# Patient Record
Sex: Female | Born: 1940
Health system: Southern US, Community
[De-identification: ages and names within clinical notes are randomized; demographics above are authoritative.]

## PROBLEM LIST (undated history)

## (undated) DIAGNOSIS — E785 Hyperlipidemia, unspecified: Secondary | ICD-10-CM

## (undated) DIAGNOSIS — N3289 Other specified disorders of bladder: Secondary | ICD-10-CM

## (undated) DIAGNOSIS — I10 Essential (primary) hypertension: Secondary | ICD-10-CM

## (undated) DIAGNOSIS — T8859XA Other complications of anesthesia, initial encounter: Secondary | ICD-10-CM

## (undated) DIAGNOSIS — C801 Malignant (primary) neoplasm, unspecified: Secondary | ICD-10-CM

## (undated) DIAGNOSIS — T4145XA Adverse effect of unspecified anesthetic, initial encounter: Secondary | ICD-10-CM

## (undated) DIAGNOSIS — M6530 Trigger finger, unspecified finger: Secondary | ICD-10-CM

## (undated) DIAGNOSIS — E039 Hypothyroidism, unspecified: Secondary | ICD-10-CM

## (undated) DIAGNOSIS — K219 Gastro-esophageal reflux disease without esophagitis: Secondary | ICD-10-CM

## (undated) DIAGNOSIS — Z973 Presence of spectacles and contact lenses: Secondary | ICD-10-CM

## (undated) DIAGNOSIS — H3530 Unspecified macular degeneration: Secondary | ICD-10-CM

## (undated) DIAGNOSIS — M1990 Unspecified osteoarthritis, unspecified site: Secondary | ICD-10-CM

## (undated) HISTORY — PX: TONSILLECTOMY: SUR1361

## (undated) HISTORY — PX: APPENDECTOMY: SHX54

## (undated) HISTORY — PX: CARPAL TUNNEL RELEASE: SHX101

## (undated) HISTORY — PX: OTHER SURGICAL HISTORY: SHX169

## (undated) HISTORY — PX: JOINT REPLACEMENT: SHX530

---

## 1987-09-07 HISTORY — PX: ABDOMINAL HYSTERECTOMY: SHX81

## 1992-09-06 HISTORY — PX: THYROIDECTOMY: SHX17

## 1995-09-07 HISTORY — PX: CHOLECYSTECTOMY: SHX55

## 2004-02-17 ENCOUNTER — Ambulatory Visit (HOSPITAL_COMMUNITY): Admission: RE | Admit: 2004-02-17 | Discharge: 2004-02-17 | Payer: Self-pay | Admitting: Family Medicine

## 2004-09-06 HISTORY — PX: TOTAL HIP ARTHROPLASTY: SHX124

## 2004-09-06 HISTORY — PX: CARDIAC CATHETERIZATION: SHX172

## 2005-02-11 ENCOUNTER — Ambulatory Visit: Admission: RE | Admit: 2005-02-11 | Discharge: 2005-02-11 | Payer: Self-pay | Admitting: Orthopedic Surgery

## 2005-02-16 ENCOUNTER — Ambulatory Visit (HOSPITAL_COMMUNITY): Admission: RE | Admit: 2005-02-16 | Discharge: 2005-02-16 | Payer: Self-pay | Admitting: Cardiology

## 2005-03-03 ENCOUNTER — Inpatient Hospital Stay (HOSPITAL_COMMUNITY): Admission: RE | Admit: 2005-03-03 | Discharge: 2005-03-06 | Payer: Self-pay | Admitting: Orthopedic Surgery

## 2005-03-04 ENCOUNTER — Ambulatory Visit: Payer: Self-pay | Admitting: Physical Medicine & Rehabilitation

## 2005-03-06 ENCOUNTER — Inpatient Hospital Stay
Admission: RE | Admit: 2005-03-06 | Discharge: 2005-03-17 | Payer: Self-pay | Admitting: Physical Medicine & Rehabilitation

## 2005-03-07 ENCOUNTER — Encounter (HOSPITAL_COMMUNITY)
Admission: RE | Admit: 2005-03-07 | Discharge: 2005-03-17 | Payer: Self-pay | Admitting: Physical Medicine & Rehabilitation

## 2005-06-07 ENCOUNTER — Ambulatory Visit: Payer: Self-pay | Admitting: Physical Medicine & Rehabilitation

## 2005-06-07 ENCOUNTER — Inpatient Hospital Stay (HOSPITAL_COMMUNITY): Admission: RE | Admit: 2005-06-07 | Discharge: 2005-06-10 | Payer: Self-pay | Admitting: Orthopedic Surgery

## 2005-06-10 ENCOUNTER — Inpatient Hospital Stay
Admission: RE | Admit: 2005-06-10 | Discharge: 2005-06-17 | Payer: Self-pay | Admitting: Physical Medicine & Rehabilitation

## 2005-06-11 ENCOUNTER — Encounter (HOSPITAL_COMMUNITY)
Admission: RE | Admit: 2005-06-11 | Discharge: 2005-06-17 | Payer: Self-pay | Admitting: Physical Medicine & Rehabilitation

## 2005-07-20 ENCOUNTER — Encounter (HOSPITAL_COMMUNITY): Admission: RE | Admit: 2005-07-20 | Discharge: 2005-08-19 | Payer: Self-pay | Admitting: Orthopedic Surgery

## 2005-08-24 ENCOUNTER — Encounter (HOSPITAL_COMMUNITY): Admission: RE | Admit: 2005-08-24 | Discharge: 2005-09-01 | Payer: Self-pay | Admitting: Orthopedic Surgery

## 2005-09-08 ENCOUNTER — Encounter (HOSPITAL_COMMUNITY): Admission: RE | Admit: 2005-09-08 | Discharge: 2005-09-08 | Payer: Self-pay | Admitting: Orthopedic Surgery

## 2005-11-08 ENCOUNTER — Ambulatory Visit (HOSPITAL_COMMUNITY): Admission: RE | Admit: 2005-11-08 | Discharge: 2005-11-08 | Payer: Self-pay | Admitting: Internal Medicine

## 2006-08-24 ENCOUNTER — Encounter (HOSPITAL_COMMUNITY): Admission: RE | Admit: 2006-08-24 | Discharge: 2006-09-05 | Payer: Self-pay | Admitting: Orthopedic Surgery

## 2006-09-07 ENCOUNTER — Encounter (HOSPITAL_COMMUNITY): Admission: RE | Admit: 2006-09-07 | Discharge: 2006-10-07 | Payer: Self-pay | Admitting: Orthopedic Surgery

## 2008-05-10 ENCOUNTER — Ambulatory Visit (HOSPITAL_COMMUNITY): Admission: RE | Admit: 2008-05-10 | Discharge: 2008-05-10 | Payer: Self-pay | Admitting: Family Medicine

## 2010-06-23 ENCOUNTER — Ambulatory Visit (HOSPITAL_COMMUNITY): Admission: RE | Admit: 2010-06-23 | Discharge: 2010-06-23 | Payer: Self-pay | Admitting: Family Medicine

## 2011-01-22 NOTE — Discharge Summary (Signed)
[name], [name]                ACCOUNT NO.:  [id number]   MEDICAL RECORD NO.:  [id number]          PATIENT TYPE:  INP   LOCATION:  1517                         FACILITY:  [facility]   PHYSICIAN:  Ollen Gross, M.D.    DATE OF BIRTH:  1941/02/25   DATE OF ADMISSION:  03/03/2005  DATE OF DISCHARGE:  03/06/2005                                 DISCHARGE SUMMARY   ADMISSION DIAGNOSIS:  1.  Osteoarthritis of the right hip.  2.  Hypertension.  3.  Hypothyroidism secondary to thyroidectomy.  4.  Hyperlipidemia.  5.  Basal cell skin carcinoma.  6.  Varicose veins.  7.  Hemorrhoids.  8.  Urinary incontinence.  9.  Osteoarthritis.  10. Post menopausal.  11. Uterine fibroids.  12. Benign cystic breast disease.   DISCHARGE DIAGNOSIS:  1.  Osteoarthritis of the right hip status post right total hip      arthroplasty.  2.  Mild postop blood loss anemia.  3.  Hypertension.  4.  Hypothyroidism secondary to thyroidectomy.  5.  Hyperlipidemia.  6.  Basal cell skin carcinoma.  7.  Varicose veins.  8.  Hemorrhoids.  9.  Urinary incontinence.  10. Osteoarthritis.  11. Post menopausal.  12. Uterine fibroids.  13. Benign cystic breast disease.   PROCEDURE:  Right total hip arthroplasty, surgeon Dr. Ollen Gross,  assistant Avel Peace, P.A.-C., anesthesia general, estimated blood loss  600 mL, Hemovac drain x 1.   BRIEF HISTORY:  [name] is a 70 year old female with severe end stage  arthritis of the right hip with intractable pain who now presents for total  hip.   HOSPITAL COURSE:  The patient was admitted to the hospital and underwent the  procedure, tolerated it well.  She was started on PC and p.o. analgesics for  pain control following surgery.  On day one, she did have some discomfort,  had already been sitting on the side of the bed.  Hemovac drain placed  during the surgery was pulled.  Rehab consult.  The patient was seen and  felt to be a perfect candidate for inpatient  rehab.  She was going to be  transferred at which time the bed was available.  She was starting to get up  with physical therapy.  By day two, she was already up to a chair and only  given short ambulation.  She ambulated just essentially bed to chair with  moderate assist.  The incision looked good, dressing was changed.  It was  decided she was a good candidate and she would be transferred when a bed  became available.  She continued to receive therapy and by day three, March 06, 2005, she was tolerating well, pain was under better control, she had  been weaned over to p.o. medications.  It was noted the bed became available  later that day and she was transferred over at that time.   DISCHARGE PLAN:  The patient was transferred to [facility].   DISCHARGE MEDICATIONS:  Continue current medications as per the [facility].  Continue previous diet.  Activities:  Partial weight-bearing right lower  extremity, continue gait training, ambulation, and ADLs as per PT and OT  while on rehab services.  May start showering four days after surgery.  Follow up two weeks from surgery, call the office for an appointment at [phone]  [phone], or after discharge from the [facility] unit.   DISPOSITION:  [place] [facility].   CONDITION ON DISCHARGE:  Improving.       ALP/MEDQ  D:  03/17/2005  T:  03/17/2005  Job:  [id number]   cc:   Ollen Gross, M.D.  [facility]  [address]  [address]  [place]  [place] [address]  Fax: [phone]   Ellwood Dense, M.D.  [address] [place]  [place] [address]  Fax: [phone]   Patrica Duel, M.D.  [address]  [place]  [place] [address]  Fax: [phone]   Darlin Priestly, MD  [phone] [address], [address]  [place]  [place] [address]  Fax: [phone]

## 2011-01-22 NOTE — H&P (Signed)
[name], [name]                ACCOUNT NO.:  [id number]   MEDICAL RECORD NO.:  [id number]          PATIENT TYPE:  INP   LOCATION:  NA                           FACILITY:  [facility]   PHYSICIAN:  Ollen Gross, M.D.    DATE OF BIRTH:  10/17/1940   DATE OF ADMISSION:  06/07/2005  DATE OF DISCHARGE:                                HISTORY & PHYSICAL   DATE OF OFFICE VISIT HISTORY AND PHYSICAL:  May 25, 2005   CHIEF COMPLAINT:  Left hip pain.   HISTORY OF PRESENT ILLNESS:  The patient is a 70 year old female well-known  to Dr. Ollen Gross, having previously undergone a right total hip  arthroplasty and had done extremely well, had known end-stage arthritis in  both hips and now presents for the opposite hip on the left side to be  completed.   ALLERGIES:  IV DYE, FELDENE, NSAIDS and VICODIN.   CURRENT MEDICATIONS:  Synthroid, atenolol, also vitamins including vitamin  C, vitamin E, beta carotene, calcium and glucosamine.   PAST MEDICAL HISTORY:  1.  Hypertension.  2.  Hypothyroidism secondary to thyroidectomy.  3.  Hyperlipidemia.  4.  Basal skin carcinoma.  5.  Varicose veins.  6.  Hemorrhoids.  7.  Urinary incontinence.  8.  Postmenopausal.  9.  Uterine fibroid.  10. Benign cystic breast disease.   PAST SURGICAL HISTORY:  1.  Tonsillectomy.  2.  Carpal tunnel surgery.  3.  Thyroidectomy.  4.  Gallbladder surgery.  5.  Right hip replacement in June of '06.  6.  Hysterectomy.   SOCIAL HISTORY:  She is single.  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   FAMILY HISTORY:  Father deceased at age 24 with a history of congestive  heart failure, heart and hypertension.  Mother with a history of  hypertension.  Father with a history of diverticulosis and arthritis with  both parents.   REVIEW OF SYSTEMS:  GENERAL:  No fevers, chills or night sweats.  NEUROLOGIC:  No seizures, syncope or paralysis.  RESPIRATORY:  No  shortness  of breath, productive cough or hemoptysis.  CARDIOVASCULAR:  No chest pain,  angina or orthopnea.  GI:  No nausea, vomiting, diarrhea or constipation.  GU:  A little bit of nocturia and frequency.  No dysuria, hematuria or  discharge.  MUSCULOSKELETAL:  Left hip as found in history of present  illness.   PHYSICAL EXAMINATION:  VITAL SIGNS:  Pulse 64, respirations 12, blood  pressure 120/60.  GENERAL:  A 70 year old white female, well-nourished, well-developed, in no  acute distress, overweight, alert, oriented and cooperative, very pleasant  at time of exam, and appears to be an excellent historian.  HEENT:  Normocephalic, atraumatic.  Pupils are round and reactive to light.  Oropharynx clear.  EOMs intact.  NECK:  Supple.  CHEST:  Clear, anterior and posterior chest wall, no rhonchi, rales or  wheezing.  HEART:  Regular rhythm. S1 and S2 noted.  ABDOMEN:  Soft and round abdomen.  Bowel sounds present.  Nontender.  RECTAL:  Not done, not pertinent to present illness.  BREASTS:  Not done, not pertinent to present illness.  GENITALIA:  Not done, not pertinent to present illness.  EXTREMITIES:  Left hip:  The left hip shows flexion to about 90 degrees.  There is no internal rotation and external rotation, only about 20 degrees  of abduction, slight analgesic gait.   IMPRESSION:  1.  Osteoarthritis of left hip.  2.  Hypertension.  3.  Hypothyroidism secondary to thyroidectomy.  4.  Hyperlipidemia.  5.  History of basal cell skin carcinoma.  6.  Varicose veins.  7.  Hemorrhoids.  8.  Urinary incontinence.  9.  Osteoarthritis.  10. Postmenopausal.  11. Uterine fibroid.  12. Benign cystic breast disease.   PLAN:  The patient will be admitted to [facility] to undergo a  left total hip replacement arthroplasty.  The patient has been seen  preoperatively by Dr. Patrica Duel and felt to be stable for up and coming  surgery.  The patient is subsequently admitted  to the hospital for  procedure.      [name], P.A.      Ollen Gross, M.D.  Electronically Signed    ALP/MEDQ  D:  06/06/2005  T:  06/07/2005  Job:  [id number]   cc:   Patrica Duel, M.D.  Fax: [phone]

## 2011-01-22 NOTE — H&P (Signed)
[name], [name]                ACCOUNT NO.:  [id number]   MEDICAL RECORD NO.:  [id number]          PATIENT TYPE:  INP   LOCATION:                               FACILITY:  [facility]   PHYSICIAN:  Ranelle Oyster, M.D.DATE OF BIRTH:  Feb 26, 1941   DATE OF ADMISSION:  03/06/2005  DATE OF DISCHARGE:                                HISTORY & PHYSICAL   CHIEF COMPLAINT:  Right hip pain.   HISTORY OF PRESENT ILLNESS:  This is a 70 year old white female admitted, on  March 03, 2005, with end stage arthritis in the right hip.  The patient had  elected to undergo a right total hip replacement which was performed on the  same day by Dr. Ollen Gross.  The patient was placed on Coumadin for DVT  prophylaxis and was weightbearing as tolerated after surgery.  The patient  was slow to progress forearm a functional standpoint and as of March 05, 2005, was mod assist with transfers and ambulating a few feet with a  standard walker using verbal cues.  It was felt that she could benefit from  an admission to the subacute rehab unit to achieve modified independent to  supervision goals.  No significant postoperative complications are noted in  the chart.   REVIEW OF SYSTEMS:  The patient denies any systemic complaints.  She has had  no problems with ears, nose, and throat, eyes, breathing, cardiovascular,  GU, skin, cognitive, or psychological issues.  She does complain of some  constipation.   PAST MEDICAL HISTORY:  1.  Hypertension.  2.  Hypothyroidism.  3.  Left carpal tunnel syndrome with release.  4.  Hysterectomy.  5.  Thyroidectomy.  6.  Cholecystectomy.  7.  History of coronary artery disease with PTCA.   The patient denies alcohol or tobacco use.   FAMILY HISTORY:  Noncontributory.   SOCIAL HISTORY:  The patient lives in a one-level home with a ramp.  She  lives with her mother in [place].  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  Mother and friends will be able to provide  some assistance at home,  after discharge there.  The patient was clearly independent with a cane  prior to arrival.   MEDICATIONS PRIOR TO ARRIVAL:  Aspirin, atenolol, and Synthroid.   ALLERGIES:  1.  VICODIN.  2.  SULFA.  3.  FELDENE/NSAID'S.  4.  NAPROXEN.  5.  CONTRAST MEDIA.   PHYSICAL EXAMINATION:  VITAL SIGNS:  Blood pressure is 94/50, pulse is 98,  respiratory rate is 20, temperature is 98.4.  GENERAL:  The patient is alert and oriented x 3.  EAR/NOSE/THROAT:  Unremarkable.  Oral mucosa is pink and moist.  NECK:  Supple without JVD or lymphadenopathy.  CHEST:  Clear to auscultation bilaterally.  HEART:  Regular rate and rhythm without murmurs, rubs, or gallops.  ABDOMEN:  Soft, nontender.  The patient is slightly obese.  SKIN:  Clean and intact.  Right hip wound has moderate serosanguineous  discharge but well approximated.  NEUROLOGIC:  Cranial nerves II-XII are grossly intact.  Reflexes  are 2+.  Sensation slightly decreased over the dorsum of the foot and the lateral  shin.  Judgment is appropriate.  The patient had good orientation and  memory.  EXTREMITIES:  The patient had trace to 1+ edema of the right lower extremity  today.  Strength was 5/5 both upper extremities.  Right lower extremity is  1+ to 2 out of 5 proximally, 3+ to 4 out of 5 distally.  Left lower  extremity  is 4 to 4+ out of 5 throughout.   ASSESSMENT/PLAN:  1.  Functional deficit secondary to osteoarthritis of the right hip, status      post right total hip replacement postoperative day number three.  Begin      subcu level rehabilitation to reach modified independent to supervision      goals.      1.  Estimated length of stay is 7-10 days.      2.  Prognosis is good.  2.  Deep vein thrombosis prophylaxis with Lovenox and Coumadin.  3.  Pain management with as needed Robaxin and Percocet.  4.  Hypothyroidism.  Continue Levothyroxine.  5.  Constipation.  Increase the patient's Senokot S.  Add  stool softeners      and laxatives as needed.  6.  Coronary history.  Continue atenolol 50 mg b.i.d.       ZTS/MEDQ  D:  03/05/2005  T:  03/05/2005  Job:  [id number]

## 2011-01-22 NOTE — H&P (Signed)
[name], [name]                ACCOUNT NO.:  [id number]   MEDICAL RECORD NO.:  [id number]          PATIENT TYPE:  INP   LOCATION:  NA                           FACILITY:  [facility]   PHYSICIAN:  Ollen Gross, M.D.    DATE OF BIRTH:  [birth year]   DATE OF ADMISSION:  02/15/2005  DATE OF DISCHARGE:                                HISTORY & PHYSICAL   CHIEF COMPLAINT:  Right hip pain.   HISTORY OF PRESENT ILLNESS:  A 70 year old female with known history of  bilateral knee and bilateral hip pain.  She has a longstanding history and  has quite dysfunction and gait abnormality for several years now.  She was  seen by a physician at [facility] and given some physical therapy for  some knee problems.  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  She spends a lot of time on mission trips in [place], where she  lives most of the time.  She has been told she has noticeable limp and does  quite a lot of walking.  She is seen in the office.  Her x-rays show that  she has moderate arthritic changes on the right and left still with some  preserved joint space and marginal osteophyte formation on the left.  Lateral views do not show any significant patella femoral involvement.  Both  hips, however, show severe end stage arthritis with deformity of the femoral  head and bone-on-bone on both sides with a large marginal osteophytes with  some collapse of the right femoral head.  It appears that her main problem  is mostly her hips and the right is more symptomatic than the left.  It is  felt due to her findings and symptoms that she would benefit from undergoing  hip replacement.  The risks and benefits discussed.  The patient is  subsequently admitted to the hospital.   ALLERGIES:  1.  FELDENE.  2.  VICODIN.  3.  NAPROXEN.  4.  SULFA DRUGS.   CURRENT MEDICATIONS:  1.  Atenolol 50 mg b.i.d.  2.  Synthroid 163 mcg daily.  3.  Vitamin C 1,000 mg daily.  4.  Vitamin E 1,000  international units daily.  5.  Betacarotene 25,000 international units.  6.  Calcium with vitamin D 500 mg twice a day.  7.  Glucosamine/chondroitin triple strength twice a day.  8.  Aspirin 81 mg daily.   PAST MEDICAL HISTORY:  1.  Hypertension.  2.  Hypothyroidism secondary to thyroidectomy.  3.  Hyperlipidemia.  4.  Basal cell skin carcinoma.  5.  Varicose veins.  6.  Hemorrhoids.  7.  Urinary incontinence.  8.  Osteoarthritis.  9.  Post menopausal.  10. Uterine fibroids.  11. Benign cystic breast disease.   PAST SURGICAL HISTORY:  1.  Tonsillectomy and adenoidectomy.  2.  Hysterectomy.  3.  Appendectomy.  4.  Carpal tunnel.  5.  Gallbladder.  6.  Thyroidectomy.  7.  Removal of basal cell carcinoma.   SOCIAL HISTORY:  Single.  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  Nonsmoker.  No alcohol.  No children.   FAMILY HISTORY:  Father deceased with a history of heart attack, age 60,  also with a history of hypertension and throat cancer.  Mother deceased  racing heart and also a history of hypertension.   REVIEW OF SYSTEMS:  GENERAL:  No fever, chills, night sweats.  NEURO:  No  seizures, syncope, paralysis.  RESPIRATORY:  A little bit of shortness of  breath on exertion.  No shortness of breath at rest, productive cough, or  hemoptysis.  CARDIOVASCULAR:  No chest pain, angina, orthopnea.  GI:  No  nausea, vomiting.  No blood or mucus in the stool.  GU:  She does have a  little bit of nocturia and frequency with some urgency, slight urinary  incontinence.  No dysuria, hematuria.  MUSCULOSKELETAL:  Right hip found in  the history of present illness.   PHYSICAL EXAMINATION:  VITAL SIGNS:  Pulse 72, respirations 12, blood  pressure 124/78.  GENERAL:  A 70 year old white female, well nourished, well developed, short  in stature, overweight, obese, no acute distress.  She is alert, oriented,  cooperative, very pleasant.  HEENT:  Normocephalic, atraumatic.  Pupils  round and reactive.  Oropharynx  is clear.  EOMs are intact.  NECK:  Supple.  No carotid bruits are appreciated.  CHEST:  Clear anterior posterior chest walls.  No rhonchi, rales, or  wheezing.  HEART:  Regular rhythm.  No murmur, S1 S2 noted.  ABDOMEN:  Soft, round, protuberant abdomen.  Bowel sounds present.  RECTAL/BREASTS/GENITALIA:  Not done.  Not pertinent to present illness.  EXTREMITIES:  Right hip:  Right hip flexion only shows 80 degrees.  There is  no internal rotation.  No external rotation.  No abduction.  Very limited  motion and function with right hip.   IMPRESSION:  Osteoarthritis, right hip.   PLAN:  The patient will be admitted to [facility] to undergo a  right total hip arthroplasty.  The patient has been seen preoperatively by  Dr. Jenne Campus, over at [facility], and felt to be stable  to undergo surgery.       ALP/MEDQ  D:  02/13/2005  T:  02/13/2005  Job:  [id number]   cc:   Patrica Duel, M.D.  [address]  [place]  [place] [address]  Fax: [phone]   Darlin Priestly, MD  [phone] [address], [address]  [place]  [place] [address]  Fax: [phone]   Ollen Gross, M.D.  [facility]  [address]  [address]  [place]  [place] [address]  Fax: [phone]

## 2011-01-22 NOTE — Cardiovascular Report (Signed)
[name], [name]                ACCOUNT NO.:  [id number]   MEDICAL RECORD NO.:  [id number]          PATIENT TYPE:  OIB   LOCATION:  2852                         FACILITY:  [facility]   PHYSICIAN:  Thereasa Solo. Little, M.D. DATE OF BIRTH:  1940-11-09   DATE OF PROCEDURE:  02/16/2005  DATE OF DISCHARGE:                              CARDIAC CATHETERIZATION   This 70 year old female underwent preop clearance for hip surgery. She had a  nuclear study performed that showed anterior apical ischemia. Because of  this, she is brought in for an outpatient cardiac catheterization.   After obtaining informed consent, the patient was prepped and draped in the  usual sterile fashion exposing the right groin. Following local anesthetic  of 1% Xylocaine, the Seldinger technique was employed, and  a 5-French  introducer sheath was placed in the right femoral artery. Left and right  coronary arteriography and ventriculography in the RAO projection was  performed.   COMPLICATIONS:  None.   TOTAL CONTRAST USED:  90 cc.   EQUIPMENT:  5-French Judkins _______________ catheters.   It took three attempts to enter the femoral artery. There was no hematoma  when the patient left the cath lab.   RESULTS:  1.  Hemodynamic monitoring:  Central aortic pressure was 186/92. Left      ventricular pressure was 190/20. At time of pullback, there was no      significant aortic valve gradient.  2.  Ventriculography. Ventriculography in the RAO projection revealed normal      LV systolic function. Ejection fraction was greater than 60%. Mitral      valve prolapse without mitral regurgitation was seen. Some ventricular      ectopy was seen during the ventriculogram. The left ventricular end-      diastolic pressure was 30.   CORONARY ARTERIOGRAPHY:  On fluoroscopy, no calcification was appreciated.   1.  Left main trifurcated and was free of disease.  2.  Circumflex. This gave rise to an OM vessel, and this system  was free of      disease.  3.  Optional diagonal. It bifurcated and was free of disease.  4.  LAD. The LAD extended down to the apex of the heart. There was a small      first diagonal branch and the system was free of disease.  5.  Right coronary artery. The right coronary gave rise to the PDA. The      system was free of disease.   CONCLUSION:  1.  Normal left ventricular systolic function  2.  Mitral valve prolapse.  3.  Systemic hypertension.  4.  No occlusive coronary disease.   DISCUSSION:  It appears that the stress test was false positive. This lady  is low risk for hip replacement. She is cleared for surgery, and we have  notified [facility] of the same.       ABL/MEDQ  D:  02/16/2005  T:  02/16/2005  Job:  [id number]   cc:   Jenne Campus, M.D.   Patrica Duel, M.D.  [address]  [place]  [place] [address]  Fax: [phone]   Ollen Gross, M.D.  [facility]  [address]  [address]  [place]  [place] [address]  Fax: [phone]

## 2011-01-22 NOTE — Op Note (Signed)
NAMETEMIMA, KUTSCH                ACCOUNT NO.:  [id number]   MEDICAL RECORD NO.:  [id number]          PATIENT TYPE:  INP   LOCATION:  0004                         FACILITY:  [facility]   PHYSICIAN:  Ollen Gross, M.D.    DATE OF BIRTH:  09-08-1940   DATE OF PROCEDURE:  03/03/2005  DATE OF DISCHARGE:                                 OPERATIVE REPORT   PREOPERATIVE DIAGNOSES:  Osteoarthritis right hip.   POSTOPERATIVE DIAGNOSES:  Osteoarthritis right hip.   PROCEDURE:  Right total hip arthroplasty.   SURGEON:  Ollen Gross, M.D.   ASSISTANT:  Avel Peace, P.A.-C.   ANESTHESIA:  General.   ESTIMATED BLOOD LOSS:  600.   DRAINS:  Hemovac x1.   COMPLICATIONS:  None.   CONDITION:  Stable to recovery.   BRIEF CLINICAL NOTE:  Ms. Suddeth is a 70 year old female with severe end-stage  osteoarthritis of the right hip with intractable pain. She presents now for  right total hip arthroplasty.   PROCEDURE IN DETAIL:  After successful administration of general anesthetic,  the patient is placed in the left lateral decubitus position with the right  side up and held with a hip positioner. The right lower extremity was  isolated from her perineum with plastic drapes and prepped and draped in the  usual sterile fashion. A standard posterolateral incision was made a 10  blade through the subcutaneous tissue to the level of fascia lata which was  incised in line with the skin incision. The sciatic nerve was palpated and  protected and the short external rotators isolated off the femur.  Capsulectomy was performed and the hip is dislocated. The center of the  femoral head is marked and a trial prosthesis placed such that the center of  the trial head corresponds to the center of her native femoral head.  Osteotomy line is marked on the femoral neck and osteotomy made with an  oscillating saw. Femoral head is removed and the femur retracted anteriorly  to gain acetabular exposure.   Acetabular  labrum and osteophytes are removed. Reaming starts at 45 mm  coursing in increments of 2 to 49 mm and then a 58 mm pinnacle acetabular  shell was placed in anatomic position and transfixed with two dome screws. A  trial 28 mm neutral liner was placed.   The femur was repaired first with the canal finder and then irrigation.  Axial reaming is performed to 3.5 mm, proximal reaming to an 18D and the  sleeve machined to a large. The 18D large trial sleeve is placed with an 18  x 13 stem and a 36 plus 8 neck matching her native anteversion. A 28 plus 0  head was placed. The hip is reduced with outstanding stability, full  extension, full external rotation, 70 degrees flexion, 40 degrees adduction,  90 degrees internal rotation and 90 degrees flexion, 70 degrees internal  rotation. When placing the right leg on top of the left, the lengths are  essentially equal. The hip was then dislocated and all trials were removed.  Permanent apex hole eliminator was placed into the  acetabular shell and then  the permanent 28 mm neutral Ultramet metal liner was placed. This is a metal-  on-metal hip replacement. Permanent 18D large sleeve is placed with the 18 x  13 stem and a 36 plus 8 neck. This matched her native anteversion. The  permanent 28 plus 0 head is placed and the hip is reduced with the same  stability parameters. The wound was copiously irrigated with saline solution  and the short rotators reattached to the femur through drill holes. The  fascia lata was closed over a Hemovac drain with interrupted #1 Vicryl,  subcu closed with #1 and then 2-0 Vicryl and subcuticular with running 4-0  Monocryl. Incisions cleaned and dried and Steri-Strips and a bulky sterile  dressing applied. Drains hooked to suction. She is placed into a knee  immobilizer, awakened and transported to recovery in stable condition.       FA/MEDQ  D:  03/03/2005  T:  03/03/2005  Job:  [id number]

## 2011-01-22 NOTE — Discharge Summary (Signed)
NAMESUHAILAH, Mccormick                ACCOUNT NO.:  [id number]   MEDICAL RECORD NO.:  [id number]          PATIENT TYPE:  INP   LOCATION:  1518                         FACILITY[facility]  [facility]   PHYSICIAN:  Ollen Gross, M.D.    DATE OF BIRTH:  [birth year]   DATE OF ADMISSION:  06/07/2005  DATE OF DISCHARGE:  06/10/2005                                 DISCHARGE SUMMARY   ADMITTING DIAGNOSES:  1.  Osteoarthritis, left hip.  2.  Hypertension.  3.  Hypothyroidism secondary to thyroidectomy.  4.  Hyperlipidemia.  5.  History of basal cell skin carcinoma.  6.  Varicose veins.  7.  Hemorrhoids.  8.  Urinary incontinence.  9.  Osteoarthritis.  10. Post menopausal.  11. Uterine fibroids.  12. Benign cyst breast disease.   DISCHARGE DIAGNOSES:  1.  Osteoarthritis - left hip, status post left total hip arthroplasty.  2.  Mild postoperative hyponatremia.  3.  Hypertension.  4.  Hypothyroidism secondary to thyroidectomy.  5.  Hyperlipidemia.  6.  History of basal cell skin carcinoma.  7.  Varicose veins.  8.  Hemorrhoids.  9.  Urinary incontinence.  10. Osteoarthritis.  11. Post menopausal.  12. Uterine fibroids.  13. Benign cyst breast disease.   DATE OF SURGERY:  June 07, 2005.   PROCEDURE:  Left total hip.   SURGEON:  Ollen Gross, M.D.   ASSISTANT:  Alexzandrew L. Perkins, P.A.C.   ANESTHESIA:  General.   Hemovac drain x1.   BRIEF HISTORY:  [name] is a 70 year old female with end stage arthritis of  the left hip with intractable pain.  Successful right total hip, now  presents for left total hip.   CONSULTATIONS:  Rehab services.   LABORATORY DATA:  Pre-op CBC, hemoglobin 13.4, hematocrit of 39.7, white  cell count normal, differential normal with the exception of elevated monos  of 13.  Post-op hemoglobin 11.3, last known H&H 10.9 and 31.7.  PT PTT 13.8  and 30 respectively.  Serial pro times followed, last known PT INR 17.7 and  1.4.  Chem panel on admission all  within normal limits.  Sodium dropped from  138 to 134.  Pre-op UA trace leukocyte esterase, 0-2 white cells, otherwise  negative.  Blood group type O negative.  Left hip films, pre-op, June 02, 2005, marked degenerative arthritic changes left hip.   HOSPITAL COURSE:  Admitted to [facility], tolerated procedure  well, later transferred to the recovery room orthopedic floor, seen by rehab  services postoperative, felt that she would be a candidate for SACU versus  rehab stay.  Day one, she was doing fairly well, wanted to go to rehab.  Hemovac drain was pulled.  Hemoglobin looked good.  Started getting up with  physical therapy.  By day two she was not feeling well, had some nausea that  past evening, given antiemetics with some benefit, have a very small bowel  movement.  Hemoglobin was 10.5.  Slowly progressed on physical therapy.  Ambulated approximately four feet and progressing slowly.  By day three, she  was doing a little  bit better.  The nausea had resolved.  She was tolerating  her meds.  It was noted a bed was available on the subacute unit.  She was  slowly progressing and felt to be an excellent candidate and felt due to  this reason she was transferred over at that time.   DISCHARGE PLAN:  The patient was discharged over to [facility] on  June 10, 2005.   DISCHARGE DIAGNOSES:  Please see above.   DISCHARGE MEDICATIONS:  Please continue current medications as per the [facility]  which will be sent over with the patient.   DIET:  Low sodium, low cholesterol diet.   ACTIVITY:  Partial weightbearing, 25-50% left lower extremity, gait  training, ambulation, ADLs, total hip precautions, total hip protocol.   FOLLOWUP:  Two weeks from surgery or following discharge from the subacute  unit.   DISPOSITION:  [facility] SACU.   CONDITION ON DISCHARGE:  Improving.      Alexzandrew L. Julien Girt, P.A.      Ollen Gross, M.D.  Electronically  Signed    ALP/MEDQ  D:  07/31/2005  T:  07/31/2005  Job:  [id number]   cc:   Ollen Gross, M.D.  Fax: [phone]   Patrica Duel, M.D.  Fax: [phone]

## 2011-01-22 NOTE — H&P (Signed)
Mccormick, Abigail                ACCOUNT NO.:  [id number]   MEDICAL RECORD NO.:  [id number]          PATIENT TYPE:  INP   LOCATION:  1517                         FACILITY:  [facility]   PHYSICIAN:  Ollen Gross, M.D.    DATE OF BIRTH:  Oct 28, 1940   DATE OF ADMISSION:  03/03/2005  DATE OF DISCHARGE:  03/06/2005                                HISTORY & PHYSICAL   ADDENDUM  [name] history is well documented in the previous dictated History and  Physical. She was originally going to be admitted on February 15, 2005 but had  to have some further workup which came about from her preoperative  evaluation. She has since undergone her workup and has been cleared from a  medical and cardiac standpoint. Since clearance has been obtained, she is  rescheduled and admitted for a right total hip on March 03, 2005. Please see  the previously-dictated History and Physical for medications, medical and  surgical history.   PHYSICAL EXAMINATION:  Unchanged.   IMPRESSION:  1.  Osteoarthritis right hip.  2.  Hypertension.  3.  Hypothyroidism secondary to thyroidectomy.  4.  Hyperlipidemia.  5.  Basal cell skin carcinoma.  6.  Varicose veins.  7.  Hemorrhoids.  8.  Urinary incontinence.  9.  Osteoarthritis.  10. Postmenopausal.  11. Uterine fibroids.  12. Benign cystic breast disease.   PLAN:  The patient is subsequently admitted to [facility] to undergo right  total hip arthroplasty. The patient is subsequently admitted for surgery by  Dr. Lequita Halt.       ALP/MEDQ  D:  03/17/2005  T:  03/17/2005  Job:  [id number]

## 2011-01-22 NOTE — Discharge Summary (Signed)
[name], [name]                ACCOUNT NO.:  [id number]   MEDICAL RECORD NO.:  [id number]          PATIENT TYPE:  ORB   LOCATION:  4529                         FACILITY:  [facility]   PHYSICIAN:  Ranelle Oyster, M.D.DATE OF BIRTH:  1940/12/05   DATE OF ADMISSION:  03/06/2005  DATE OF DISCHARGE:  03/17/2005                                 DISCHARGE SUMMARY   DISCHARGE DIAGNOSES:  1.  Right total hip arthroplasty secondary to osteoarthritis on June 28.  2.  Pain management.  3.  Coumadin for deep venous thrombosis prophylaxis.  4.  Postoperative anemia.  5.  Hypertension.  6.  Hypothyroidism.   HISTORY OF PRESENT ILLNESS:  This is a 70 year old female admitted to [facility] on June 28, with end-stage changes of the right hip and no  relief with conservative care.  She underwent a right total hip arthroplasty  on June 28, per Dr. Lequita Halt.  She was placed on Coumadin for deep venous  thrombosis prophylaxis and partial weightbearing.  Postoperative pain  control.  Hospital course uneventful.   PAST MEDICAL HISTORY:  See discharge diagnoses.   ALLERGIES:  FELDENE, VICODIN, NONSTEROIDAL ANTI-INFLAMMATORY DRUGS,  INTRAVENOUS CONTRAST, SULFA.   SOCIAL HISTORY:  Lives with elderly mother in [place].  She is a  IT sales professional traveler.  One-level home with a ramp.  Plan is to go home with  her mother.   MEDICATIONS:  Aspirin, Atenolol and Synthroid prior to admission.   HOSPITAL COURSE:  The patient did well while on rehabilitation services with  therapies initiated daily.  The following issues were followed during  patient's rehabilitation course:  Pertaining to Ms. Pettinger's total hip  arthroplasty, the surgical site is healing nicely with no signs of  infection.  She was ambulating with supervision level.  She remained on  Coumadin therapy for deep venous thrombosis prophylaxis.  She would complete  Coumadin protocol followed by [place].  Postoperative anemia remained  stable  with no bleeding episodes.  Her blood pressure is controlled with diastolic  pressures of 60-72.  She would follow up with her primary doctor, Dr.  Nobie Putnam.  She had no bowel or bladder disturbances.  She remained on her  Synthroid for hypothyroidism.  Overall, her strength and endurance greatly  improved.  She was encouraged with her overall progress and discharged to  home.   DISCHARGE MEDICATIONS:  1.  Coumadin with dose to be established at time of discharge to be      completed on April 02, 2005.  2.  Tenormin 50 mg twice daily.  3.  Synthroid 88 mcg plus 75 mcg daily.  4.  Oxycodone as needed for pain.  5.  Trinsicon capsule twice daily.   ACTIVITY:  As tolerated.   DIET:  Regular.   SPECIAL INSTRUCTIONS:  Continue therapies as per rehabilitation services.  Home health with Genevieve Norlander to complete Coumadin protocol.   FOLLOW UP:  Follow up with Dr. Homero Fellers Aluisio.       DA/MEDQ  D:  03/12/2005  T:  03/12/2005  Job:  [id number]   cc:  Patrica Duel, M.D.  [address], [address]  [place]  [place] [address]  Fax: [phone]

## 2011-01-22 NOTE — Op Note (Signed)
NAMESHYNIA, DALEO                ACCOUNT NO.:  [id number]   MEDICAL RECORD NO.:  [id number]          PATIENT TYPE:  INP   LOCATION:  NA                           FACILITY:  [facility]   PHYSICIAN:  Ollen Gross, M.D.    DATE OF BIRTH:  01/16/1941   DATE OF PROCEDURE:  06/07/2005  DATE OF DISCHARGE:                                 OPERATIVE REPORT   PREOPERATIVE DIAGNOSES:  Osteoarthritis, left hip.   POSTOPERATIVE DIAGNOSES:  Osteoarthritis, left hip.   PROCEDURE:  Left total hip arthroplasty.   SURGEON:  Ollen Gross, M.D.   ASSISTANT:  Avel Peace, P.A.-C.   ANESTHESIA:  General.   ESTIMATED BLOOD LOSS:  300.   DRAINS:  Hemovac x1.   COMPLICATIONS:  None.   CONDITION:  Stable to recovery.   BRIEF CLINICAL NOTE:  Ms. Barbaro is a 70 year old female with end-stage  arthritis of the left hip with intractable pain. She recently had a  successful right total hip arthroplasty and presents now for left total hip  arthroplasty.   DESCRIPTION OF PROCEDURE:  After successful administration of general  anesthetic, the patient was placed in the right lateral decubitus position  with the left side up and held with a hip positioner. The left lower  extremity was isolated from her perineum with plastic drapes and prepped and  draped in the usual sterile fashion. A standard posterolateral incision was  made with a 10 blade through the subcutaneous tissue to the level of the  fascia lata which was incised in line with the skin incision. The sciatic  nerve was palpated and protected and short external rotators isolated off  the femur. Capsulectomy  was performed and the hip is dislocated. A trial  prosthesis is placed such that the center of the trial head corresponds to  the center of her native femoral head. The osteotomy line is marked on the  femoral neck and osteotomy made with an oscillating saw. The femoral head is  removed and then the femur retracted anteriorly to gain  acetabular exposure.   Acetabular labrum and mass of osteophytes are removed. We began reaming at  45 coursing in increments of 2 up to 49 mm and then a 50 mm pinnacle  acetabular shell is placed in anatomic position and transfixed with two dome  screws each of which had excellent purchase. A trial 28 mm neutral liner was  placed.   The femur is prepared first with a canal finder and then irrigation. Axial  reaming is performed up to 13.5 mm, proximal reaming to an 18D and the  sleeve machined to a large. An 18D large trial sleeve is placed and an 18 x  13 stem and a 36 plus 8 neck. A trial 28 plus 0 head is placed. Her neck was  about 10 degrees retroverted in relationship to her native version. Her  native version was about 35 degrees. The hip is reduced with excellent  stability. She had full extension and full external rotation, 70 degrees  flexion, 40 degrees adduction, 90 degrees internal rotation, 90 degrees  flexion and  70 degrees of internal rotation. By placing the left leg on top  of the right, she was still a few millimeters short so we went a 28 plus 3  head which corrected that and still had great stability. All the trials were  then removed and the permanent apex hole eliminator was placed into the  acetabular shell. The permanent 28 mm neutral Ultamet metal liner was  placed. This is a metal on metal hip replacement. The 18D large  sleeve was  placed on the proximal femur with the 18 x 13 stem and a 36 plus 8 neck.  Once again about 10 degrees less version than her native version. The 28  plus 3 head was placed and the hip was reduced with the same stability  parameters. The wound was copiously irrigated with saline solution and short  rotators reattached to the femur through drill holes. The fascia lata was  closed over a hemovac drain with interrupted #1 Vicryl, subcu closed with #1  and 2-0 Vicryl, and subcuticular with running 4-0 Monocryl. The incision was  cleaned  and dried and Steri-Strips and a bulky sterile dressing applied. The  drain is hooked to suction and she is placed into a knee immobilizer,  awakened and transported to recovery in stable condition.      Ollen Gross, M.D.  Electronically Signed     FA/MEDQ  D:  06/07/2005  T:  06/07/2005  Job:  [id number]

## 2011-01-22 NOTE — Discharge Summary (Signed)
[name], [name]                ACCOUNT NO.:  [id number]   MEDICAL RECORD NO.:  [id number]          PATIENT TYPE:  ORB   LOCATION:  4528                         FACILITY:  [facility]   PHYSICIAN:  [name], M.D.   DATE OF BIRTH:  1940/12/16   DATE OF ADMISSION:  06/10/2005  DATE OF DISCHARGE:  06/17/2005                                 DISCHARGE SUMMARY   DISCHARGE DIAGNOSES:  1.  Left total hip arthroplasty secondary to osteoarthritis June 07, 2005.  2.  Pain management.  3.  Coumadin for deep venous thrombosis prophylaxis.  4.  Postoperative anemia.  5.  Hypertension.  6.  Hypothyroidism.  7.  History of a right total hip replacement in July of 2006.   HISTORY OF PRESENT ILLNESS:  A 70 year old female.  History of a right total  hip replacement in July of 2006.  Her subacute care rehabilitation services  July 1 to March 17, 2005.  Patient now admitted [facility] June 07, 2005 with end-stage changes of the left hip and no relief with  conservative care.  Underwent a left total hip arthroplasty October 2 per  Dr. Lequita Mccormick.  Placed on Coumadin for deep venous thrombosis prophylaxis 50%  partial weightbearing.  PCA morphine discontinued June 09, 2005.  She was  admitted to subacute care services.   PAST MEDICAL HISTORY:  See discharge diagnoses.   ALLERGIES:  NONSTEROIDAL ANTI-INFLAMMATORIES, SULFA, VICODIN, and CONTRAST  MEDIA.   SOCIAL HISTORY:  Lives with mother in [place].  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  Mother uses a cane and limited assistance.   MEDICATIONS PRIOR TO ADMISSION:  1.  Atenolol 50 mg twice daily.  2.  Synthroid.   HOSPITAL COURSE:  Patient with progressive gains while on rehabilitation  service with therapies initiated daily.  The following issues were followed  during patient's rehabilitation course.  Pertaining to [name] left  total hip arthroplasty secondary to osteoarthritis, surgical site healing  nicely.   Steri-Strips in place.  No signs of infection.  Partial  weightbearing with hip precautions.  She was presently minimal assist for  occupational therapy, needing minimal assist for lower body dressing,  supervision to minimal guard for ambulation.  Home health therapies had been  arranged.  She remained on oxycodone, Robaxin for pain management with good  results.  Coumadin for deep venous thrombosis prophylaxis with latest INR of  2.6.  She would complete Coumadin protocol followed by [facility]  Agency.  Postoperative anemia with hemoglobin 11.7.  She remained on her  hormone supplement for hypothyroidism.  No blood pressure issues during her  stay and she remained on her Tenormin.  No bowel or bladder disturbances.  She had a history of a right total hip replacement in July of 2006 of which  she received inpatient rehabilitation subacute services for this.  Again,  this was without issue during her rehabilitation stay.  She was discharged  in stable condition.   DISCHARGE MEDICATIONS:  1.  Coumadin, latest dose of 4 mg completed July 08, 2005.  2.  Synthroid 88 mcg plus 75 mcg daily.  3.  Tenormin 50 mg twice daily.  4.  Oxycodone as needed pain.   She is to follow up with Dr. Lequita Mccormick, orthopedic services, call for  appointment.  Dr. Patrica Mccormick, medical management.  A home health nurse  had been arranged to check INR on Monday, October 16 per [facility]  Agency.      [name], P.A.    ______________________________  [name], M.D.    DA/MEDQ  D:  06/16/2005  T:  06/16/2005  Job:  [id number]   cc:   [name], M.D.  Fax: [phone]   [name], M.D.  Fax: [phone]

## 2011-02-24 ENCOUNTER — Ambulatory Visit (INDEPENDENT_AMBULATORY_CARE_PROVIDER_SITE_OTHER): Payer: Medicare Other | Admitting: Internal Medicine

## 2011-02-24 DIAGNOSIS — R066 Hiccough: Secondary | ICD-10-CM

## 2011-09-29 DIAGNOSIS — N318 Other neuromuscular dysfunction of bladder: Secondary | ICD-10-CM | POA: Diagnosis not present

## 2011-10-11 DIAGNOSIS — C4441 Basal cell carcinoma of skin of scalp and neck: Secondary | ICD-10-CM | POA: Diagnosis not present

## 2011-10-11 DIAGNOSIS — D485 Neoplasm of uncertain behavior of skin: Secondary | ICD-10-CM | POA: Diagnosis not present

## 2011-10-11 DIAGNOSIS — C44319 Basal cell carcinoma of skin of other parts of face: Secondary | ICD-10-CM | POA: Diagnosis not present

## 2011-10-11 DIAGNOSIS — C4491 Basal cell carcinoma of skin, unspecified: Secondary | ICD-10-CM | POA: Diagnosis not present

## 2011-10-18 DIAGNOSIS — Z4802 Encounter for removal of sutures: Secondary | ICD-10-CM | POA: Diagnosis not present

## 2011-11-09 DIAGNOSIS — E039 Hypothyroidism, unspecified: Secondary | ICD-10-CM | POA: Diagnosis not present

## 2011-11-09 DIAGNOSIS — M25579 Pain in unspecified ankle and joints of unspecified foot: Secondary | ICD-10-CM | POA: Diagnosis not present

## 2011-11-09 DIAGNOSIS — Z6832 Body mass index (BMI) 32.0-32.9, adult: Secondary | ICD-10-CM | POA: Diagnosis not present

## 2011-12-01 DIAGNOSIS — Z9889 Other specified postprocedural states: Secondary | ICD-10-CM | POA: Diagnosis not present

## 2011-12-01 DIAGNOSIS — C44319 Basal cell carcinoma of skin of other parts of face: Secondary | ICD-10-CM | POA: Diagnosis not present

## 2011-12-07 DIAGNOSIS — L84 Corns and callosities: Secondary | ICD-10-CM | POA: Diagnosis not present

## 2011-12-07 DIAGNOSIS — IMO0002 Reserved for concepts with insufficient information to code with codable children: Secondary | ICD-10-CM | POA: Diagnosis not present

## 2011-12-07 DIAGNOSIS — B351 Tinea unguium: Secondary | ICD-10-CM | POA: Diagnosis not present

## 2012-01-13 DIAGNOSIS — I10 Essential (primary) hypertension: Secondary | ICD-10-CM | POA: Diagnosis not present

## 2012-01-13 DIAGNOSIS — E785 Hyperlipidemia, unspecified: Secondary | ICD-10-CM | POA: Diagnosis not present

## 2012-01-13 DIAGNOSIS — Z6832 Body mass index (BMI) 32.0-32.9, adult: Secondary | ICD-10-CM | POA: Diagnosis not present

## 2012-01-19 DIAGNOSIS — R920 Mammographic microcalcification found on diagnostic imaging of breast: Secondary | ICD-10-CM | POA: Diagnosis not present

## 2012-01-19 DIAGNOSIS — R928 Other abnormal and inconclusive findings on diagnostic imaging of breast: Secondary | ICD-10-CM | POA: Diagnosis not present

## 2012-01-19 DIAGNOSIS — N6019 Diffuse cystic mastopathy of unspecified breast: Secondary | ICD-10-CM | POA: Diagnosis not present

## 2012-02-05 DIAGNOSIS — Z6832 Body mass index (BMI) 32.0-32.9, adult: Secondary | ICD-10-CM | POA: Diagnosis not present

## 2012-02-05 DIAGNOSIS — J069 Acute upper respiratory infection, unspecified: Secondary | ICD-10-CM | POA: Diagnosis not present

## 2012-02-05 DIAGNOSIS — J029 Acute pharyngitis, unspecified: Secondary | ICD-10-CM | POA: Diagnosis not present

## 2012-03-15 DIAGNOSIS — B351 Tinea unguium: Secondary | ICD-10-CM | POA: Diagnosis not present

## 2012-03-15 DIAGNOSIS — L84 Corns and callosities: Secondary | ICD-10-CM | POA: Diagnosis not present

## 2012-07-03 DIAGNOSIS — L84 Corns and callosities: Secondary | ICD-10-CM | POA: Diagnosis not present

## 2012-07-03 DIAGNOSIS — B351 Tinea unguium: Secondary | ICD-10-CM | POA: Diagnosis not present

## 2012-07-06 DIAGNOSIS — M1710 Unilateral primary osteoarthritis, unspecified knee: Secondary | ICD-10-CM | POA: Diagnosis not present

## 2012-07-06 DIAGNOSIS — H04129 Dry eye syndrome of unspecified lacrimal gland: Secondary | ICD-10-CM | POA: Diagnosis not present

## 2012-07-06 DIAGNOSIS — M25559 Pain in unspecified hip: Secondary | ICD-10-CM | POA: Diagnosis not present

## 2012-07-14 DIAGNOSIS — Z23 Encounter for immunization: Secondary | ICD-10-CM | POA: Diagnosis not present

## 2012-07-18 DIAGNOSIS — E785 Hyperlipidemia, unspecified: Secondary | ICD-10-CM | POA: Diagnosis not present

## 2012-07-18 DIAGNOSIS — E559 Vitamin D deficiency, unspecified: Secondary | ICD-10-CM | POA: Diagnosis not present

## 2012-07-18 DIAGNOSIS — E039 Hypothyroidism, unspecified: Secondary | ICD-10-CM | POA: Diagnosis not present

## 2012-07-18 DIAGNOSIS — Z6829 Body mass index (BMI) 29.0-29.9, adult: Secondary | ICD-10-CM | POA: Diagnosis not present

## 2012-07-18 DIAGNOSIS — I10 Essential (primary) hypertension: Secondary | ICD-10-CM | POA: Diagnosis not present

## 2012-07-19 DIAGNOSIS — M19019 Primary osteoarthritis, unspecified shoulder: Secondary | ICD-10-CM | POA: Diagnosis not present

## 2012-07-26 DIAGNOSIS — N6019 Diffuse cystic mastopathy of unspecified breast: Secondary | ICD-10-CM | POA: Diagnosis not present

## 2012-07-26 DIAGNOSIS — R928 Other abnormal and inconclusive findings on diagnostic imaging of breast: Secondary | ICD-10-CM | POA: Diagnosis not present

## 2012-08-15 DIAGNOSIS — J343 Hypertrophy of nasal turbinates: Secondary | ICD-10-CM | POA: Diagnosis not present

## 2012-08-15 DIAGNOSIS — Z7182 Exercise counseling: Secondary | ICD-10-CM | POA: Diagnosis not present

## 2012-08-15 DIAGNOSIS — Z713 Dietary counseling and surveillance: Secondary | ICD-10-CM | POA: Diagnosis not present

## 2012-08-15 DIAGNOSIS — R05 Cough: Secondary | ICD-10-CM | POA: Diagnosis not present

## 2012-09-21 DIAGNOSIS — L821 Other seborrheic keratosis: Secondary | ICD-10-CM | POA: Diagnosis not present

## 2012-09-21 DIAGNOSIS — D235 Other benign neoplasm of skin of trunk: Secondary | ICD-10-CM | POA: Diagnosis not present

## 2012-09-21 DIAGNOSIS — I781 Nevus, non-neoplastic: Secondary | ICD-10-CM | POA: Diagnosis not present

## 2012-09-21 DIAGNOSIS — Z85828 Personal history of other malignant neoplasm of skin: Secondary | ICD-10-CM | POA: Diagnosis not present

## 2012-10-16 DIAGNOSIS — N318 Other neuromuscular dysfunction of bladder: Secondary | ICD-10-CM | POA: Diagnosis not present

## 2012-10-24 DIAGNOSIS — G609 Hereditary and idiopathic neuropathy, unspecified: Secondary | ICD-10-CM | POA: Diagnosis not present

## 2012-10-24 DIAGNOSIS — B351 Tinea unguium: Secondary | ICD-10-CM | POA: Diagnosis not present

## 2012-10-24 DIAGNOSIS — L84 Corns and callosities: Secondary | ICD-10-CM | POA: Diagnosis not present

## 2012-11-30 DIAGNOSIS — Z6830 Body mass index (BMI) 30.0-30.9, adult: Secondary | ICD-10-CM | POA: Diagnosis not present

## 2012-11-30 DIAGNOSIS — Z7189 Other specified counseling: Secondary | ICD-10-CM | POA: Diagnosis not present

## 2013-01-22 DIAGNOSIS — B351 Tinea unguium: Secondary | ICD-10-CM | POA: Diagnosis not present

## 2013-02-05 DIAGNOSIS — R928 Other abnormal and inconclusive findings on diagnostic imaging of breast: Secondary | ICD-10-CM | POA: Diagnosis not present

## 2013-04-23 DIAGNOSIS — IMO0002 Reserved for concepts with insufficient information to code with codable children: Secondary | ICD-10-CM | POA: Diagnosis not present

## 2013-04-23 DIAGNOSIS — G609 Hereditary and idiopathic neuropathy, unspecified: Secondary | ICD-10-CM | POA: Diagnosis not present

## 2013-04-23 DIAGNOSIS — L851 Acquired keratosis [keratoderma] palmaris et plantaris: Secondary | ICD-10-CM | POA: Diagnosis not present

## 2013-05-08 ENCOUNTER — Other Ambulatory Visit (HOSPITAL_COMMUNITY): Payer: Self-pay | Admitting: Family Medicine

## 2013-05-08 ENCOUNTER — Ambulatory Visit (HOSPITAL_COMMUNITY)
Admission: RE | Admit: 2013-05-08 | Discharge: 2013-05-08 | Disposition: A | Discharge status: Home / Self Care | Payer: Medicare Other | Source: Ambulatory Visit | Attending: Family Medicine | Admitting: Family Medicine

## 2013-05-08 DIAGNOSIS — IMO0002 Reserved for concepts with insufficient information to code with codable children: Secondary | ICD-10-CM | POA: Insufficient documentation

## 2013-05-08 DIAGNOSIS — S0990XA Unspecified injury of head, initial encounter: Secondary | ICD-10-CM

## 2013-05-08 DIAGNOSIS — R51 Headache: Secondary | ICD-10-CM

## 2013-05-08 DIAGNOSIS — R42 Dizziness and giddiness: Secondary | ICD-10-CM | POA: Diagnosis not present

## 2013-05-08 DIAGNOSIS — W1809XA Striking against other object with subsequent fall, initial encounter: Secondary | ICD-10-CM | POA: Insufficient documentation

## 2013-05-08 DIAGNOSIS — R55 Syncope and collapse: Secondary | ICD-10-CM | POA: Insufficient documentation

## 2013-05-08 DIAGNOSIS — Z6830 Body mass index (BMI) 30.0-30.9, adult: Secondary | ICD-10-CM | POA: Diagnosis not present

## 2013-05-17 DIAGNOSIS — L570 Actinic keratosis: Secondary | ICD-10-CM | POA: Diagnosis not present

## 2013-05-17 DIAGNOSIS — D1801 Hemangioma of skin and subcutaneous tissue: Secondary | ICD-10-CM | POA: Diagnosis not present

## 2013-05-17 DIAGNOSIS — L820 Inflamed seborrheic keratosis: Secondary | ICD-10-CM | POA: Diagnosis not present

## 2013-05-24 DIAGNOSIS — J330 Polyp of nasal cavity: Secondary | ICD-10-CM | POA: Diagnosis not present

## 2013-05-24 DIAGNOSIS — J342 Deviated nasal septum: Secondary | ICD-10-CM | POA: Diagnosis not present

## 2013-06-21 ENCOUNTER — Encounter (HOSPITAL_BASED_OUTPATIENT_CLINIC_OR_DEPARTMENT_OTHER): Payer: Self-pay | Admitting: *Deleted

## 2013-06-25 ENCOUNTER — Encounter (HOSPITAL_BASED_OUTPATIENT_CLINIC_OR_DEPARTMENT_OTHER): Payer: Self-pay | Admitting: *Deleted

## 2013-06-25 ENCOUNTER — Encounter (HOSPITAL_COMMUNITY)
Admission: RE | Admit: 2013-06-25 | Discharge: 2013-06-25 | Disposition: A | Discharge status: Home / Self Care | Payer: Medicare Other | Source: Ambulatory Visit | Attending: Otolaryngology | Admitting: Otolaryngology

## 2013-06-25 DIAGNOSIS — Z85828 Personal history of other malignant neoplasm of skin: Secondary | ICD-10-CM | POA: Diagnosis not present

## 2013-06-25 DIAGNOSIS — I10 Essential (primary) hypertension: Secondary | ICD-10-CM | POA: Diagnosis not present

## 2013-06-25 DIAGNOSIS — Z01810 Encounter for preprocedural cardiovascular examination: Secondary | ICD-10-CM | POA: Diagnosis not present

## 2013-06-25 DIAGNOSIS — E079 Disorder of thyroid, unspecified: Secondary | ICD-10-CM | POA: Diagnosis not present

## 2013-06-25 DIAGNOSIS — Z01812 Encounter for preprocedural laboratory examination: Secondary | ICD-10-CM | POA: Diagnosis not present

## 2013-06-25 DIAGNOSIS — H9190 Unspecified hearing loss, unspecified ear: Secondary | ICD-10-CM | POA: Diagnosis not present

## 2013-06-25 DIAGNOSIS — Z954 Presence of other heart-valve replacement: Secondary | ICD-10-CM | POA: Diagnosis not present

## 2013-06-25 DIAGNOSIS — J330 Polyp of nasal cavity: Secondary | ICD-10-CM | POA: Diagnosis not present

## 2013-06-25 DIAGNOSIS — J342 Deviated nasal septum: Secondary | ICD-10-CM | POA: Diagnosis not present

## 2013-06-25 LAB — BASIC METABOLIC PANEL
BUN: 21 mg/dL (ref 6–23)
CO2: 30 mEq/L (ref 19–32)
Chloride: 103 mEq/L (ref 96–112)
Creatinine, Ser: 0.71 mg/dL (ref 0.50–1.10)
GFR calc Af Amer: >90 mL/min (ref >90)
GFR calc non Af Amer: 85 mL/min — ABNORMAL LOW (ref >90)
Glucose, Bld: 87 mg/dL (ref 70–99)
Potassium: 5.2 mEq/L — ABNORMAL HIGH (ref 3.5–5.1)

## 2013-06-25 NOTE — Progress Notes (Signed)
To go to [facility] for [id number] 

## 2013-06-26 ENCOUNTER — Encounter (HOSPITAL_BASED_OUTPATIENT_CLINIC_OR_DEPARTMENT_OTHER): Payer: Self-pay | Admitting: *Deleted

## 2013-06-26 ENCOUNTER — Encounter (HOSPITAL_BASED_OUTPATIENT_CLINIC_OR_DEPARTMENT_OTHER): Payer: Medicare Other | Admitting: Anesthesiology

## 2013-06-26 ENCOUNTER — Ambulatory Visit (HOSPITAL_BASED_OUTPATIENT_CLINIC_OR_DEPARTMENT_OTHER)
Admission: RE | Admit: 2013-06-26 | Discharge: 2013-06-26 | Disposition: A | Discharge status: Home / Self Care | Payer: Medicare Other | Source: Ambulatory Visit | Attending: Otolaryngology | Admitting: Otolaryngology

## 2013-06-26 ENCOUNTER — Ambulatory Visit (HOSPITAL_BASED_OUTPATIENT_CLINIC_OR_DEPARTMENT_OTHER): Payer: Medicare Other | Admitting: Anesthesiology

## 2013-06-26 ENCOUNTER — Encounter (HOSPITAL_BASED_OUTPATIENT_CLINIC_OR_DEPARTMENT_OTHER)
Admission: RE | Disposition: A | Discharge status: Home / Self Care | Payer: Self-pay | Source: Ambulatory Visit | Attending: Otolaryngology

## 2013-06-26 DIAGNOSIS — I10 Essential (primary) hypertension: Secondary | ICD-10-CM | POA: Insufficient documentation

## 2013-06-26 DIAGNOSIS — Z01810 Encounter for preprocedural cardiovascular examination: Secondary | ICD-10-CM | POA: Insufficient documentation

## 2013-06-26 DIAGNOSIS — Z01812 Encounter for preprocedural laboratory examination: Secondary | ICD-10-CM | POA: Diagnosis not present

## 2013-06-26 DIAGNOSIS — J330 Polyp of nasal cavity: Secondary | ICD-10-CM | POA: Diagnosis not present

## 2013-06-26 DIAGNOSIS — H9190 Unspecified hearing loss, unspecified ear: Secondary | ICD-10-CM | POA: Insufficient documentation

## 2013-06-26 DIAGNOSIS — J329 Chronic sinusitis, unspecified: Secondary | ICD-10-CM | POA: Diagnosis not present

## 2013-06-26 DIAGNOSIS — E079 Disorder of thyroid, unspecified: Secondary | ICD-10-CM | POA: Insufficient documentation

## 2013-06-26 DIAGNOSIS — J342 Deviated nasal septum: Secondary | ICD-10-CM | POA: Diagnosis not present

## 2013-06-26 DIAGNOSIS — Z9889 Other specified postprocedural states: Secondary | ICD-10-CM

## 2013-06-26 DIAGNOSIS — Z85828 Personal history of other malignant neoplasm of skin: Secondary | ICD-10-CM | POA: Insufficient documentation

## 2013-06-26 DIAGNOSIS — J339 Nasal polyp, unspecified: Secondary | ICD-10-CM | POA: Diagnosis not present

## 2013-06-26 DIAGNOSIS — Z954 Presence of other heart-valve replacement: Secondary | ICD-10-CM | POA: Insufficient documentation

## 2013-06-26 HISTORY — DX: Hyperlipidemia, unspecified: E78.5

## 2013-06-26 HISTORY — PX: SEPTOPLASTY: SHX2393

## 2013-06-26 HISTORY — DX: Unspecified osteoarthritis, unspecified site: M19.90

## 2013-06-26 HISTORY — DX: Hypothyroidism, unspecified: E03.9

## 2013-06-26 HISTORY — DX: Adverse effect of unspecified anesthetic, initial encounter: T41.45XA

## 2013-06-26 HISTORY — DX: Essential (primary) hypertension: I10

## 2013-06-26 HISTORY — PX: POLYPECTOMY: SHX149

## 2013-06-26 HISTORY — DX: Other complications of anesthesia, initial encounter: T88.59XA

## 2013-06-26 HISTORY — DX: Presence of spectacles and contact lenses: Z97.3

## 2013-06-26 SURGERY — POLYPECTOMY, NASAL CAVITY
Anesthesia: General | Site: Nose | Wound class: Clean Contaminated

## 2013-06-26 MED ORDER — DEXAMETHASONE SODIUM PHOSPHATE 4 MG/ML IJ SOLN
INTRAMUSCULAR | Status: DC | PRN
  Administered 2013-06-26: 5 mg via INTRAVENOUS

## 2013-06-26 MED ORDER — SUCCINYLCHOLINE CHLORIDE 20 MG/ML IJ SOLN
INTRAMUSCULAR | Status: DC | PRN
  Administered 2013-06-26: 100 mg via INTRAVENOUS

## 2013-06-26 MED ORDER — EPHEDRINE SULFATE 50 MG/ML IJ SOLN
INTRAMUSCULAR | Status: DC | PRN
  Administered 2013-06-26: 10 mg via INTRAVENOUS
  Administered 2013-06-26: 5 mg via INTRAVENOUS

## 2013-06-26 MED ORDER — OXYCODONE-ACETAMINOPHEN 5-325 MG PO TABS
ORAL_TABLET | ORAL | Status: AC
  Filled 2013-06-26: qty 1

## 2013-06-26 MED ORDER — OXYCODONE-ACETAMINOPHEN 5-325 MG PO TABS
1.0000 | ORAL_TABLET | ORAL | Status: DC | PRN
  Administered 2013-06-26: 1 via ORAL

## 2013-06-26 MED ORDER — BACITRACIN ZINC 500 UNIT/GM EX OINT
TOPICAL_OINTMENT | CUTANEOUS | Status: AC
  Filled 2013-06-26: qty 28.35

## 2013-06-26 MED ORDER — LIDOCAINE HCL (CARDIAC) 20 MG/ML IV SOLN
INTRAVENOUS | Status: DC | PRN
  Administered 2013-06-26: 60 mg via INTRAVENOUS

## 2013-06-26 MED ORDER — MIDAZOLAM HCL 2 MG/2ML IJ SOLN: 1.0000 mg | INTRAMUSCULAR | Status: DC | PRN

## 2013-06-26 MED ORDER — AMOXICILLIN 875 MG PO TABS: 875.0000 mg | ORAL_TABLET | Freq: Two times a day (BID) | ORAL | Status: DC

## 2013-06-26 MED ORDER — FENTANYL CITRATE 0.05 MG/ML IJ SOLN: 50.0000 ug | INTRAMUSCULAR | Status: DC | PRN

## 2013-06-26 MED ORDER — FENTANYL CITRATE 0.05 MG/ML IJ SOLN
INTRAMUSCULAR | Status: DC | PRN
  Administered 2013-06-26 (×2): 50 ug via INTRAVENOUS

## 2013-06-26 MED ORDER — PROPOFOL 10 MG/ML IV BOLUS
INTRAVENOUS | Status: DC | PRN
  Administered 2013-06-26: 100 mg via INTRAVENOUS

## 2013-06-26 MED ORDER — LIDOCAINE-EPINEPHRINE 1 %-1:100000 IJ SOLN
INTRAMUSCULAR | Status: DC | PRN
  Administered 2013-06-26: 4.5 mL

## 2013-06-26 MED ORDER — OXYMETAZOLINE HCL 0.05 % NA SOLN
NASAL | Status: AC
  Filled 2013-06-26: qty 15

## 2013-06-26 MED ORDER — FENTANYL CITRATE 0.05 MG/ML IJ SOLN
INTRAMUSCULAR | Status: AC
  Filled 2013-06-26: qty 4

## 2013-06-26 MED ORDER — OXYMETAZOLINE HCL 0.05 % NA SOLN
NASAL | Status: DC | PRN
  Administered 2013-06-26: 1 via NASAL

## 2013-06-26 MED ORDER — CEFAZOLIN SODIUM-DEXTROSE 2-3 GM-% IV SOLR
INTRAVENOUS | Status: DC | PRN
  Administered 2013-06-26: 2 g via INTRAVENOUS

## 2013-06-26 MED ORDER — LACTATED RINGERS IV SOLN
INTRAVENOUS | Status: DC
  Administered 2013-06-26: 20 mL/h via INTRAVENOUS
  Administered 2013-06-26: 10:00:00 via INTRAVENOUS

## 2013-06-26 MED ORDER — MIDAZOLAM HCL 2 MG/2ML IJ SOLN
INTRAMUSCULAR | Status: AC
  Filled 2013-06-26: qty 2

## 2013-06-26 MED ORDER — FENTANYL CITRATE 0.05 MG/ML IJ SOLN: 25.0000 ug | INTRAMUSCULAR | Status: DC | PRN

## 2013-06-26 MED ORDER — ONDANSETRON HCL 4 MG/2ML IJ SOLN
4.0000 mg | Freq: Four times a day (QID) | INTRAMUSCULAR | Status: AC | PRN
  Administered 2013-06-26: 4 mg via INTRAVENOUS

## 2013-06-26 MED ORDER — OXYCODONE-ACETAMINOPHEN 5-325 MG PO TABS: 1.0000 | ORAL_TABLET | ORAL | Status: DC | PRN

## 2013-06-26 MED ORDER — BACITRACIN ZINC 500 UNIT/GM EX OINT
TOPICAL_OINTMENT | CUTANEOUS | Status: DC | PRN
  Administered 2013-06-26: 1 via TOPICAL

## 2013-06-26 MED ORDER — LIDOCAINE-EPINEPHRINE 1 %-1:100000 IJ SOLN
INTRAMUSCULAR | Status: AC
  Filled 2013-06-26: qty 1

## 2013-06-26 SURGICAL SUPPLY — 39 items
ATTRACTOMAT 16X20 MAGNETIC DRP (DRAPES) IMPLANT
BLADE SURG 15 STRL LF DISP TIS (BLADE) IMPLANT
BLADE SURG 15 STRL SS (BLADE)
CANISTER SUCT 1200ML W/VALVE (MISCELLANEOUS) ×3 IMPLANT
CATH SINUS GUIDE F-70 (CATHETERS) IMPLANT
COAGULATOR SUCT 8FR VV (MISCELLANEOUS) ×2 IMPLANT
COAGULATOR SUCT SWTCH 10FR 6 (ELECTROSURGICAL) IMPLANT
DECANTER SPIKE VIAL GLASS SM (MISCELLANEOUS) IMPLANT
DRSG NASOPORE 8CM (GAUZE/BANDAGES/DRESSINGS) IMPLANT
DRSG TELFA 3X8 NADH (GAUZE/BANDAGES/DRESSINGS) IMPLANT
ELECT REM PT RETURN 9FT ADLT (ELECTROSURGICAL) ×3
ELECTRODE REM PT RTRN 9FT ADLT (ELECTROSURGICAL) ×2 IMPLANT
GLOVE BIO SURGEON STRL SZ7.5 (GLOVE) ×3 IMPLANT
GLOVE SURG SS PI 7.0 STRL IVOR (GLOVE) ×1 IMPLANT
GOWN PREVENTION PLUS XLARGE (GOWN DISPOSABLE) ×6 IMPLANT
MARKER SKIN DUAL TIP RULER LAB (MISCELLANEOUS) IMPLANT
NDL HYPO 25X1 1.5 SAFETY (NEEDLE) ×2 IMPLANT
NEEDLE HYPO 25X1 1.5 SAFETY (NEEDLE) IMPLANT
NS IRRIG 1000ML POUR BTL (IV SOLUTION) ×3 IMPLANT
PACK BASIN DAY SURGERY FS (CUSTOM PROCEDURE TRAY) ×3 IMPLANT
PACK ENT DAY SURGERY (CUSTOM PROCEDURE TRAY) ×3 IMPLANT
PAD DRESSING TELFA 3X8 NADH (GAUZE/BANDAGES/DRESSINGS) IMPLANT
SCRUB TECHNI CARE SURGICAL (MISCELLANEOUS) IMPLANT
SET EXT MALE ROTATING LL 32IN (MISCELLANEOUS) IMPLANT
SET IV EXT TUBING FEMALE 31 (MISCELLANEOUS) IMPLANT
SLEEVE SCD COMPRESS KNEE MED (MISCELLANEOUS) ×1 IMPLANT
SOLUTION BUTLER CLEAR DIP (MISCELLANEOUS) ×5 IMPLANT
SPLINT NASAL DOYLE BI-VL (GAUZE/BANDAGES/DRESSINGS) ×3 IMPLANT
SPONGE GAUZE 2X2 8PLY STRL LF (GAUZE/BANDAGES/DRESSINGS) ×3 IMPLANT
SPONGE NEURO XRAY DETECT 1X3 (DISPOSABLE) ×3 IMPLANT
SUT CHROMIC 4 0 P 3 18 (SUTURE) ×3 IMPLANT
SUT PLAIN 4 0 ~~LOC~~ 1 (SUTURE) ×3 IMPLANT
SUT PROLENE 3 0 PS 2 (SUTURE) ×3 IMPLANT
SUT VIC AB 4-0 P-3 18XBRD (SUTURE) IMPLANT
SUT VIC AB 4-0 P3 18 (SUTURE)
TOWEL OR 17X24 6PK STRL BLUE (TOWEL DISPOSABLE) ×3 IMPLANT
TUBE SALEM SUMP 12R W/ARV (TUBING) IMPLANT
TUBE SALEM SUMP 16 FR W/ARV (TUBING) ×2 IMPLANT
YANKAUER SUCT BULB TIP NO VENT (SUCTIONS) ×3 IMPLANT

## 2013-06-26 NOTE — Op Note (Signed)
NAMESHILA, KRUCZEK                ACCOUNT NO.:  [id number]  MEDICAL RECORD NO.:  [id number]  LOCATION:                                 FACILITY:  PHYSICIAN:  Newman Pies, MD            DATE OF BIRTH:  1941-04-02  DATE OF PROCEDURE:  06/26/2013 DATE OF DISCHARGE:                              OPERATIVE REPORT   SURGEON:  Newman Pies, MD  PREOPERATIVE DIAGNOSES: 1. Bilateral nasal polyps. 2. Severe right nasal septal deviation. 3. Nasal obstruction.  POSTOPERATIVE DIAGNOSES: 1. Bilateral nasal polyps. 2. Severe right nasal septal deviation. 3. Nasal obstruction.  PROCEDURES PERFORMED: 1. Bilateral endoscopic nasal polypectomy. 2. Septoplasty.  ANESTHESIA:  General endotracheal tube anesthesia.  COMPLICATIONS:  None.  ESTIMATED BLOOD LOSS:  Less than 20 mL.  INDICATION FOR PROCEDURE:  The patient is a 72 year old female who recently underwent head CT scan to evaluate her head trauma.  The CT showed large right nasal polyps.  The patient also has a history of severe nasal septal deviation and chronic nasal obstruction.  She was a habitual mouth breather.  On endoscopic evaluation, the patient was noted to have large bilateral nasal polyps.  The polyps appeared to be connected via the nasopharynx.  Her nasal septum was severely deviated to the right.  Based on the above findings, the decision was made for the patient to undergo the above-stated procedures.  The risks, benefits, alternatives, and details of the procedures were discussed with the patient.  Questions were invited and answered.  Informed consent was obtained.  DESCRIPTION OF PROCEDURE:  The patient was taken to the operating room and placed supine on the operating table.  General endotracheal tube anesthesia was administered by the anesthesiologist.  Preop IV antibiotics were given.  She was prepped and draped in a standard fashion for nasal surgery.  Endoscopic evaluation of both nasal cavities revealed large  nasal polyps on the posterior aspect of the nasal cavity bilaterally.  The right nasal septum was also severely deviated to the right with a large septal spur.  Attention was first focused on the polypectomy procedure.  Using a 0-degree endoscope, the left nasal polyp was first approached.  The large segment of the nasal polyp was removed with a Blakesley forceps. The polyp appears to connect to the left nasal polyp via the nasopharynx.  The endoscope was then withdrawn and reinserted into the right nasal cavity.  The remaining large portion of the nasal polyp was then removed with the Blakesley forceps.  The entire specimen was sent to the Pathology Department for permanent histologic identification.  No other suspicious mass lesion was noted.  Attention was then focused on the septoplasty portion of the case.  A 1% lidocaine with 1:100,000 epinephrine was injected onto the nasal septum bilaterally.  Standard hemitransfixion incision was made on the left side.  The mucosal flap was elevated on the left side in a standard fashion.  A cartilaginous incision was then made 1 cm superior to the caudal margin of the septum.  The contralateral mucosal flap was also elevated.  The deviated portion of the cartilaginous and bony nasal septum were removed.  The cartilage was morselized and replaced.  The septum was quilted with 4-0 plain gut sutures.  The hemitransfixion incision was closed with interrupted chromic sutures.  Bilateral Doyle splints were then applied and secured in place with a 2-0 Prolene suture.  That concluded procedure for the patient.  The care of the patient was turned over to the anesthesiologist.  The patient was awakened from anesthesia without difficulty.  She was transferred to the recovery room in good condition.  OPERATIVE FINDINGS: 1. A large nasal polyp was noted in both nasal cavities.  The polyp     was actually connected through the nasopharynx. 2. Severe nasal  septal deviation to the right.  SPECIMEN:  Bilateral nasal polyps.  FOLLOWUP CARE:  The patient will be discharged home once she is awake and alert.  She will follow up in my office in 3 days for splint removal.  She will be placed on Percocet p.r.n. pain and amoxicillin for 5 days.     Newman Pies, MD     ST/MEDQ  D:  06/26/2013  T:  06/26/2013  Job:  [id number]  cc:   Corrie Mckusick, M.D.

## 2013-06-26 NOTE — H&P (Signed)
Cc: Nasal polyp, nasal obstruction  HPI: The patient is a 72 y/o female who presents today for evaluation of nasal polyp. The patient is seen in consultation requested by Dr. Assunta Found.  The patient recently sustained a fall and underwent a head CT.  A large right nasal polyp was noted and follow up was recommended.  According to the patient, she does have a known history of septal deviation and has noted off and on problems with nasal congestion. She admits to being a mouth breather.  The patient previously had issues with recurrent sinus infections but has not noted any recent problems.  The patient denies facial pain, pressure, or visual changes.  She has noted some recent episodes of dizziness.  The patient did have thyroidectomy and T&A in the past.    The patient's review of systems (constitutional, eyes, ENT, cardiovascular, respiratory, GI, musculoskeletal, skin, neurologic, psychiatric, endocrine, hematologic, allergic) is noted in the ROS questionnaire.  It is reviewed with the patient.    Past Medical History (Major events, hospitalizations, surgeries):  Tonsillectomy, Gallbladder removed, Hysterectomy, Bilateral hip replacement, Thyroid removed, Left carpal tunnel release.     Known allergies: Sulfa, Feldene, Quinine, HCTZ.     Ongoing medical problems: Tinnitis, Hearing loss, Hypertension, Dermatitis, Skin cancer, Thyroid disease, Cataracts,.     Family medical history: None.     Social history: The patient is single.  She denies the use of tobacco, alcohol or illegal drugs.  Exam: General: Communicates without difficulty, well nourished, no acute distress.  Head: Normocephalic, no evidence injury, no tenderness, facial buttresses intact without stepoff.  Eyes: PERRL, EOMI. No scleral icterus, conjunctivae clear.  Neuro: CN II exam reveals vision grossly intact.  No nystagmus at any point of gaze.  Ears: Auricles well formed without lesions.  Ear canals are intact without mass or  lesion.  No erythema or edema is appreciated.  The TMs are intact without fluid.  Nose: External evaluation reveals normal support and skin without lesions.  Dorsum is intact.  Anterior rhinoscopy reveals congested and edematous mucosa over anterior aspect of the inferior turbinates and nasal septum.  No purulence is noted. Middle meatus is not well visualized.  Nasal septum: Rightward deviation but intact.  Oral:  Oral cavity and oropharynx are intact, symmetric, without erythema or edema.  Mucosa is moist without lesions.  Neck: Full range of motion without pain.  There is no significant lymphadenopathy.  No masses palpable.  Thyroid bed within normal limits to palpation.  Parotid glands and submandibular glands equal bilaterally without mass.  Trachea is midline.  Neuro:  CN 2-12 grossly intact. Gait normal.  Vestibular: No nystagmus at any point of gaze.    Procedure:  Flexible Nasal Endoscopy: Risks, benefits, and alternatives of flexible endoscopy were explained to the patient.  Specific mention was made of the risk of throat numbness with difficulty swallowing, possible bleeding from the nose and mouth, and pain from the procedure.  The patient gave oral consent to proceed.  The nasal cavities were decongested and anesthetised with a combination of oxymetazoline and 4% lidocaine solution.  The flexible scope was inserted into the right nasal cavity.  Endoscopy of the inferior and middle meatus was performed.  The edematous mucosa was as described above.  A large polyp was noted to fill the right nasal cavity. No  mass or lesion was appreciated.  Olfactory cleft was clear.  Nasopharynx was filled with the polyp.   Turbinates were hypertrophied but without mass.  The procedure was repeated on the contralateral side with similar findings. The polyp was noted to extend around into the left nasal cavity. The patient tolerated the procedure well.  Instructions were given to avoid eating or drinking for 2  hours.  Assessment: 1.  Large right nasal polyp with extension into the nasopharynx and left nasal cavity.  No purulent drainage or other suspicious mass or lesion is noted on today's nasal endoscopy. 2.  Significant right septal deviation.  Plan: 1.  The physical exam, nasal endoscopy, and CT findings are discussed with the patient at length.  2.  The patient will benefit from undergoing surgical excision of the nasal polyp along with septoplasty.  The risks, benefits, and details of the treatment modalities are discussed. 3.  The patient would like to proceed with the procedure.  This will be scheduled in accordance with the patient's schedule. 4.  Flonase 2 sprays each nostril daily.

## 2013-06-26 NOTE — Brief Op Note (Signed)
06/26/2013  9:38 AM  PATIENT:  Maren Beach  72 y.o. female  PRE-OPERATIVE DIAGNOSIS:  BILATERAL NASAL POLYPS, DEVIATED SEPTUM, CHRONIC NASAL OBSTRUCTION  POST-OPERATIVE DIAGNOSIS:  BILATERAL NASAL POLYPS, DEVIATED SEPTUM, CHRONIC NASAL OBSTRUCTION   PROCEDURE:  Procedure(s): 1) BILATERAL ENDOSCOPIC EXCISION OF NASAL POLYPS  (Bilateral) 2) SEPTOPLASTY  SURGEON:  Surgeon(s) and Role:    * Sui W Karson Chicas, MD - Primary  PHYSICIAN ASSISTANT:   ASSISTANTS: none   ANESTHESIA:   general  EBL:  Total I/O In: 200 [I.V.:200] Out: -   BLOOD ADMINISTERED:none  DRAINS: none   LOCAL MEDICATIONS USED:  LIDOCAINE   SPECIMEN:  Source of Specimen:  Bilateral nasal polyps  DISPOSITION OF SPECIMEN:  PATHOLOGY  COUNTS:  YES  TOURNIQUET:  * No tourniquets in log *  DICTATION: .Other Dictation: Dictation Number [id number]  PLAN OF CARE: Discharge to home after PACU  PATIENT DISPOSITION:  PACU - hemodynamically stable.   Delay start of Pharmacological VTE agent (>24hrs) due to surgical blood loss or risk of bleeding: not applicable

## 2013-06-26 NOTE — Anesthesia Postprocedure Evaluation (Signed)
  Anesthesia Post-op Note  Patient: [name]  Procedure(s) Performed: Procedure(s): BILATERAL EXCISION OF NASAL POLYPS  (Bilateral) AND SEPTOPLASTY (N/A)  Patient Location: PACU  Anesthesia Type:General  Level of Consciousness: awake  Airway and Oxygen Therapy: Patient Spontanous Breathing  Post-op Pain: mild  Post-op Assessment: Post-op Vital signs reviewed  Post-op Vital Signs: Reviewed  Complications: No apparent anesthesia complications

## 2013-06-26 NOTE — Transfer of Care (Signed)
Immediate Anesthesia Transfer of Care Note  Patient: [name]  Procedure(s) Performed: Procedure(s): BILATERAL EXCISION OF NASAL POLYPS  (Bilateral) AND SEPTOPLASTY (N/A)  Patient Location: PACU  Anesthesia Type:General  Level of Consciousness: sedated  Airway & Oxygen Therapy: Patient Spontanous Breathing and Patient connected to face mask oxygen  Post-op Assessment: Report given to PACU RN and Post -op Vital signs reviewed and stable  Post vital signs: Reviewed and stable  Complications: No apparent anesthesia complications

## 2013-06-26 NOTE — Anesthesia Preprocedure Evaluation (Signed)
Anesthesia Evaluation  Patient identified by MRN, date of birth, ID band Patient awake    Reviewed: Allergy & Precautions, H&P , NPO status , Patient's Chart, lab work & pertinent test results  Airway Mallampati: II  Neck ROM: full    Dental   Pulmonary          Cardiovascular hypertension,     Neuro/Psych    GI/Hepatic   Endo/Other  Hypothyroidism obese  Renal/GU      Musculoskeletal  (+) Arthritis -,   Abdominal   Peds  Hematology   Anesthesia Other Findings   Reproductive/Obstetrics                           Anesthesia Physical Anesthesia Plan  ASA: II  Anesthesia Plan: General   Post-op Pain Management:    Induction: Intravenous  Airway Management Planned: Oral ETT  Additional Equipment:   Intra-op Plan:   Post-operative Plan: Extubation in OR  Informed Consent: I have reviewed the patients History and Physical, chart, labs and discussed the procedure including the risks, benefits and alternatives for the proposed anesthesia with the patient or authorized representative who has indicated his/her understanding and acceptance.     Plan Discussed with: CRNA, Anesthesiologist and Surgeon  Anesthesia Plan Comments:         Anesthesia Quick Evaluation

## 2013-06-28 ENCOUNTER — Encounter (HOSPITAL_BASED_OUTPATIENT_CLINIC_OR_DEPARTMENT_OTHER): Payer: Self-pay | Admitting: Otolaryngology

## 2013-07-10 DIAGNOSIS — H2510 Age-related nuclear cataract, unspecified eye: Secondary | ICD-10-CM | POA: Diagnosis not present

## 2013-07-10 DIAGNOSIS — H353190 Nonexudative age-related macular degeneration, unspecified eye, stage unspecified: Secondary | ICD-10-CM | POA: Diagnosis not present

## 2013-07-12 DIAGNOSIS — J330 Polyp of nasal cavity: Secondary | ICD-10-CM | POA: Diagnosis not present

## 2013-07-20 DIAGNOSIS — Z23 Encounter for immunization: Secondary | ICD-10-CM | POA: Diagnosis not present

## 2013-07-26 DIAGNOSIS — B351 Tinea unguium: Secondary | ICD-10-CM | POA: Diagnosis not present

## 2013-07-26 DIAGNOSIS — L851 Acquired keratosis [keratoderma] palmaris et plantaris: Secondary | ICD-10-CM | POA: Diagnosis not present

## 2013-08-27 DIAGNOSIS — N6019 Diffuse cystic mastopathy of unspecified breast: Secondary | ICD-10-CM | POA: Diagnosis not present

## 2013-08-27 DIAGNOSIS — R928 Other abnormal and inconclusive findings on diagnostic imaging of breast: Secondary | ICD-10-CM | POA: Diagnosis not present

## 2013-10-11 ENCOUNTER — Ambulatory Visit (INDEPENDENT_AMBULATORY_CARE_PROVIDER_SITE_OTHER): Payer: Medicare Other | Admitting: Otolaryngology

## 2013-10-11 DIAGNOSIS — J330 Polyp of nasal cavity: Secondary | ICD-10-CM | POA: Diagnosis not present

## 2013-10-22 DIAGNOSIS — E785 Hyperlipidemia, unspecified: Secondary | ICD-10-CM | POA: Diagnosis not present

## 2013-10-22 DIAGNOSIS — Z6831 Body mass index (BMI) 31.0-31.9, adult: Secondary | ICD-10-CM | POA: Diagnosis not present

## 2013-10-22 DIAGNOSIS — Z713 Dietary counseling and surveillance: Secondary | ICD-10-CM | POA: Diagnosis not present

## 2013-10-22 DIAGNOSIS — I10 Essential (primary) hypertension: Secondary | ICD-10-CM | POA: Diagnosis not present

## 2013-11-05 DIAGNOSIS — M24673 Ankylosis, unspecified ankle: Secondary | ICD-10-CM | POA: Diagnosis not present

## 2013-11-05 DIAGNOSIS — B351 Tinea unguium: Secondary | ICD-10-CM | POA: Diagnosis not present

## 2013-11-05 DIAGNOSIS — IMO0002 Reserved for concepts with insufficient information to code with codable children: Secondary | ICD-10-CM | POA: Diagnosis not present

## 2013-11-05 DIAGNOSIS — M24676 Ankylosis, unspecified foot: Secondary | ICD-10-CM | POA: Diagnosis not present

## 2013-11-08 ENCOUNTER — Ambulatory Visit (INDEPENDENT_AMBULATORY_CARE_PROVIDER_SITE_OTHER): Payer: Medicare Other | Admitting: Otolaryngology

## 2013-11-08 DIAGNOSIS — J330 Polyp of nasal cavity: Secondary | ICD-10-CM

## 2013-11-08 DIAGNOSIS — J310 Chronic rhinitis: Secondary | ICD-10-CM | POA: Diagnosis not present

## 2014-01-10 DIAGNOSIS — H2589 Other age-related cataract: Secondary | ICD-10-CM | POA: Diagnosis not present

## 2014-01-10 DIAGNOSIS — H5210 Myopia, unspecified eye: Secondary | ICD-10-CM | POA: Diagnosis not present

## 2014-01-10 DIAGNOSIS — H2510 Age-related nuclear cataract, unspecified eye: Secondary | ICD-10-CM | POA: Diagnosis not present

## 2014-01-10 DIAGNOSIS — H52229 Regular astigmatism, unspecified eye: Secondary | ICD-10-CM | POA: Diagnosis not present

## 2014-02-25 DIAGNOSIS — R928 Other abnormal and inconclusive findings on diagnostic imaging of breast: Secondary | ICD-10-CM | POA: Diagnosis not present

## 2014-02-25 DIAGNOSIS — R922 Inconclusive mammogram: Secondary | ICD-10-CM | POA: Diagnosis not present

## 2014-02-25 DIAGNOSIS — N6019 Diffuse cystic mastopathy of unspecified breast: Secondary | ICD-10-CM | POA: Diagnosis not present

## 2014-04-11 DIAGNOSIS — R3915 Urgency of urination: Secondary | ICD-10-CM | POA: Diagnosis not present

## 2014-05-02 DIAGNOSIS — M25579 Pain in unspecified ankle and joints of unspecified foot: Secondary | ICD-10-CM | POA: Diagnosis not present

## 2014-05-08 DIAGNOSIS — E781 Pure hyperglyceridemia: Secondary | ICD-10-CM | POA: Diagnosis not present

## 2014-05-08 DIAGNOSIS — Z6831 Body mass index (BMI) 31.0-31.9, adult: Secondary | ICD-10-CM | POA: Diagnosis not present

## 2014-05-08 DIAGNOSIS — I10 Essential (primary) hypertension: Secondary | ICD-10-CM | POA: Diagnosis not present

## 2014-05-08 DIAGNOSIS — Z0000 Encounter for general adult medical examination without abnormal findings: Secondary | ICD-10-CM | POA: Diagnosis not present

## 2014-05-08 DIAGNOSIS — E039 Hypothyroidism, unspecified: Secondary | ICD-10-CM | POA: Diagnosis not present

## 2014-05-16 ENCOUNTER — Ambulatory Visit (INDEPENDENT_AMBULATORY_CARE_PROVIDER_SITE_OTHER): Payer: Medicare Other | Admitting: Otolaryngology

## 2014-05-16 DIAGNOSIS — J330 Polyp of nasal cavity: Secondary | ICD-10-CM

## 2014-05-16 DIAGNOSIS — J310 Chronic rhinitis: Secondary | ICD-10-CM

## 2014-06-17 DIAGNOSIS — E6609 Other obesity due to excess calories: Secondary | ICD-10-CM | POA: Diagnosis not present

## 2014-06-17 DIAGNOSIS — M19011 Primary osteoarthritis, right shoulder: Secondary | ICD-10-CM | POA: Diagnosis not present

## 2014-06-17 DIAGNOSIS — Z6832 Body mass index (BMI) 32.0-32.9, adult: Secondary | ICD-10-CM | POA: Diagnosis not present

## 2014-06-26 DIAGNOSIS — I872 Venous insufficiency (chronic) (peripheral): Secondary | ICD-10-CM | POA: Diagnosis not present

## 2014-06-26 DIAGNOSIS — L821 Other seborrheic keratosis: Secondary | ICD-10-CM | POA: Diagnosis not present

## 2014-06-26 DIAGNOSIS — I781 Nevus, non-neoplastic: Secondary | ICD-10-CM | POA: Diagnosis not present

## 2014-06-26 DIAGNOSIS — L259 Unspecified contact dermatitis, unspecified cause: Secondary | ICD-10-CM | POA: Diagnosis not present

## 2014-07-09 DIAGNOSIS — H25813 Combined forms of age-related cataract, bilateral: Secondary | ICD-10-CM | POA: Diagnosis not present

## 2014-07-09 DIAGNOSIS — H04123 Dry eye syndrome of bilateral lacrimal glands: Secondary | ICD-10-CM | POA: Diagnosis not present

## 2014-07-09 DIAGNOSIS — H43813 Vitreous degeneration, bilateral: Secondary | ICD-10-CM | POA: Diagnosis not present

## 2014-07-18 DIAGNOSIS — Z96642 Presence of left artificial hip joint: Secondary | ICD-10-CM | POA: Diagnosis not present

## 2014-07-18 DIAGNOSIS — M1711 Unilateral primary osteoarthritis, right knee: Secondary | ICD-10-CM | POA: Diagnosis not present

## 2014-07-18 DIAGNOSIS — M1712 Unilateral primary osteoarthritis, left knee: Secondary | ICD-10-CM | POA: Diagnosis not present

## 2014-07-18 DIAGNOSIS — Z96641 Presence of right artificial hip joint: Secondary | ICD-10-CM | POA: Diagnosis not present

## 2014-07-19 DIAGNOSIS — Z23 Encounter for immunization: Secondary | ICD-10-CM | POA: Diagnosis not present

## 2014-08-09 DIAGNOSIS — E6609 Other obesity due to excess calories: Secondary | ICD-10-CM | POA: Diagnosis not present

## 2014-08-09 DIAGNOSIS — E039 Hypothyroidism, unspecified: Secondary | ICD-10-CM | POA: Diagnosis not present

## 2014-08-09 DIAGNOSIS — Z6833 Body mass index (BMI) 33.0-33.9, adult: Secondary | ICD-10-CM | POA: Diagnosis not present

## 2014-08-09 DIAGNOSIS — N3281 Overactive bladder: Secondary | ICD-10-CM | POA: Diagnosis not present

## 2014-08-22 DIAGNOSIS — B351 Tinea unguium: Secondary | ICD-10-CM | POA: Diagnosis not present

## 2014-09-09 DIAGNOSIS — I10 Essential (primary) hypertension: Secondary | ICD-10-CM | POA: Diagnosis not present

## 2014-09-09 DIAGNOSIS — R928 Other abnormal and inconclusive findings on diagnostic imaging of breast: Secondary | ICD-10-CM | POA: Diagnosis not present

## 2014-09-09 DIAGNOSIS — M25561 Pain in right knee: Secondary | ICD-10-CM | POA: Diagnosis not present

## 2014-09-09 DIAGNOSIS — N6012 Diffuse cystic mastopathy of left breast: Secondary | ICD-10-CM | POA: Diagnosis not present

## 2014-09-09 DIAGNOSIS — R350 Frequency of micturition: Secondary | ICD-10-CM | POA: Diagnosis not present

## 2014-09-09 DIAGNOSIS — Z01810 Encounter for preprocedural cardiovascular examination: Secondary | ICD-10-CM | POA: Diagnosis not present

## 2014-09-09 DIAGNOSIS — Z6832 Body mass index (BMI) 32.0-32.9, adult: Secondary | ICD-10-CM | POA: Diagnosis not present

## 2014-09-17 DIAGNOSIS — Z681 Body mass index (BMI) 19 or less, adult: Secondary | ICD-10-CM | POA: Diagnosis not present

## 2014-09-17 DIAGNOSIS — J069 Acute upper respiratory infection, unspecified: Secondary | ICD-10-CM | POA: Diagnosis not present

## 2014-10-25 DIAGNOSIS — R351 Nocturia: Secondary | ICD-10-CM | POA: Diagnosis not present

## 2014-10-25 DIAGNOSIS — R350 Frequency of micturition: Secondary | ICD-10-CM | POA: Diagnosis not present

## 2014-10-25 DIAGNOSIS — N3946 Mixed incontinence: Secondary | ICD-10-CM | POA: Diagnosis not present

## 2014-11-07 DIAGNOSIS — R351 Nocturia: Secondary | ICD-10-CM | POA: Diagnosis not present

## 2014-11-07 DIAGNOSIS — R350 Frequency of micturition: Secondary | ICD-10-CM | POA: Diagnosis not present

## 2014-11-07 DIAGNOSIS — N3946 Mixed incontinence: Secondary | ICD-10-CM | POA: Diagnosis not present

## 2014-11-15 ENCOUNTER — Other Ambulatory Visit (HOSPITAL_COMMUNITY): Payer: Self-pay | Admitting: Family Medicine

## 2014-11-15 DIAGNOSIS — M8580 Other specified disorders of bone density and structure, unspecified site: Secondary | ICD-10-CM | POA: Diagnosis not present

## 2014-11-15 DIAGNOSIS — E039 Hypothyroidism, unspecified: Secondary | ICD-10-CM | POA: Diagnosis not present

## 2014-11-15 DIAGNOSIS — Z6833 Body mass index (BMI) 33.0-33.9, adult: Secondary | ICD-10-CM | POA: Diagnosis not present

## 2014-11-15 DIAGNOSIS — E6609 Other obesity due to excess calories: Secondary | ICD-10-CM | POA: Diagnosis not present

## 2014-11-18 DIAGNOSIS — M1711 Unilateral primary osteoarthritis, right knee: Secondary | ICD-10-CM | POA: Diagnosis not present

## 2014-11-18 DIAGNOSIS — N3946 Mixed incontinence: Secondary | ICD-10-CM | POA: Diagnosis not present

## 2014-11-18 DIAGNOSIS — R278 Other lack of coordination: Secondary | ICD-10-CM | POA: Diagnosis not present

## 2014-11-18 DIAGNOSIS — M6281 Muscle weakness (generalized): Secondary | ICD-10-CM | POA: Diagnosis not present

## 2014-11-18 DIAGNOSIS — M62838 Other muscle spasm: Secondary | ICD-10-CM | POA: Diagnosis not present

## 2014-11-19 ENCOUNTER — Ambulatory Visit (HOSPITAL_COMMUNITY)
Admission: RE | Admit: 2014-11-19 | Discharge: 2014-11-19 | Disposition: A | Discharge status: Home / Self Care | Payer: Medicare Other | Source: Ambulatory Visit | Attending: Family Medicine | Admitting: Family Medicine

## 2014-11-19 DIAGNOSIS — R29890 Loss of height: Secondary | ICD-10-CM | POA: Insufficient documentation

## 2014-11-19 DIAGNOSIS — Z780 Asymptomatic menopausal state: Secondary | ICD-10-CM | POA: Diagnosis not present

## 2014-11-19 DIAGNOSIS — M8588 Other specified disorders of bone density and structure, other site: Secondary | ICD-10-CM | POA: Diagnosis not present

## 2014-11-19 DIAGNOSIS — M8580 Other specified disorders of bone density and structure, unspecified site: Secondary | ICD-10-CM | POA: Insufficient documentation

## 2014-11-21 ENCOUNTER — Ambulatory Visit: Payer: Self-pay | Admitting: Orthopedic Surgery

## 2014-11-21 NOTE — Progress Notes (Signed)
Preoperative surgical orders have been place into the Epic hospital system for [name] on 11/21/2014, 2:19 PM  by Mickel Crow for surgery on 12-13-2014.  Preop Total Knee orders including Experal, IV Tylenol, and IV Decadron as long as there are no contraindications to the above medications. Arlee Muslim, PA-C

## 2014-11-28 DIAGNOSIS — M6281 Muscle weakness (generalized): Secondary | ICD-10-CM | POA: Diagnosis not present

## 2014-11-28 DIAGNOSIS — M62838 Other muscle spasm: Secondary | ICD-10-CM | POA: Diagnosis not present

## 2014-11-28 DIAGNOSIS — N3946 Mixed incontinence: Secondary | ICD-10-CM | POA: Diagnosis not present

## 2014-11-28 DIAGNOSIS — R278 Other lack of coordination: Secondary | ICD-10-CM | POA: Diagnosis not present

## 2014-12-06 ENCOUNTER — Encounter (HOSPITAL_COMMUNITY)
Admission: RE | Admit: 2014-12-06 | Discharge: 2014-12-06 | Disposition: A | Discharge status: Home / Self Care | Payer: Medicare Other | Source: Ambulatory Visit | Attending: Orthopedic Surgery | Admitting: Orthopedic Surgery

## 2014-12-06 ENCOUNTER — Encounter (HOSPITAL_COMMUNITY): Payer: Self-pay

## 2014-12-06 DIAGNOSIS — Z01812 Encounter for preprocedural laboratory examination: Secondary | ICD-10-CM | POA: Insufficient documentation

## 2014-12-06 DIAGNOSIS — M62838 Other muscle spasm: Secondary | ICD-10-CM | POA: Diagnosis not present

## 2014-12-06 DIAGNOSIS — M6281 Muscle weakness (generalized): Secondary | ICD-10-CM | POA: Diagnosis not present

## 2014-12-06 DIAGNOSIS — R278 Other lack of coordination: Secondary | ICD-10-CM | POA: Diagnosis not present

## 2014-12-06 DIAGNOSIS — N3946 Mixed incontinence: Secondary | ICD-10-CM | POA: Diagnosis not present

## 2014-12-06 HISTORY — DX: Malignant (primary) neoplasm, unspecified: C80.1

## 2014-12-06 HISTORY — DX: Other specified disorders of bladder: N32.89

## 2014-12-06 HISTORY — DX: Trigger finger, unspecified finger: M65.30

## 2014-12-06 HISTORY — DX: Gastro-esophageal reflux disease without esophagitis: K21.9

## 2014-12-06 LAB — COMPREHENSIVE METABOLIC PANEL
ALK PHOS: 81 U/L (ref 39–117)
ALT: 20 U/L (ref 0–35)
AST: 21 U/L (ref 0–37)
Albumin: 3.9 g/dL (ref 3.5–5.2)
Anion gap: 8 (ref 5–15)
BUN: 22 mg/dL (ref 6–23)
CALCIUM: 9.7 mg/dL (ref 8.4–10.5)
CO2: 27 mmol/L (ref 19–32)
Chloride: 106 mmol/L (ref 96–112)
Creatinine, Ser: 0.64 mg/dL (ref 0.50–1.10)
GFR calc Af Amer: >90 mL/min (ref >90)
GFR, EST NON AFRICAN AMERICAN: 87 mL/min — AB (ref >90)
Glucose, Bld: 77 mg/dL (ref 70–99)
Potassium: 4.8 mmol/L (ref 3.5–5.1)
SODIUM: 141 mmol/L (ref 135–145)
Total Bilirubin: 0.6 mg/dL (ref 0.3–1.2)
Total Protein: 6.7 g/dL (ref 6.0–8.3)

## 2014-12-06 LAB — PROTIME-INR
INR: 1.03 (ref 0.00–1.49)
Prothrombin Time: 13.6 seconds (ref 11.6–15.2)

## 2014-12-06 LAB — CBC
HCT: 40.9 % (ref 36.0–46.0)
Hemoglobin: 13.3 g/dL (ref 12.0–15.0)
MCH: 29.4 pg (ref 26.0–34.0)
MCHC: 32.5 g/dL (ref 30.0–36.0)
MCV: 90.3 fL (ref 78.0–100.0)
PLATELETS: 192 10*3/uL (ref 150–400)
RBC: 4.53 MIL/uL (ref 3.87–5.11)
RDW: 13.4 % (ref 11.5–15.5)
WBC: 5.1 10*3/uL (ref 4.0–10.5)

## 2014-12-06 LAB — URINALYSIS, ROUTINE W REFLEX MICROSCOPIC
Bilirubin Urine: NEGATIVE
Glucose, UA: NEGATIVE mg/dL
HGB URINE DIPSTICK: NEGATIVE
Ketones, ur: NEGATIVE mg/dL
Leukocytes, UA: NEGATIVE
Nitrite: NEGATIVE
PH: 7 (ref 5.0–8.0)
Protein, ur: NEGATIVE mg/dL
SPECIFIC GRAVITY, URINE: 1.019 (ref 1.005–1.030)
UROBILINOGEN UA: 0.2 mg/dL (ref 0.0–1.0)

## 2014-12-06 LAB — SURGICAL PCR SCREEN
MRSA, PCR: NEGATIVE
Staphylococcus aureus: POSITIVE — AB

## 2014-12-06 LAB — ABO/RH: ABO/RH(D): O NEG

## 2014-12-06 LAB — APTT: aPTT: 32 seconds (ref 24–37)

## 2014-12-06 NOTE — Patient Instructions (Addendum)
20 [name]  12/06/2014   Your procedure is scheduled on:   12-13-2014 Friday  Enter through [facility]  Entrance and follow signs to [facility]. Arrive at      [place]  AM.  Call this number if you have problems the morning of surgery: [phone]  Or Presurgical Testing [phone].   For Living Will and/or Health Care Power Attorney Forms: please provide copy for your medical record,may bring AM of surgery(Forms should be already notarized -we do not provide this service).(12-06-14 No information preferred today).     Do not eat food/ or drink: After Midnight.      Take these medicines the morning of surgery with A SIP OF WATER: Levothyroxine. Metoprolol.    Do not wear jewelry, make-up or nail polish.  Do not wear deodorant, lotions, powders, or perfumes.   Do not shave legs and under arms- 48 hours(2 days) prior to first CHG shower.(Shaving face and neck okay.)  Do not bring valuables to the hospital.(Hospital is not responsible for lost valuables).  Contacts, dentures or removable bridgework, body piercing, hair pins may not be worn into surgery.  Leave suitcase in the car. After surgery it may be brought to your room.  For patients admitted to the hospital, checkout time is 11:00 AM the day of discharge.(Restricted visitors-Any Persons displaying flu-like symptoms or illness).    Patients discharged the day of surgery will not be allowed to drive home. Must have responsible person with you x 24 hours once discharged.  Name and phone number of your driver: will arrange     Please read over the following fact sheets that you were given:  CHG(Chlorhexidine Gluconate 4% Surgical Soap) use, MRSA Information, Blood Transfusion fact sheet, Incentive Spirometry Instruction.  Remember : Type/Screen "Blue armbands" - may not be removed once applied(would result in being retested AM of surgery, if removed).         [facility] - Preparing for Surgery Before surgery, you can  play an important role.  Because skin is not sterile, your skin needs to be as free of germs as possible.  You can reduce the number of germs on your skin by washing with CHG (chlorahexidine gluconate) soap before surgery.  CHG is an antiseptic cleaner which kills germs and bonds with the skin to continue killing germs even after washing. Please DO NOT use if you have an allergy to CHG or antibacterial soaps.  If your skin becomes reddened/irritated stop using the CHG and inform your nurse when you arrive at Short Stay. Do not shave (including legs and underarms) for at least 48 hours prior to the first CHG shower.  You may shave your face/neck. Please follow these instructions carefully:  1.  Shower with CHG Soap the night before surgery and the  morning of Surgery.  2.  If you choose to wash your hair, wash your hair first as usual with your  normal  shampoo.  3.  After you shampoo, rinse your hair and body thoroughly to remove the  shampoo.                           4.  Use CHG as you would any other liquid soap.  You can apply chg directly  to the skin and wash                       Gently with a  scrungie or clean washcloth.  5.  Apply the CHG Soap to your body ONLY FROM THE NECK DOWN.   Do not use on face/ open                           Wound or open sores. Avoid contact with eyes, ears mouth and genitals (private parts).                       Wash face,  Genitals (private parts) with your normal soap.             6.  Wash thoroughly, paying special attention to the area where your surgery  will be performed.  7.  Thoroughly rinse your body with warm water from the neck down.  8.  DO NOT shower/wash with your normal soap after using and rinsing off  the CHG Soap.                9.  Pat yourself dry with a clean towel.            10.  Wear clean pajamas.            11.  Place clean sheets on your bed the night of your first shower and do not  sleep with pets. Day of Surgery : Do not apply any  lotions/deodorants the morning of surgery.  Please wear clean clothes to the hospital/surgery center.  FAILURE TO FOLLOW THESE INSTRUCTIONS MAY RESULT IN THE CANCELLATION OF YOUR SURGERY PATIENT SIGNATURE_________________________________  NURSE SIGNATURE__________________________________  ________________________________________________________________________   [name]  An incentive spirometer is a tool that can help keep your lungs clear and active. This tool measures how well you are filling your lungs with each breath. Taking long deep breaths may help reverse or decrease the chance of developing breathing (pulmonary) problems (especially infection) following:  A long period of time when you are unable to move or be active. BEFORE THE PROCEDURE   If the spirometer includes an indicator to show your best effort, your nurse or respiratory therapist will set it to a desired goal.  If possible, sit up straight or lean slightly forward. Try not to slouch.  Hold the incentive spirometer in an upright position. INSTRUCTIONS FOR USE   Sit on the edge of your bed if possible, or sit up as far as you can in bed or on a chair.  Hold the incentive spirometer in an upright position.  Breathe out normally.  Place the mouthpiece in your mouth and seal your lips tightly around it.  Breathe in slowly and as deeply as possible, raising the piston or the ball toward the top of the column.  Hold your breath for 3-5 seconds or for as long as possible. Allow the piston or ball to fall to the bottom of the column.  Remove the mouthpiece from your mouth and breathe out normally.  Rest for a few seconds and repeat Steps 1 through 7 at least 10 times every 1-2 hours when you are awake. Take your time and take a few normal breaths between deep breaths.  The spirometer may include an indicator to show your best effort. Use the indicator as a goal to work toward during each  repetition.  After each set of 10 deep breaths, practice coughing to be sure your lungs are clear. If you have an incision (the cut made at the time of surgery), support your  incision when coughing by placing a pillow or rolled up towels firmly against it. Once you are able to get out of bed, walk around indoors and cough well. You may stop using the incentive spirometer when instructed by your caregiver.  RISKS AND COMPLICATIONS  Take your time so you do not get dizzy or light-headed.  If you are in pain, you may need to take or ask for pain medication before doing incentive spirometry. It is harder to take a deep breath if you are having pain. AFTER USE  Rest and breathe slowly and easily.  It can be helpful to keep track of a log of your progress. Your caregiver can provide you with a simple table to help with this. If you are using the spirometer at home, follow these instructions: [place] IF:   You are having difficultly using the spirometer.  You have trouble using the spirometer as often as instructed.  Your pain medication is not giving enough relief while using the spirometer.  You develop fever of 100.5 F (38.1 C) or higher. SEEK IMMEDIATE MEDICAL CARE IF:   You cough up bloody sputum that had not been present before.  You develop fever of 102 F (38.9 C) or greater.  You develop worsening pain at or near the incision site. MAKE SURE YOU:   Understand these instructions.  Will watch your condition.  Will get help right away if you are not doing well or get worse. Document Released: 01/03/2007 Document Revised: 11/15/2011 Document Reviewed: 03/06/2007 Exit[employer] Patient Information 2014 [employer], [place].   ________________________________________________________________________  WHAT IS A BLOOD TRANSFUSION? Blood Transfusion Information  A transfusion is the replacement of blood or some of its parts. Blood is made up of multiple cells which provide  different functions.  Red blood cells carry oxygen and are used for blood loss replacement.  White blood cells fight against infection.  Platelets control bleeding.  Plasma helps clot blood.  Other blood products are available for specialized needs, such as hemophilia or other clotting disorders. BEFORE THE TRANSFUSION  Who gives blood for transfusions?   Healthy volunteers who are fully evaluated to make sure their blood is safe. This is blood bank blood. Transfusion therapy is the safest it has ever been in the practice of medicine. Before blood is taken from a donor, a complete history is taken to make sure that person has no history of diseases nor engages in risky social behavior (examples are intravenous drug use or sexual activity with multiple partners). The donor's travel history is screened to minimize risk of transmitting infections, such as malaria. The donated blood is tested for signs of infectious diseases, such as HIV and hepatitis. The blood is then tested to be sure it is compatible with you in order to minimize the chance of a transfusion reaction. If you or a relative donates blood, this is often done in anticipation of surgery and is not appropriate for emergency situations. It takes many days to process the donated blood. RISKS AND COMPLICATIONS Although transfusion therapy is very safe and saves many lives, the main dangers of transfusion include:   Getting an infectious disease.  Developing a transfusion reaction. This is an allergic reaction to something in the blood you were given. Every precaution is taken to prevent this. The decision to have a blood transfusion has been considered carefully by your caregiver before blood is given. Blood is not given unless the benefits outweigh the risks. AFTER THE TRANSFUSION  Right after  receiving a blood transfusion, you will usually feel much better and more energetic. This is especially true if your red blood cells have  gotten low (anemic). The transfusion raises the level of the red blood cells which carry oxygen, and this usually causes an energy increase.  The nurse administering the transfusion will monitor you carefully for complications. HOME CARE INSTRUCTIONS  No special instructions are needed after a transfusion. You may find your energy is better. Speak with your caregiver about any limitations on activity for underlying diseases you may have. SEEK MEDICAL CARE IF:   Your condition is not improving after your transfusion.  You develop redness or irritation at the intravenous (IV) site. SEEK IMMEDIATE MEDICAL CARE IF:  Any of the following symptoms occur over the next 12 hours:  Shaking chills.  You have a temperature by mouth above 102 F (38.9 C), not controlled by medicine.  Chest, back, or muscle pain.  People around you feel you are not acting correctly or are confused.  Shortness of breath or difficulty breathing.  Dizziness and fainting.  You get a rash or develop hives.  You have a decrease in urine output.  Your urine turns a dark color or changes to pink, red, or brown. Any of the following symptoms occur over the next 10 days:  You have a temperature by mouth above 102 F (38.9 C), not controlled by medicine.  Shortness of breath.  Weakness after normal activity.  The white part of the eye turns yellow (jaundice).  You have a decrease in the amount of urine or are urinating less often.  Your urine turns a dark color or changes to pink, red, or brown. Document Released: 08/20/2000 Document Revised: 11/15/2011 Document Reviewed: 04/08/2008 [facility] Patient Information 2014 [place], [place].  _______________________________________________________________________

## 2014-12-06 NOTE — Pre-Procedure Instructions (Signed)
12-06-14 EKG 09-09-14 report with chart. Clearance note(Dr. Ethlyn Gallery 09-09-14 ) with chart.

## 2014-12-09 NOTE — Progress Notes (Signed)
12-09-14 0920 Pt. Notified of Positive Staph aureus PCR- to use Mupirocin Called to [place], to use as directed.Faxed not to Dr. Anne Fu office  [phone].

## 2014-12-10 MED ORDER — CEFAZOLIN SODIUM-DEXTROSE 2-3 GM-% IV SOLR: 2.0000 g | INTRAVENOUS | Status: DC

## 2014-12-10 NOTE — H&P (Signed)
TOTAL KNEE ADMISSION H&P  Patient is being admitted for right total knee arthroplasty.  Subjective:  Chief Complaint:right knee pain.  HPI: [name], 74 y.o. female, has a history of pain and functional disability in the right knee due to arthritis and has failed non-surgical conservative treatments for greater than 12 weeks to includeNSAID's and/or analgesics, corticosteriod injections and activity modification.  Onset of symptoms was gradual, starting 3 years ago with gradually worsening course since that time. The patient noted no past surgery on the right knee(s).  Patient currently rates pain in the right knee(s) at 6 out of 10 with activity. Patient has night pain, worsening of pain with activity and weight bearing, pain that interferes with activities of daily living, pain with passive range of motion, crepitus and joint swelling.  Patient has evidence of periarticular osteophytes and joint space narrowing by imaging studies.  There is no active infection.   Past Medical History  Diagnosis Date  . Hypertension   . Hypothyroidism   . Arthritis   . Hyperlipemia   . Wears glasses   . Complication of anesthesia     woke up too early from a surgery  . Bladder spasms     "spastic bladder" wears pads  . GERD (gastroesophageal reflux disease)     occ. frequent "hiccoughs" after "eating, indigestion"  . Trigger finger, left     alternate fingers affected. -Occ. shooting pain.  . Cancer     Skin cancer -scalp and eyebrow"basal cell"    Past Surgical History  Procedure Laterality Date  . Total hip arthroplasty  2006    right  . Total hip arthroplasty  2006    left  . Tonsillectomy    . Appendectomy    . Carpal tunnel release Left   . Thyroidectomy  1994  . Cardiac catheterization  2006    normal-done for work up pre hip surgeries  . Cholecystectomy  1997  . Abdominal hysterectomy  1989  . Polypectomy Bilateral 06/26/2013    Procedure: BILATERAL EXCISION OF NASAL POLYPS ;   Surgeon: Ascencion Dike, MD;  Location: [place];  Service: ENT;  Laterality: Bilateral;  . Septoplasty N/A 06/26/2013    Procedure: AND SEPTOPLASTY;  Surgeon: Ascencion Dike, MD;  Location: [place];  Service: ENT;  Laterality: N/A;  . Joint replacement      BTHA      Current outpatient prescriptions:  .  aspirin 325 MG EC tablet, Take 325 mg by mouth daily., Disp: , Rfl:  .  fesoterodine (TOVIAZ) 8 MG TB24 tablet, Take 8 mg by mouth daily. Takes at bedtime, Disp: , Rfl:  .  levothyroxine (SYNTHROID, LEVOTHROID) 125 MCG tablet, Take 125 mcg by mouth daily before breakfast., Disp: , Rfl:  .  metoprolol (LOPRESSOR) 50 MG tablet, Take 50 mg by mouth 2 (two) times daily., Disp: , Rfl:  .  Multiple Vitamins-Minerals (PRESERVISION AREDS 2 PO), Take 1 tablet by mouth 2 (two) times daily., Disp: , Rfl:  .  simvastatin (ZOCOR) 10 MG tablet, Take 10 mg by mouth at bedtime., Disp: , Rfl:     Allergies  Allergen Reactions  . Feldene [Piroxicam] Other (See Comments)    CANT REMEMBER  . Ivp Dye [Iodinated Diagnostic Agents] Other (See Comments)    CANT REMEMBER  . Naproxen Nausea And Vomiting  . Sulfa Antibiotics     CANT REMEMBER  . Vicodin [Hydrocodone-Acetaminophen] Nausea And Vomiting    History  Substance Use Topics  . Smoking status: Never Smoker   . Smokeless tobacco: No  . Alcohol Use: No      Review of Systems  Constitutional: Negative.   HENT: Negative.   Eyes: Negative.   Respiratory: Negative.   Cardiovascular: Negative.   Gastrointestinal: Positive for heartburn. Negative for nausea, vomiting, abdominal pain, diarrhea, constipation, blood in stool and melena.  Genitourinary: Positive for frequency. Negative for dysuria, urgency, hematuria and flank pain.       Positive for incontinence  Musculoskeletal: Positive for joint pain. Negative for myalgias, back pain, falls and neck pain.  Skin: Negative.   Neurological: Negative.    Endo/Heme/Allergies: Negative.   Psychiatric/Behavioral: Negative.     Objective:  Physical Exam  Constitutional: She is oriented to person, place, and time. She appears well-developed. No distress.  Obese  HENT:  Head: Normocephalic and atraumatic.  Right Ear: External ear normal.  Left Ear: External ear normal.  Nose: Nose normal.  Mouth/Throat: Oropharynx is clear and moist.  Eyes: Conjunctivae and EOM are normal.  Neck: Normal range of motion. Neck supple.  Cardiovascular: Normal rate, regular rhythm, normal heart sounds and intact distal pulses.   No murmur heard. Respiratory: Effort normal and breath sounds normal. No respiratory distress. She has no wheezes.  GI: Soft. Bowel sounds are normal. She exhibits no distension. There is no tenderness.  Musculoskeletal:       Right hip: Normal.       Left hip: Normal.       Right knee: She exhibits decreased range of motion and swelling. She exhibits no effusion and no erythema. Tenderness found. Medial joint line and lateral joint line tenderness noted.       Left knee: She exhibits decreased range of motion. She exhibits no swelling, no effusion and no erythema. Tenderness found. Medial joint line and lateral joint line tenderness noted.  Both hips flex to 120, rotate in 30, out 40 and abduct 40 without discomfort. Her right knee shows no effusion. Range is about 5 to 100 with marked crepitus on range of motion, tender medial greater than lateral with no instability. The left knee has no effusion and range 5 to 120. Marked crepitus on range of motion, tender medial greater than lateral with no instability.  Neurological: She is alert and oriented to person, place, and time. She has normal strength and normal reflexes. No sensory deficit.  Skin: No rash noted. She is not diaphoretic. No erythema.  Psychiatric: She has a normal mood and affect. Her behavior is normal.   Vitals  Weight: 188 lb Height: 63in Body Surface Area:  1.88 m Body Mass Index: 33.3 kg/m  Pulse: 72 (Regular)  BP: 122/78 (Sitting, Left Arm, Standard)   Imaging Review Plain radiographs demonstrate severe degenerative joint disease of the right knee(s). The overall alignment ismild varus. The bone quality appears to be good for age and reported activity level.  Assessment/Plan:  End stage primary osteoarthritis, right knee   The patient history, physical examination, clinical judgment of the provider and imaging studies are consistent with end stage degenerative joint disease of the right knee(s) and total knee arthroplasty is deemed medically necessary. The treatment options including medical management, injection therapy arthroscopy and arthroplasty were discussed at length. The risks and benefits of total knee arthroplasty were presented and reviewed. The risks due to aseptic loosening, infection, stiffness, patella tracking problems, thromboembolic complications and other imponderables were discussed. The patient acknowledged the explanation, agreed to proceed with  the plan and consent was signed. Patient is being admitted for inpatient treatment for surgery, pain control, PT, OT, prophylactic antibiotics, VTE prophylaxis, progressive ambulation and ADL's and discharge planning. The patient is planning to be discharged to skilled nursing facility ([facility])  TXA IV PCP: Dr. Chana Bode, PA-C

## 2014-12-11 DIAGNOSIS — B351 Tinea unguium: Secondary | ICD-10-CM | POA: Diagnosis not present

## 2014-12-13 ENCOUNTER — Inpatient Hospital Stay (HOSPITAL_COMMUNITY)
Admission: RE | Admit: 2014-12-13 | Discharge: 2014-12-16 | DRG: 470 | Disposition: A | Discharge status: Skilled Nursing Facility | Payer: Medicare Other | Source: Ambulatory Visit | Attending: Orthopedic Surgery | Admitting: Orthopedic Surgery

## 2014-12-13 ENCOUNTER — Inpatient Hospital Stay (HOSPITAL_COMMUNITY): Payer: Medicare Other | Admitting: Anesthesiology

## 2014-12-13 ENCOUNTER — Encounter (HOSPITAL_COMMUNITY): Payer: Self-pay | Admitting: *Deleted

## 2014-12-13 ENCOUNTER — Encounter (HOSPITAL_COMMUNITY)
Admission: RE | Disposition: A | Discharge status: Skilled Nursing Facility | Payer: Self-pay | Source: Ambulatory Visit | Attending: Orthopedic Surgery

## 2014-12-13 DIAGNOSIS — Z85828 Personal history of other malignant neoplasm of skin: Secondary | ICD-10-CM | POA: Diagnosis not present

## 2014-12-13 DIAGNOSIS — K219 Gastro-esophageal reflux disease without esophagitis: Secondary | ICD-10-CM | POA: Diagnosis present

## 2014-12-13 DIAGNOSIS — E785 Hyperlipidemia, unspecified: Secondary | ICD-10-CM | POA: Diagnosis present

## 2014-12-13 DIAGNOSIS — Z96651 Presence of right artificial knee joint: Secondary | ICD-10-CM | POA: Diagnosis not present

## 2014-12-13 DIAGNOSIS — M1711 Unilateral primary osteoarthritis, right knee: Secondary | ICD-10-CM | POA: Diagnosis not present

## 2014-12-13 DIAGNOSIS — K210 Gastro-esophageal reflux disease with esophagitis: Secondary | ICD-10-CM | POA: Diagnosis not present

## 2014-12-13 DIAGNOSIS — E039 Hypothyroidism, unspecified: Secondary | ICD-10-CM | POA: Diagnosis present

## 2014-12-13 DIAGNOSIS — R2681 Unsteadiness on feet: Secondary | ICD-10-CM | POA: Diagnosis not present

## 2014-12-13 DIAGNOSIS — M179 Osteoarthritis of knee, unspecified: Secondary | ICD-10-CM | POA: Diagnosis not present

## 2014-12-13 DIAGNOSIS — M25561 Pain in right knee: Secondary | ICD-10-CM | POA: Diagnosis not present

## 2014-12-13 DIAGNOSIS — M6281 Muscle weakness (generalized): Secondary | ICD-10-CM | POA: Diagnosis not present

## 2014-12-13 DIAGNOSIS — M1710 Unilateral primary osteoarthritis, unspecified knee: Secondary | ICD-10-CM | POA: Diagnosis present

## 2014-12-13 DIAGNOSIS — Z471 Aftercare following joint replacement surgery: Secondary | ICD-10-CM | POA: Diagnosis not present

## 2014-12-13 DIAGNOSIS — M1990 Unspecified osteoarthritis, unspecified site: Secondary | ICD-10-CM | POA: Diagnosis not present

## 2014-12-13 DIAGNOSIS — I10 Essential (primary) hypertension: Secondary | ICD-10-CM | POA: Diagnosis not present

## 2014-12-13 DIAGNOSIS — Z96643 Presence of artificial hip joint, bilateral: Secondary | ICD-10-CM | POA: Diagnosis present

## 2014-12-13 DIAGNOSIS — E038 Other specified hypothyroidism: Secondary | ICD-10-CM | POA: Diagnosis not present

## 2014-12-13 HISTORY — PX: TOTAL KNEE ARTHROPLASTY: SHX125

## 2014-12-13 LAB — TYPE AND SCREEN
ABO/RH(D): O NEG
Antibody Screen: NEGATIVE

## 2014-12-13 SURGERY — ARTHROPLASTY, KNEE, TOTAL
Anesthesia: Monitor Anesthesia Care | Site: Knee | Laterality: Right

## 2014-12-13 MED ORDER — SODIUM CHLORIDE 0.9 % IJ SOLN
INTRAMUSCULAR | Status: DC | PRN
  Administered 2014-12-13: 30 mL

## 2014-12-13 MED ORDER — FESOTERODINE FUMARATE ER 8 MG PO TB24
8.0000 mg | ORAL_TABLET | Freq: Every day | ORAL | Status: DC
  Administered 2014-12-13 – 2014-12-15 (×3): 8 mg via ORAL
  Filled 2014-12-13 (×5): qty 1

## 2014-12-13 MED ORDER — ONDANSETRON HCL 4 MG PO TABS: 4.0000 mg | ORAL_TABLET | Freq: Four times a day (QID) | ORAL | Status: DC | PRN

## 2014-12-13 MED ORDER — MIDAZOLAM HCL 2 MG/2ML IJ SOLN
INTRAMUSCULAR | Status: AC
  Filled 2014-12-13: qty 2

## 2014-12-13 MED ORDER — DEXAMETHASONE SODIUM PHOSPHATE 10 MG/ML IJ SOLN
INTRAMUSCULAR | Status: AC
  Filled 2014-12-13: qty 1

## 2014-12-13 MED ORDER — PHENOL 1.4 % MT LIQD: 1.0000 | OROMUCOSAL | Status: DC | PRN

## 2014-12-13 MED ORDER — BISACODYL 10 MG RE SUPP
10.0000 mg | Freq: Every day | RECTAL | Status: DC | PRN
Start: 2014-12-13 — End: 2014-12-16

## 2014-12-13 MED ORDER — BUPIVACAINE LIPOSOME 1.3 % IJ SUSP
20.0000 mL | Freq: Once | INTRAMUSCULAR | Status: DC
  Filled 2014-12-13: qty 20

## 2014-12-13 MED ORDER — TRAMADOL HCL 50 MG PO TABS
50.0000 mg | ORAL_TABLET | Freq: Four times a day (QID) | ORAL | Status: DC | PRN
  Administered 2014-12-14 – 2014-12-16 (×2): 50 mg via ORAL
  Filled 2014-12-13 (×2): qty 1

## 2014-12-13 MED ORDER — BUPIVACAINE IN DEXTROSE 0.75-8.25 % IT SOLN
INTRATHECAL | Status: DC | PRN
  Administered 2014-12-13: 15 mg via INTRATHECAL

## 2014-12-13 MED ORDER — ONDANSETRON HCL 4 MG/2ML IJ SOLN
INTRAMUSCULAR | Status: DC | PRN
  Administered 2014-12-13: 4 mg via INTRAVENOUS

## 2014-12-13 MED ORDER — ACETAMINOPHEN 325 MG PO TABS
650.0000 mg | ORAL_TABLET | Freq: Four times a day (QID) | ORAL | Status: DC | PRN
  Administered 2014-12-15 – 2014-12-16 (×3): 650 mg via ORAL
  Filled 2014-12-13 (×2): qty 2

## 2014-12-13 MED ORDER — SODIUM CHLORIDE 0.9 % IV SOLN: INTRAVENOUS | Status: DC

## 2014-12-13 MED ORDER — POLYETHYLENE GLYCOL 3350 17 G PO PACK: 17.0000 g | PACK | Freq: Every day | ORAL | Status: DC | PRN

## 2014-12-13 MED ORDER — MENTHOL 3 MG MT LOZG: 1.0000 | LOZENGE | OROMUCOSAL | Status: DC | PRN

## 2014-12-13 MED ORDER — BUPIVACAINE HCL 0.25 % IJ SOLN
INTRAMUSCULAR | Status: DC | PRN
  Administered 2014-12-13: 30 mL

## 2014-12-13 MED ORDER — ACETAMINOPHEN 10 MG/ML IV SOLN
1000.0000 mg | Freq: Once | INTRAVENOUS | Status: AC
  Administered 2014-12-13: 1000 mg via INTRAVENOUS
  Filled 2014-12-13: qty 100

## 2014-12-13 MED ORDER — PROPOFOL INFUSION 10 MG/ML OPTIME
INTRAVENOUS | Status: DC | PRN
  Administered 2014-12-13: 75 ug/kg/min via INTRAVENOUS

## 2014-12-13 MED ORDER — DEXAMETHASONE SODIUM PHOSPHATE 10 MG/ML IJ SOLN
10.0000 mg | Freq: Once | INTRAMUSCULAR | Status: AC
  Administered 2014-12-14: 10 mg via INTRAVENOUS
  Filled 2014-12-13: qty 1

## 2014-12-13 MED ORDER — OXYCODONE HCL 5 MG PO TABS
5.0000 mg | ORAL_TABLET | ORAL | Status: DC | PRN
  Administered 2014-12-13 (×2): 10 mg via ORAL
  Administered 2014-12-14 (×5): 5 mg via ORAL
  Administered 2014-12-14: 10 mg via ORAL
  Administered 2014-12-15 – 2014-12-16 (×6): 5 mg via ORAL
  Filled 2014-12-13 (×4): qty 1
  Filled 2014-12-13: qty 2
  Filled 2014-12-13 (×4): qty 1
  Filled 2014-12-13 (×2): qty 2
  Filled 2014-12-13 (×2): qty 1
  Filled 2014-12-13 (×2): qty 2
  Filled 2014-12-13: qty 1
  Filled 2014-12-13: qty 2

## 2014-12-13 MED ORDER — HYDROMORPHONE HCL 1 MG/ML IJ SOLN: 0.2500 mg | INTRAMUSCULAR | Status: DC | PRN

## 2014-12-13 MED ORDER — CEFAZOLIN SODIUM-DEXTROSE 2-3 GM-% IV SOLR
2.0000 g | Freq: Four times a day (QID) | INTRAVENOUS | Status: AC
  Administered 2014-12-13 (×2): 2 g via INTRAVENOUS
  Filled 2014-12-13 (×2): qty 50

## 2014-12-13 MED ORDER — SODIUM CHLORIDE 0.9 % IJ SOLN
INTRAMUSCULAR | Status: AC
  Filled 2014-12-13: qty 50

## 2014-12-13 MED ORDER — LACTATED RINGERS IV SOLN
INTRAVENOUS | Status: DC | PRN
  Administered 2014-12-13 (×2): via INTRAVENOUS

## 2014-12-13 MED ORDER — EPHEDRINE SULFATE 50 MG/ML IJ SOLN
INTRAMUSCULAR | Status: AC
  Filled 2014-12-13: qty 1

## 2014-12-13 MED ORDER — LACTATED RINGERS IV SOLN: INTRAVENOUS | Status: DC

## 2014-12-13 MED ORDER — BUPIVACAINE-EPINEPHRINE (PF) 0.25% -1:200000 IJ SOLN
INTRAMUSCULAR | Status: AC
  Filled 2014-12-13: qty 30

## 2014-12-13 MED ORDER — DIPHENHYDRAMINE HCL 12.5 MG/5ML PO ELIX: 12.5000 mg | ORAL_SOLUTION | ORAL | Status: DC | PRN

## 2014-12-13 MED ORDER — ONDANSETRON HCL 4 MG/2ML IJ SOLN: 4.0000 mg | Freq: Four times a day (QID) | INTRAMUSCULAR | Status: DC | PRN

## 2014-12-13 MED ORDER — METOCLOPRAMIDE HCL 10 MG PO TABS: 5.0000 mg | ORAL_TABLET | Freq: Three times a day (TID) | ORAL | Status: DC | PRN

## 2014-12-13 MED ORDER — FLEET ENEMA 7-19 GM/118ML RE ENEM: 1.0000 | ENEMA | Freq: Once | RECTAL | Status: AC | PRN

## 2014-12-13 MED ORDER — ACETAMINOPHEN 500 MG PO TABS
1000.0000 mg | ORAL_TABLET | Freq: Four times a day (QID) | ORAL | Status: AC
  Administered 2014-12-13 – 2014-12-14 (×4): 1000 mg via ORAL
  Filled 2014-12-13 (×3): qty 2

## 2014-12-13 MED ORDER — ACETAMINOPHEN 650 MG RE SUPP: 650.0000 mg | Freq: Four times a day (QID) | RECTAL | Status: DC | PRN

## 2014-12-13 MED ORDER — METHOCARBAMOL 1000 MG/10ML IJ SOLN
500.0000 mg | Freq: Four times a day (QID) | INTRAVENOUS | Status: DC | PRN
  Filled 2014-12-13: qty 5

## 2014-12-13 MED ORDER — CEFAZOLIN SODIUM-DEXTROSE 2-3 GM-% IV SOLR
2.0000 g | INTRAVENOUS | Status: AC
  Administered 2014-12-13: 2 g via INTRAVENOUS
  Filled 2014-12-13: qty 50

## 2014-12-13 MED ORDER — DEXAMETHASONE SODIUM PHOSPHATE 10 MG/ML IJ SOLN
10.0000 mg | Freq: Once | INTRAMUSCULAR | Status: AC
  Administered 2014-12-13: 10 mg via INTRAVENOUS

## 2014-12-13 MED ORDER — VANCOMYCIN HCL IN DEXTROSE 1-5 GM/200ML-% IV SOLN: 1000.0000 mg | Freq: Once | INTRAVENOUS | Status: DC

## 2014-12-13 MED ORDER — LEVOTHYROXINE SODIUM 125 MCG PO TABS
125.0000 ug | ORAL_TABLET | Freq: Every day | ORAL | Status: DC
  Administered 2014-12-14 – 2014-12-16 (×3): 125 ug via ORAL
  Filled 2014-12-13 (×5): qty 1

## 2014-12-13 MED ORDER — MIDAZOLAM HCL 5 MG/5ML IJ SOLN
INTRAMUSCULAR | Status: DC | PRN
  Administered 2014-12-13 (×2): 1 mg via INTRAVENOUS

## 2014-12-13 MED ORDER — ONDANSETRON HCL 4 MG/2ML IJ SOLN
INTRAMUSCULAR | Status: AC
  Filled 2014-12-13: qty 2

## 2014-12-13 MED ORDER — CHLORHEXIDINE GLUCONATE 4 % EX LIQD: 60.0000 mL | Freq: Once | CUTANEOUS | Status: DC

## 2014-12-13 MED ORDER — CEFAZOLIN SODIUM-DEXTROSE 2-3 GM-% IV SOLR
INTRAVENOUS | Status: AC
  Filled 2014-12-13: qty 50

## 2014-12-13 MED ORDER — FENTANYL CITRATE 0.05 MG/ML IJ SOLN
INTRAMUSCULAR | Status: DC | PRN
  Administered 2014-12-13 (×2): 50 ug via INTRAVENOUS

## 2014-12-13 MED ORDER — PROPOFOL 10 MG/ML IV BOLUS
INTRAVENOUS | Status: AC
  Filled 2014-12-13: qty 20

## 2014-12-13 MED ORDER — METHOCARBAMOL 500 MG PO TABS
500.0000 mg | ORAL_TABLET | Freq: Four times a day (QID) | ORAL | Status: DC | PRN
  Administered 2014-12-13 – 2014-12-15 (×6): 500 mg via ORAL
  Filled 2014-12-13 (×6): qty 1

## 2014-12-13 MED ORDER — METOCLOPRAMIDE HCL 5 MG/ML IJ SOLN: 5.0000 mg | Freq: Three times a day (TID) | INTRAMUSCULAR | Status: DC | PRN

## 2014-12-13 MED ORDER — METOPROLOL TARTRATE 50 MG PO TABS
50.0000 mg | ORAL_TABLET | Freq: Two times a day (BID) | ORAL | Status: DC
  Administered 2014-12-14 – 2014-12-16 (×4): 50 mg via ORAL
  Filled 2014-12-13 (×7): qty 1

## 2014-12-13 MED ORDER — DOCUSATE SODIUM 100 MG PO CAPS
100.0000 mg | ORAL_CAPSULE | Freq: Two times a day (BID) | ORAL | Status: DC
  Administered 2014-12-13 – 2014-12-16 (×6): 100 mg via ORAL

## 2014-12-13 MED ORDER — MORPHINE SULFATE 2 MG/ML IJ SOLN
1.0000 mg | INTRAMUSCULAR | Status: DC | PRN
Start: 2014-12-13 — End: 2014-12-16

## 2014-12-13 MED ORDER — RIVAROXABAN 10 MG PO TABS
10.0000 mg | ORAL_TABLET | Freq: Every day | ORAL | Status: DC
  Administered 2014-12-14 – 2014-12-16 (×3): 10 mg via ORAL
  Filled 2014-12-13 (×4): qty 1

## 2014-12-13 MED ORDER — SODIUM CHLORIDE 0.9 % IV SOLN
1000.0000 mg | INTRAVENOUS | Status: AC
  Administered 2014-12-13: 1000 mg via INTRAVENOUS
  Filled 2014-12-13: qty 10

## 2014-12-13 MED ORDER — VANCOMYCIN HCL IN DEXTROSE 1-5 GM/200ML-% IV SOLN
INTRAVENOUS | Status: AC
  Filled 2014-12-13: qty 200

## 2014-12-13 MED ORDER — BUPIVACAINE LIPOSOME 1.3 % IJ SUSP
INTRAMUSCULAR | Status: DC | PRN
  Administered 2014-12-13: 20 mL

## 2014-12-13 MED ORDER — SIMVASTATIN 10 MG PO TABS
10.0000 mg | ORAL_TABLET | Freq: Every day | ORAL | Status: DC
  Administered 2014-12-13 – 2014-12-15 (×3): 10 mg via ORAL
  Filled 2014-12-13 (×4): qty 1

## 2014-12-13 MED ORDER — DEXTROSE-NACL 5-0.9 % IV SOLN
INTRAVENOUS | Status: DC
  Administered 2014-12-13 – 2014-12-14 (×2): via INTRAVENOUS

## 2014-12-13 MED ORDER — FENTANYL CITRATE 0.05 MG/ML IJ SOLN
INTRAMUSCULAR | Status: AC
  Filled 2014-12-13: qty 2

## 2014-12-13 SURGICAL SUPPLY — 65 items
BAG DECANTER FOR FLEXI CONT (MISCELLANEOUS) ×2 IMPLANT
BAG SPEC THK2 15X12 ZIP CLS (MISCELLANEOUS) ×1
BAG ZIPLOCK 12X15 (MISCELLANEOUS) ×2 IMPLANT
BANDAGE ELASTIC 6 VELCRO ST LF (GAUZE/BANDAGES/DRESSINGS) ×2 IMPLANT
BANDAGE ESMARK 6X9 LF (GAUZE/BANDAGES/DRESSINGS) ×1 IMPLANT
BLADE SAG 18X100X1.27 (BLADE) ×2 IMPLANT
BLADE SAW SGTL 11.0X1.19X90.0M (BLADE) ×2 IMPLANT
BNDG CMPR 9X6 STRL LF SNTH (GAUZE/BANDAGES/DRESSINGS) ×1
BNDG ESMARK 6X9 LF (GAUZE/BANDAGES/DRESSINGS) ×2
BOWL SMART MIX CTS (DISPOSABLE) ×2 IMPLANT
CAP KNEE TOTAL 3 SIGMA ×1 IMPLANT
CATH FOLEY LATEX FREE 16FR (CATHETERS) ×1 IMPLANT
CEMENT HV SMART SET (Cement) ×4 IMPLANT
CUFF TOURN SGL QUICK 34 (TOURNIQUET CUFF) ×2
CUFF TRNQT CYL 34X4X40X1 (TOURNIQUET CUFF) ×1 IMPLANT
DECANTER SPIKE VIAL GLASS SM (MISCELLANEOUS) ×2 IMPLANT
DRAPE EXTREMITY T 121X128X90 (DRAPE) ×2 IMPLANT
DRAPE POUCH INSTRU U-SHP 10X18 (DRAPES) ×2 IMPLANT
DRAPE U-SHAPE 47X51 STRL (DRAPES) ×2 IMPLANT
DRSG ADAPTIC 3X8 NADH LF (GAUZE/BANDAGES/DRESSINGS) ×2 IMPLANT
DRSG PAD ABDOMINAL 8X10 ST (GAUZE/BANDAGES/DRESSINGS) ×2 IMPLANT
DURAPREP 26ML APPLICATOR (WOUND CARE) ×2 IMPLANT
ELECT REM PT RETURN 9FT ADLT (ELECTROSURGICAL) ×2
ELECTRODE REM PT RTRN 9FT ADLT (ELECTROSURGICAL) ×1 IMPLANT
EVACUATOR 1/8 PVC DRAIN (DRAIN) ×2 IMPLANT
FACESHIELD WRAPAROUND (MASK) ×10 IMPLANT
FACESHIELD WRAPAROUND OR TEAM (MASK) ×5 IMPLANT
GAUZE SPONGE 4X4 12PLY STRL (GAUZE/BANDAGES/DRESSINGS) ×2 IMPLANT
GLOVE BIO SURGEON STRL SZ7.5 (GLOVE) IMPLANT
GLOVE BIO SURGEON STRL SZ8 (GLOVE) ×1 IMPLANT
GLOVE BIOGEL PI IND STRL 6.5 (GLOVE) IMPLANT
GLOVE BIOGEL PI IND STRL 8 (GLOVE) ×1 IMPLANT
GLOVE BIOGEL PI INDICATOR 6.5 (GLOVE)
GLOVE BIOGEL PI INDICATOR 8 (GLOVE) ×2
GLOVE SURG SS PI 6.5 STRL IVOR (GLOVE) IMPLANT
GOWN STRL REUS W/TWL LRG LVL3 (GOWN DISPOSABLE) ×2 IMPLANT
GOWN STRL REUS W/TWL XL LVL3 (GOWN DISPOSABLE) IMPLANT
HANDPIECE INTERPULSE COAX TIP (DISPOSABLE) ×2
IMMOBILIZER KNEE 20 (SOFTGOODS) ×2
IMMOBILIZER KNEE 20 THIGH 36 (SOFTGOODS) ×1 IMPLANT
KIT BASIN OR (CUSTOM PROCEDURE TRAY) ×2 IMPLANT
MANIFOLD NEPTUNE II (INSTRUMENTS) ×2 IMPLANT
NDL SAFETY ECLIPSE 18X1.5 (NEEDLE) ×2 IMPLANT
NEEDLE HYPO 18GX1.5 SHARP (NEEDLE) ×4
NS IRRIG 1000ML POUR BTL (IV SOLUTION) ×2 IMPLANT
PACK TOTAL JOINT (CUSTOM PROCEDURE TRAY) ×2 IMPLANT
PADDING CAST COTTON 6X4 STRL (CAST SUPPLIES) ×4 IMPLANT
PEN SKIN MARKING BROAD (MISCELLANEOUS) ×2 IMPLANT
POSITIONER SURGICAL ARM (MISCELLANEOUS) ×2 IMPLANT
SET HNDPC FAN SPRY TIP SCT (DISPOSABLE) ×1 IMPLANT
SLEEVE SURGEON STRL (DRAPES) ×1 IMPLANT
STRIP CLOSURE SKIN 1/2X4 (GAUZE/BANDAGES/DRESSINGS) ×3 IMPLANT
SUCTION FRAZIER 12FR DISP (SUCTIONS) ×2 IMPLANT
SUT MNCRL AB 4-0 PS2 18 (SUTURE) ×2 IMPLANT
SUT VIC AB 2-0 CT1 27 (SUTURE) ×6
SUT VIC AB 2-0 CT1 TAPERPNT 27 (SUTURE) ×3 IMPLANT
SUT VLOC 180 0 24IN GS25 (SUTURE) ×2 IMPLANT
SYR 20CC LL (SYRINGE) ×2 IMPLANT
SYR 50ML LL SCALE MARK (SYRINGE) ×2 IMPLANT
TOWEL OR 17X26 10 PK STRL BLUE (TOWEL DISPOSABLE) ×2 IMPLANT
TOWEL OR NON WOVEN STRL DISP B (DISPOSABLE) IMPLANT
TRAY FOLEY CATH 14FRSI W/METER (CATHETERS) ×1 IMPLANT
WATER STERILE IRR 1500ML POUR (IV SOLUTION) ×2 IMPLANT
WRAP KNEE MAXI GEL POST OP (GAUZE/BANDAGES/DRESSINGS) ×2 IMPLANT
YANKAUER SUCT BULB TIP 10FT TU (MISCELLANEOUS) ×2 IMPLANT

## 2014-12-13 NOTE — Evaluation (Addendum)
Physical Therapy Evaluation Patient Details Name: [name] MRN: [id number] DOB: [birth year] Today's Date: 12/13/2014   History of Present Illness  R TKR - s/p Bil THR in 2006  Clinical Impression  PT s/p R TKR presents with decreased R LE strength/ROM and post op pain limiting functional mobility.  Pt will benefit from follow up rehab at SNF level to maximize IND and safety prior to return home with ltd assist.    Follow Up Recommendations SNF    Equipment Recommendations  None recommended by PT    Recommendations for Other Services OT consult     Precautions / Restrictions Precautions Precautions: Knee;Fall Required Braces or Orthoses: Knee Immobilizer - Right Knee Immobilizer - Right: Discontinue once straight leg raise with < 10 degree lag Restrictions Weight Bearing Restrictions: No Other Position/Activity Restrictions: WBAT      Mobility  Bed Mobility Overal bed mobility: Needs Assistance Bed Mobility: Supine to Sit     Supine to sit: Mod assist     General bed mobility comments: cues for sequence and use of L LE to self assist  Transfers Overall transfer level: Needs assistance Equipment used: Rolling walker (2 wheeled) Transfers: Sit to/from Stand Sit to Stand: Mod assist         General transfer comment: cues for LE management and use of UEs to self assist  Ambulation/Gait Ambulation/Gait assistance: Min assist;Mod assist Ambulation Distance (Feet): 28 Feet Assistive device: Rolling walker (2 wheeled) Gait Pattern/deviations: Step-to pattern;Decreased step length - right;Decreased step length - left;Shuffle;Trunk flexed Gait velocity: decr   General Gait Details: cues for sequence, posture and position from [employer]            Wheelchair Mobility    Modified Rankin (Stroke Patients Only)       Balance                                             Pertinent Vitals/Pain Pain Assessment: 0-10 Pain Score: 1   Pain Location: R knee Pain Descriptors / Indicators: Sore Pain Intervention(s): Limited activity within patient's tolerance;Premedicated before session;Monitored during session;Ice applied    Home Living Family/patient expects to be discharged to:: Skilled nursing facility Living Arrangements: Parent               Additional Comments: Mother is 44 yrs old    Prior Function Level of Independence: Independent               Hand Dominance        Extremity/Trunk Assessment   Upper Extremity Assessment: Overall WFL for tasks assessed           Lower Extremity Assessment: RLE deficits/detail RLE Deficits / Details: 2+/5 quads     Cervical / Trunk Assessment: Normal  Communication   Communication: No difficulties  Cognition Arousal/Alertness: Awake/alert Behavior During Therapy: WFL for tasks assessed/performed Overall Cognitive Status: Within Functional Limits for tasks assessed                      General Comments      Exercises Total Joint Exercises Ankle Circles/Pumps: AROM;15 reps;Supine;Both Straight Leg Raises: AAROM;Right;5 reps;Supine      Assessment/Plan    PT Assessment Patient needs continued PT services  PT Diagnosis Difficulty walking   PT Problem List Decreased strength;Decreased range of motion;Decreased activity tolerance;Decreased mobility;Decreased knowledge  of use of DME;Pain  PT Treatment Interventions DME instruction;Gait training;Stair training;Functional mobility training;Therapeutic activities;Therapeutic exercise;Patient/family education   PT Goals (Current goals can be found in the Care Plan section) Acute Rehab PT Goals Patient Stated Goal: Walk without my knee buckling PT Goal Formulation: With patient Time For Goal Achievement: 12/20/14 Potential to Achieve Goals: Good    Frequency 7X/week   Barriers to discharge        Co-evaluation               End of Session Equipment Utilized During  Treatment: Gait belt;Right knee immobilizer Activity Tolerance: Patient tolerated treatment well Patient left: in chair;with call bell/phone within reach Nurse Communication: Mobility status         Time: 1440-1510 PT Time Calculation (min) (ACUTE ONLY): 30 min   Charges:   PT Evaluation $Initial PT Evaluation Tier I: 1 Procedure PT Treatments $Gait Training: 8-22 mins   PT G Codes:        Ted Goodner 12-27-14, 3:23 PM

## 2014-12-13 NOTE — Interval H&P Note (Signed)
History and Physical Interval Note:  12/13/2014 7:09 AM  [name]  has presented today for surgery, with the diagnosis of OA RIGHT KNEE  The various methods of treatment have been discussed with the patient and family. After consideration of risks, benefits and other options for treatment, the patient has consented to  Procedure(s): RIGHT TOTAL KNEE ARTHROPLASTY (Right) as a surgical intervention .  The patient's history has been reviewed, patient examined, no change in status, stable for surgery.  I have reviewed the patient's chart and labs.  Questions were answered to the patient's satisfaction.     Gearlean Alf

## 2014-12-13 NOTE — Transfer of Care (Signed)
Immediate Anesthesia Transfer of Care Note  Patient: [name]  Procedure(s) Performed: Procedure(s): RIGHT TOTAL KNEE ARTHROPLASTY (Right)  Patient Location: PACU  Anesthesia Type:MAC and Spinal  Level of Consciousness: awake, alert , oriented and patient cooperative  Airway & Oxygen Therapy: Patient Spontanous Breathing and Patient connected to face mask oxygen  Post-op Assessment: Report given to RN and Post -op Vital signs reviewed and stable  Post vital signs: Reviewed and stable  Last Vitals:  Filed Vitals:   12/13/14 0600  BP: 180/70  Pulse: 72  Temp: 36.6 C  Resp: 18    Complications: No apparent anesthesia complications

## 2014-12-13 NOTE — Anesthesia Postprocedure Evaluation (Signed)
  Anesthesia Post-op Note  Patient: [name]  Procedure(s) Performed: Procedure(s): RIGHT TOTAL KNEE ARTHROPLASTY (Right)  Patient Location: PACU  Anesthesia Type: Spinal/MAC  Level of Consciousness: awake and alert   Airway and Oxygen Therapy: Patient Spontanous Breathing  Post-op Pain: none  Post-op Assessment: Post-op Vital signs reviewed, Patient's Cardiovascular Status Stable and Respiratory Function Stable. No residual motor block.  Post-op Vital Signs: Reviewed  Filed Vitals:   12/13/14 1030  BP: 124/66  Pulse: 56  Temp: 36.2 C  Resp: 11    Complications: No apparent anesthesia complications

## 2014-12-13 NOTE — Progress Notes (Signed)
Clinical Social Work Department BRIEF PSYCHOSOCIAL ASSESSMENT 12/13/2014  Patient:  [name], [name]     Account Number:  [id number]     [place] date:  12/13/2014  Clinical Social Worker:  Lacie Scotts  Date/Time:  12/13/2014 03:34 PM  Referred by:  Physician  Date Referred:  12/13/2014 Referred for  SNF Placement   Other Referral:   Interview type:  Patient Other interview type:    PSYCHOSOCIAL DATA Living Status:  PARENTS Admitted from facility:   Level of care:   Primary support name:  Lanisa Ishler Primary support relationship to patient:  PARENT Degree of support available:   Unable to assist pt . ( 74 yrs old )    CURRENT CONCERNS Current Concerns  Post-Acute Placement   Other Concerns:    SOCIAL WORK ASSESSMENT / PLAN Pt is a 74 yr old female living at home prior to hospitalization. CSW met with pt to assist with d/c planning. This is a planned admission. ST Rehab will most likely be needed following hospital d/c. Pt has made prior arrangements to have her rehab at [facility]. CSW has contacted SNF and d/c plan has been confirmed. Weekend CSW [phone] ) will assist with d/c planning if pt is ready for d/c prior to Monday.   Assessment/plan status:  Psychosocial Support/Ongoing Assessment of Needs Other assessment/ plan:   Information/referral to community resources:   Insurance coverage for SNF and ambulance transport reviewed.    PATIENT'S/FAMILY'S RESPONSE TO PLAN OF CARE: Pt's mood is bright and her pain is being controlled. " I'm looking forward to having rehab at [facility]." Pt is motivated to begin therapy.    Werner Lean LCSW [phone]

## 2014-12-13 NOTE — Anesthesia Procedure Notes (Signed)
Spinal Patient location during procedure: OR Start time: 12/13/2014 7:35 AM End time: 12/13/2014 7:40 AM Staffing Anesthesiologist: Roderic Palau Performed by: anesthesiologist  Preanesthetic Checklist Completed: patient identified, surgical consent, pre-op evaluation, timeout performed, IV checked, risks and benefits discussed and monitors and equipment checked Spinal Block Patient position: sitting Prep: Betadine Patient monitoring: cardiac monitor, continuous pulse ox and blood pressure Approach: midline Location: L3-4 Injection technique: single-shot Needle Needle type: Pencan  Needle gauge: 22 G Needle length: 9 cm Assessment Sensory level: T8 Additional Notes Functioning IV was confirmed and monitors were applied. Sterile prep and drape, including hand hygiene and sterile gloves were used. The patient was positioned and the spine was prepped. The skin was anesthetized with lidocaine.  Free flow of clear CSF was obtained prior to injecting local anesthetic into the CSF.  The spinal needle aspirated freely following injection.  The needle was carefully withdrawn.  The patient tolerated the procedure well.

## 2014-12-13 NOTE — Care Management Note (Signed)
CARE MANAGEMENT NOTE 12/13/2014  Patient:  [name], [name]   Account Number:  [id number]  Date Initiated:  12/13/2014  Documentation initiated by:  Thomson Herbers  Subjective/Objective Assessment:   right Total Knee Arthroplasty     Action/Plan:   tbd   Anticipated DC Date:  12/16/2014   Anticipated DC Plan:  [place] referral  NA      DC Planning Services  CM consult      [facility] Choice  NA   Choice offered to / List presented to:             Status of service:  In process, will continue to follow Medicare Important Message given?   (If response is "NO", the following Medicare IM given date fields will be blank) Date Medicare IM given:   Medicare IM given by:   Date Additional Medicare IM given:   Additional Medicare IM given by:    Discharge Disposition:    Per UR Regulation:  Reviewed for med. necessity/level of care/duration of stay  If discussed at [place] of Stay Meetings, dates discussed:    Comments:  December 13, 2014/Merelin Human L. Rosana Hoes, RN, BSN, CCM. Case Management [place] [phone] No discharge needs present of time of review.

## 2014-12-13 NOTE — Progress Notes (Signed)
Clinical Social Work Department CLINICAL SOCIAL WORK PLACEMENT NOTE 12/13/2014  Patient:  [name], [name]  Account Number:  [id number] [place] date:  12/13/2014  Clinical Social Worker:  Werner Lean, LCSW  Date/time:  12/13/2014 03:32 PM  Clinical Social Work is seeking post-discharge placement for this patient at the following level of care:   SKILLED NURSING   (*CSW will update this form in Epic as items are completed)     Patient/family provided with [place] Department of Clinical Social Work's list of facilities offering this level of care within the geographic area requested by the patient (or if unable, by the patient's family).  12/13/2014  Patient/family informed of their freedom to choose among providers that offer the needed level of care, that participate in Medicare, Medicaid or managed care program needed by the patient, have an available bed and are willing to accept the patient.  12/13/2014  Patient/family informed of [facility]' ownership interest in [facility], as well as of the fact that they are under no obligation to receive care at this facility.  PASARR submitted to EDS on 12/13/2014 PASARR number received on 12/13/2014  FL2 transmitted to all facilities in geographic area requested by pt/family on  12/13/2014 FL2 transmitted to all facilities within larger geographic area on   Patient informed that his/her managed care company has contracts with or will negotiate with  certain facilities, including the following:     Patient/family informed of bed offers received:  12/13/2014 Patient chooses bed at [facility] Physician recommends and patient chooses bed at    Patient to be transferred to  on   Patient to be transferred to facility by  Patient and family notified of transfer on  Name of family member notified:    The following physician request were entered in Epic:   Additional Comments:  Werner Lean LCSW [phone]

## 2014-12-13 NOTE — Op Note (Signed)
Pre-operative diagnosis- Osteoarthritis  Right knee(s)  Post-operative diagnosis- Osteoarthritis Right knee(s)  Procedure-  Right  Total Knee Arthroplasty  Surgeon- Abigail Plover. Elester Apodaca, MD  Assistant- Arlee Muslim, PA-C   Anesthesia-  Spinal  EBL-* No blood loss amount entered *   Drains Hemovac  Tourniquet time-  Total Tourniquet Time Documented: Thigh (Right) - 31 minutes Total: Thigh (Right) - 31 minutes     Complications- None  Condition-PACU - hemodynamically stable.   Brief Clinical Note  [name] is a 74 y.o. year old female with end stage OA of her right knee with progressively worsening pain and dysfunction. She has constant pain, with activity and at rest and significant functional deficits with difficulties even with ADLs. She has had extensive non-op management including analgesics, injections of cortisone and viscosupplements, and home exercise program, but remains in significant pain with significant dysfunction.Radiographs show bone on bone arthritis all 3 compartments. She presents now for right Total Knee Arthroplasty.    Procedure in detail---   The patient is brought into the operating room and positioned supine on the operating table. After successful administration of  Spinal,   a tourniquet is placed high on the  Right thigh(s) and the lower extremity is prepped and draped in the usual sterile fashion. Time out is performed by the operating team and then the  Right lower extremity is wrapped in Esmarch, knee flexed and the tourniquet inflated to 300 mmHg.       A midline incision is made with a ten blade through the subcutaneous tissue to the level of the extensor mechanism. A fresh blade is used to make a medial parapatellar arthrotomy. Soft tissue over the proximal medial tibia is subperiosteally elevated to the joint line with a knife and into the semimembranosus bursa with a Cobb elevator. Soft tissue over the proximal lateral tibia is elevated with  attention being paid to avoiding the patellar tendon on the tibial tubercle. The patella is everted, knee flexed 90 degrees and the ACL and PCL are removed. Findings are bone on bone all 3 compartments with massive global osteophytes.        The drill is used to create a starting hole in the distal femur and the canal is thoroughly irrigated with sterile saline to remove the fatty contents. The 5 degree Right  valgus alignment guide is placed into the femoral canal and the distal femoral cutting block is pinned to remove 10 mm off the distal femur. Resection is made with an oscillating saw.      The tibia is subluxed forward and the menisci are removed. The extramedullary alignment guide is placed referencing proximally at the medial aspect of the tibial tubercle and distally along the second metatarsal axis and tibial crest. The block is pinned to remove 74mm off the more deficient medial  side. Resection is made with an oscillating saw. Size 3is the most appropriate size for the tibia and the proximal tibia is prepared with the modular drill and keel punch for that size.      The femoral sizing guide is placed and size 4  is most appropriate. Rotation is marked off the epicondylar axis and confirmed by creating a rectangular flexion gap at 90 degrees. The size 4 cutting block is pinned in this rotation and the anterior, posterior and chamfer cuts are made with the oscillating saw. The intercondylar block is then placed and that cut is made.      Trial size 3 tibial component,  trial size 4 narrow posterior stabilized femur and a 12.5  mm posterior stabilized rotating platform insert trial is placed. Full extension is achieved with excellent varus/valgus and anterior/posterior balance throughout full range of motion. The patella is everted and thickness measured to be 22  mm. Free hand resection is taken to 12 mm, a 35 template is placed, lug holes are drilled, trial patella is placed, and it tracks normally.  Osteophytes are removed off the posterior femur with the trial in place. All trials are removed and the cut bone surfaces prepared with pulsatile lavage. Cement is mixed and once ready for implantation, the size 3 tibial implant, size  4 narrow posterior stabilized femoral component, and the size 35 patella are cemented in place and the patella is held with the clamp. The trial insert is placed and the knee held in full extension. The Exparel (20 ml mixed with 30 ml saline) and .25% Bupivicaine, are injected into the extensor mechanism, posterior capsule, medial and lateral gutters and subcutaneous tissues.  All extruded cement is removed and once the cement is hard the permanent 12.5 mm posterior stabilized rotating platform insert is placed into the tibial tray.      The wound is copiously irrigated with saline solution and the extensor mechanism closed over a hemovac drain with #1 V-loc suture. The tourniquet is released for a total tourniquet time of 31  minutes. Flexion against gravity is 140 degrees and the patella tracks normally. Subcutaneous tissue is closed with 2.0 vicryl and subcuticular with running 4.0 Monocryl. The incision is cleaned and dried and steri-strips and a bulky sterile dressing are applied. The limb is placed into a knee immobilizer and the patient is awakened and transported to recovery in stable condition.      Please note that a surgical assistant was a medical necessity for this procedure in order to perform it in a safe and expeditious manner. Surgical assistant was necessary to retract the ligaments and vital neurovascular structures to prevent injury to them and also necessary for proper positioning of the limb to allow for anatomic placement of the prosthesis.   Abigail Plover Rissa Turley, MD    12/13/2014, 8:39 AM

## 2014-12-13 NOTE — Anesthesia Preprocedure Evaluation (Signed)
Anesthesia Evaluation  Patient identified by MRN, date of birth, ID band Patient awake    Reviewed: Allergy & Precautions, H&P , NPO status , Patient's Chart, lab work & pertinent test results, reviewed documented beta blocker date and time   Airway Mallampati: III  TM Distance: >3 FB Neck ROM: Full    Dental no notable dental hx. (+) Teeth Intact, Dental Advisory Given   Pulmonary neg pulmonary ROS,  breath sounds clear to auscultation  Pulmonary exam normal       Cardiovascular hypertension, Pt. on medications and Pt. on home beta blockers Rhythm:Regular Rate:Normal     Neuro/Psych negative neurological ROS  negative psych ROS   GI/Hepatic Neg liver ROS, GERD-  Controlled,  Endo/Other  Hypothyroidism   Renal/GU negative Renal ROS  negative genitourinary   Musculoskeletal  (+) Arthritis -, Osteoarthritis,    Abdominal   Peds  Hematology negative hematology ROS (+)   Anesthesia Other Findings   Reproductive/Obstetrics negative OB ROS                             Anesthesia Physical Anesthesia Plan  ASA: II  Anesthesia Plan: MAC and Spinal   Post-op Pain Management:    Induction: Intravenous  Airway Management Planned: Simple Face Mask  Additional Equipment:   Intra-op Plan:   Post-operative Plan:   Informed Consent: I have reviewed the patients History and Physical, chart, labs and discussed the procedure including the risks, benefits and alternatives for the proposed anesthesia with the patient or authorized representative who has indicated his/her understanding and acceptance.   Dental advisory given  Plan Discussed with: CRNA  Anesthesia Plan Comments:         Anesthesia Quick Evaluation

## 2014-12-14 LAB — CBC
HCT: 33.4 % — ABNORMAL LOW (ref 36.0–46.0)
Hemoglobin: 11.2 g/dL — ABNORMAL LOW (ref 12.0–15.0)
MCH: 29.8 pg (ref 26.0–34.0)
MCHC: 33.5 g/dL (ref 30.0–36.0)
MCV: 88.8 fL (ref 78.0–100.0)
PLATELETS: 146 10*3/uL — AB (ref 150–400)
RBC: 3.76 MIL/uL — ABNORMAL LOW (ref 3.87–5.11)
RDW: 13.1 % (ref 11.5–15.5)
WBC: 8.8 10*3/uL (ref 4.0–10.5)

## 2014-12-14 LAB — BASIC METABOLIC PANEL
Anion gap: 7 (ref 5–15)
BUN: 13 mg/dL (ref 6–23)
CO2: 24 mmol/L (ref 19–32)
Calcium: 8.5 mg/dL (ref 8.4–10.5)
Chloride: 104 mmol/L (ref 96–112)
Creatinine, Ser: 0.59 mg/dL (ref 0.50–1.10)
GFR calc Af Amer: >90 mL/min (ref >90)
GFR, EST NON AFRICAN AMERICAN: 89 mL/min — AB (ref >90)
GLUCOSE: 117 mg/dL — AB (ref 70–99)
POTASSIUM: 3.8 mmol/L (ref 3.5–5.1)
SODIUM: 135 mmol/L (ref 135–145)

## 2014-12-14 MED ORDER — SODIUM CHLORIDE 0.9 % IV BOLUS (SEPSIS)
250.0000 mL | Freq: Once | INTRAVENOUS | Status: AC
  Administered 2014-12-14: 250 mL via INTRAVENOUS

## 2014-12-14 NOTE — Progress Notes (Signed)
   Subjective: 1 Day Post-Op Procedure(s) (LRB): RIGHT TOTAL KNEE ARTHROPLASTY (Right) Patient reports pain as mild.   Patient seen in rounds with Dr. Wynelle Link.  Doing fairly well this morning. Patient is well, but has had some minor complaints of pain in the knee, requiring pain medications We will start therapy today.  Plan is to go to [facility] after hospital stay.  Objective: Vital signs in last 24 hours: Temp:  [96.2 F (35.7 C)-98.2 F (36.8 C)] 98.2 F (36.8 C) (04/09 0619) Pulse Rate:  [56-67] 67 (04/09 0619) Resp:  [11-163] 16 (04/09 0619) BP: (99-143)/(39-77) 124/45 mmHg (04/09 0619) SpO2:  [97 %-100 %] 99 % (04/09 0619)  Intake/Output from previous day:  Intake/Output Summary (Last 24 hours) at 12/14/14 0655 Last data filed at 12/14/14 0110  Gross per 24 hour  Intake 4458.75 ml  Output   3400 ml  Net 1058.75 ml    Intake/Output this shift: Total I/O In: 778.8 [P.O.:240; I.V.:538.8] Out: 1000 [Urine:1000]  Labs:  Recent Labs  12/14/14 0435  HGB 11.2*    Recent Labs  12/14/14 0435  WBC 8.8  RBC 3.76*  HCT 33.4*  PLT 146*   No results for input(s): NA, K, CL, CO2, BUN, CREATININE, GLUCOSE, CALCIUM in the last 72 hours. No results for input(s): LABPT, INR in the last 72 hours.  EXAM General - Patient is Alert, Appropriate and Oriented Extremity - Neurovascular intact Sensation intact distally Dorsiflexion/Plantar flexion intact Dressing - dressing C/D/I Motor Function - intact, moving foot and toes well on exam.  Hemovac pulled without difficulty.  Past Medical History  Diagnosis Date  . Hypertension   . Hypothyroidism   . Arthritis   . Hyperlipemia   . Wears glasses   . Complication of anesthesia     woke up too early from a surgery  . Bladder spasms     "spastic bladder" wears pads  . GERD (gastroesophageal reflux disease)     occ. frequent "hiccoughs" after "eating, indigestion"  . Trigger finger, left     alternate fingers  affected. -Occ. shooting pain.  . Cancer     Skin cancer -scalp and eyebrow"basal cell"    Assessment/Plan: 1 Day Post-Op Procedure(s) (LRB): RIGHT TOTAL KNEE ARTHROPLASTY (Right) Principal Problem:   OA (osteoarthritis) of knee  Estimated body mass index is 34.19 kg/(m^2) as calculated from the following:   Height as of this encounter: 5\' 2"  (1.575 m).   Weight as of this encounter: 84.823 kg (187 lb). Advance diet Up with therapy Discharge to SNF - [facility] Monday  DVT Prophylaxis - Xarelto Weight-Bearing as tolerated to right leg D/C O2 and Pulse OX and try on Room Air Up with PT Encourage PO pain meds.  Arlee Muslim, PA-C Orthopaedic Surgery 12/14/2014, 6:55 AM

## 2014-12-14 NOTE — Progress Notes (Signed)
OT Cancellation Note  Patient Details Name: TERRIL AMARO MRN: [id number] DOB: November 21, 1940   Cancelled Treatment:    Reason Eval/Treat Not Completed: Other (comment) will defer OT eval to SNF  Jules Schick  [phone] 12/14/2014, 12:50 PM

## 2014-12-14 NOTE — Progress Notes (Signed)
Physical Therapy Treatment Patient Details Name: [name] MRN: [id number] DOB: 11-28-1940 Today's Date: Jan 08, 2015    History of Present Illness R TKR - s/p Bil THR in 2006    PT Comments    Pt very motivated and progressing well.  Follow Up Recommendations  SNF     Equipment Recommendations  None recommended by PT    Recommendations for Other Services OT consult     Precautions / Restrictions Precautions Precautions: Knee;Fall Required Braces or Orthoses: Knee Immobilizer - Right Knee Immobilizer - Right: Discontinue once straight leg raise with < 10 degree lag Restrictions Weight Bearing Restrictions: No Other Position/Activity Restrictions: WBAT    Mobility  Bed Mobility Overal bed mobility: Needs Assistance Bed Mobility: Sit to Supine       Sit to supine: Min assist   General bed mobility comments: cues for sequence and use of L LE to self assist  Transfers Overall transfer level: Needs assistance Equipment used: Rolling walker (2 wheeled) Transfers: Sit to/from Stand Sit to Stand: Min assist         General transfer comment: cues for LE management and use of UEs to self assist  Ambulation/Gait Ambulation/Gait assistance: Min assist Ambulation Distance (Feet): 95 Feet (and 15' to bathroom) Assistive device: Rolling walker (2 wheeled) Gait Pattern/deviations: Step-to pattern;Decreased step length - right;Decreased step length - left;Shuffle;Trunk flexed Gait velocity: decr   General Gait Details: cues for sequence, posture and position from [employer]            Wheelchair Mobility    Modified Rankin (Stroke Patients Only)       Balance                                    Cognition Arousal/Alertness: Awake/alert Behavior During Therapy: WFL for tasks assessed/performed Overall Cognitive Status: Within Functional Limits for tasks assessed                      Exercises      General Comments         Pertinent Vitals/Pain Pain Assessment: 0-10 Pain Score: 2  Pain Location: R knee Pain Descriptors / Indicators: Aching;Sore Pain Intervention(s): Limited activity within patient's tolerance;Monitored during session;Premedicated before session;Ice applied    Home Living                      Prior Function            PT Goals (current goals can now be found in the care plan section) Acute Rehab PT Goals Patient Stated Goal: Walk without my knee buckling PT Goal Formulation: With patient Time For Goal Achievement: 12/20/14 Potential to Achieve Goals: Good Progress towards PT goals: Progressing toward goals    Frequency  7X/week    PT Plan Current plan remains appropriate    Co-evaluation             End of Session Equipment Utilized During Treatment: Gait belt;Right knee immobilizer Activity Tolerance: Patient tolerated treatment well Patient left: in bed;with call bell/phone within reach     Time: 1418-1455 PT Time Calculation (min) (ACUTE ONLY): 37 min  Charges:  $Gait Training: 23-37 mins                    G Codes:      [name] 08-Jan-2015, 5:20 PM

## 2014-12-14 NOTE — Progress Notes (Signed)
Physical Therapy Treatment Patient Details Name: MIKEL PYON MRN: [id number] DOB: [birth year] Today's Date: 12/14/2014    History of Present Illness R TKR - s/p Bil THR in 2006    PT Comments    Steady progress with mobility.  Follow Up Recommendations  SNF     Equipment Recommendations  None recommended by PT    Recommendations for Other Services OT consult     Precautions / Restrictions Precautions Precautions: Knee;Fall Required Braces or Orthoses: Knee Immobilizer - Right Knee Immobilizer - Right: Discontinue once straight leg raise with < 10 degree lag Restrictions Weight Bearing Restrictions: No Other Position/Activity Restrictions: WBAT    Mobility  Bed Mobility Overal bed mobility: Needs Assistance Bed Mobility: Supine to Sit     Supine to sit: Min assist;Mod assist     General bed mobility comments: cues for sequence and use of L LE to self assist  Transfers Overall transfer level: Needs assistance Equipment used: Rolling walker (2 wheeled) Transfers: Sit to/from Stand Sit to Stand: Min assist;Mod assist         General transfer comment: cues for LE management and use of UEs to self assist  Ambulation/Gait Ambulation/Gait assistance: Min assist;Mod assist Ambulation Distance (Feet): 56 Feet Assistive device: Rolling walker (2 wheeled) Gait Pattern/deviations: Step-to pattern;Decreased step length - right;Decreased step length - left;Shuffle;Trunk flexed Gait velocity: decr   General Gait Details: cues for sequence, posture and position from [employer]            Wheelchair Mobility    Modified Rankin (Stroke Patients Only)       Balance                                    Cognition Arousal/Alertness: Awake/alert Behavior During Therapy: WFL for tasks assessed/performed Overall Cognitive Status: Within Functional Limits for tasks assessed                      Exercises Total Joint Exercises Ankle  Circles/Pumps: AROM;15 reps;Supine;Both Quad Sets: AROM;Both;15 reps;Supine Heel Slides: AAROM;15 reps;Supine;Right Straight Leg Raises: AAROM;Right;Supine;10 reps Goniometric ROM: AAROM at R knee -10 - 40    General Comments        Pertinent Vitals/Pain Pain Assessment: 0-10 Pain Score: 2  Pain Location: R knee Pain Descriptors / Indicators: Aching;Sore Pain Intervention(s): Limited activity within patient's tolerance;Monitored during session;Premedicated before session;Ice applied    Home Living                      Prior Function            PT Goals (current goals can now be found in the care plan section) Acute Rehab PT Goals Patient Stated Goal: Walk without my knee buckling PT Goal Formulation: With patient Time For Goal Achievement: 12/20/14 Potential to Achieve Goals: Good Progress towards PT goals: Progressing toward goals    Frequency  7X/week    PT Plan Current plan remains appropriate    Co-evaluation             End of Session Equipment Utilized During Treatment: Gait belt;Right knee immobilizer Activity Tolerance: Patient tolerated treatment well Patient left: in chair;with call bell/phone within reach     Time: 7628-3151 PT Time Calculation (min) (ACUTE ONLY): 38 min  Charges:  $Gait Training: 8-22 mins $Therapeutic Exercise: 23-37 mins  G Codes:      Amarion Portell 28-Dec-2014, 1:01 PM

## 2014-12-14 NOTE — Plan of Care (Signed)
Problem: Consults Goal: Diagnosis- Total Joint Replacement Outcome: Completed/Met Date Met:  12/14/14 Primary Total Knee RIGHT

## 2014-12-15 LAB — BASIC METABOLIC PANEL
ANION GAP: 5 (ref 5–15)
BUN: 14 mg/dL (ref 6–23)
CALCIUM: 8.4 mg/dL (ref 8.4–10.5)
CO2: 26 mmol/L (ref 19–32)
CREATININE: 0.53 mg/dL (ref 0.50–1.10)
Chloride: 106 mmol/L (ref 96–112)
GFR calc Af Amer: >90 mL/min (ref >90)
Glucose, Bld: 107 mg/dL — ABNORMAL HIGH (ref 70–99)
POTASSIUM: 4.1 mmol/L (ref 3.5–5.1)
Sodium: 137 mmol/L (ref 135–145)

## 2014-12-15 LAB — CBC
HEMATOCRIT: 29.9 % — AB (ref 36.0–46.0)
Hemoglobin: 10.2 g/dL — ABNORMAL LOW (ref 12.0–15.0)
MCH: 30.2 pg (ref 26.0–34.0)
MCHC: 34.1 g/dL (ref 30.0–36.0)
MCV: 88.5 fL (ref 78.0–100.0)
PLATELETS: 141 10*3/uL — AB (ref 150–400)
RBC: 3.38 MIL/uL — ABNORMAL LOW (ref 3.87–5.11)
RDW: 13.4 % (ref 11.5–15.5)
WBC: 9.3 10*3/uL (ref 4.0–10.5)

## 2014-12-15 MED ORDER — MAGNESIUM CITRATE PO SOLN: 1.0000 | Freq: Once | ORAL | Status: DC

## 2014-12-15 NOTE — Progress Notes (Signed)
   Subjective: 2 Days Post-Op Procedure(s) (LRB): RIGHT TOTAL KNEE ARTHROPLASTY (Right) Patient reports pain as mild.   Patient seen in rounds with Dr. Wynelle Link.  Able to get some rest last night. Patient is well, and has had no acute complaints or problems Plan is to go to [facility] after hospital stay.  Objective: Vital signs in last 24 hours: Temp:  [96.6 F (35.9 C)-99.9 F (37.7 C)] 99.7 F (37.6 C) (04/10 0437) Pulse Rate:  [54-67] 67 (04/10 0437) Resp:  [16-20] 19 (04/10 0437) BP: (98-152)/(47-100) 131/55 mmHg (04/10 0437) SpO2:  [99 %-100 %] 100 % (04/10 0437)  Intake/Output from previous day:  Intake/Output Summary (Last 24 hours) at 12/15/14 0713 Last data filed at 12/15/14 0600  Gross per 24 hour  Intake 1499.17 ml  Output   2425 ml  Net -925.83 ml   Labs:  Recent Labs  12/14/14 0435 12/15/14 0515  HGB 11.2* 10.2*    Recent Labs  12/14/14 0435 12/15/14 0515  WBC 8.8 9.3  RBC 3.76* 3.38*  HCT 33.4* 29.9*  PLT 146* 141*    Recent Labs  12/14/14 0435 12/15/14 0515  NA 135 137  K 3.8 4.1  CL 104 106  CO2 24 26  BUN 13 14  CREATININE 0.59 0.53  GLUCOSE 117* 107*  CALCIUM 8.5 8.4   No results for input(s): LABPT, INR in the last 72 hours.  EXAM General - Patient is Alert, Appropriate and Oriented Extremity - Neurovascular intact Sensation intact distally Dorsiflexion/Plantar flexion intact Dressing/Incision - clean, dry, no drainage, healing Motor Function - intact, moving foot and toes well on exam.   Past Medical History  Diagnosis Date  . Hypertension   . Hypothyroidism   . Arthritis   . Hyperlipemia   . Wears glasses   . Complication of anesthesia     woke up too early from a surgery  . Bladder spasms     "spastic bladder" wears pads  . GERD (gastroesophageal reflux disease)     occ. frequent "hiccoughs" after "eating, indigestion"  . Trigger finger, left     alternate fingers affected. -Occ. shooting pain.  . Cancer     Skin cancer -scalp and eyebrow"basal cell"    Assessment/Plan: 2 Days Post-Op Procedure(s) (LRB): RIGHT TOTAL KNEE ARTHROPLASTY (Right) Principal Problem:   OA (osteoarthritis) of knee  Estimated body mass index is 34.19 kg/(m^2) as calculated from the following:   Height as of this encounter: 5\' 2"  (1.575 m).   Weight as of this encounter: 84.823 kg (187 lb). Up with therapy Plan for discharge tomorrow Discharge to SNF  DVT Prophylaxis - Xarelto Weight-Bearing as tolerated to right leg Transfer to [facility] on Monday  Arlee Muslim, PA-C Orthopaedic Surgery 12/15/2014, 7:13 AM

## 2014-12-15 NOTE — Care Management Note (Signed)
CARE MANAGEMENT NOTE 12/15/2014  Patient:  [name], [name]   Account Number:  [id number]  Date Initiated:  12/13/2014  Documentation initiated by:  DAVIS,RHONDA  Subjective/Objective Assessment:   right Total Knee Arthroplasty     Action/Plan:   tbd   Anticipated DC Date:  12/16/2014   Anticipated DC Plan:  [place] referral  Clinical Social Worker      DC Planning Services  CM consult      [facility] Choice  NA   Choice offered to / List presented to:             Status of service:  Completed, signed off Medicare Important Message given?   (If response is "NO", the following Medicare IM given date fields will be blank) Date Medicare IM given:   Medicare IM given by:   Date Additional Medicare IM given:   Additional Medicare IM given by:    Discharge Disposition:  [place]  Per UR Regulation:  Reviewed for med. necessity/level of care/duration of stay  If discussed at [place] of Stay Meetings, dates discussed:    Comments:  12/15/14 1050 - CM spoke with patient at the bedside. Plans to discharge to [facility]. Venita Sheffield RN BSN CCM [phone]  December 13, 2014/Rhonda L. Rosana Hoes, RN, BSN, CCM. Case Management [place] [phone] No discharge needs present of time of review.

## 2014-12-15 NOTE — Discharge Summary (Signed)
Physician Discharge Summary   Patient ID: [name] MRN: [id number] DOB/AGE: 73/13/1942 73 y.o.  Admit date: 12/13/2014 Discharge date: 12-16-2014  Primary Diagnosis:  Osteoarthritis Right knee(s)  Admission Diagnoses:  Past Medical History  Diagnosis Date  . Hypertension   . Hypothyroidism   . Arthritis   . Hyperlipemia   . Wears glasses   . Complication of anesthesia     woke up too early from a surgery  . Bladder spasms     "spastic bladder" wears pads  . GERD (gastroesophageal reflux disease)     occ. frequent "hiccoughs" after "eating, indigestion"  . Trigger finger, left     alternate fingers affected. -Occ. shooting pain.  . Cancer     Skin cancer -scalp and eyebrow"basal cell"   Discharge Diagnoses:   Principal Problem:   OA (osteoarthritis) of knee  Estimated body mass index is 34.19 kg/(m^2) as calculated from the following:   Height as of this encounter: 5' 2" (1.575 m).   Weight as of this encounter: 84.823 kg (187 lb).  Procedure:  Procedure(s) (LRB): RIGHT TOTAL KNEE ARTHROPLASTY (Right)   Consults: None  HPI: [name] is a 74 y.o. year old female with end stage OA of her right knee with progressively worsening pain and dysfunction. She has constant pain, with activity and at rest and significant functional deficits with difficulties even with ADLs. She has had extensive non-op management including analgesics, injections of cortisone and viscosupplements, and home exercise program, but remains in significant pain with significant dysfunction.Radiographs show bone on bone arthritis all 3 compartments. She presents now for right Total Knee Arthroplasty.   Laboratory Data: Admission on 12/13/2014  Component Date Value Ref Range Status  . WBC 12/14/2014 8.8  4.0 - 10.5 K/uL Final  . RBC 12/14/2014 3.76* 3.87 - 5.11 MIL/uL Final  . Hemoglobin 12/14/2014 11.2* 12.0 - 15.0 g/dL Final  . HCT 12/14/2014 33.4* 36.0 - 46.0 % Final  . MCV 12/14/2014  88.8  78.0 - 100.0 fL Final  . MCH 12/14/2014 29.8  26.0 - 34.0 pg Final  . MCHC 12/14/2014 33.5  30.0 - 36.0 g/dL Final  . RDW 12/14/2014 13.1  11.5 - 15.5 % Final  . Platelets 12/14/2014 146* 150 - 400 K/uL Final  . Sodium 12/14/2014 135  135 - 145 mmol/L Final  . Potassium 12/14/2014 3.8  3.5 - 5.1 mmol/L Final  . Chloride 12/14/2014 104  96 - 112 mmol/L Final  . CO2 12/14/2014 24  19 - 32 mmol/L Final  . Glucose, Bld 12/14/2014 117* 70 - 99 mg/dL Final  . BUN 12/14/2014 13  6 - 23 mg/dL Final  . Creatinine, Ser 12/14/2014 0.59  0.50 - 1.10 mg/dL Final  . Calcium 12/14/2014 8.5  8.4 - 10.5 mg/dL Final  . GFR calc non Af Amer 12/14/2014 89* >90 mL/min Final  . GFR calc Af Amer 12/14/2014 >90  >90 mL/min Final   Comment: (NOTE) The eGFR has been calculated using the CKD EPI equation. This calculation has not been validated in all clinical situations. eGFR's persistently <90 mL/min signify possible Chronic Kidney Disease.   . Anion gap 12/14/2014 7  5 - 15 Final  . WBC 12/15/2014 9.3  4.0 - 10.5 K/uL Final  . RBC 12/15/2014 3.38* 3.87 - 5.11 MIL/uL Final  . Hemoglobin 12/15/2014 10.2* 12.0 - 15.0 g/dL Final  . HCT 12/15/2014 29.9* 36.0 - 46.0 % Final  . MCV 12/15/2014 88.5  78.0 - 100.0 fL  Final  . MCH 12/15/2014 30.2  26.0 - 34.0 pg Final  . MCHC 12/15/2014 34.1  30.0 - 36.0 g/dL Final  . RDW 12/15/2014 13.4  11.5 - 15.5 % Final  . Platelets 12/15/2014 141* 150 - 400 K/uL Final  . Sodium 12/15/2014 137  135 - 145 mmol/L Final  . Potassium 12/15/2014 4.1  3.5 - 5.1 mmol/L Final  . Chloride 12/15/2014 106  96 - 112 mmol/L Final  . CO2 12/15/2014 26  19 - 32 mmol/L Final  . Glucose, Bld 12/15/2014 107* 70 - 99 mg/dL Final  . BUN 12/15/2014 14  6 - 23 mg/dL Final  . Creatinine, Ser 12/15/2014 0.53  0.50 - 1.10 mg/dL Final  . Calcium 12/15/2014 8.4  8.4 - 10.5 mg/dL Final  . GFR calc non Af Amer 12/15/2014 >90  >90 mL/min Final  . GFR calc Af Amer 12/15/2014 >90  >90 mL/min Final     Comment: (NOTE) The eGFR has been calculated using the CKD EPI equation. This calculation has not been validated in all clinical situations. eGFR's persistently <90 mL/min signify possible Chronic Kidney Disease.   . Anion gap 12/15/2014 5  5 - 15 Final  . WBC 12/16/2014 7.9  4.0 - 10.5 K/uL Final  . RBC 12/16/2014 3.17* 3.87 - 5.11 MIL/uL Final  . Hemoglobin 12/16/2014 9.4* 12.0 - 15.0 g/dL Final  . HCT 12/16/2014 28.2* 36.0 - 46.0 % Final  . MCV 12/16/2014 89.0  78.0 - 100.0 fL Final  . MCH 12/16/2014 29.7  26.0 - 34.0 pg Final  . MCHC 12/16/2014 33.3  30.0 - 36.0 g/dL Final  . RDW 12/16/2014 13.2  11.5 - 15.5 % Final  . Platelets 12/16/2014 127* 150 - 400 K/uL Final  Hospital Outpatient Visit on 12/06/2014  Component Date Value Ref Range Status  . aPTT 12/06/2014 32  24 - 37 seconds Final  . WBC 12/06/2014 5.1  4.0 - 10.5 K/uL Final  . RBC 12/06/2014 4.53  3.87 - 5.11 MIL/uL Final  . Hemoglobin 12/06/2014 13.3  12.0 - 15.0 g/dL Final  . HCT 12/06/2014 40.9  36.0 - 46.0 % Final  . MCV 12/06/2014 90.3  78.0 - 100.0 fL Final  . MCH 12/06/2014 29.4  26.0 - 34.0 pg Final  . MCHC 12/06/2014 32.5  30.0 - 36.0 g/dL Final  . RDW 12/06/2014 13.4  11.5 - 15.5 % Final  . Platelets 12/06/2014 192  150 - 400 K/uL Final  . Sodium 12/06/2014 141  135 - 145 mmol/L Final  . Potassium 12/06/2014 4.8  3.5 - 5.1 mmol/L Final  . Chloride 12/06/2014 106  96 - 112 mmol/L Final  . CO2 12/06/2014 27  19 - 32 mmol/L Final  . Glucose, Bld 12/06/2014 77  70 - 99 mg/dL Final  . BUN 12/06/2014 22  6 - 23 mg/dL Final  . Creatinine, Ser 12/06/2014 0.64  0.50 - 1.10 mg/dL Final  . Calcium 12/06/2014 9.7  8.4 - 10.5 mg/dL Final  . Total Protein 12/06/2014 6.7  6.0 - 8.3 g/dL Final  . Albumin 12/06/2014 3.9  3.5 - 5.2 g/dL Final  . AST 12/06/2014 21  0 - 37 U/L Final  . ALT 12/06/2014 20  0 - 35 U/L Final  . Alkaline Phosphatase 12/06/2014 81  39 - 117 U/L Final  . Total Bilirubin 12/06/2014 0.6  0.3 -  1.2 mg/dL Final  . GFR calc non Af Amer 12/06/2014 87* >90 mL/min Final  . GFR calc Af  Amer 12/06/2014 >90  >90 mL/min Final   Comment: (NOTE) The eGFR has been calculated using the CKD EPI equation. This calculation has not been validated in all clinical situations. eGFR's persistently <90 mL/min signify possible Chronic Kidney Disease.   . Anion gap 12/06/2014 8  5 - 15 Final  . Prothrombin Time 12/06/2014 13.6  11.6 - 15.2 seconds Final  . INR 12/06/2014 1.03  0.00 - 1.49 Final  . ABO/RH(D) 12/06/2014 O NEG   Final  . Antibody Screen 12/06/2014 NEG   Final  . Sample Expiration 12/06/2014 12/16/2014   Final  . Color, Urine 12/06/2014 YELLOW  YELLOW Final  . APPearance 12/06/2014 CLEAR  CLEAR Final  . Specific Gravity, Urine 12/06/2014 1.019  1.005 - 1.030 Final  . pH 12/06/2014 7.0  5.0 - 8.0 Final  . Glucose, UA 12/06/2014 NEGATIVE  NEGATIVE mg/dL Final  . Hgb urine dipstick 12/06/2014 NEGATIVE  NEGATIVE Final  . Bilirubin Urine 12/06/2014 NEGATIVE  NEGATIVE Final  . Ketones, ur 12/06/2014 NEGATIVE  NEGATIVE mg/dL Final  . Protein, ur 12/06/2014 NEGATIVE  NEGATIVE mg/dL Final  . Urobilinogen, UA 12/06/2014 0.2  0.0 - 1.0 mg/dL Final  . Nitrite 12/06/2014 NEGATIVE  NEGATIVE Final  . Leukocytes, UA 12/06/2014 NEGATIVE  NEGATIVE Final   MICROSCOPIC NOT DONE ON URINES WITH NEGATIVE PROTEIN, BLOOD, LEUKOCYTES, NITRITE, OR GLUCOSE <1000 mg/dL.  Marland Kitchen MRSA, PCR 12/06/2014 NEGATIVE  NEGATIVE Final  . Staphylococcus aureus 12/06/2014 POSITIVE* NEGATIVE Final   Comment:        The Xpert SA Assay (FDA approved for NASAL specimens in patients over 78 years of age), is one component of a comprehensive surveillance program.  Test performance has been validated by [facility] for patients greater than or equal to 49 year old. It is not intended to diagnose infection nor to guide or monitor treatment.   . ABO/RH(D) 12/06/2014 O NEG   Final     X-Rays:Dg Bone Density  11/19/2014   EXAM:  DUAL X-RAY ABSORPTIOMETRY (DXA) FOR BONE MINERAL DENSITY  IMPRESSION: Ordering Physician:  Dr. Caren Macadam,  Your patient Dwan Bolt completed a BMD test on 11/19/2014 using the [place] (software version: 14.10) manufactured by [employer]. The following summarizes the results of our evaluation. PATIENT BIOGRAPHICAL: Name: SAYRE, MAZOR Patient ID: [id number] Birth Date: 07/31/1941 Height: 62.0 in. Gender: Female Exam Date: 11/19/2014 Weight: 188.0 lbs. Indications: Advanced Age, Bilateral Oophrectomy, Caucasian, Follow up Osteopenia, Height Loss, Post Menopausal Fractures: Finger Treatments: Calcium, Vitamin D DENSITOMETRY RESULTS: Site         Region     Measured Date Measured Age WHO Classification Young Adult T-score BMD         %Change vs. Previous Significant Change (*) AP Spine L1-L4 (L2) 11[age]19/2014 72.4 Normal -0.4 1.119 g/cm2 13.5% Yes AP Spine L1-L4 (L2) 06[age]23/2010 69.0 Osteopenia -1.5 0.986 g/cm2 0.5% - AP Spine L1-L4 (L2) 02[age]17/2004 61.7 Osteopenia -1.6 0.981 g/cm2 - -  Left Forearm Radius 33% 11/19/2014 72.4 Normal -1.0 0.641 g/cm2 -9.2% Yes Left Forearm Radius 33% 06/23/2010 69.0 Normal -0.1 0.706 g/cm2 - - ASSESSMENT: BMD as determined from Forearm Radius 33% is 0.641 g/cm2 with a T-Score of -1.0. This patient is considered normal according to [facility]) criteria. Compared with the prior study on 06/23/2010, the BMD of the lumbar spine shows a statistically significant increase. Compared with the prior study on 06/23/2010, the BMD of the left forearm shows a statistically significant decrease. (L-2  was excluded due to advanced degenerative changes.) (Patient does not meet criteria for FRAX assessment.)  World Health Organization [facility]) criteria for post-menopausal, Caucasian Women: Normal:       T-score at or above -1 SD Osteopenia:   T-score between -1 and -2.5 SD Osteoporosis: T-score at or below -2.5 SD  RECOMMENDATIONS: [place] recommends that FDA-approved medial therapies be considered in postmenopausal women and men age 68 or older with a: 1. Hip or vertberal (clinical or morphometric) fracture. 2. T-Score of < -2.5 at the spine or hip. 3. Ten-year fracture probability by FRAX of 3% or greater for hip fracture or 20% or greater for major osteoporotic fracture.  All treatment decisions require clinical judgment and consideration of indiviual patient factors, including patient preferences, co-morbidities, previous drug use, risk factors not captured in the FRAX model (e.g. falls, vitamin D deficiency, increased bone turnover, interval significant decline in bone density) and possible under-or over-estimation of fracture risk by FRAX.  All patients should ensure an adequate intake of dietary calcium (1200 mg/d) and vitamin D (800 IU daily) unless contraindicated.  FOLLOW-UP: People with diagnosed cases of osteoporosis or osteopenia should be regularly tested for bone mineral density. For patients eligible for Medicare, routine testing is allowed once every 2 years. Testing frequency can be increased for patients who have rapidly progressing disease, or for those who are receiving medical therapy to restore bone mass. I have reviewed this report, and agree with the above findings.  [facility]   Electronically Signed   By: David  Martinique   On: 11/19/2014 11:51    EKG: Orders placed or performed during the hospital encounter of 06/25/13  . EKG 12 lead  . EKG 12 lead  . EKG     Hospital Course: [name] is a 74 y.o. who was admitted to [facility]. They were brought to the operating room on 12/13/2014 and underwent Procedure(s): RIGHT TOTAL KNEE ARTHROPLASTY.  Patient tolerated the procedure well and was later transferred to the recovery room and then to the orthopaedic floor for postoperative care.  They were given PO and IV analgesics for pain control following their surgery.   They were given 24 hours of postoperative antibiotics of  Anti-infectives    Start     Dose/Rate Route Frequency Ordered Stop   12/13/14 1300  ceFAZolin (ANCEF) IVPB 2 g/50 mL premix     2 g 100 mL/hr over 30 Minutes Intravenous Every 6 hours 12/13/14 1122 12/13/14 1811   12/13/14 0730  ceFAZolin (ANCEF) IVPB 2 g/50 mL premix     2 g 100 mL/hr over 30 Minutes Intravenous 30 min pre-op 12/13/14 0722 12/13/14 0736   12/13/14 0630  vancomycin (VANCOCIN) IVPB 1000 mg/200 mL premix     1,000 mg 200 mL/hr over 60 Minutes Intravenous  Once 12/13/14 0619     12/10/14 0954  ceFAZolin (ANCEF) IVPB 2 g/50 mL premix  Status:  Discontinued     2 g 100 mL/hr over 30 Minutes Intravenous On call to O.R. 12/10/14 8546 12/10/14 0954     and started on DVT prophylaxis in the form of Xarelto.   PT and OT were ordered for total joint protocol.  Discharge planning consulted to help with postop disposition and equipment needs.  Social worker consulted to assist with placement of the patient.  She wanted to look into [facility].  Patient had a decent night on the evening of surgery and doing fairly well the  next morning.  They started to get up OOB with therapy on day one. Hemovac drain was pulled without difficulty.  Continued to work with therapy into day two.  Dressing was changed on day two and the incision was healing well.  By day three, the patient had progressed with therapy and meeting their goals.  Incision was healing well.  Patient was seen in rounds on day three and was ready to go to [facility].   Diet: Cardiac diet Activity:WBAT Follow-up:in 2 weeks Disposition - [facility] Discharged Condition: good       Discharge Instructions    Call MD / Call 911    Complete by:  As directed   If you experience chest pain or shortness of breath, CALL 911 and be transported to the hospital emergency room.  If you develope a fever above 101 F, pus (white drainage) or increased  drainage or redness at the wound, or calf pain, call your surgeon's office.     Change dressing    Complete by:  As directed   Change dressing daily with sterile 4 x 4 inch gauze dressing and apply TED hose. Do not submerge the incision under water.     Constipation Prevention    Complete by:  As directed   Drink plenty of fluids.  Prune juice may be helpful.  You may use a stool softener, such as Colace (over the counter) 100 mg twice a day.  Use MiraLax (over the counter) for constipation as needed.     Diet - low sodium heart healthy    Complete by:  As directed      Discharge instructions    Complete by:  As directed   Pick up stool softner and laxative for home use following surgery while on pain medications. Do not submerge incision under water. Please use good hand washing techniques while changing dressing each day. May shower starting three days after surgery. Please use a clean towel to pat the incision dry following showers. Continue to use ice for pain and swelling after surgery. Do not use any lotions or creams on the incision until instructed by your surgeon.  Take Xarelto for two and a half more weeks, then discontinue Xarelto. Once the patient has completed the Xarelto, they may resume the 325 mg Aspirin.  Postoperative Constipation Protocol  Constipation - defined medically as fewer than three stools per week and severe constipation as less than one stool per week.  One of the most common issues patients have following surgery is constipation.  Even if you have a regular bowel pattern at home, your normal regimen is likely to be disrupted due to multiple reasons following surgery.  Combination of anesthesia, postoperative narcotics, change in appetite and fluid intake all can affect your bowels.  In order to avoid complications following surgery, here are some recommendations in order to help you during your recovery period.  Colace (docusate) - Pick up an  over-the-counter form of Colace or another stool softener and take twice a day as long as you are requiring postoperative pain medications.  Take with a full glass of water daily.  If you experience loose stools or diarrhea, hold the colace until you stool forms back up.  If your symptoms do not get better within 1 week or if they get worse, check with your doctor.  Dulcolax (bisacodyl) - Pick up over-the-counter and take as directed by the product packaging as needed to assist with the  movement of your bowels.  Take with a full glass of water.  Use this product as needed if not relieved by Colace only.   MiraLax (polyethylene glycol) - Pick up over-the-counter to have on hand.  MiraLax is a solution that will increase the amount of water in your bowels to assist with bowel movements.  Take as directed and can mix with a glass of water, juice, soda, coffee, or tea.  Take if you go more than two days without a movement. Do not use MiraLax more than once per day. Call your doctor if you are still constipated or irregular after using this medication for 7 days in a row.  If you continue to have problems with postoperative constipation, please contact the office for further assistance and recommendations.  If you experience "the worst abdominal pain ever" or develop nausea or vomiting, please contact the office immediatly for further recommendations for treatment.  When discharged from the skilled rehab facility, please have the facility set up the patient's [place] prior to being released.  Please make sure this gets set up prior to release in order to avoid any lapse of therapy following the rehab stay.  Also provide the patient with their medications at time of release from the facility to include their pain medication, the muscle relaxants, and their blood thinner medication.  If the patient is still at the rehab facility at time of follow up appointment, please also assist the patient  in arranging follow up appointment in our office and any transportation needs. ICE to the affected knee or hip every three hours for 30 minutes at a time and then as needed for pain and swelling.     Do not put a pillow under the knee. Place it under the heel.    Complete by:  As directed      Do not sit on low chairs, stoools or toilet seats, as it may be difficult to get up from low surfaces    Complete by:  As directed      Driving restrictions    Complete by:  As directed   No driving until released by the physician.     Increase activity slowly as tolerated    Complete by:  As directed      Lifting restrictions    Complete by:  As directed   No lifting until released by the physician.     Patient may shower    Complete by:  As directed   You may shower without a dressing once there is no drainage.  Do not wash over the wound.  If drainage remains, do not shower until drainage stops.     TED hose    Complete by:  As directed   Use stockings (TED hose) for 3 weeks on both leg(s).  You may remove them at night for sleeping.     Weight bearing as tolerated    Complete by:  As directed   Laterality:  right  Extremity:  Lower            Medication List    STOP taking these medications        amoxicillin 875 MG tablet  Commonly known as:  AMOXIL     aspirin 325 MG EC tablet     calcium citrate-vitamin D 315-200 MG-UNIT per tablet  Commonly known as:  CITRACAL+D     glucosamine-chondroitin 500-400 MG tablet     oxyCODONE-acetaminophen 5-325 MG per tablet  Commonly known as:  ROXICET      TAKE these medications        acetaminophen 325 MG tablet  Commonly known as:  TYLENOL  Take 2 tablets (650 mg total) by mouth every 6 (six) hours as needed for mild pain (or Fever >/= 101).     bisacodyl 10 MG suppository  Commonly known as:  DULCOLAX  Place 1 suppository (10 mg total) rectally daily as needed for moderate constipation.     docusate sodium 100 MG capsule   Commonly known as:  COLACE  Take 1 capsule (100 mg total) by mouth 2 (two) times daily.     fesoterodine 8 MG Tb24 tablet  Commonly known as:  TOVIAZ  Take 8 mg by mouth daily. Takes at bedtime     levothyroxine 125 MCG tablet  Commonly known as:  SYNTHROID, LEVOTHROID  Take 125 mcg by mouth daily before breakfast.     methocarbamol 500 MG tablet  Commonly known as:  ROBAXIN  Take 1 tablet (500 mg total) by mouth every 6 (six) hours as needed for muscle spasms.     metoCLOPramide 5 MG tablet  Commonly known as:  REGLAN  Take 1 tablet (5 mg total) by mouth every 8 (eight) hours as needed for nausea (if ondansetron (ZOFRAN) ineffective.).     metoprolol 50 MG tablet  Commonly known as:  LOPRESSOR  Take 50 mg by mouth 2 (two) times daily.     ondansetron 4 MG tablet  Commonly known as:  ZOFRAN  Take 1 tablet (4 mg total) by mouth every 6 (six) hours as needed for nausea.     oxyCODONE 5 MG immediate release tablet  Commonly known as:  Oxy IR/ROXICODONE  Take 1-2 tablets (5-10 mg total) by mouth every 3 (three) hours as needed for moderate pain, severe pain or breakthrough pain.     polyethylene glycol packet  Commonly known as:  MIRALAX / GLYCOLAX  Take 17 g by mouth daily as needed for mild constipation.     PRESERVISION AREDS 2 PO  Take 1 tablet by mouth 2 (two) times daily.     rivaroxaban 10 MG Tabs tablet  Commonly known as:  XARELTO  - Take 1 tablet (10 mg total) by mouth daily with breakfast. Take Xarelto for two and a half more weeks, then discontinue Xarelto.  - Once the patient has completed the Xarelto, they may resume the 325 mg Aspirin.     simvastatin 10 MG tablet  Commonly known as:  ZOCOR  Take 10 mg by mouth at bedtime.     traMADol 50 MG tablet  Commonly known as:  ULTRAM  Take 1-2 tablets (50-100 mg total) by mouth every 6 (six) hours as needed (mild pain).       Follow-up Information    Follow up with Gearlean Alf, MD. Schedule an  appointment as soon as possible for a visit on 12/24/2014.   Specialty:  Orthopedic Surgery   Why:  Call office ASAP at [phone] to setup follow up appointment with Dr. Wynelle Link on 12/24/2014.   Contact information:   [address] [place] [address] [phone]       Signed: Arlee Muslim, PA-C Orthopaedic Surgery 12/16/2014, 10:02 AM

## 2014-12-15 NOTE — Discharge Instructions (Addendum)
Dr. Gaynelle Arabian Total Joint Specialist [facility] [address] [place], [place] [address] [phone]  TOTAL KNEE REPLACEMENT POSTOPERATIVE DIRECTIONS  Knee Rehabilitation, Guidelines Following Surgery  Results after knee surgery are often greatly improved when you follow the exercise, range of motion and muscle strengthening exercises prescribed by your doctor. Safety measures are also important to protect the knee from further injury. Any time any of these exercises cause you to have increased pain or swelling in your knee joint, decrease the amount until you are comfortable again and slowly increase them. If you have problems or questions, call your caregiver or physical therapist for advice.   HOME CARE INSTRUCTIONS  Remove items at home which could result in a fall. This includes throw rugs or furniture in walking pathways.   ICE to the affected knee every three hours for 30 minutes at a time and then as needed for pain and swelling.  Continue to use ice on the knee for pain and swelling from surgery. You may notice swelling that will progress down to the foot and ankle.  This is normal after surgery.  Elevate the leg when you are not up walking on it.    Continue to use the breathing machine which will help keep your temperature down.  It is common for your temperature to cycle up and down following surgery, especially at night when you are not up moving around and exerting yourself.  The breathing machine keeps your lungs expanded and your temperature down.  Do not place pillow under knee, focus on keeping the knee straight while resting  DIET You may resume your previous home diet once your are discharged from the hospital.  DRESSING / WOUND CARE / SHOWERING You may change your dressing 3-5 days after surgery.  Then change the dressing every day with sterile gauze.  Please use good hand washing techniques before changing the dressing.  Do not use any  lotions or creams on the incision until instructed by your surgeon. You may start showering once you are discharged home but do not submerge the incision under water. Just pat the incision dry and apply a dry gauze dressing on daily. Change the surgical dressing daily and reapply a dry dressing each time.  ACTIVITY Walk with your walker as instructed. Use walker as long as suggested by your caregivers. Avoid periods of inactivity such as sitting longer than an hour when not asleep. This helps prevent blood clots.  You may resume a sexual relationship in one month or when given the OK by your doctor.  You may return to work once you are cleared by your doctor.  Do not drive a car for 6 weeks or until released by you surgeon.  Do not drive while taking narcotics.  WEIGHT BEARING Weight bearing as tolerated with assist device (walker, cane, etc) as directed, use it as long as suggested by your surgeon or therapist, typically at least 4-6 weeks.  POSTOPERATIVE CONSTIPATION PROTOCOL Constipation - defined medically as fewer than three stools per week and severe constipation as less than one stool per week.  One of the most common issues patients have following surgery is constipation.  Even if you have a regular bowel pattern at home, your normal regimen is likely to be disrupted due to multiple reasons following surgery.  Combination of anesthesia, postoperative narcotics, change in appetite and fluid intake all can affect your bowels.  In order to avoid complications following surgery, here are some recommendations  in order to help you during your recovery period.  Colace (docusate) - Pick up an over-the-counter form of Colace or another stool softener and take twice a day as long as you are requiring postoperative pain medications.  Take with a full glass of water daily.  If you experience loose stools or diarrhea, hold the colace until you stool forms back up.  If your symptoms do not get better  within 1 week or if they get worse, check with your doctor.  Dulcolax (bisacodyl) - Pick up over-the-counter and take as directed by the product packaging as needed to assist with the movement of your bowels.  Take with a full glass of water.  Use this product as needed if not relieved by Colace only.   MiraLax (polyethylene glycol) - Pick up over-the-counter to have on hand.  MiraLax is a solution that will increase the amount of water in your bowels to assist with bowel movements.  Take as directed and can mix with a glass of water, juice, soda, coffee, or tea.  Take if you go more than two days without a movement. Do not use MiraLax more than once per day. Call your doctor if you are still constipated or irregular after using this medication for 7 days in a row.  If you continue to have problems with postoperative constipation, please contact the office for further assistance and recommendations.  If you experience "the worst abdominal pain ever" or develop nausea or vomiting, please contact the office immediatly for further recommendations for treatment.  ITCHING  If you experience itching with your medications, try taking only a single pain pill, or even half a pain pill at a time.  You can also use Benadryl over the counter for itching or also to help with sleep.   TED HOSE STOCKINGS Wear the elastic stockings on both legs for three weeks following surgery during the day but you may remove then at night for sleeping.  MEDICATIONS See your medication summary on the After Visit Summary that the nursing staff will review with you prior to discharge.  You may have some home medications which will be placed on hold until you complete the course of blood thinner medication.  It is important for you to complete the blood thinner medication as prescribed by your surgeon.  Continue your approved medications as instructed at time of discharge.  PRECAUTIONS If you experience chest pain or shortness  of breath - call 911 immediately for transfer to the hospital emergency department.  If you develop a fever greater that 101 F, purulent drainage from wound, increased redness or drainage from wound, foul odor from the wound/dressing, or calf pain - CONTACT YOUR SURGEON.                                                   FOLLOW-UP APPOINTMENTS Make sure you keep all of your appointments after your operation with your surgeon and caregivers. You should call the office at the above phone number and make an appointment for approximately two weeks after the date of your surgery or on the date instructed by your surgeon outlined in the "After Visit Summary".   RANGE OF MOTION AND STRENGTHENING EXERCISES  Rehabilitation of the knee is important following a knee injury or an operation. After just a few days of immobilization, the muscles  of the thigh which control the knee become weakened and shrink (atrophy). Knee exercises are designed to build up the tone and strength of the thigh muscles and to improve knee motion. Often times heat used for twenty to thirty minutes before working out will loosen up your tissues and help with improving the range of motion but do not use heat for the first two weeks following surgery. These exercises can be done on a training (exercise) mat, on the floor, on a table or on a bed. Use what ever works the best and is most comfortable for you Knee exercises include:  Leg Lifts - While your knee is still immobilized in a splint or cast, you can do straight leg raises. Lift the leg to 60 degrees, hold for 3 sec, and slowly lower the leg. Repeat 10-20 times 2-3 times daily. Perform this exercise against resistance later as your knee gets better.  Quad and Hamstring Sets - Tighten up the muscle on the front of the thigh (Quad) and hold for 5-10 sec. Repeat this 10-20 times hourly. Hamstring sets are done by pushing the foot backward against an object and holding for 5-10 sec. Repeat as  with quad sets.   Leg Slides: Lying on your back, slowly slide your foot toward your buttocks, bending your knee up off the floor (only go as far as is comfortable). Then slowly slide your foot back down until your leg is flat on the floor again.  Angel Wings: Lying on your back spread your legs to the side as far apart as you can without causing discomfort.  A rehabilitation program following serious knee injuries can speed recovery and prevent re-injury in the future due to weakened muscles. Contact your doctor or a physical therapist for more information on knee rehabilitation.   IF YOU ARE TRANSFERRED TO A SKILLED REHAB FACILITY If the patient is transferred to a skilled rehab facility following release from the hospital, a list of the current medications will be sent to the facility for the patient to continue.  When discharged from the skilled rehab facility, please have the facility set up the patient's [place] prior to being released. Also, the skilled facility will be responsible for providing the patient with their medications at time of release from the facility to include their pain medication, the muscle relaxants, and their blood thinner medication. If the patient is still at the rehab facility at time of the two week follow up appointment, the skilled rehab facility will also need to assist the patient in arranging follow up appointment in our office and any transportation needs.  MAKE SURE YOU:  Understand these instructions.  Get help right away if you are not doing well or get worse.    Pick up stool softner and laxative for home use following surgery while on pain medications. Do not submerge incision under water. Please use good hand washing techniques while changing dressing each day. May shower starting three days after surgery. Please use a clean towel to pat the incision dry following showers. Continue to use ice for pain and swelling after  surgery. Do not use any lotions or creams on the incision until instructed by your surgeon.   Take Xarelto for two and a half more weeks, then discontinue Xarelto. Once the patient has completed the Xarelto, they may resume the 325 mg Aspirin.   Information on my medicine - XARELTO (Rivaroxaban)  This medication education was reviewed with me or my healthcare  representative as part of my discharge preparation.  The pharmacist that spoke with me during my hospital stay was:  Emiliano Dyer, RPH  Why was Xarelto prescribed for you? Xarelto was prescribed for you to reduce the risk of blood clots forming after orthopedic surgery. The medical term for these abnormal blood clots is venous thromboembolism (VTE).  What do you need to know about xarelto ? Take your Xarelto ONCE DAILY at the same time every day. You may take it either with or without food.  If you have difficulty swallowing the tablet whole, you may crush it and mix in applesauce just prior to taking your dose.  Take Xarelto exactly as prescribed by your doctor and DO NOT stop taking Xarelto without talking to the doctor who prescribed the medication.  Stopping without other VTE prevention medication to take the place of Xarelto may increase your risk of developing a clot.  After discharge, you should have regular check-up appointments with your healthcare provider that is prescribing your Xarelto.    What do you do if you miss a dose? If you miss a dose, take it as soon as you remember on the same day then continue your regularly scheduled once daily regimen the next day. Do not take two doses of Xarelto on the same day.   Important Safety Information A possible side effect of Xarelto is bleeding. You should call your healthcare provider right away if you experience any of the following: ? Bleeding from an injury or your nose that does not stop. ? Unusual colored urine (red or dark brown) or unusual colored  stools (red or black). ? Unusual bruising for unknown reasons. ? A serious fall or if you hit your head (even if there is no bleeding).  Some medicines may interact with Xarelto and might increase your risk of bleeding while on Xarelto. To help avoid this, consult your healthcare provider or pharmacist prior to using any new prescription or non-prescription medications, including herbals, vitamins, non-steroidal anti-inflammatory drugs (NSAIDs) and supplements.  This website has more information on Xarelto: [email or web address]

## 2014-12-15 NOTE — Progress Notes (Signed)
Physical Therapy Treatment Patient Details Name: SHAVONTE ZHAO MRN: [id number] DOB: 1940/12/20 Today's Date: 12/15/2014    History of Present Illness R TKR - s/p Bil THR in 2006    PT Comments    Pt progressing, planning to go to rehab tomorrow  Follow Up Recommendations  SNF     Equipment Recommendations  None recommended by PT    Recommendations for Other Services       Precautions / Restrictions Precautions Precautions: Knee;Fall Required Braces or Orthoses: Knee Immobilizer - Right Knee Immobilizer - Right: Discontinue once straight leg raise with < 10 degree lag Restrictions Weight Bearing Restrictions: No Other Position/Activity Restrictions: WBAT    Mobility  Bed Mobility               General bed mobility comments:  (pt in chair)  Transfers Overall transfer level: Needs assistance Equipment used: Rolling walker (2 wheeled) Transfers: Sit to/from Stand Sit to Stand: Min assist         General transfer comment: cues for LE management and use of UEs to self assist  Ambulation/Gait Ambulation/Gait assistance: Min guard Ambulation Distance (Feet): 160 Feet Assistive device: Rolling walker (2 wheeled) Gait Pattern/deviations: Step-to pattern;Decreased step length - right;Decreased step length - left;Trunk flexed Gait velocity: decr   General Gait Details: cues for sequence, posture and position from [employer]            Wheelchair Mobility    Modified Rankin (Stroke Patients Only)       Balance                                    Cognition Arousal/Alertness: Awake/alert Behavior During Therapy: WFL for tasks assessed/performed Overall Cognitive Status: Within Functional Limits for tasks assessed                      Exercises Total Joint Exercises Ankle Circles/Pumps: AROM;15 reps;Both Quad Sets: AROM;Both;10 reps Heel Slides: AAROM;Right;10 reps Hip ABduction/ADduction: AAROM;AROM;Right;10  reps Straight Leg Raises: AAROM;Right;10 reps Goniometric ROM: -10 - 45*    General Comments        Pertinent Vitals/Pain Pain Assessment: 0-10 Pain Score: 2  Pain Location: R knee Pain Descriptors / Indicators: Sore Pain Intervention(s): Limited activity within patient's tolerance;Monitored during session;Ice applied    Home Living                      Prior Function            PT Goals (current goals can now be found in the care plan section) Acute Rehab PT Goals Patient Stated Goal: Walk without my knee buckling PT Goal Formulation: With patient Time For Goal Achievement: 12/20/14 Potential to Achieve Goals: Good Progress towards PT goals: Progressing toward goals    Frequency  7X/week    PT Plan Current plan remains appropriate    Co-evaluation             End of Session Equipment Utilized During Treatment: Gait belt;Right knee immobilizer Activity Tolerance: Patient tolerated treatment well Patient left: in chair;with call bell/phone within reach     Time: 1126-1154 PT Time Calculation (min) (ACUTE ONLY): 28 min  Charges:  $Gait Training: 8-22 mins $Therapeutic Exercise: 8-22 mins                    G Codes:  [facility] 12/15/2014, 12:49 PM

## 2014-12-15 NOTE — Plan of Care (Signed)
Problem: Phase II Progression Outcomes Goal: Discharge plan established Outcome: Completed/Met Date Met:  12/15/14 SNF for rehab

## 2014-12-16 DIAGNOSIS — R2681 Unsteadiness on feet: Secondary | ICD-10-CM | POA: Diagnosis not present

## 2014-12-16 DIAGNOSIS — K5900 Constipation, unspecified: Secondary | ICD-10-CM | POA: Diagnosis not present

## 2014-12-16 DIAGNOSIS — M6281 Muscle weakness (generalized): Secondary | ICD-10-CM | POA: Diagnosis not present

## 2014-12-16 DIAGNOSIS — K210 Gastro-esophageal reflux disease with esophagitis: Secondary | ICD-10-CM | POA: Diagnosis not present

## 2014-12-16 DIAGNOSIS — K5901 Slow transit constipation: Secondary | ICD-10-CM | POA: Diagnosis not present

## 2014-12-16 DIAGNOSIS — D62 Acute posthemorrhagic anemia: Secondary | ICD-10-CM | POA: Diagnosis not present

## 2014-12-16 DIAGNOSIS — Z96651 Presence of right artificial knee joint: Secondary | ICD-10-CM | POA: Diagnosis not present

## 2014-12-16 DIAGNOSIS — N3281 Overactive bladder: Secondary | ICD-10-CM | POA: Diagnosis not present

## 2014-12-16 DIAGNOSIS — E785 Hyperlipidemia, unspecified: Secondary | ICD-10-CM | POA: Diagnosis not present

## 2014-12-16 DIAGNOSIS — E038 Other specified hypothyroidism: Secondary | ICD-10-CM | POA: Diagnosis not present

## 2014-12-16 DIAGNOSIS — E039 Hypothyroidism, unspecified: Secondary | ICD-10-CM | POA: Diagnosis not present

## 2014-12-16 DIAGNOSIS — I10 Essential (primary) hypertension: Secondary | ICD-10-CM | POA: Diagnosis not present

## 2014-12-16 DIAGNOSIS — M1990 Unspecified osteoarthritis, unspecified site: Secondary | ICD-10-CM | POA: Diagnosis not present

## 2014-12-16 DIAGNOSIS — Z471 Aftercare following joint replacement surgery: Secondary | ICD-10-CM | POA: Diagnosis not present

## 2014-12-16 DIAGNOSIS — M1711 Unilateral primary osteoarthritis, right knee: Secondary | ICD-10-CM | POA: Diagnosis not present

## 2014-12-16 LAB — CBC
HCT: 28.2 % — ABNORMAL LOW (ref 36.0–46.0)
Hemoglobin: 9.4 g/dL — ABNORMAL LOW (ref 12.0–15.0)
MCH: 29.7 pg (ref 26.0–34.0)
MCHC: 33.3 g/dL (ref 30.0–36.0)
MCV: 89 fL (ref 78.0–100.0)
PLATELETS: 127 10*3/uL — AB (ref 150–400)
RBC: 3.17 MIL/uL — AB (ref 3.87–5.11)
RDW: 13.2 % (ref 11.5–15.5)
WBC: 7.9 10*3/uL (ref 4.0–10.5)

## 2014-12-16 MED ORDER — BISACODYL 10 MG RE SUPP: 10.0000 mg | Freq: Every day | RECTAL | Status: DC | PRN

## 2014-12-16 MED ORDER — RIVAROXABAN 10 MG PO TABS: 10.0000 mg | ORAL_TABLET | Freq: Every day | ORAL | Status: DC

## 2014-12-16 MED ORDER — ONDANSETRON HCL 4 MG PO TABS: 4.0000 mg | ORAL_TABLET | Freq: Four times a day (QID) | ORAL | Status: DC | PRN

## 2014-12-16 MED ORDER — DOCUSATE SODIUM 100 MG PO CAPS
100.0000 mg | ORAL_CAPSULE | Freq: Two times a day (BID) | ORAL | Status: DC
Start: 2014-12-16 — End: 2018-02-14

## 2014-12-16 MED ORDER — POLYETHYLENE GLYCOL 3350 17 G PO PACK: 17.0000 g | PACK | Freq: Every day | ORAL | Status: DC | PRN

## 2014-12-16 MED ORDER — METHOCARBAMOL 500 MG PO TABS: 500.0000 mg | ORAL_TABLET | Freq: Four times a day (QID) | ORAL | Status: DC | PRN

## 2014-12-16 MED ORDER — TRAMADOL HCL 50 MG PO TABS: 50.0000 mg | ORAL_TABLET | Freq: Four times a day (QID) | ORAL | Status: DC | PRN

## 2014-12-16 MED ORDER — METOCLOPRAMIDE HCL 5 MG PO TABS: 5.0000 mg | ORAL_TABLET | Freq: Three times a day (TID) | ORAL | Status: DC | PRN

## 2014-12-16 MED ORDER — OXYCODONE HCL 5 MG PO TABS: 5.0000 mg | ORAL_TABLET | ORAL | Status: DC | PRN

## 2014-12-16 MED ORDER — ACETAMINOPHEN 325 MG PO TABS: 650.0000 mg | ORAL_TABLET | Freq: Four times a day (QID) | ORAL | Status: DC | PRN

## 2014-12-16 NOTE — Progress Notes (Signed)
   Subjective: 3 Days Post-Op Procedure(s) (LRB): RIGHT TOTAL KNEE ARTHROPLASTY (Right) Patient reports pain as mild.   Patient seen in rounds for Dr. Wynelle Link.  Moving bowels and voiding well. Patient is well, and has had no acute complaints or problems Patient is ready to go to [facility] today.  Objective: Vital signs in last 24 hours: Temp:  [97.7 F (36.5 C)-99.9 F (37.7 C)] 99.5 F (37.5 C) (04/11 0428) Pulse Rate:  [71-88] 88 (04/11 0428) Resp:  [18-20] 20 (04/11 0428) BP: (110-133)/(45-50) 130/50 mmHg (04/11 0428) SpO2:  [96 %-100 %] 98 % (04/11 0428)  Intake/Output from previous day:  Intake/Output Summary (Last 24 hours) at 12/16/14 0704 Last data filed at 12/15/14 2220  Gross per 24 hour  Intake   1200 ml  Output    750 ml  Net    450 ml    Labs:  Recent Labs  12/14/14 0435 12/15/14 0515 12/16/14 0447  HGB 11.2* 10.2* 9.4*    Recent Labs  12/15/14 0515 12/16/14 0447  WBC 9.3 7.9  RBC 3.38* 3.17*  HCT 29.9* 28.2*  PLT 141* 127*    Recent Labs  12/14/14 0435 12/15/14 0515  NA 135 137  K 3.8 4.1  CL 104 106  CO2 24 26  BUN 13 14  CREATININE 0.59 0.53  GLUCOSE 117* 107*  CALCIUM 8.5 8.4   No results for input(s): LABPT, INR in the last 72 hours.  EXAM: General - Patient is Alert, Appropriate and Oriented Extremity - Neurovascular intact Sensation intact distally Dorsiflexion/Plantar flexion intact Incision - clean, dry, no drainage Motor Function - intact, moving foot and toes well on exam.   Assessment/Plan: 3 Days Post-Op Procedure(s) (LRB): RIGHT TOTAL KNEE ARTHROPLASTY (Right) Procedure(s) (LRB): RIGHT TOTAL KNEE ARTHROPLASTY (Right) Past Medical History  Diagnosis Date  . Hypertension   . Hypothyroidism   . Arthritis   . Hyperlipemia   . Wears glasses   . Complication of anesthesia     woke up too early from a surgery  . Bladder spasms     "spastic bladder" wears pads  . GERD (gastroesophageal reflux disease)    occ. frequent "hiccoughs" after "eating, indigestion"  . Trigger finger, left     alternate fingers affected. -Occ. shooting pain.  . Cancer     Skin cancer -scalp and eyebrow"basal cell"   Principal Problem:   OA (osteoarthritis) of knee  Estimated body mass index is 34.19 kg/(m^2) as calculated from the following:   Height as of this encounter: 5\' 2"  (1.575 m).   Weight as of this encounter: 84.823 kg (187 lb). Up with therapy Discharge to SNF Diet - Cardiac diet Follow up - in 2 weeks Activity - WBAT Disposition - Skilled nursing facility Condition Upon Discharge - Good D/C Meds - See DC Summary DVT Prophylaxis - Xarelto  Arlee Muslim, PA-C Orthopaedic Surgery 12/16/2014, 7:04 AM

## 2014-12-16 NOTE — Progress Notes (Signed)
Clinical Social Work Department CLINICAL SOCIAL WORK PLACEMENT NOTE 12/16/2014  Patient:  [name], [name]  Account Number:  [id number] [place] date:  12/13/2014  Clinical Social Worker:  Werner Lean, LCSW  Date/time:  12/13/2014 03:32 PM  Clinical Social Work is seeking post-discharge placement for this patient at the following level of care:   SKILLED NURSING   (*CSW will update this form in Epic as items are completed)     Patient/family provided with [place] Department of Clinical Social Work's list of facilities offering this level of care within the geographic area requested by the patient (or if unable, by the patient's family).  12/13/2014  Patient/family informed of their freedom to choose among providers that offer the needed level of care, that participate in Medicare, Medicaid or managed care program needed by the patient, have an available bed and are willing to accept the patient.  12/13/2014  Patient/family informed of [facility]' ownership interest in [facility], as well as of the fact that they are under no obligation to receive care at this facility.  PASARR submitted to EDS on 12/13/2014 PASARR number received on 12/13/2014  FL2 transmitted to all facilities in geographic area requested by pt/family on  12/13/2014 FL2 transmitted to all facilities within larger geographic area on   Patient informed that his/her managed care company has contracts with or will negotiate with  certain facilities, including the following:     Patient/family informed of bed offers received:  12/13/2014 Patient chooses bed at [place] Physician recommends and patient chooses bed at    Patient to be transferred to [place] on  12/16/2014 Patient to be transferred to facility by [place] Patient and family notified of transfer on 12/16/2014 Name of family member notified:  Pt contacted friend directly.  The following physician request were entered in  Epic:   Additional Comments: Pt is in agreement with d/c to SNF today. PT has approved transport by car. NSG has reviewed d/c summary, avs, scripts. Scripts included in d/c packet. Packet provided to pt prior to d/c.  Werner Lean LCSW [phone]

## 2014-12-16 NOTE — Progress Notes (Signed)
Physical Therapy Treatment Patient Details Name: [name] MRN: [id number] DOB: 1940/11/04 Today's Date: 2014/12/18    History of Present Illness R TKR - s/p Bil THR in 2006    PT Comments    Pt progressing, will benefit from SNF level therapies;  Follow Up Recommendations  SNF     Equipment Recommendations  None recommended by PT    Recommendations for Other Services       Precautions / Restrictions Precautions Precautions: Knee;Fall Required Braces or Orthoses: Knee Immobilizer - Right Knee Immobilizer - Right: Discontinue once straight leg raise with < 10 degree lag Restrictions Weight Bearing Restrictions: No Other Position/Activity Restrictions: WBAT    Mobility  Bed Mobility                  Transfers Overall transfer level: Needs assistance Equipment used: Rolling walker (2 wheeled) Transfers: Sit to/from Stand Sit to Stand: Min assist;Min guard         General transfer comment: cues for LE management and use of UEs to self assist  Ambulation/Gait Ambulation/Gait assistance: Min guard;Supervision Ambulation Distance (Feet): 60 Feet Assistive device: Rolling walker (2 wheeled) Gait Pattern/deviations: Step-to pattern;Antalgic;Trunk flexed Gait velocity: decr   General Gait Details: cues for sequence, posture and position from RW; pt beginning to self correct today   Stairs            Wheelchair Mobility    Modified Rankin (Stroke Patients Only)       Balance                                    Cognition Arousal/Alertness: Awake/alert Behavior During Therapy: WFL for tasks assessed/performed Overall Cognitive Status: Within Functional Limits for tasks assessed                      Exercises Total Joint Exercises Ankle Circles/Pumps: AROM;15 reps;Both Quad Sets: AROM;Both;10 reps Heel Slides: AAROM;Right;10 reps Hip ABduction/ADduction: AAROM;AROM;Right;10 reps Straight Leg Raises:  AAROM;Right;10 reps Goniometric ROM: ~-5 to 60* AAROM    General Comments        Pertinent Vitals/Pain Pain Assessment: 0-10 Pain Score: 2  Pain Location: R knee Pain Descriptors / Indicators: Aching Pain Intervention(s): Limited activity within patient's tolerance;Monitored during session;Ice applied    Home Living                      Prior Function            PT Goals (current goals can now be found in the care plan section) Acute Rehab PT Goals Patient Stated Goal: Walk without my knee buckling PT Goal Formulation: With patient Time For Goal Achievement: 12/20/14 Potential to Achieve Goals: Good Progress towards PT goals: Progressing toward goals    Frequency  7X/week    PT Plan Current plan remains appropriate    Co-evaluation             End of Session Equipment Utilized During Treatment: Gait belt;Right knee immobilizer Activity Tolerance: Patient tolerated treatment well Patient left: in chair;with call bell/phone within reach     Time: 1153-1233 PT Time Calculation (min) (ACUTE ONLY): 40 min  Charges:  $Gait Training: 8-22 mins $Therapeutic Exercise: 8-22 mins                    G Codes:      [facility] 12/18/14, 12:37  PM   

## 2014-12-17 ENCOUNTER — Non-Acute Institutional Stay (SKILLED_NURSING_FACILITY): Payer: Medicare Other | Admitting: Adult Health

## 2014-12-17 ENCOUNTER — Encounter: Payer: Self-pay | Admitting: Adult Health

## 2014-12-17 DIAGNOSIS — E785 Hyperlipidemia, unspecified: Secondary | ICD-10-CM | POA: Diagnosis not present

## 2014-12-17 DIAGNOSIS — K5901 Slow transit constipation: Secondary | ICD-10-CM | POA: Diagnosis not present

## 2014-12-17 DIAGNOSIS — I10 Essential (primary) hypertension: Secondary | ICD-10-CM | POA: Diagnosis not present

## 2014-12-17 DIAGNOSIS — M1711 Unilateral primary osteoarthritis, right knee: Secondary | ICD-10-CM

## 2014-12-17 DIAGNOSIS — E039 Hypothyroidism, unspecified: Secondary | ICD-10-CM

## 2014-12-17 DIAGNOSIS — N3281 Overactive bladder: Secondary | ICD-10-CM | POA: Diagnosis not present

## 2014-12-17 NOTE — Progress Notes (Signed)
Patient ID: [name], female   DOB: February 19, 1941, 74 y.o.   MRN: [id number]   12/17/2014  Facility:  Nursing Home Location:  [place] Room Number: 705-P LEVEL OF CARE:  SNF (31)   Chief Complaint  Patient presents with  . Hospitalization Follow-up    Osteoarthritis S/P right total knee arthroplasty, overactive bladder, hypertension, hypothyroidism, constipation and hyperlipidemia    HISTORY OF PRESENT ILLNESS:  This is a 74 year old female who has been admitted to [facility] on 12/16/14 from [facility] with osteoarthritis S/P right total knee arthroplasty. She has PMH HYPERTENSION, hypothyroidism, hyperlipidemia and GERD, skin cancer.  She has been admitted for a short-term rehabilitation.  PAST MEDICAL HISTORY:  Past Medical History  Diagnosis Date  . Hypertension   . Hypothyroidism   . Arthritis   . Hyperlipemia   . Wears glasses   . Complication of anesthesia     woke up too early from a surgery  . Bladder spasms     "spastic bladder" wears pads  . GERD (gastroesophageal reflux disease)     occ. frequent "hiccoughs" after "eating, indigestion"  . Trigger finger, left     alternate fingers affected. -Occ. shooting pain.  . Cancer     Skin cancer -scalp and eyebrow"basal cell"    CURRENT MEDICATIONS: Reviewed per MAR/see medication list  Allergies  Allergen Reactions  . Feldene [Piroxicam] Other (See Comments)    CANT REMEMBER  . Ivp Dye [Iodinated Diagnostic Agents] Other (See Comments)    CANT REMEMBER  . Naproxen Nausea And Vomiting  . Sulfa Antibiotics     CANT REMEMBER  . Vicodin [Hydrocodone-Acetaminophen] Nausea And Vomiting     REVIEW OF SYSTEMS:  GENERAL: no change in appetite, no fatigue, no weight changes, no fever, chills or weakness RESPIRATORY: no cough, SOB, DOE, wheezing, hemoptysis CARDIAC: no chest pain,  or palpitations, +edema GI: no abdominal pain, diarrhea, heart burn, nausea or vomiting,  + constipation  PHYSICAL EXAMINATION  GENERAL: no acute distress, obese SKIN:  Right knee surgical site is dry, no redness EYES: conjunctivae normal, sclerae normal, normal eye lids NECK: supple, trachea midline, no neck masses, no thyroid tenderness, no thyromegaly LYMPHATICS: no LAN in the neck, no supraclavicular LAN RESPIRATORY: breathing is even & unlabored, BS CTAB CARDIAC: RRR, no murmur,no extra heart sounds, RLE edema 2+ GI: abdomen soft, normal BS, no masses, no tenderness, no hepatomegaly, no splenomegaly EXTREMITIES:  Able to move 4 extremities PSYCHIATRIC: the patient is alert & oriented to person, affect & behavior appropriate  LABS/RADIOLOGY: Labs reviewed: Basic Metabolic Panel:  Recent Labs  12/06/14 1115 12/14/14 0435 12/15/14 0515  NA 141 135 137  K 4.8 3.8 4.1  CL 106 104 106  CO2 27 24 26   GLUCOSE 77 117* 107*  BUN 22 13 14   CREATININE 0.64 0.59 0.53  CALCIUM 9.7 8.5 8.4   Liver Function Tests:  Recent Labs  12/06/14 1115  AST 21  ALT 20  ALKPHOS 81  BILITOT 0.6  PROT 6.7  ALBUMIN 3.9   CBC:  Recent Labs  12/14/14 0435 12/15/14 0515 12/16/14 0447  WBC 8.8 9.3 7.9  HGB 11.2* 10.2* 9.4*  HCT 33.4* 29.9* 28.2*  MCV 88.8 88.5 89.0  PLT 146* 141* 127*     Dg Bone Density  11/19/2014   EXAM: DUAL X-RAY ABSORPTIOMETRY (DXA) FOR BONE MINERAL DENSITY  IMPRESSION: Ordering Physician:  Dr. Caren Macadam,  Your patient [name] completed  a BMD test on 11/19/2014 using the [place] (software version: 14.10) manufactured by [employer]. The following summarizes the results of our evaluation. PATIENT BIOGRAPHICAL: Name: [name], [name] Patient ID: [id number] Birth Date: Feb 16, 1941 Height: 62.0 in. Gender: Female Exam Date: 11/19/2014 Weight: 188.0 lbs. Indications: Advanced Age, Bilateral Oophrectomy, Caucasian, Follow up Osteopenia, Height Loss, Post Menopausal Fractures: Finger Treatments: Calcium, Vitamin D  DENSITOMETRY RESULTS: Site         Region     Measured Date Measured Age WHO Classification Young Adult T-score BMD         %Change vs. Previous Significant Change (*) AP Spine L1-L4 (L2) 11/19/2014 72.4 Normal -0.4 1.119 g/cm2 13.5% Yes AP Spine L1-L4 (L2) 06/23/2010 68.0 Osteopenia -1.5 0.986 g/cm2 0.5% - AP Spine L1-L4 (L2) 02/17/2004 61.7 Osteopenia -1.6 0.981 g/cm2 - -  Left Forearm Radius 33% 11/19/2014 72.4 Normal -1.0 0.641 g/cm2 -9.2% Yes Left Forearm Radius 33% 06/23/2010 68.0 Normal -0.1 0.706 g/cm2 - - ASSESSMENT: BMD as determined from Forearm Radius 33% is 0.641 g/cm2 with a T-Score of -1.0. This patient is considered normal according to [facility]) criteria. Compared with the prior study on 06/23/2010, the BMD of the lumbar spine shows a statistically significant increase. Compared with the prior study on 06/23/2010, the BMD of the left forearm shows a statistically significant decrease. (L-2 was excluded due to advanced degenerative changes.) (Patient does not meet criteria for FRAX assessment.)  World Health Organization [facility]) criteria for post-menopausal, Caucasian Women: Normal:       T-score at or above -1 SD Osteopenia:   T-score between -1 and -2.5 SD Osteoporosis: T-score at or below -2.5 SD  RECOMMENDATIONS: [place] recommends that FDA-approved medial therapies be considered in postmenopausal women and men age 85 or older with a: 1. Hip or vertberal (clinical or morphometric) fracture. 2. T-Score of < -2.5 at the spine or hip. 3. Ten-year fracture probability by FRAX of 3% or greater for hip fracture or 20% or greater for major osteoporotic fracture.  All treatment decisions require clinical judgment and consideration of indiviual patient factors, including patient preferences, co-morbidities, previous drug use, risk factors not captured in the FRAX model (e.g. falls, vitamin D deficiency, increased bone turnover, interval significant decline in  bone density) and possible under-or over-estimation of fracture risk by FRAX.  All patients should ensure an adequate intake of dietary calcium (1200 mg/d) and vitamin D (800 IU daily) unless contraindicated.  FOLLOW-UP: People with diagnosed cases of osteoporosis or osteopenia should be regularly tested for bone mineral density. For patients eligible for Medicare, routine testing is allowed once every 2 years. Testing frequency can be increased for patients who have rapidly progressing disease, or for those who are receiving medical therapy to restore bone mass. I have reviewed this report, and agree with the above findings.  [facility]   Electronically Signed   By: David  Martinique   On: 11/19/2014 11:51    ASSESSMENT/PLAN:  Osteoarthritis S/P right total knee arthroplasty - for rehabilitation; follow-up with Dr. Wynelle Link 14/19/16; continue Xarelto 10 mg by mouth daily 18 days then aspirin 325 mg by mouth daily; Tylenol 650 mg by mouth every 6 hours when necessary, Ultram 50 mg 1-2 tabs by mouth every 6 hours when necessary and oxycodone 5 mg 1-2 tabs by mouth every 3 hours when necessary for pain; and Robaxin 500 mg 1 tab by mouth every 6 hours when necessary for muscle spasm Overactive  bladder - continue Toviaz 8 mg by mouth daily Hypothyroidism - continue Synthroid 125 g by mouth daily Hypertension - well-controlled; continue Lopressor 50 mg by mouth twice a day Constipation - discontinue Colace, start senna S2 tabs by mouth twice a day and Dulcolax suppository 1 per rectum daily when necessary Hyperlipidemia - continue Zocor 10 mg by mouth daily at bedtime   Goals of care:  Short-term rehabilitation   Labs/test ordered:  none   Spent 50 minutes in patient care.   [facility], NP [employer] [phone]

## 2014-12-18 ENCOUNTER — Non-Acute Institutional Stay (SKILLED_NURSING_FACILITY): Payer: Medicare Other | Admitting: Internal Medicine

## 2014-12-18 DIAGNOSIS — E785 Hyperlipidemia, unspecified: Secondary | ICD-10-CM

## 2014-12-18 DIAGNOSIS — K5900 Constipation, unspecified: Secondary | ICD-10-CM | POA: Diagnosis not present

## 2014-12-18 DIAGNOSIS — N3281 Overactive bladder: Secondary | ICD-10-CM

## 2014-12-18 DIAGNOSIS — M1711 Unilateral primary osteoarthritis, right knee: Secondary | ICD-10-CM

## 2014-12-18 DIAGNOSIS — E039 Hypothyroidism, unspecified: Secondary | ICD-10-CM

## 2014-12-18 DIAGNOSIS — D62 Acute posthemorrhagic anemia: Secondary | ICD-10-CM | POA: Diagnosis not present

## 2014-12-18 DIAGNOSIS — I10 Essential (primary) hypertension: Secondary | ICD-10-CM

## 2014-12-18 NOTE — Progress Notes (Signed)
Patient ID: [name], female   DOB: [birth year], 74 y.o.   MRN: [id number]     [facility]   PCP: Purvis Kilts, MD  Code Status: full code  Allergies  Allergen Reactions  . Feldene [Piroxicam] Other (See Comments)    CANT REMEMBER  . Ivp Dye [Iodinated Diagnostic Agents] Other (See Comments)    CANT REMEMBER  . Naproxen Nausea And Vomiting  . Sulfa Antibiotics     CANT REMEMBER  . Vicodin [Hydrocodone-Acetaminophen] Nausea And Vomiting    Chief Complaint  Patient presents with  . New Admit To SNF     HPI:  74 year old patient is here for short term rehabilitation post hospital admission from 12/13/14-12/16/14 with right knee OA. She underwent right total knee arthroplasty. Her pain is currently under control. She denies any concerns this visit.  Review of Systems:  Constitutional: Negative for fever, malaise/fatigue and diaphoresis.  HENT: Negative for headache, congestion, nasal discharge Eyes: Negative for eye pain, blurred vision, discharge.  Respiratory: Negative for cough, shortness of breath and wheezing.   Cardiovascular: Negative for chest pain, palpitations. Positive for leg swelling.  Gastrointestinal: Negative for heartburn, nausea, vomiting, abdominal pain. Had a bowel movement this am Genitourinary: Negative for dysuria, flank pain.  Musculoskeletal: Negative for back pain, falls Skin: Negative for itching, rash.  Neurological: Negative for dizziness, tingling, focal weakness Psychiatric/Behavioral: Negative for depression   Past Medical History  Diagnosis Date  . Hypertension   . Hypothyroidism   . Arthritis   . Hyperlipemia   . Wears glasses   . Complication of anesthesia     woke up too early from a surgery  . Bladder spasms     "spastic bladder" wears pads  . GERD (gastroesophageal reflux disease)     occ. frequent "hiccoughs" after "eating, indigestion"  . Trigger finger, left     alternate fingers  affected. -Occ. shooting pain.  . Cancer     Skin cancer -scalp and eyebrow"basal cell"   Past Surgical History  Procedure Laterality Date  . Total hip arthroplasty  2006    right  . Total hip arthroplasty  2006    left  . Tonsillectomy    . Appendectomy    . Carpal tunnel release Left   . Thyroidectomy  1994  . Cardiac catheterization  2006    normal-done for work up pre hip surgeries  . Cholecystectomy  1997  . Abdominal hysterectomy  1989  . Polypectomy Bilateral 06/26/2013    Procedure: BILATERAL EXCISION OF NASAL POLYPS ;  Surgeon: Ascencion Dike, MD;  Location: [place];  Service: ENT;  Laterality: Bilateral;  . Septoplasty N/A 06/26/2013    Procedure: AND SEPTOPLASTY;  Surgeon: Ascencion Dike, MD;  Location: [place];  Service: ENT;  Laterality: N/A;  . Joint replacement      BTHA  . Total knee arthroplasty Right 12/13/2014    Procedure: RIGHT TOTAL KNEE ARTHROPLASTY;  Surgeon: Gaynelle Arabian, MD;  Location: [facility];  Service: Orthopedics;  Laterality: Right;   Social History:   reports that she has never smoked. She does not have any smokeless tobacco history on file. She reports that she does not drink alcohol or use illicit drugs.  No family history on file.  Medications: Patient's Medications  New Prescriptions   No medications on file  Previous Medications   ACETAMINOPHEN (TYLENOL) 325 MG TABLET    Take 2 tablets (650  mg total) by mouth every 6 (six) hours as needed for mild pain (or Fever >/= 101).   BISACODYL (DULCOLAX) 10 MG SUPPOSITORY    Place 1 suppository (10 mg total) rectally daily as needed for moderate constipation.   DOCUSATE SODIUM (COLACE) 100 MG CAPSULE    Take 1 capsule (100 mg total) by mouth 2 (two) times daily.   FESOTERODINE (TOVIAZ) 8 MG TB24 TABLET    Take 8 mg by mouth daily. Takes at bedtime   LEVOTHYROXINE (SYNTHROID, LEVOTHROID) 125 MCG TABLET    Take 125 mcg by mouth daily before breakfast.   METHOCARBAMOL  (ROBAXIN) 500 MG TABLET    Take 1 tablet (500 mg total) by mouth every 6 (six) hours as needed for muscle spasms.   METOCLOPRAMIDE (REGLAN) 5 MG TABLET    Take 1 tablet (5 mg total) by mouth every 8 (eight) hours as needed for nausea (if ondansetron (ZOFRAN) ineffective.).   METOPROLOL (LOPRESSOR) 50 MG TABLET    Take 50 mg by mouth 2 (two) times daily.   MULTIPLE VITAMINS-MINERALS (PRESERVISION AREDS 2 PO)    Take 1 tablet by mouth 2 (two) times daily.   ONDANSETRON (ZOFRAN) 4 MG TABLET    Take 1 tablet (4 mg total) by mouth every 6 (six) hours as needed for nausea.   OXYCODONE (OXY IR/ROXICODONE) 5 MG IMMEDIATE RELEASE TABLET    Take 1-2 tablets (5-10 mg total) by mouth every 3 (three) hours as needed for moderate pain, severe pain or breakthrough pain.   POLYETHYLENE GLYCOL (MIRALAX / GLYCOLAX) PACKET    Take 17 g by mouth daily as needed for mild constipation.   RIVAROXABAN (XARELTO) 10 MG TABS TABLET    Take 1 tablet (10 mg total) by mouth daily with breakfast. Take Xarelto for two and a half more weeks, then discontinue Xarelto. Once the patient has completed the Xarelto, they may resume the 325 mg Aspirin.   SIMVASTATIN (ZOCOR) 10 MG TABLET    Take 10 mg by mouth at bedtime.   TRAMADOL (ULTRAM) 50 MG TABLET    Take 1-2 tablets (50-100 mg total) by mouth every 6 (six) hours as needed (mild pain).  Modified Medications   No medications on file  Discontinued Medications   No medications on file     Physical Exam: Filed Vitals:   12/18/14 1237  BP: 132/60  Pulse: 78  Temp: 99.1 F (37.3 C)  Resp: 18  SpO2: 97%    General- elderly female, obese, in no acute distress Head- normocephalic, atraumatic Throat- moist mucus membrane Eyes- PERRLA, EOMI, no pallor, no icterus, no discharge, normal conjunctiva, normal sclera Neck- no cervical lymphadenopathy Cardiovascular- normal s1,s2, no murmurs, palpable dorsalis pedis, right leg trace edema, no left leg edema Respiratory- bilateral  clear to auscultation, no wheeze, no rhonchi, no crackles, no use of accessory muscles Abdomen- bowel sounds present, soft, non tender Musculoskeletal- able to move all 4 extremities, limited range of motion of right knee  Neurological- no focal deficit Skin- warm and dry, dry dressing on right knee with steri strips in place, right drain site having serous drainage, no signs of infection Psychiatry- alert and oriented to person, place and time, normal mood and affect    Labs reviewed: Basic Metabolic Panel:  Recent Labs  12/06/14 1115 12/14/14 0435 12/15/14 0515  NA 141 135 137  K 4.8 3.8 4.1  CL 106 104 106  CO2 27 24 26   GLUCOSE 77 117* 107*  BUN 22 13 14  CREATININE 0.64 0.59 0.53  CALCIUM 9.7 8.5 8.4   Liver Function Tests:  Recent Labs  12/06/14 1115  AST 21  ALT 20  ALKPHOS 81  BILITOT 0.6  PROT 6.7  ALBUMIN 3.9   No results for input(s): LIPASE, AMYLASE in the last 8760 hours. No results for input(s): AMMONIA in the last 8760 hours. CBC:  Recent Labs  12/14/14 0435 12/15/14 0515 12/16/14 0447  WBC 8.8 9.3 7.9  HGB 11.2* 10.2* 9.4*  HCT 33.4* 29.9* 28.2*  MCV 88.8 88.5 89.0  PLT 146* 141* 127*    Assessment/Plan  Right knee Osteoarthritis  S/P right total knee arthroplasty. Will have her work with physical therapy and occupational therapy team to help with gait training and muscle strengthening exercises.fall precautions. Skin care. Encourage to be out of bed. Has follow-up with Dr. Wynelle Link. Continue Xarelto 10 mg daily for now. Continue tylenol 650 mg q6 hours prn, Ultram 50 mg 1-2 tabs q6h prn and oxycodone 5 mg 1-2 tabs q3h prn for pain. Continue Robaxin 500 mg q6h prn muscle spasm  Constipation senna S 2 tabs bid has been helpful, continue this andprn Dulcolax suppository  Blood loss anemia likley post op, check h&h  Overactive bladder continue Toviaz 8 mg daily  Hypothyroidism continue Synthroid 125 mcg daily  Hyperlipidemia Continue  zocor 10 mg daily  Hypertension continue Lopressor 50 mg bid, monitor bp   Goals of care: short term rehabilitation   Labs/tests ordered: cbc   Family/ staff Communication: reviewed care plan with patient and nursing supervisor    Blanchie Serve, MD  [facility] [phone] (Monday-Friday 8 am - 5 pm) [phone] (afterhours)

## 2014-12-20 ENCOUNTER — Non-Acute Institutional Stay (SKILLED_NURSING_FACILITY): Payer: Medicare Other | Admitting: Adult Health

## 2014-12-20 DIAGNOSIS — M1711 Unilateral primary osteoarthritis, right knee: Secondary | ICD-10-CM | POA: Diagnosis not present

## 2014-12-20 DIAGNOSIS — I10 Essential (primary) hypertension: Secondary | ICD-10-CM

## 2014-12-20 DIAGNOSIS — E785 Hyperlipidemia, unspecified: Secondary | ICD-10-CM | POA: Diagnosis not present

## 2014-12-20 DIAGNOSIS — N3281 Overactive bladder: Secondary | ICD-10-CM

## 2014-12-20 DIAGNOSIS — K5901 Slow transit constipation: Secondary | ICD-10-CM | POA: Diagnosis not present

## 2014-12-20 DIAGNOSIS — E039 Hypothyroidism, unspecified: Secondary | ICD-10-CM

## 2014-12-20 DIAGNOSIS — D62 Acute posthemorrhagic anemia: Secondary | ICD-10-CM | POA: Diagnosis not present

## 2014-12-23 ENCOUNTER — Ambulatory Visit (HOSPITAL_COMMUNITY): Payer: Medicare Other | Attending: Orthopedic Surgery | Admitting: Physical Therapy

## 2014-12-23 ENCOUNTER — Encounter (HOSPITAL_COMMUNITY): Payer: Self-pay | Admitting: Physical Therapy

## 2014-12-23 ENCOUNTER — Encounter: Payer: Self-pay | Admitting: Adult Health

## 2014-12-23 DIAGNOSIS — Z471 Aftercare following joint replacement surgery: Secondary | ICD-10-CM | POA: Diagnosis not present

## 2014-12-23 DIAGNOSIS — M6281 Muscle weakness (generalized): Secondary | ICD-10-CM | POA: Insufficient documentation

## 2014-12-23 DIAGNOSIS — M25661 Stiffness of right knee, not elsewhere classified: Secondary | ICD-10-CM | POA: Insufficient documentation

## 2014-12-23 DIAGNOSIS — M25561 Pain in right knee: Secondary | ICD-10-CM | POA: Insufficient documentation

## 2014-12-23 DIAGNOSIS — M24661 Ankylosis, right knee: Secondary | ICD-10-CM

## 2014-12-23 DIAGNOSIS — R531 Weakness: Secondary | ICD-10-CM

## 2014-12-23 DIAGNOSIS — M25461 Effusion, right knee: Secondary | ICD-10-CM | POA: Insufficient documentation

## 2014-12-23 DIAGNOSIS — R609 Edema, unspecified: Secondary | ICD-10-CM

## 2014-12-23 DIAGNOSIS — Z96651 Presence of right artificial knee joint: Secondary | ICD-10-CM | POA: Diagnosis not present

## 2014-12-23 NOTE — Progress Notes (Signed)
Patient ID: [name], female   DOB: Jun 17, 1941, 74 y.o.   MRN: [id number]   12/20/14  Facility:  Nursing Home Location:  [place] Room Number: 705-P LEVEL OF CARE:  SNF (31)   Chief Complaint  Patient presents with  . Discharge Note    Osteoarthritis S/P right total knee arthroplasty, overactive bladder, hypertension, hypothyroidism, constipation and hyperlipidemia    HISTORY OF PRESENT ILLNESS:  This is a 74 year old female who is for discharge home and will have outpatient rehabilitation. DME:  Pediatric rolling walker and bedside commode. She has been admitted to [facility] on 12/16/14 from [facility] with osteoarthritis S/P right total knee arthroplasty. She has PMH HYPERTENSION, hypothyroidism, hyperlipidemia and GERD, skin cancer.  She has been admitted to [facility] for a short-term rehabilitation and will now have outpatient rehabilitation upon discharge.  PAST MEDICAL HISTORY:  Past Medical History  Diagnosis Date  . Hypertension   . Hypothyroidism   . Arthritis   . Hyperlipemia   . Wears glasses   . Complication of anesthesia     woke up too early from a surgery  . Bladder spasms     "spastic bladder" wears pads  . GERD (gastroesophageal reflux disease)     occ. frequent "hiccoughs" after "eating, indigestion"  . Trigger finger, left     alternate fingers affected. -Occ. shooting pain.  . Cancer     Skin cancer -scalp and eyebrow"basal cell"    CURRENT MEDICATIONS: Reviewed per MAR/see medication list  Allergies  Allergen Reactions  . Feldene [Piroxicam] Other (See Comments)    CANT REMEMBER  . Ivp Dye [Iodinated Diagnostic Agents] Other (See Comments)    CANT REMEMBER  . Naproxen Nausea And Vomiting  . Sulfa Antibiotics     CANT REMEMBER  . Vicodin [Hydrocodone-Acetaminophen] Nausea And Vomiting     REVIEW OF SYSTEMS:  GENERAL: no change in appetite, no fatigue, no weight changes, no fever, chills or  weakness RESPIRATORY: no cough, SOB, DOE, wheezing, hemoptysis CARDIAC: no chest pain,  or palpitations, +edema GI: no abdominal pain, diarrhea, heart burn, nausea or vomiting  PHYSICAL EXAMINATION  GENERAL: no acute distress, obese SKIN:  Right knee surgical site is dry, with steri-trips, erythematous NECK: supple, trachea midline, no neck masses, no thyroid tenderness, no thyromegaly LYMPHATICS: no LAN in the neck, no supraclavicular LAN RESPIRATORY: breathing is even & unlabored, BS CTAB CARDIAC: RRR, no murmur,no extra heart sounds, RLE edema 2+ GI: abdomen soft, normal BS, no masses, no tenderness, no hepatomegaly, no splenomegaly EXTREMITIES:  Able to move 4 extremities PSYCHIATRIC: the patient is alert & oriented to person, affect & behavior appropriate  LABS/RADIOLOGY: Labs reviewed: Basic Metabolic Panel:  Recent Labs  12/06/14 1115 12/14/14 0435 12/15/14 0515  NA 141 135 137  K 4.8 3.8 4.1  CL 106 104 106  CO2 27 24 26   GLUCOSE 77 117* 107*  BUN 22 13 14   CREATININE 0.64 0.59 0.53  CALCIUM 9.7 8.5 8.4   Liver Function Tests:  Recent Labs  12/06/14 1115  AST 21  ALT 20  ALKPHOS 81  BILITOT 0.6  PROT 6.7  ALBUMIN 3.9   CBC:  Recent Labs  12/14/14 0435 12/15/14 0515 12/16/14 0447  WBC 8.8 9.3 7.9  HGB 11.2* 10.2* 9.4*  HCT 33.4* 29.9* 28.2*  MCV 88.8 88.5 89.0  PLT 146* 141* 127*     ASSESSMENT/PLAN:  Osteoarthritis S/P right total knee arthroplasty - for outpatient  rehabilitation; follow-up with Dr. Wynelle Link 14/19/16; continue Xarelto 10 mg by mouth daily  12 days then aspirin 325 mg by mouth daily; Tylenol 650 mg by mouth every 6 hours when necessary, Ultram 50 mg 1-2 tabs by mouth every 6 hours when necessary and oxycodone 5 mg 1-2 tabs by mouth every 3 hours when necessary for pain; and Robaxin 500 mg 1 tab by mouth every 6 hours when necessary for muscle spasm Overactive bladder - continue Toviaz 8 mg by mouth daily Hypothyroidism -  continue Synthroid 125 g by mouth daily Hypertension - well-controlled; continue Lopressor 50 mg by mouth twice a day Constipation - continue Colace, start senna S2 tabs by mouth twice a day and Dulcolax suppository 1 per rectum daily when necessary Hyperlipidemia - continue Zocor 10 mg by mouth daily at bedtime    I have filled out patient's discharge paperwork and written prescriptions.  Patient will have outpatient rehabilitation.  DME provided:  Pediatric rolling walker and bedside commode  Total discharge time: Greater than 30 minutes  Discharge time involved coordination of the discharge process with social worker, nursing staff and therapy department.    [facility], NP [employer] [phone]

## 2014-12-23 NOTE — Therapy (Signed)
[place], [place], [address] Phone: [phone]   Fax:  [phone]  Physical Therapy Evaluation  Patient Details  Name: [name] MRN: [id number] Date of Birth: 1940/12/31 Referring Provider:  Gaynelle Arabian, MD  Encounter Date: 12/23/2014      PT End of Session - 12/23/14 1500    Visit Number 1   Number of Visits 12   Date for PT Re-Evaluation 01/20/15   Authorization Type Medicare    Authorization Time Period 12/23/14 to 02/22/15   Authorization - Visit Number 1   Authorization - Number of Visits 10   PT Start Time 1346   PT Stop Time 1430   PT Time Calculation (min) 44 min   Activity Tolerance Patient tolerated treatment well   Behavior During Therapy [facility] for tasks assessed/performed      Past Medical History  Diagnosis Date  . Hypertension   . Hypothyroidism   . Arthritis   . Hyperlipemia   . Wears glasses   . Complication of anesthesia     woke up too early from a surgery  . Bladder spasms     "spastic bladder" wears pads  . GERD (gastroesophageal reflux disease)     occ. frequent "hiccoughs" after "eating, indigestion"  . Trigger finger, left     alternate fingers affected. -Occ. shooting pain.  . Cancer     Skin cancer -scalp and eyebrow"basal cell"    Past Surgical History  Procedure Laterality Date  . Total hip arthroplasty  2006    right  . Total hip arthroplasty  2006    left  . Tonsillectomy    . Appendectomy    . Carpal tunnel release Left   . Thyroidectomy  1994  . Cardiac catheterization  2006    normal-done for work up pre hip surgeries  . Cholecystectomy  1997  . Abdominal hysterectomy  1989  . Polypectomy Bilateral 06/26/2013    Procedure: BILATERAL EXCISION OF NASAL POLYPS ;  Surgeon: Ascencion Dike, MD;  Location: [place];  Service: ENT;  Laterality: Bilateral;  . Septoplasty N/A 06/26/2013    Procedure: AND SEPTOPLASTY;  Surgeon: Ascencion Dike,  MD;  Location: [place];  Service: ENT;  Laterality: N/A;  . Joint replacement      BTHA  . Total knee arthroplasty Right 12/13/2014    Procedure: RIGHT TOTAL KNEE ARTHROPLASTY;  Surgeon: Gaynelle Arabian, MD;  Location: [facility];  Service: Orthopedics;  Laterality: Right;    There were no vitals filed for this visit.  Visit Diagnosis:  Status post total right knee replacement - Plan: PT plan of care cert/re-cert  Knee pain, right - Plan: PT plan of care cert/re-cert  Decreased range of motion of knee, right - Plan: PT plan of care cert/re-cert  Weakness generalized - Plan: PT plan of care cert/re-cert  Edema - Plan: PT plan of care cert/re-cert      Subjective Assessment - 12/23/14 1456    Subjective Usually pretty swollen, had some spasms yesterday; tries to stay ahead of pain medicine so pain doesn't get too high. Hurts to bend it.    Pertinent History Knee replacement surgery was done on 12/13/14; had seen multiple orthopedic specialists. Had both hips replaced around 2006. Has known for at least 4 years that both knees are bone on bone; finally got knee replacement scheduled.  Went to [facility] after the surgery.   How long can  you sit comfortably? 2 hours in recliner    How long can you stand comfortably? 30 minutes    How long can you walk comfortably? 15 minutes approximately with a walker    Patient Stated Goals getting back to total mobility and get rid of assistive device    Currently in Pain? Yes   Pain Score 4    Pain Location Knee   Pain Orientation Right            [facility] PT Assessment - 12/23/14 0001    Assessment   Medical Diagnosis R total knee replacement    Onset Date 12/13/14   Next MD Visit April 19th    Precautions   Precautions Other (comment)   Precaution Comments bilateral hip replacements 2006   Restrictions   Weight Bearing Restrictions Yes   RLE Weight Bearing Weight bearing as tolerated   Other Position/Activity Restrictions --    Balance Screen   Has the patient fallen in the past 6 months No   Has the patient had a decrease in activity level because of a fear of falling?  Yes   Is the patient reluctant to leave their home because of a fear of falling?  No   Prior Function   Level of Independence Independent with basic ADLs;Independent with gait;Independent with transfers   Vocation Retired   Leisure reading, computer work, sends cards, tries to go to senior exercise class    Observation/Other Assessments   Observations Incision covered with clean white bandage at this time, knee extremely edematous and warm at this time; inflammation noted around the edges of bandage. Patient also states that her temperature is usually on the low side but that it had recently been elevated as compared to her norm   Focus on Therapeutic Outcomes (FOTO)  67% limited    AROM   Right Knee Extension 15   Right Knee Flexion 55   Right Ankle Dorsiflexion 13   Left Ankle Dorsiflexion 13   Strength   Right Hip ABduction 3-/5   Left Hip ABduction 3/5   Right Knee Flexion 3+/5   Right Knee Extension 4-/5   Left Knee Flexion 4/5   Left Knee Extension 4+/5   Right Ankle Dorsiflexion 4+/5   Left Ankle Dorsiflexion 5/5   Ambulation/Gait   Gait Comments Reduced weight bearing down through R leg, reduced weight shift to R, reduced rotation spine and hips, reduced step length L, reduced ankle dorsiflexion R, reduced TKE right, weakness in proximal musculature noted                            PT Education - 12/23/14 1458    Education provided Yes   Education Details educated patient to continue with exercises given to her at [facility]; educated on signs of possible infection in knee and strongly recommended that patient discuss these concerns about knee with MD tomorrow (PT will also contact MD through [facility] regarding knee)   Person(s) Educated Patient   Methods Explanation   Comprehension Verbalized understanding           PT Short Term Goals - 12/23/14 1510    PT SHORT TERM GOAL #1   Title Patient will demonstrate no less than 7 degrees extension and at least 90 degrees flexion in pain free range    Time 2   Period Weeks   Status New   PT SHORT TERM GOAL #2   Title Patient  will demonstrate at least 4/5 strength in bilateral lower extremities and all proximal muscle groups    Time 2   Period Weeks   Status New   PT SHORT TERM GOAL #3   Title Patient will be able to ambulate at least 45 minutes over even and uneven surfaces with single point cane and R knee pain no more than 2/10   Time 2   Period Weeks   Status New   PT SHORT TERM GOAL #4   Title Patient will be able to perform functional sit to stand transfer without the use of her upper extremities and R knee pain no more than 2/10   Time 2   Period Weeks   Status New   PT SHORT TERM GOAL #5   Title Patient to demonstrate reduced levels of edema in her R knee and will be able to successfully self-perform appropriate massage technique to reduce edema and patellar mobilizations safely on an independent basis    Time 2   Period Weeks   Status New           PT Long Term Goals - 12/23/14 1514    PT LONG TERM GOAL #1   Title Patient will be independent in advanced HEP and will be able to perform her exercise routine correctly on a consistent basis    Time 4   Period Weeks   Status New   PT LONG TERM GOAL #2   Title Patient will demonstrate no more than 3 degrees R knee extension and will be able to perform R  knee flexion of at least 115 degrees    Time 4   Period Weeks   Status New   PT LONG TERM GOAL #3   Title Patient will be able to ambulate unlimited distances on level and unlevel surfaces with no assistive device, good balance, and minimal fall risk, R knee pain 0/10   Time 4   Period Weeks   Status New   PT LONG TERM GOAL #4   Title Patient will be able to ascend and descend full flight of stairs with U railing, no  assistive device, and reciprocal pattern free of compensation strategies    Time 4   Period Weeks   Status New   PT LONG TERM GOAL #5   Title Patient will be unlimited in her ability to perform functional tasks and chores around her home with R knee pain 0/10 and no assistive device, minimal rest breaks    Time 4   Period Weeks   Status New               Plan - 12/23/14 1502    Clinical Impression Statement Patient presents with reduced strength in bilateral lower extremities and proximal musculature, pain R knee, signfiicant reductions in range of motion R knee, significant edema R knee, reduced functional activity tolerance, reduced balance skills, and reduced ability to participate in functional tasks overall. At this time the patient did display very large amounts of edema in her right knee as well as iincreased temperature and inflammation around the edges of the bandage that was covering her incision. The patient demonstrates significant reductions in range of motion that is likely related to the high amounts of edema and possible inflammatory process currently present in her knee. At this time she was strongly encouraged to reveal all of these concerns regarding her knee to her MD at her visit on Tuesday, April 19th in order to assist in  accurately identiffying any possible infection that could possibly be present in her knee. Instructed to continue exercises she had already been performing, as aggressive exercise protocol may not be ideal in presence of possible infection. PT to contact MD through [facility] regarding concerns about possible infectious process in knee. Patient will benefit from skilled PT services to address these deficits as well as to assist her in reaching an optimal level of function.     Pt will benefit from skilled therapeutic intervention in order to improve on the following deficits Abnormal gait;Decreased coordination;Decreased range of motion;Difficulty  walking;Impaired flexibility;Improper body mechanics;Decreased endurance;Postural dysfunction;Decreased activity tolerance;Decreased balance;Decreased knowledge of use of DME;Decreased scar mobility;Increased muscle spasms;Pain;Decreased mobility;Decreased strength   Rehab Potential Good   PT Frequency 3x / week   PT Duration 4 weeks   PT Treatment/Interventions ADLs/Self Care Home Management;Gait training;Neuromuscular re-education;Stair training;Functional mobility training;Patient/family education;Cryotherapy;Therapeutic activities;Therapeutic exercise;Manual techniques;DME Instruction;Balance training   PT Next Visit Plan Re-assess status of knee/see what MD said; functional stretching and strengthening as appropriate.    PT Home Exercise Plan To be given next session- patient continuing her current home exercises until that time    Consulted and Agree with Plan of Care Patient          G-Codes - 06-Jan-2015 1519    Functional Assessment Tool Used FOTO 67% limited   Functional Limitation Mobility: Walking and moving around   Mobility: Walking and Moving Around Current Status [phone]) At least 60 percent but less than 80 percent impaired, limited or restricted   Mobility: Walking and Moving Around Goal Status [phone]) At least 40 percent but less than 60 percent impaired, limited or restricted       Problem List Patient Active Problem List   Diagnosis Date Noted  . OA (osteoarthritis) of knee 12/13/2014   Deniece Ree PT, DPT [place] [address] [place], [place], [address] Phone: [phone]   Fax:  [phone]

## 2014-12-24 DIAGNOSIS — Z471 Aftercare following joint replacement surgery: Secondary | ICD-10-CM | POA: Diagnosis not present

## 2014-12-24 DIAGNOSIS — Z96651 Presence of right artificial knee joint: Secondary | ICD-10-CM | POA: Diagnosis not present

## 2014-12-25 ENCOUNTER — Ambulatory Visit (HOSPITAL_COMMUNITY): Payer: Medicare Other | Admitting: Physical Therapy

## 2014-12-25 DIAGNOSIS — Z96651 Presence of right artificial knee joint: Secondary | ICD-10-CM

## 2014-12-25 DIAGNOSIS — M25661 Stiffness of right knee, not elsewhere classified: Secondary | ICD-10-CM | POA: Diagnosis not present

## 2014-12-25 DIAGNOSIS — R609 Edema, unspecified: Secondary | ICD-10-CM

## 2014-12-25 DIAGNOSIS — M6281 Muscle weakness (generalized): Secondary | ICD-10-CM | POA: Diagnosis not present

## 2014-12-25 DIAGNOSIS — Z471 Aftercare following joint replacement surgery: Secondary | ICD-10-CM | POA: Diagnosis not present

## 2014-12-25 DIAGNOSIS — M25461 Effusion, right knee: Secondary | ICD-10-CM | POA: Diagnosis not present

## 2014-12-25 DIAGNOSIS — M25561 Pain in right knee: Secondary | ICD-10-CM | POA: Diagnosis not present

## 2014-12-25 DIAGNOSIS — R531 Weakness: Secondary | ICD-10-CM

## 2014-12-25 DIAGNOSIS — M24661 Ankylosis, right knee: Secondary | ICD-10-CM

## 2014-12-25 NOTE — Patient Instructions (Signed)
KNEE: Extension, Long Arc Quads - Sitting   Raise leg until knee is straight. 20 reps per set, 3 sets per day, 7 days per week  Copyright  VHI. All rights reserved.   Quad Set   With other leg bent, foot flat, slowly tighten muscles on thigh of straight leg while counting out loud to 3. Repeat with other leg. Repeat 20 times. Do 3 sessions per day.   http://gt2.exer.us/276   Copyright  VHI. All rights reserved.   Bracing With Heel Slides (Supine)   With neutral spine, tighten pelvic floor and abdominals and hold. Alternating legs, slide heel to bottom. Repeat 20 times. Do 3 times a day.   Copyright  VHI. All rights reserved.

## 2014-12-25 NOTE — Therapy (Signed)
[place], [place], [address] Phone: [phone]   Fax:  [phone]  Physical Therapy Treatment  Patient Details  Name: [name] MRN: [id number] Date of Birth: 05/05/1941 Referring Provider:  Gaynelle Arabian, MD  Encounter Date: 12/25/2014      PT End of Session - 12/25/14 1051    Visit Number 2   Number of Visits 12   Date for PT Re-Evaluation 01/20/15   Authorization Type Medicare    Authorization - Visit Number 2   Authorization - Number of Visits 10   PT Start Time 1016   PT Stop Time 1100   PT Time Calculation (min) 44 min   Activity Tolerance Patient tolerated treatment well   Behavior During Therapy [facility] for tasks assessed/performed      Past Medical History  Diagnosis Date  . Hypertension   . Hypothyroidism   . Arthritis   . Hyperlipemia   . Wears glasses   . Complication of anesthesia     woke up too early from a surgery  . Bladder spasms     "spastic bladder" wears pads  . GERD (gastroesophageal reflux disease)     occ. frequent "hiccoughs" after "eating, indigestion"  . Trigger finger, left     alternate fingers affected. -Occ. shooting pain.  . Cancer     Skin cancer -scalp and eyebrow"basal cell"    Past Surgical History  Procedure Laterality Date  . Total hip arthroplasty  2006    right  . Total hip arthroplasty  2006    left  . Tonsillectomy    . Appendectomy    . Carpal tunnel release Left   . Thyroidectomy  1994  . Cardiac catheterization  2006    normal-done for work up pre hip surgeries  . Cholecystectomy  1997  . Abdominal hysterectomy  1989  . Polypectomy Bilateral 06/26/2013    Procedure: BILATERAL EXCISION OF NASAL POLYPS ;  Surgeon: Ascencion Dike, MD;  Location: [place];  Service: ENT;  Laterality: Bilateral;  . Septoplasty N/A 06/26/2013    Procedure: AND SEPTOPLASTY;  Surgeon: Ascencion Dike, MD;  Location: [place];  Service:  ENT;  Laterality: N/A;  . Joint replacement      BTHA  . Total knee arthroplasty Right 12/13/2014    Procedure: RIGHT TOTAL KNEE ARTHROPLASTY;  Surgeon: Gaynelle Arabian, MD;  [facility]: [facility];  Service: Orthopedics;  Laterality: Right;    There were no vitals filed for this visit.  Visit Diagnosis:  Status post total right knee replacement  Knee pain, right  Decreased range of motion of knee, right  Weakness generalized  Edema      Subjective Assessment - 12/25/14 1036    Subjective Goals reviewed with patient in agreement. Patient state MD was not happy with redness in patient's knee, thoguh he did not suspect infection and did not give atient any antibiotics.    Currently in Pain? Yes   Pain Score 7  Rt knee anterior           [facility] Adult PT Treatment/Exercise - 12/25/14 0001    Knee/Hip Exercises: Stretches   Active Hamstring Stretch 3 reps;20 seconds   Knee: Self-Stretch Limitations knee drivers  68T 10 second hold   Knee/Hip Exercises: Standing   Functional Squat Limitations Mini squats 10x   Gait Training Walking drills 61f each: heel toe walking, high knees, retro gait, butt kickers, lunge walk (limited  depth)   Knee/Hip Exercises: Seated   Long Arc Quad AAROM;20 reps   Heel Slides 20 reps;AAROM   Knee/Hip Exercises: Supine   Quad Sets Right;20 reps;AROM   Heel Slides AROM;20 reps;Right;AAROM   Heel Slides Limitations 10 with therapist assist   Manual Therapy   Massage soft tissue massage for edema/swelling                 PT Education - 12/25/14 1048    Education provided Yes   Education Details Quad set, long arc quad, heel slides for HEP   Person(s) Educated Patient   Methods Explanation;Demonstration   Comprehension Verbalized understanding;Returned demonstration          PT Short Term Goals - 12/25/14 1119    PT SHORT TERM GOAL #1   Title Patient will demonstrate no less than 7 degrees extension and at least 90 degrees flexion in pain  free range    Status Partially Met   PT SHORT TERM GOAL #2   Title Patient will demonstrate at least 4/5 strength in bilateral lower extremities and all proximal muscle groups    Status On-going   PT SHORT TERM GOAL #3   Title Patient will be able to ambulate at least 45 minutes over even and uneven surfaces with single point cane and R knee pain no more than 2/10   Status On-going   PT SHORT TERM GOAL #4   Title Patient will be able to perform functional sit to stand transfer without the use of her upper extremities and R knee pain no more than 2/10   Status On-going   PT SHORT TERM GOAL #5   Title Patient to demonstrate reduced levels of edema in her R knee and will be able to successfully self-perform appropriate massage technique to reduce edema and patellar mobilizations safely on an independent basis    Status On-going           PT Long Term Goals - 12/25/14 1119    PT LONG TERM GOAL #1   Title Patient will be independent in advanced HEP and will be able to perform her exercise routine correctly on a consistent basis    Status On-going   PT LONG TERM GOAL #2   Title Patient will demonstrate no more than 3 degrees R knee extension and will be able to perform R  knee flexion of at least 115 degrees    Status On-going   PT LONG TERM GOAL #3   Title Patient will be able to ambulate unlimited distances on level and unlevel surfaces with no assistive device, good balance, and minimal fall risk, R knee pain 0/10   Status On-going   PT LONG TERM GOAL #4   Title Patient will be able to ascend and descend full flight of stairs with U railing, no assistive device, and reciprocal pattern free of compensation strategies    Status On-going   PT LONG TERM GOAL #5   Title Patient will be unlimited in her ability to perform functional tasks and chores around her home with R knee pain 0/10 and no assistive device, minimal rest breaks    Status On-going               Plan - 12/25/14  1113    Clinical Impression Statement Patient presents with decreased redness and swelling today along with increased knee extension ROM to -2 degrees from full extesion. Exercises introduced today to increase knee flexion/extension in open and closed chain.  Gait drills performed at end of session to nrmaslize stride length. Attempted mini-squats but depth limited by Lt knee stiffness.    PT Next Visit Plan Introduce standing TKE, heel/toe raises, and continue edema massage to decrease swelling. Also introduce bridges for glute strengthening.         Problem List Patient Active Problem List   Diagnosis Date Noted  . OA (osteoarthritis) of knee 12/13/2014   Devona Konig PT DPT [place], [place], [address] Phone: [phone]   Fax:  [phone]

## 2014-12-27 ENCOUNTER — Ambulatory Visit (HOSPITAL_COMMUNITY): Payer: Medicare Other | Admitting: Physical Therapy

## 2014-12-27 DIAGNOSIS — M6281 Muscle weakness (generalized): Secondary | ICD-10-CM | POA: Diagnosis not present

## 2014-12-27 DIAGNOSIS — M25661 Stiffness of right knee, not elsewhere classified: Secondary | ICD-10-CM | POA: Diagnosis not present

## 2014-12-27 DIAGNOSIS — R609 Edema, unspecified: Secondary | ICD-10-CM

## 2014-12-27 DIAGNOSIS — R531 Weakness: Secondary | ICD-10-CM

## 2014-12-27 DIAGNOSIS — Z96651 Presence of right artificial knee joint: Secondary | ICD-10-CM

## 2014-12-27 DIAGNOSIS — M24661 Ankylosis, right knee: Secondary | ICD-10-CM

## 2014-12-27 DIAGNOSIS — M25561 Pain in right knee: Secondary | ICD-10-CM

## 2014-12-27 DIAGNOSIS — Z471 Aftercare following joint replacement surgery: Secondary | ICD-10-CM | POA: Diagnosis not present

## 2014-12-27 DIAGNOSIS — M25461 Effusion, right knee: Secondary | ICD-10-CM | POA: Diagnosis not present

## 2014-12-27 NOTE — Patient Instructions (Signed)
Stretching: Hamstring (Supine)   Supporting right thigh behind knee, slowly straighten knee until stretch is felt in back of thigh. Hold __30__ seconds. Repeat __3__ times per set. Do __1__ sets per session. Do _2___ sessions per day.  http://orth.exer.us/656   Copyright  VHI. All rights reserved.  Bridging   Slowly raise buttocks from floor, keeping stomach tight. Repeat 10____ times per set. Do ___1_ sets per session. Do _2___ sessions per day.  http://orth.exer.us/1096   Copyright  VHI. All rights reserved.  Dorsiflexion: Self-Mobilization (Sitting)   Feet flat, other foot forward, slide left foot back until gentle stretch is felt. Keep entire foot on floor. Hold 10____ seconds. Relax. Repeat _1___ times per set. Do _1___ sets per session. Do _2___ sessions per day.  http://orth.exer.us/82   Copyright  VHI. All rights reserved.

## 2014-12-27 NOTE — Therapy (Signed)
[place], [place], [address] Phone: [phone]   Fax:  [phone]  Physical Therapy Treatment  Patient Details  Name: [name] MRN: [id number] Date of Birth: 08-10-1941 Referring Provider:  Gaynelle Arabian, MD  Encounter Date: 12/27/2014      PT End of Session - 12/27/14 1606    Visit Number 3   Number of Visits 12   Date for PT Re-Evaluation 01/20/15   Authorization Type Medicare    Authorization Time Period 12/23/14 to 02/22/15   Authorization - Visit Number 3   Authorization - Number of Visits 10   PT Start Time 1520   PT Stop Time 1608   PT Time Calculation (min) 48 min   Activity Tolerance Patient tolerated treatment well      Past Medical History  Diagnosis Date  . Hypertension   . Hypothyroidism   . Arthritis   . Hyperlipemia   . Wears glasses   . Complication of anesthesia     woke up too early from a surgery  . Bladder spasms     "spastic bladder" wears pads  . GERD (gastroesophageal reflux disease)     occ. frequent "hiccoughs" after "eating, indigestion"  . Trigger finger, left     alternate fingers affected. -Occ. shooting pain.  . Cancer     Skin cancer -scalp and eyebrow"basal cell"    Past Surgical History  Procedure Laterality Date  . Total hip arthroplasty  2006    right  . Total hip arthroplasty  2006    left  . Tonsillectomy    . Appendectomy    . Carpal tunnel release Left   . Thyroidectomy  1994  . Cardiac catheterization  2006    normal-done for work up pre hip surgeries  . Cholecystectomy  1997  . Abdominal hysterectomy  1989  . Polypectomy Bilateral 06/26/2013    Procedure: BILATERAL EXCISION OF NASAL POLYPS ;  Surgeon: Ascencion Dike, MD;  Location: [facility];  Service: ENT;  Laterality: Bilateral;  . Septoplasty N/A 06/26/2013    Procedure: AND SEPTOPLASTY;  Surgeon: Ascencion Dike, MD;  Location: [facility] [facility];  Service: ENT;   Laterality: N/A;  . Joint replacement      BTHA  . Total knee arthroplasty Right 12/13/2014    Procedure: RIGHT TOTAL KNEE ARTHROPLASTY;  Surgeon: Gaynelle Arabian, MD;  Location: [facility];  Service: Orthopedics;  Laterality: Right;    There were no vitals filed for this visit.  Visit Diagnosis:  Status post total right knee replacement  Knee pain, right  Decreased range of motion of knee, right  Weakness generalized  Edema      Subjective Assessment - 12/27/14 1519    Subjective Pt states she is doing her HEP and has no questions ;disturbed by the lack of bending    Currently in Pain? Yes   Pain Score 6   with bending   Pain Location Knee   Pain Orientation Right                         [facility] Adult PT Treatment/Exercise - 12/27/14 1524    Knee/Hip Exercises: Stretches   Active Hamstring Stretch 3 reps;30 seconds   Active Hamstring Stretch Limitations supine   Knee/Hip Exercises: Standing   Heel Raises 10 reps   Knee Flexion Right;10 reps   Terminal Knee Extension 10 reps   Knee/Hip Exercises:  Seated   Other Seated Knee Exercises heel slides to increase flexion    Knee/Hip Exercises: Supine   Quad Sets 10 reps   Heel Slides Right;2 sets;10 reps   Bridges Right;10 reps   Knee/Hip Exercises: Sidelying   Other Sidelying Knee Exercises sidelying knee flexion    Manual Therapy   Manual Therapy Edema management   Edema Management decongestive techniques use to decrease edema; posterio aspct completed with pt sidelying                 PT Education - 12/27/14 1603    Education provided Yes   Education Details bridging and supine hamstring stretch for HEP   Person(s) Educated Patient   Methods Explanation;Handout   Comprehension Verbalized understanding;Returned demonstration          PT Short Term Goals - 12/25/14 1119    PT SHORT TERM GOAL #1   Title Patient will demonstrate no less than 7 degrees extension and at least 90 degrees flexion in  pain free range    Status Partially Met   PT SHORT TERM GOAL #2   Title Patient will demonstrate at least 4/5 strength in bilateral lower extremities and all proximal muscle groups    Status On-going   PT SHORT TERM GOAL #3   Title Patient will be able to ambulate at least 45 minutes over even and uneven surfaces with single point cane and R knee pain no more than 2/10   Status On-going   PT SHORT TERM GOAL #4   Title Patient will be able to perform functional sit to stand transfer without the use of her upper extremities and R knee pain no more than 2/10   Status On-going   PT SHORT TERM GOAL #5   Title Patient to demonstrate reduced levels of edema in her R knee and will be able to successfully self-perform appropriate massage technique to reduce edema and patellar mobilizations safely on an independent basis    Status On-going           PT Long Term Goals - 12/25/14 1119    PT LONG TERM GOAL #1   Title Patient will be independent in advanced HEP and will be able to perform her exercise routine correctly on a consistent basis    Status On-going   PT LONG TERM GOAL #2   Title Patient will demonstrate no more than 3 degrees R knee extension and will be able to perform R  knee flexion of at least 115 degrees    Status On-going   PT LONG TERM GOAL #3   Title Patient will be able to ambulate unlimited distances on level and unlevel surfaces with no assistive device, good balance, and minimal fall risk, R knee pain 0/10   Status On-going   PT LONG TERM GOAL #4   Title Patient will be able to ascend and descend full flight of stairs with U railing, no assistive device, and reciprocal pattern free of compensation strategies    Status On-going   PT LONG TERM GOAL #5   Title Patient will be unlimited in her ability to perform functional tasks and chores around her home with R knee pain 0/10 and no assistive device, minimal rest breaks    Status On-going               Plan -  12/27/14 1607    Clinical Impression Statement Pt knee remains swollen and red.  Pt states she had been up on  her knee for two hours yesterday.  Therapist suggested that pt limits being on her feet for15 minutes at time until her swelling goes down.  Pt treatement focused on decreasing her swelling and increasing her flexion.l   PT Next Visit Plan Treatment to continue to focus on knee flexion and decreasing edema         Problem List Patient Active Problem List   Diagnosis Date Noted  . OA (osteoarthritis) of knee 12/13/2014   Rayetta Humphrey, PT CLT [phone] 12/27/2014, 4:20 PM  [facility] [address] [place], [place], [address] Phone: [phone]   Fax:  [phone]

## 2014-12-30 ENCOUNTER — Ambulatory Visit (HOSPITAL_COMMUNITY): Payer: Medicare Other | Admitting: Physical Therapy

## 2014-12-30 DIAGNOSIS — M25561 Pain in right knee: Secondary | ICD-10-CM | POA: Diagnosis not present

## 2014-12-30 DIAGNOSIS — M25661 Stiffness of right knee, not elsewhere classified: Secondary | ICD-10-CM | POA: Diagnosis not present

## 2014-12-30 DIAGNOSIS — Z471 Aftercare following joint replacement surgery: Secondary | ICD-10-CM | POA: Diagnosis not present

## 2014-12-30 DIAGNOSIS — M24661 Ankylosis, right knee: Secondary | ICD-10-CM

## 2014-12-30 DIAGNOSIS — R609 Edema, unspecified: Secondary | ICD-10-CM

## 2014-12-30 DIAGNOSIS — M6281 Muscle weakness (generalized): Secondary | ICD-10-CM | POA: Diagnosis not present

## 2014-12-30 DIAGNOSIS — M25461 Effusion, right knee: Secondary | ICD-10-CM | POA: Diagnosis not present

## 2014-12-30 DIAGNOSIS — Z96651 Presence of right artificial knee joint: Secondary | ICD-10-CM | POA: Diagnosis not present

## 2014-12-30 DIAGNOSIS — R531 Weakness: Secondary | ICD-10-CM

## 2014-12-30 NOTE — Therapy (Signed)
[place], [place], [address] Phone: [phone]   Fax:  [phone]  Physical Therapy Treatment  Patient Details  Name: [name] MRN: [id number] Date of Birth: 14-Jun-1941 Referring Provider:  Gaynelle Arabian, MD  Encounter Date: 12/30/2014      PT End of Session - 12/30/14 1810    Visit Number 4   Number of Visits 12   Date for PT Re-Evaluation 01/20/15   Authorization Type Medicare    Authorization Time Period 12/23/14 to 02/22/15   Authorization - Visit Number 4   Authorization - Number of Visits 10   PT Start Time 1346   PT Stop Time 1430   PT Time Calculation (min) 44 min   Activity Tolerance Patient tolerated treatment well   Behavior During Therapy [facility] for tasks assessed/performed      Past Medical History  Diagnosis Date  . Hypertension   . Hypothyroidism   . Arthritis   . Hyperlipemia   . Wears glasses   . Complication of anesthesia     woke up too early from a surgery  . Bladder spasms     "spastic bladder" wears pads  . GERD (gastroesophageal reflux disease)     occ. frequent "hiccoughs" after "eating, indigestion"  . Trigger finger, left     alternate fingers affected. -Occ. shooting pain.  . Cancer     Skin cancer -scalp and eyebrow"basal cell"    Past Surgical History  Procedure Laterality Date  . Total hip arthroplasty  2006    right  . Total hip arthroplasty  2006    left  . Tonsillectomy    . Appendectomy    . Carpal tunnel release Left   . Thyroidectomy  1994  . Cardiac catheterization  2006    normal-done for work up pre hip surgeries  . Cholecystectomy  1997  . Abdominal hysterectomy  1989  . Polypectomy Bilateral 06/26/2013    Procedure: BILATERAL EXCISION OF NASAL POLYPS ;  Surgeon: Ascencion Dike, MD;  Location: [facility];  Service: ENT;  Laterality: Bilateral;  . Septoplasty N/A 06/26/2013    Procedure: AND SEPTOPLASTY;  Surgeon: Ascencion Dike, MD;   Location: [facility];  Service: ENT;  Laterality: N/A;  . Joint replacement      BTHA  . Total knee arthroplasty Right 12/13/2014    Procedure: RIGHT TOTAL KNEE ARTHROPLASTY;  Surgeon: Gaynelle Arabian, MD;  Location: [facility];  Service: Orthopedics;  Laterality: Right;    There were no vitals filed for this visit.  Visit Diagnosis:  Status post total right knee replacement  Knee pain, right  Decreased range of motion of knee, right  Weakness generalized  Edema      Subjective Assessment - 12/30/14 1808    Subjective Pt states she's been working on increasing her ROM.  STates her knee is not as red and hot as it was but still worried about her swelling.  States the TED hose increases discomfort in her Rt LE.    Currently in Pain? Yes   Pain Score 5    Pain Location Knee   Pain Orientation Right                         [facility] Adult PT Treatment/Exercise - 12/30/14 1357    Knee/Hip Exercises: Stretches   Active Hamstring Stretch 3 reps;30 seconds   Active Hamstring Stretch Limitations standing  12" step   Knee: Self-Stretch Limitations knee drivers  12" box 99B 10 second hold   Knee/Hip Exercises: Standing   Heel Raises 10 reps   Knee Flexion Right;10 reps   Manual Therapy   Manual Therapy Edema management   Edema Management decongestive techniques use to decrease edema; posterio aspct completed with pt sidelying                 PT Education - 12/30/14 1812    Education provided Yes   Education Details Educated on difference between TEDS and compression garments.  Suggested patient may benefit from thigh high compression, 20-50mHg.   Person(s) Educated Patient   Methods Explanation   Comprehension Verbalized understanding          PT Short Term Goals - 12/25/14 1119    PT SHORT TERM GOAL #1   Title Patient will demonstrate no less than 7 degrees extension and at least 90 degrees flexion in pain free range    Status Partially Met    PT SHORT TERM GOAL #2   Title Patient will demonstrate at least 4/5 strength in bilateral lower extremities and all proximal muscle groups    Status On-going   PT SHORT TERM GOAL #3   Title Patient will be able to ambulate at least 45 minutes over even and uneven surfaces with single point cane and R knee pain no more than 2/10   Status On-going   PT SHORT TERM GOAL #4   Title Patient will be able to perform functional sit to stand transfer without the use of her upper extremities and R knee pain no more than 2/10   Status On-going   PT SHORT TERM GOAL #5   Title Patient to demonstrate reduced levels of edema in her R knee and will be able to successfully self-perform appropriate massage technique to reduce edema and patellar mobilizations safely on an independent basis    Status On-going           PT Long Term Goals - 12/25/14 1119    PT LONG TERM GOAL #1   Title Patient will be independent in advanced HEP and will be able to perform her exercise routine correctly on a consistent basis    Status On-going   PT LONG TERM GOAL #2   Title Patient will demonstrate no more than 3 degrees R knee extension and will be able to perform R  knee flexion of at least 115 degrees    Status On-going   PT LONG TERM GOAL #3   Title Patient will be able to ambulate unlimited distances on level and unlevel surfaces with no assistive device, good balance, and minimal fall risk, R knee pain 0/10   Status On-going   PT LONG TERM GOAL #4   Title Patient will be able to ascend and descend full flight of stairs with U railing, no assistive device, and reciprocal pattern free of compensation strategies    Status On-going   PT LONG TERM GOAL #5   Title Patient will be unlimited in her ability to perform functional tasks and chores around her home with R knee pain 0/10 and no assistive device, minimal rest breaks    Status On-going               Plan - 12/30/14 1810    Clinical Impression  Statement continued with main focus on decreasing edema in Rt knee via manual techniques. Explained difference between TEDS and compression hose to patient.  Suggested patient may benefit from thigh high compression hose and advised to contact MD to see about getting a prescription.  Pt verbalized understanding.     PT Next Visit Plan Treatment to continue to focus on knee flexion and decreasing edema         Problem List Patient Active Problem List   Diagnosis Date Noted  . OA (osteoarthritis) of knee 12/13/2014    Teena Irani, PTA/CLT [phone] 12/30/2014, 6:14 PM  [facility] [address] [place], [place], [address] Phone: [phone]   Fax:  [phone]

## 2014-12-31 ENCOUNTER — Ambulatory Visit (HOSPITAL_COMMUNITY): Payer: Medicare Other | Admitting: Physical Therapy

## 2014-12-31 DIAGNOSIS — M6281 Muscle weakness (generalized): Secondary | ICD-10-CM | POA: Diagnosis not present

## 2014-12-31 DIAGNOSIS — M25661 Stiffness of right knee, not elsewhere classified: Secondary | ICD-10-CM | POA: Diagnosis not present

## 2014-12-31 DIAGNOSIS — Z471 Aftercare following joint replacement surgery: Secondary | ICD-10-CM | POA: Diagnosis not present

## 2014-12-31 DIAGNOSIS — M24661 Ankylosis, right knee: Secondary | ICD-10-CM

## 2014-12-31 DIAGNOSIS — R609 Edema, unspecified: Secondary | ICD-10-CM

## 2014-12-31 DIAGNOSIS — Z96651 Presence of right artificial knee joint: Secondary | ICD-10-CM

## 2014-12-31 DIAGNOSIS — M25461 Effusion, right knee: Secondary | ICD-10-CM | POA: Diagnosis not present

## 2014-12-31 DIAGNOSIS — M25561 Pain in right knee: Secondary | ICD-10-CM | POA: Diagnosis not present

## 2014-12-31 DIAGNOSIS — R531 Weakness: Secondary | ICD-10-CM

## 2014-12-31 NOTE — Therapy (Signed)
[place], [place], [address] Phone: [phone]   Fax:  [phone]  Physical Therapy Treatment  Patient Details  Name: [name] MRN: [id number] Date of Birth: October 20, 1940 Referring Provider:  Gaynelle Arabian, MD  Encounter Date: 12/31/2014      PT End of Session - 12/31/14 1046    Visit Number 5   Number of Visits 12   Date for PT Re-Evaluation 01/20/15   Authorization Type Medicare    Authorization Time Period 12/23/14 to 02/22/15   Authorization - Visit Number 5   Authorization - Number of Visits 10   PT Start Time 1017   PT Stop Time 1100   PT Time Calculation (min) 43 min   Activity Tolerance Patient tolerated treatment well   Behavior During Therapy [facility] for tasks assessed/performed      Past Medical History  Diagnosis Date  . Hypertension   . Hypothyroidism   . Arthritis   . Hyperlipemia   . Wears glasses   . Complication of anesthesia     woke up too early from a surgery  . Bladder spasms     "spastic bladder" wears pads  . GERD (gastroesophageal reflux disease)     occ. frequent "hiccoughs" after "eating, indigestion"  . Trigger finger, left     alternate fingers affected. -Occ. shooting pain.  . Cancer     Skin cancer -scalp and eyebrow"basal cell"    Past Surgical History  Procedure Laterality Date  . Total hip arthroplasty  2006    right  . Total hip arthroplasty  2006    left  . Tonsillectomy    . Appendectomy    . Carpal tunnel release Left   . Thyroidectomy  1994  . Cardiac catheterization  2006    normal-done for work up pre hip surgeries  . Cholecystectomy  1997  . Abdominal hysterectomy  1989  . Polypectomy Bilateral 06/26/2013    Procedure: BILATERAL EXCISION OF NASAL POLYPS ;  Surgeon: Ascencion Dike, MD;  Location: [facility];  Service: ENT;  Laterality: Bilateral;  . Septoplasty N/A 06/26/2013    Procedure: AND SEPTOPLASTY;  Surgeon: Ascencion Dike, MD;   Location: [place];  Service: ENT;  Laterality: N/A;  . Joint replacement      BTHA  . Total knee arthroplasty Right 12/13/2014    Procedure: RIGHT TOTAL KNEE ARTHROPLASTY;  Surgeon: Gaynelle Arabian, MD;  Location: [facility];  Service: Orthopedics;  Laterality: Right;    There were no vitals filed for this visit.  Visit Diagnosis:  Status post total right knee replacement  Knee pain, right  Decreased range of motion of knee, right  Weakness generalized  Edema      Subjective Assessment - 12/31/14 1028    Subjective minijmal pain today 3/10, patient presents with walker but notes she minimally uses it and feels comfortable progressing to a cain.    Currently in Pain? Yes   Pain Score 3    Pain Location Knee   Pain Orientation Right            [facility] PT Assessment - 12/31/14 0001    AROM   Right Knee Extension 10           [facility] Adult PT Treatment/Exercise - 12/31/14 0001    Knee/Hip Exercises: Stretches   Active Hamstring Stretch 30 seconds;2 reps   Active Hamstring Stretch Limitations standing 12" step 3 way  Knee: Self-Stretch Limitations knee drivers  6" box 4x 20 second hold   Gastroc Stretch 3 reps;20 seconds   Knee/Hip Exercises: Standing   Heel Raises 10 reps;2 sets   Terminal Knee Extension 10 reps;2 sets;Theraband   Theraband Level (Terminal Knee Extension) Level 4 (Blue)   Functional Squat 2 sets;10 reps   Functional Squat Limitations Mini squats 10x   Gait Training Walking drills 77f each: heel toe walking, high knees, retro gait, butt kickers, lunge walk (limited depth) with cain   Knee/Hip Exercises: Supine   Quad Sets 10 reps;2 sets;Right;AROM                PT Education - 12/30/14 1812    Education provided Yes   Education Details Educated on difference between TEDS and compression garments.  Suggested patient may benefit from thigh high compression, 20-367mg.   Person(s) Educated Patient   Methods Explanation    Comprehension Verbalized understanding          PT Short Term Goals - 12/25/14 1119    PT SHORT TERM GOAL #1   Title Patient will demonstrate no less than 7 degrees extension and at least 90 degrees flexion in pain free range    Status Partially Met   PT SHORT TERM GOAL #2   Title Patient will demonstrate at least 4/5 strength in bilateral lower extremities and all proximal muscle groups    Status On-going   PT SHORT TERM GOAL #3   Title Patient will be able to ambulate at least 45 minutes over even and uneven surfaces with single point cane and R knee pain no more than 2/10   Status On-going   PT SHORT TERM GOAL #4   Title Patient will be able to perform functional sit to stand transfer without the use of her upper extremities and R knee pain no more than 2/10   Status On-going   PT SHORT TERM GOAL #5   Title Patient to demonstrate reduced levels of edema in her R knee and will be able to successfully self-perform appropriate massage technique to reduce edema and patellar mobilizations safely on an independent basis    Status On-going           PT Long Term Goals - 12/25/14 1119    PT LONG TERM GOAL #1   Title Patient will be independent in advanced HEP and will be able to perform her exercise routine correctly on a consistent basis    Status On-going   PT LONG TERM GOAL #2   Title Patient will demonstrate no more than 3 degrees R knee extension and will be able to perform R  knee flexion of at least 115 degrees    Status On-going   PT LONG TERM GOAL #3   Title Patient will be able to ambulate unlimited distances on level and unlevel surfaces with no assistive device, good balance, and minimal fall risk, R knee pain 0/10   Status On-going   PT LONG TERM GOAL #4   Title Patient will be able to ascend and descend full flight of stairs with U railing, no assistive device, and reciprocal pattern free of compensation strategies    Status On-going   PT LONG TERM GOAL #5   Title  Patient will be unlimited in her ability to perform functional tasks and chores around her home with R knee pain 0/10 and no assistive device, minimal rest breaks    Status On-going  Plan - 12/31/14 1047    Clinical Impression Statement With edma improving less focus given to decreasing edema this session. Session focused instead on increasing AROm on Rt knee to normalize gait. introduced ambulation with cain to begin improving mobility and decreasing dependence on walker   PT Next Visit Plan Treatment to continue to focus on knee extension > flexion and decreasing edema         Problem List Patient Active Problem List   Diagnosis Date Noted  . OA (osteoarthritis) of knee 12/13/2014   Devona Konig PT DPT [place], [place], [address] Phone: [phone]   Fax:  [phone]

## 2015-01-02 ENCOUNTER — Ambulatory Visit (HOSPITAL_COMMUNITY): Payer: Medicare Other

## 2015-01-02 DIAGNOSIS — M6281 Muscle weakness (generalized): Secondary | ICD-10-CM | POA: Diagnosis not present

## 2015-01-02 DIAGNOSIS — M25561 Pain in right knee: Secondary | ICD-10-CM | POA: Diagnosis not present

## 2015-01-02 DIAGNOSIS — M24661 Ankylosis, right knee: Secondary | ICD-10-CM

## 2015-01-02 DIAGNOSIS — M25461 Effusion, right knee: Secondary | ICD-10-CM | POA: Diagnosis not present

## 2015-01-02 DIAGNOSIS — R531 Weakness: Secondary | ICD-10-CM

## 2015-01-02 DIAGNOSIS — Z96651 Presence of right artificial knee joint: Secondary | ICD-10-CM

## 2015-01-02 DIAGNOSIS — R609 Edema, unspecified: Secondary | ICD-10-CM

## 2015-01-02 DIAGNOSIS — Z471 Aftercare following joint replacement surgery: Secondary | ICD-10-CM | POA: Diagnosis not present

## 2015-01-02 DIAGNOSIS — M25661 Stiffness of right knee, not elsewhere classified: Secondary | ICD-10-CM | POA: Diagnosis not present

## 2015-01-02 NOTE — Therapy (Signed)
[place], [place], [address] Phone: [phone]   Fax:  [phone]  Physical Therapy Treatment  Patient Details  Name: [name] MRN: [id number] Date of Birth: 02-17-1941 Referring Provider:  Sharilyn Sites, MD  Encounter Date: 01/02/2015      PT End of Session - 01/02/15 1529    Visit Number 6   Number of Visits 12   Date for PT Re-Evaluation 01/20/15   Authorization Type Medicare    Authorization Time Period 12/23/14 to 02/22/15   Authorization - Visit Number 6   Authorization - Number of Visits 10   PT Start Time 1520   PT Stop Time 1607   PT Time Calculation (min) 47 min   Activity Tolerance Patient tolerated treatment well   Behavior During Therapy [facility] for tasks assessed/performed      Past Medical History  Diagnosis Date  . Hypertension   . Hypothyroidism   . Arthritis   . Hyperlipemia   . Wears glasses   . Complication of anesthesia     woke up too early from a surgery  . Bladder spasms     "spastic bladder" wears pads  . GERD (gastroesophageal reflux disease)     occ. frequent "hiccoughs" after "eating, indigestion"  . Trigger finger, left     alternate fingers affected. -Occ. shooting pain.  . Cancer     Skin cancer -scalp and eyebrow"basal cell"    Past Surgical History  Procedure Laterality Date  . Total hip arthroplasty  2006    right  . Total hip arthroplasty  2006    left  . Tonsillectomy    . Appendectomy    . Carpal tunnel release Left   . Thyroidectomy  1994  . Cardiac catheterization  2006    normal-done for work up pre hip surgeries  . Cholecystectomy  1997  . Abdominal hysterectomy  1989  . Polypectomy Bilateral 06/26/2013    Procedure: BILATERAL EXCISION OF NASAL POLYPS ;  Surgeon: Ascencion Dike, MD;  Location: [place];  Service: ENT;  Laterality: Bilateral;  . Septoplasty N/A 06/26/2013    Procedure: AND SEPTOPLASTY;  Surgeon: Ascencion Dike, MD;   Location: [place];  Service: ENT;  Laterality: N/A;  . Joint replacement      BTHA  . Total knee arthroplasty Right 12/13/2014    Procedure: RIGHT TOTAL KNEE ARTHROPLASTY;  Surgeon: Gaynelle Arabian, MD;  Location: [facility];  Service: Orthopedics;  Laterality: Right;    There were no vitals filed for this visit.  Visit Diagnosis:  Status post total right knee replacement  Knee pain, right  Decreased range of motion of knee, right  Weakness generalized  Edema      Subjective Assessment - 01/02/15 1523    Subjective Pt stated she had MD apt earlier today, MD stated no need for the compression hose.  Reported she has been walking a little bit cane, feels uncomfortable walking outside, okay inside    Currently in Pain? Yes   Pain Score 3    Pain Location Knee   Pain Orientation Right   Pain Descriptors / Indicators Tightness;Aching            [facility] Adult PT Treatment/Exercise - 01/02/15 0001    Exercises   Exercises Knee/Hip   Knee/Hip Exercises: Stretches   Active Hamstring Stretch 30 seconds;2 reps   Active Hamstring Stretch Limitations standing 12" step 3 way  Knee: Self-Stretch Limitations knee drivers  6" box 10 reps   Gastroc Stretch 3 reps;30 seconds   Gastroc Stretch Limitations slant board   Knee/Hip Exercises: Standing   Terminal Knee Extension 10 reps;2 sets;Theraband   Theraband Level (Terminal Knee Extension) Level 4 (Blue)   Functional Squat 2 sets;10 reps   Functional Squat Limitations Mini squats 10x   Gait Training Walking drills 247ft each: heel toe walking, high knees, retro gait, butt kickers, lunge walk (limited depth) with cane   Knee/Hip Exercises: Supine   Quad Sets 10 reps;2 sets;Right;AROM   Short Arc Quad Sets Right;10 reps   Heel Slides Right;10 reps   Heel Slides Limitations 6-63   Manual Therapy   Manual Therapy Edema management   Edema Management Retro massage with elevation for edema control              PT  Short Term Goals - 01/02/15 1530    PT SHORT TERM GOAL #1   Title Patient will demonstrate no less than 7 degrees extension and at least 90 degrees flexion in pain free range    Status On-going   PT SHORT TERM GOAL #2   Title Patient will demonstrate at least 4/5 strength in bilateral lower extremities and all proximal muscle groups    Status On-going   PT SHORT TERM GOAL #3   Title Patient will be able to ambulate at least 45 minutes over even and uneven surfaces with single point cane and R knee pain no more than 2/10   Status On-going   PT SHORT TERM GOAL #4   Title Patient will be able to perform functional sit to stand transfer without the use of her upper extremities and R knee pain no more than 2/10   Status On-going   PT SHORT TERM GOAL #5   Title Patient to demonstrate reduced levels of edema in her R knee and will be able to successfully self-perform appropriate massage technique to reduce edema and patellar mobilizations safely on an independent basis    Status On-going           PT Long Term Goals - 01/02/15 1531    PT LONG TERM GOAL #1   Title Patient will be independent in advanced HEP and will be able to perform her exercise routine correctly on a consistent basis    PT LONG TERM GOAL #2   Title Patient will demonstrate no more than 3 degrees R knee extension and will be able to perform R  knee flexion of at least 115 degrees    PT LONG TERM GOAL #3   Title Patient will be able to ambulate unlimited distances on level and unlevel surfaces with no assistive device, good balance, and minimal fall risk, R knee pain 0/10   PT LONG TERM GOAL #4   Title Patient will be able to ascend and descend full flight of stairs with U railing, no assistive device, and reciprocal pattern free of compensation strategies    PT LONG TERM GOAL #5   Title Patient will be unlimited in her ability to perform functional tasks and chores around her home with R knee pain 0/10 and no assistive  device, minimal rest breaks                Plan - 01/02/15 1614    Clinical Impression Statement Session focus on improving AROM primary focus on extension with flexion secondary.  Added SAQ to improve distal quadricep activation with AROM 6-63  this session.  Continued gait training with SPC min cueing to improve TKE with heel strike, pt able to demonstrate appropraite sequencing with new AD.  Pt encouraged to continue practicing with cane indoors but continue with RW outdoors.  Ended session with retro massage for edema control.   PT Next Visit Plan Treatment to continue to focus on knee extension > flexion and decreasing edema         Problem List Patient Active Problem List   Diagnosis Date Noted  . OA (osteoarthritis) of knee 12/13/2014   Ihor Austin, [place]; [place] #[id number] [phone]  Aldona Lento 01/02/2015, 4:21 PM  [place] [address] [place], [place], [address] Phone: [phone]   Fax:  [phone]

## 2015-01-06 ENCOUNTER — Ambulatory Visit (HOSPITAL_COMMUNITY): Payer: Medicare Other | Attending: Orthopedic Surgery | Admitting: Physical Therapy

## 2015-01-06 DIAGNOSIS — M25561 Pain in right knee: Secondary | ICD-10-CM

## 2015-01-06 DIAGNOSIS — Z96651 Presence of right artificial knee joint: Secondary | ICD-10-CM | POA: Diagnosis not present

## 2015-01-06 DIAGNOSIS — M25461 Effusion, right knee: Secondary | ICD-10-CM | POA: Insufficient documentation

## 2015-01-06 DIAGNOSIS — Z471 Aftercare following joint replacement surgery: Secondary | ICD-10-CM | POA: Diagnosis not present

## 2015-01-06 DIAGNOSIS — M25661 Stiffness of right knee, not elsewhere classified: Secondary | ICD-10-CM | POA: Insufficient documentation

## 2015-01-06 DIAGNOSIS — M24661 Ankylosis, right knee: Secondary | ICD-10-CM

## 2015-01-06 DIAGNOSIS — R531 Weakness: Secondary | ICD-10-CM

## 2015-01-06 DIAGNOSIS — M6281 Muscle weakness (generalized): Secondary | ICD-10-CM | POA: Insufficient documentation

## 2015-01-06 DIAGNOSIS — R609 Edema, unspecified: Secondary | ICD-10-CM

## 2015-01-06 NOTE — Therapy (Signed)
[place], [place], [address] Phone: [phone]   Fax:  [phone]  Physical Therapy Treatment  Patient Details  Name: [name] MRN: [id number] Date of Birth: [birth year] Referring Provider:  Gaynelle Arabian, MD  Encounter Date: 01/06/2015      PT End of Session - 01/06/15 1104    Visit Number 7   Number of Visits 12   Date for PT Re-Evaluation 01/20/15   Authorization Type Medicare    Authorization Time Period 12/23/14 to 02/22/15   Authorization - Visit Number 7   Authorization - Number of Visits 10   PT Start Time 1017   PT Stop Time 1058   PT Time Calculation (min) 40 min   Activity Tolerance Patient tolerated treatment well   Behavior During Therapy [facility] for tasks assessed/performed      Past Medical History  Diagnosis Date  . Hypertension   . Hypothyroidism   . Arthritis   . Hyperlipemia   . Wears glasses   . Complication of anesthesia     woke up too early from a surgery  . Bladder spasms     "spastic bladder" wears pads  . GERD (gastroesophageal reflux disease)     occ. frequent "hiccoughs" after "eating, indigestion"  . Trigger finger, left     alternate fingers affected. -Occ. shooting pain.  . Cancer     Skin cancer -scalp and eyebrow"basal cell"    Past Surgical History  Procedure Laterality Date  . Total hip arthroplasty  2006    right  . Total hip arthroplasty  2006    left  . Tonsillectomy    . Appendectomy    . Carpal tunnel release Left   . Thyroidectomy  1994  . Cardiac catheterization  2006    normal-done for work up pre hip surgeries  . Cholecystectomy  1997  . Abdominal hysterectomy  1989  . Polypectomy Bilateral 06/26/2013    Procedure: BILATERAL EXCISION OF NASAL POLYPS ;  Surgeon: Ascencion Dike, MD;  Location: [place];  Service: ENT;  Laterality: Bilateral;  . Septoplasty N/A 06/26/2013    Procedure: AND SEPTOPLASTY;  Surgeon: Ascencion Dike, MD;   Location: [place];  Service: ENT;  Laterality: N/A;  . Joint replacement      BTHA  . Total knee arthroplasty Right 12/13/2014    Procedure: RIGHT TOTAL KNEE ARTHROPLASTY;  Surgeon: Gaynelle Arabian, MD;  [facility];  Service: Orthopedics;  Laterality: Right;    There were no vitals filed for this visit.  Visit Diagnosis:  Status post total right knee replacement  Knee pain, right  Decreased range of motion of knee, right  Weakness generalized  Edema      Subjective Assessment - 01/06/15 1019    Subjective Patient states she is doing well today, no pain today and had a good weekend    Pertinent History Knee replacement surgery was done on 12/13/14; had seen multiple orthopedic specialists. Had both hips replaced around 2006. Has known for at least 4 years that both knees are bone on bone; finally got knee replacement scheduled.  Went to [facility] after the surgery.   Currently in Pain? No/denies                         [facility] Adult PT Treatment/Exercise - 01/06/15 0001    Ambulation/Gait   Ambulation/Gait Yes   Ambulation/Gait Assistance  6: Modified independent (Device/Increase time)   Ambulation Distance (Feet) --  42ftx4   Assistive device Straight cane   Gait Pattern Step-through pattern;Decreased step length - left;Decreased stance time - right;Decreased stride length;Decreased dorsiflexion - right;Decreased dorsiflexion - left;Decreased weight shift to right;Decreased trunk rotation;Trunk flexed   Gait Comments Cues for heel-toe gait pattern, proper sequencing with cane    Knee/Hip Exercises: Stretches   Active Hamstring Stretch 3 reps;30 seconds   Active Hamstring Stretch Limitations 14 inch box, 3 way    Knee: Self-Stretch Limitations knee drivers  6" box 10 reps   Gastroc Stretch 3 reps;30 seconds   Gastroc Stretch Limitations slant board   Knee/Hip Exercises: Standing   Heel Raises 1 set;15 reps   Terminal Knee Extension Right;1  set;10 reps   Theraband Level (Terminal Knee Extension) Level 4 (Blue)   Terminal Knee Extension Limitations 3 second holds    Rocker Board Limitations x20 AP and x20 lateral, U HHA    Knee/Hip Exercises: Supine   Quad Sets Right;1 set;10 reps   [employer] Limitations 5 second holds    Short Arc [employer] Right;1 set;10 reps   Short Arc [employer] Limitations 3 second hold at extension    [employer] Both;1 set;10 reps   Straight Leg Raises Both;1 set;10 reps   Straight Leg Raises Limitations within safe range for hips                 PT Education - 01/06/15 1103    Education provided Yes   Education Details education on appropriate AD height    Person(s) Educated Patient   Methods Explanation   Comprehension Verbalized understanding          PT Short Term Goals - 01/02/15 1530    PT SHORT TERM GOAL #1   Title Patient will demonstrate no less than 7 degrees extension and at least 90 degrees flexion in pain free range    Status On-going   PT SHORT TERM GOAL #2   Title Patient will demonstrate at least 4/5 strength in bilateral lower extremities and all proximal muscle groups    Status On-going   PT SHORT TERM GOAL #3   Title Patient will be able to ambulate at least 45 minutes over even and uneven surfaces with single point cane and R knee pain no more than 2/10   Status On-going   PT SHORT TERM GOAL #4   Title Patient will be able to perform functional sit to stand transfer without the use of her upper extremities and R knee pain no more than 2/10   Status On-going   PT SHORT TERM GOAL #5   Title Patient to demonstrate reduced levels of edema in her R knee and will be able to successfully self-perform appropriate massage technique to reduce edema and patellar mobilizations safely on an independent basis    Status On-going           PT Long Term Goals - 01/02/15 1531    PT LONG TERM GOAL #1   Title Patient will be independent in advanced HEP and will be able to  perform her exercise routine correctly on a consistent basis    PT LONG TERM GOAL #2   Title Patient will demonstrate no more than 3 degrees R knee extension and will be able to perform R  knee flexion of at least 115 degrees    PT LONG TERM GOAL #3   Title Patient will be able to ambulate  unlimited distances on level and unlevel surfaces with no assistive device, good balance, and minimal fall risk, R knee pain 0/10   PT LONG TERM GOAL #4   Title Patient will be able to ascend and descend full flight of stairs with U railing, no assistive device, and reciprocal pattern free of compensation strategies    PT LONG TERM GOAL #5   Title Patient will be unlimited in her ability to perform functional tasks and chores around her home with R knee pain 0/10 and no assistive device, minimal rest breaks                Plan - 01/06/15 1104    Clinical Impression Statement Continued to focus on AROM, mainly extension today, as well as gait mechanics and sequencing of gait with assistive device. Recommend lowering assistive device at least one notch after observing patient's gait today (attempted today but unable to unlock cane due to dirt/gunk in locking mechanism; patient to try putting WD40 on cane before next session to assist in unlocking device).    Pt will benefit from skilled therapeutic intervention in order to improve on the following deficits Abnormal gait;Decreased coordination;Decreased range of motion;Difficulty walking;Impaired flexibility;Improper body mechanics;Decreased endurance;Postural dysfunction;Decreased activity tolerance;Decreased balance;Decreased knowledge of use of DME;Decreased scar mobility;Increased muscle spasms;Pain;Decreased mobility;Decreased strength   Rehab Potential Good   PT Frequency 3x / week   PT Duration 4 weeks   PT Treatment/Interventions ADLs/Self Care Home Management;Gait training;Neuromuscular re-education;Stair training;Functional mobility  training;Patient/family education;Cryotherapy;Therapeutic activities;Therapeutic exercise;Manual techniques;DME Instruction;Balance training   PT Next Visit Plan Treatment to continue to focus on knee extension > flexion and decreasing edema. Lower cane at least one notch (attempted today but stuck, patient to put WD40 on cane to assist in unlocking next session)   PT Home Exercise Plan To be given next session- patient continuing her current home exercises until that time    Consulted and Agree with Plan of Care Patient        Problem List Patient Active Problem List   Diagnosis Date Noted  . OA (osteoarthritis) of knee 12/13/2014   Deniece Ree PT, DPT [place] [address] [place], [place], [address] Phone: [phone]   Fax:  [phone]

## 2015-01-07 NOTE — Addendum Note (Signed)
Addended by: Hunt Oris on: 01/07/2015 08:16 AM   Modules accepted: Orders

## 2015-01-08 ENCOUNTER — Ambulatory Visit (HOSPITAL_COMMUNITY): Payer: Medicare Other | Admitting: Physical Therapy

## 2015-01-08 ENCOUNTER — Encounter (HOSPITAL_COMMUNITY): Payer: 59 | Admitting: Physical Therapy

## 2015-01-08 DIAGNOSIS — M25661 Stiffness of right knee, not elsewhere classified: Secondary | ICD-10-CM | POA: Diagnosis not present

## 2015-01-08 DIAGNOSIS — M25561 Pain in right knee: Secondary | ICD-10-CM | POA: Diagnosis not present

## 2015-01-08 DIAGNOSIS — M6281 Muscle weakness (generalized): Secondary | ICD-10-CM | POA: Diagnosis not present

## 2015-01-08 DIAGNOSIS — M24661 Ankylosis, right knee: Secondary | ICD-10-CM

## 2015-01-08 DIAGNOSIS — Z96651 Presence of right artificial knee joint: Secondary | ICD-10-CM | POA: Diagnosis not present

## 2015-01-08 DIAGNOSIS — R609 Edema, unspecified: Secondary | ICD-10-CM

## 2015-01-08 DIAGNOSIS — R531 Weakness: Secondary | ICD-10-CM

## 2015-01-08 DIAGNOSIS — Z471 Aftercare following joint replacement surgery: Secondary | ICD-10-CM | POA: Diagnosis not present

## 2015-01-08 DIAGNOSIS — M25461 Effusion, right knee: Secondary | ICD-10-CM | POA: Diagnosis not present

## 2015-01-08 NOTE — Therapy (Signed)
[place], [place], [address] Phone: [phone]   Fax:  [phone]  Physical Therapy Treatment  Patient Details  Name: [name] MRN: [id number] Date of Birth: 1941/05/16 Referring Provider:  Gaynelle Arabian, MD  Encounter Date: 01/08/2015      PT End of Session - 01/08/15 1440    Visit Number 8   Number of Visits 12   Date for PT Re-Evaluation 01/20/15   Authorization Type Medicare    Authorization Time Period 12/23/14 to 02/22/15   Authorization - Visit Number 8   Authorization - Number of Visits 10   PT Start Time 3532   PT Stop Time 1434   PT Time Calculation (min) 46 min   Activity Tolerance Patient tolerated treatment well   Behavior During Therapy [facility] for tasks assessed/performed      Past Medical History  Diagnosis Date  . Hypertension   . Hypothyroidism   . Arthritis   . Hyperlipemia   . Wears glasses   . Complication of anesthesia     woke up too early from a surgery  . Bladder spasms     "spastic bladder" wears pads  . GERD (gastroesophageal reflux disease)     occ. frequent "hiccoughs" after "eating, indigestion"  . Trigger finger, left     alternate fingers affected. -Occ. shooting pain.  . Cancer     Skin cancer -scalp and eyebrow"basal cell"    Past Surgical History  Procedure Laterality Date  . Total hip arthroplasty  2006    right  . Total hip arthroplasty  2006    left  . Tonsillectomy    . Appendectomy    . Carpal tunnel release Left   . Thyroidectomy  1994  . Cardiac catheterization  2006    normal-done for work up pre hip surgeries  . Cholecystectomy  1997  . Abdominal hysterectomy  1989  . Polypectomy Bilateral 06/26/2013    Procedure: BILATERAL EXCISION OF NASAL POLYPS ;  Surgeon: Ascencion Dike, MD;  Location: [place];  Service: ENT;  Laterality: Bilateral;  . Septoplasty N/A 06/26/2013    Procedure: AND SEPTOPLASTY;  Surgeon: Ascencion Dike, MD;   Location: [place];  Service: ENT;  Laterality: N/A;  . Joint replacement      BTHA  . Total knee arthroplasty Right 12/13/2014    Procedure: RIGHT TOTAL KNEE ARTHROPLASTY;  Surgeon: Gaynelle Arabian, MD;  [facility];  Service: Orthopedics;  Laterality: Right;    There were no vitals filed for this visit.  Visit Diagnosis:  Status post total right knee replacement  Knee pain, right  Decreased range of motion of knee, right  Edema  Weakness generalized      Subjective Assessment - 01/08/15 1724    Subjective Pt states her knee is just stiff and not moving, however no pain.   Currently in Pain? No/denies            [facility] PT Assessment - 01/08/15 1717    Assessment   Medical Diagnosis R total knee replacement    Onset Date 12/13/14   AROM   Right Knee Extension 7   Right Knee Flexion 55   PROM   Overall PROM Comments knee flexion 65 degrees                     [facility] Adult PT Treatment/Exercise - 01/08/15 1347    Knee/Hip Exercises: Stretches  Active Hamstring Stretch 3 reps;30 seconds   Active Hamstring Stretch Limitations 14 inch box, 3 way    Knee: Self-Stretch Limitations knee drivers  14" box 10 reps   Gastroc Stretch 3 reps;30 seconds   Gastroc Stretch Limitations slant board   Knee/Hip Exercises: Standing   Forward Lunges Right;10 reps   Forward Lunges Limitations 6" box 1 HHA   Side Lunges Right;10 reps   Side Lunges Limitations 6" box 1 HHA   Functional Squat 2 sets;10 reps   Functional Squat Limitations Mini squats 10x   Knee/Hip Exercises: Supine   Quad Sets Right;1 set;10 reps   Short Arc Quad Sets Right;1 set;10 reps   Short Arc Quad Sets Limitations 5" hold   Straight Leg Raises Both;1 set;10 reps   Straight Leg Raises Limitations within safe range for hips    Manual Therapy   Manual Therapy Edema management   Edema Management Retro massage with elevation for edema control   Massage myofascial techiques around  the knee as well                  PT Short Term Goals - 01/02/15 1530    PT SHORT TERM GOAL #1   Title Patient will demonstrate no less than 7 degrees extension and at least 90 degrees flexion in pain free range    Status On-going   PT SHORT TERM GOAL #2   Title Patient will demonstrate at least 4/5 strength in bilateral lower extremities and all proximal muscle groups    Status On-going   PT SHORT TERM GOAL #3   Title Patient will be able to ambulate at least 45 minutes over even and uneven surfaces with single point cane and R knee pain no more than 2/10   Status On-going   PT SHORT TERM GOAL #4   Title Patient will be able to perform functional sit to stand transfer without the use of her upper extremities and R knee pain no more than 2/10   Status On-going   PT SHORT TERM GOAL #5   Title Patient to demonstrate reduced levels of edema in her R knee and will be able to successfully self-perform appropriate massage technique to reduce edema and patellar mobilizations safely on an independent basis    Status On-going           PT Long Term Goals - 01/02/15 1531    PT LONG TERM GOAL #1   Title Patient will be independent in advanced HEP and will be able to perform her exercise routine correctly on a consistent basis    PT LONG TERM GOAL #2   Title Patient will demonstrate no more than 3 degrees R knee extension and will be able to perform R  knee flexion of at least 115 degrees    PT LONG TERM GOAL #3   Title Patient will be able to ambulate unlimited distances on level and unlevel surfaces with no assistive device, good balance, and minimal fall risk, R knee pain 0/10   PT LONG TERM GOAL #4   Title Patient will be able to ascend and descend full flight of stairs with U railing, no assistive device, and reciprocal pattern free of compensation strategies    PT LONG TERM GOAL #5   Title Patient will be unlimited in her ability to perform functional tasks and chores around  her home with R knee pain 0/10 and no assistive device, minimal rest breaks  Plan - 01/08/15 1717    Clinical Impression Statement ROM measured today Rt AROM 7-55 (was 15-55).  PROM for flexion is 65 degrees.  Pt continues to have increased edema and adhesions in Rt knee decreasing ROM.  Focused majority of session on manual to decrease edema in Rt knee.  Extension has improved, however flexion continues to be limited.     PT Next Visit Plan Treatment to continue to focus on increasing flexion/extension and decreasing edema. Quick re-eval next visit for return to MD on Monday.  Check SPC is in right position (adjustment).a        Problem List Patient Active Problem List   Diagnosis Date Noted  . OA (osteoarthritis) of knee 12/13/2014    Teena Irani, PTA/CLT [phone]  01/08/2015, 5:24 PM  [place] [address] [place], [place], [address] Phone: [phone]   Fax:  [phone]

## 2015-01-10 ENCOUNTER — Ambulatory Visit (HOSPITAL_COMMUNITY): Payer: Medicare Other | Admitting: Physical Therapy

## 2015-01-10 DIAGNOSIS — M24661 Ankylosis, right knee: Secondary | ICD-10-CM

## 2015-01-10 DIAGNOSIS — R531 Weakness: Secondary | ICD-10-CM

## 2015-01-10 DIAGNOSIS — M25661 Stiffness of right knee, not elsewhere classified: Secondary | ICD-10-CM | POA: Diagnosis not present

## 2015-01-10 DIAGNOSIS — M6281 Muscle weakness (generalized): Secondary | ICD-10-CM | POA: Diagnosis not present

## 2015-01-10 DIAGNOSIS — M25561 Pain in right knee: Secondary | ICD-10-CM

## 2015-01-10 DIAGNOSIS — Z96651 Presence of right artificial knee joint: Secondary | ICD-10-CM | POA: Diagnosis not present

## 2015-01-10 DIAGNOSIS — R609 Edema, unspecified: Secondary | ICD-10-CM

## 2015-01-10 DIAGNOSIS — Z471 Aftercare following joint replacement surgery: Secondary | ICD-10-CM | POA: Diagnosis not present

## 2015-01-10 DIAGNOSIS — M25461 Effusion, right knee: Secondary | ICD-10-CM | POA: Diagnosis not present

## 2015-01-10 NOTE — Therapy (Signed)
[place], [place], [address] Phone: [phone]   Fax:  [phone]  Physical Therapy Treatment  Patient Details  Name: [name] MRN: [id number] Date of Birth: 08-06-1941 Referring Provider:  Sharilyn Sites, MD  Encounter Date: 01/10/2015      PT End of Session - 01/10/15 1052    Visit Number 9   Number of Visits 12   Date for PT Re-Evaluation 01/20/15   Authorization Type Medicare    Authorization Time Period 12/23/14 to 02/22/15   Authorization - Visit Number 9   Authorization - Number of Visits 10   PT Start Time 1017   PT Stop Time 1100   PT Time Calculation (min) 43 min   Activity Tolerance Patient tolerated treatment well   Behavior During Therapy [facility] for tasks assessed/performed      Past Medical History  Diagnosis Date  . Hypertension   . Hypothyroidism   . Arthritis   . Hyperlipemia   . Wears glasses   . Complication of anesthesia     woke up too early from a surgery  . Bladder spasms     "spastic bladder" wears pads  . GERD (gastroesophageal reflux disease)     occ. frequent "hiccoughs" after "eating, indigestion"  . Trigger finger, left     alternate fingers affected. -Occ. shooting pain.  . Cancer     Skin cancer -scalp and eyebrow"basal cell"    Past Surgical History  Procedure Laterality Date  . Total hip arthroplasty  2006    right  . Total hip arthroplasty  2006    left  . Tonsillectomy    . Appendectomy    . Carpal tunnel release Left   . Thyroidectomy  1994  . Cardiac catheterization  2006    normal-done for work up pre hip surgeries  . Cholecystectomy  1997  . Abdominal hysterectomy  1989  . Polypectomy Bilateral 06/26/2013    Procedure: BILATERAL EXCISION OF NASAL POLYPS ;  Surgeon: Ascencion Dike, MD;  Location: [place];  Service: ENT;  Laterality: Bilateral;  . Septoplasty N/A 06/26/2013    Procedure: AND SEPTOPLASTY;  Surgeon: Ascencion Dike, MD;   Location: [place];  Service: ENT;  Laterality: N/A;  . Joint replacement      BTHA  . Total knee arthroplasty Right 12/13/2014    Procedure: RIGHT TOTAL KNEE ARTHROPLASTY;  Surgeon: Gaynelle Arabian, MD;  [facility]: [facility];  Service: Orthopedics;  Laterality: Right;    There were no vitals filed for this visit.  Visit Diagnosis:  Status post total right knee replacement  Knee pain, right  Edema  Decreased range of motion of knee, right  Weakness generalized      Subjective Assessment - 01/10/15 1042    Subjective Pt states her knee is just stiff and not moving, however no pain.   Currently in Pain? No/denies            [facility] PT Assessment - 01/10/15 0001    AROM   Right Knee Extension 7   Right Knee Flexion 55            [facility] Adult PT Treatment/Exercise - 01/10/15 0001    Knee/Hip Exercises: Stretches   Active Hamstring Stretch 3 reps;30 seconds   Active Hamstring Stretch Limitations 14 inch box, 3 way    Hip Flexor Stretch Limitations 10x 3secobnds   Knee: Self-Stretch Limitations knee drivers  14" box  10 reps   Gastroc Stretch 3 reps;30 seconds   Gastroc Stretch Limitations slant board   Knee/Hip Exercises: Standing   Heel Raises 2 sets;10 reps   Functional Squat 2 sets;10 reps   Functional Squat Limitations Mini squats 10x second set split stance 10x ach   Other Standing Knee Exercises standing hip abduction to increased glut med activation for gait.    Knee/Hip Exercises: Seated   Long Arc Quad AROM;1 set;20 reps   Heel Slides 20 reps;AROM   Knee/Hip Exercises: Supine   Quad Sets Right;10 reps;3 sets   Short Arc Quad Sets Right;1 set;10 reps   Straight Leg Raises Both;1 set;10 reps   Straight Leg Raises Limitations within safe range for hips    Manual Therapy   Manual Therapy Other (comment)   Massage myofascial techiques around the knee as well   Other Manual Therapy scar mobilization of Rt TKA             PT Short Term Goals  - 01/02/15 1530    PT SHORT TERM GOAL #1   Title Patient will demonstrate no less than 7 degrees extension and at least 90 degrees flexion in pain free range    Status On-going   PT SHORT TERM GOAL #2   Title Patient will demonstrate at least 4/5 strength in bilateral lower extremities and all proximal muscle groups    Status On-going   PT SHORT TERM GOAL #3   Title Patient will be able to ambulate at least 45 minutes over even and uneven surfaces with single point cane and R knee pain no more than 2/10   Status On-going   PT SHORT TERM GOAL #4   Title Patient will be able to perform functional sit to stand transfer without the use of her upper extremities and R knee pain no more than 2/10   Status On-going   PT SHORT TERM GOAL #5   Title Patient to demonstrate reduced levels of edema in her R knee and will be able to successfully self-perform appropriate massage technique to reduce edema and patellar mobilizations safely on an independent basis    Status On-going           PT Long Term Goals - 01/02/15 1531    PT LONG TERM GOAL #1   Title Patient will be independent in advanced HEP and will be able to perform her exercise routine correctly on a consistent basis    PT LONG TERM GOAL #2   Title Patient will demonstrate no more than 3 degrees R knee extension and will be able to perform R  knee flexion of at least 115 degrees    PT LONG TERM GOAL #3   Title Patient will be able to ambulate unlimited distances on level and unlevel surfaces with no assistive device, good balance, and minimal fall risk, R knee pain 0/10   PT LONG TERM GOAL #4   Title Patient will be able to ascend and descend full flight of stairs with U railing, no assistive device, and reciprocal pattern free of compensation strategies    PT LONG TERM GOAL #5   Title Patient will be unlimited in her ability to perform functional tasks and chores around her home with R knee pain 0/10 and no assistive device, minimal rest  breaks                Plan - 01/10/15 1053    Clinical Impression Statement Exercises continued with predominant focus on  increasing knee flexion and extension ROM. Manual therapy performed to increase scar mobioitly as knee flexion/extension appears most limited by limited scar mobility. Progressed minisquats to split stance for increased strenthening.   PT Next Visit Plan Reassess and update G-Code. continue to increase knee flexion and extension to normalize gait.         Problem List Patient Active Problem List   Diagnosis Date Noted  . OA (osteoarthritis) of knee 12/13/2014   Devona Konig PT DPT [employer] [place], [place], [address] Phone: [phone]   Fax:  [phone]

## 2015-01-13 ENCOUNTER — Encounter (HOSPITAL_COMMUNITY): Payer: 59 | Admitting: Physical Therapy

## 2015-01-13 DIAGNOSIS — Z96651 Presence of right artificial knee joint: Secondary | ICD-10-CM | POA: Diagnosis not present

## 2015-01-13 DIAGNOSIS — Z471 Aftercare following joint replacement surgery: Secondary | ICD-10-CM | POA: Diagnosis not present

## 2015-01-14 ENCOUNTER — Ambulatory Visit (HOSPITAL_COMMUNITY): Payer: Medicare Other | Admitting: Physical Therapy

## 2015-01-14 DIAGNOSIS — Z471 Aftercare following joint replacement surgery: Secondary | ICD-10-CM | POA: Diagnosis not present

## 2015-01-14 DIAGNOSIS — R531 Weakness: Secondary | ICD-10-CM

## 2015-01-14 DIAGNOSIS — M6281 Muscle weakness (generalized): Secondary | ICD-10-CM | POA: Diagnosis not present

## 2015-01-14 DIAGNOSIS — M25561 Pain in right knee: Secondary | ICD-10-CM

## 2015-01-14 DIAGNOSIS — Z96651 Presence of right artificial knee joint: Secondary | ICD-10-CM

## 2015-01-14 DIAGNOSIS — M24661 Ankylosis, right knee: Secondary | ICD-10-CM

## 2015-01-14 DIAGNOSIS — M25461 Effusion, right knee: Secondary | ICD-10-CM | POA: Diagnosis not present

## 2015-01-14 DIAGNOSIS — M25661 Stiffness of right knee, not elsewhere classified: Secondary | ICD-10-CM | POA: Diagnosis not present

## 2015-01-14 DIAGNOSIS — R609 Edema, unspecified: Secondary | ICD-10-CM

## 2015-01-14 NOTE — Therapy (Signed)
[place], [place], [address] Phone: [phone]   Fax:  [phone]  Physical Therapy Treatment  Patient Details  Name: [name] MRN: [id number] Date of Birth: 11/11/1940 Referring Provider:  Sharilyn Sites, MD  Encounter Date: 01/14/2015      PT End of Session - 01/14/15 1621    Visit Number 10   Number of Visits 12   Date for PT Re-Evaluation 01/20/15   Authorization Type Medicare    Authorization Time Period 12/23/14 to 02/22/15; G-code done 10th visit    Authorization - Visit Number 10   Authorization - Number of Visits 10   Activity Tolerance Patient tolerated treatment well   Behavior During Therapy [facility] for tasks assessed/performed      Past Medical History  Diagnosis Date  . Hypertension   . Hypothyroidism   . Arthritis   . Hyperlipemia   . Wears glasses   . Complication of anesthesia     woke up too early from a surgery  . Bladder spasms     "spastic bladder" wears pads  . GERD (gastroesophageal reflux disease)     occ. frequent "hiccoughs" after "eating, indigestion"  . Trigger finger, left     alternate fingers affected. -Occ. shooting pain.  . Cancer     Skin cancer -scalp and eyebrow"basal cell"    Past Surgical History  Procedure Laterality Date  . Total hip arthroplasty  2006    right  . Total hip arthroplasty  2006    left  . Tonsillectomy    . Appendectomy    . Carpal tunnel release Left   . Thyroidectomy  1994  . Cardiac catheterization  2006    normal-done for work up pre hip surgeries  . Cholecystectomy  1997  . Abdominal hysterectomy  1989  . Polypectomy Bilateral 06/26/2013    Procedure: BILATERAL EXCISION OF NASAL POLYPS ;  Surgeon: Ascencion Dike, MD;  Location: [place];  Service: ENT;  Laterality: Bilateral;  . Septoplasty N/A 06/26/2013    Procedure: AND SEPTOPLASTY;  Surgeon: Ascencion Dike, MD;  Location: [place];  Service: ENT;   Laterality: N/A;  . Joint replacement      BTHA  . Total knee arthroplasty Right 12/13/2014    Procedure: RIGHT TOTAL KNEE ARTHROPLASTY;  Surgeon: Gaynelle Arabian, MD;  [facility];  Service: Orthopedics;  Laterality: Right;    There were no vitals filed for this visit.  Visit Diagnosis:  Status post total right knee replacement  Knee pain, right  Edema  Weakness generalized  Decreased range of motion of knee, right      Subjective Assessment - 01/14/15 1603    Subjective Patient reports that she is having some pain today, also reports that she was tired after last session and also lost her HEP update after last session   Pertinent History Knee replacement surgery was done on 12/13/14; had seen multiple orthopedic specialists. Had both hips replaced around 2006. Has known for at least 4 years that both knees are bone on bone; finally got knee replacement scheduled.  Went to [employer] after the surgery.   Pain Score 3    Pain Location Knee   Pain Orientation Right            [facility] PT Assessment - 01/14/15 0001    Observation/Other Assessments   Focus on Therapeutic Outcomes (FOTO)  51% limited  [facility] Adult PT Treatment/Exercise - 01/14/15 0001    Knee/Hip Exercises: Stretches   Active Hamstring Stretch 3 reps;30 seconds   Active Hamstring Stretch Limitations 14 inch box, 3 way    Gastroc Stretch 3 reps;30 seconds   Gastroc Stretch Limitations slant board   Knee/Hip Exercises: Standing   Heel Raises 1 set;15 reps   Heel Raises Limitations floor    Forward Lunges Both;1 set;10 reps   Forward Lunges Limitations 6 inch box    Side Lunges Both;1 set;10 reps   Side Lunges Limitations 6 inch box    Rocker Board Limitations x20 AP and x20 lateral, U HHA   SLS with Vectors     Other Standing Knee Exercises Forward knee drives 7D22   Other Standing Knee Exercises 3D hip excursions neutral stance 1x10                PT Education  - 01/14/15 1616    Education provided Yes   Education Details education regarding proper use of ice at home; attempted to find HEP handout that was given to patient last session however patient stated that all presented handouts were not the one    Person(s) Educated Patient   Methods Explanation   Comprehension Verbalized understanding          PT Short Term Goals - 01/02/15 1530    PT SHORT TERM GOAL #1   Title Patient will demonstrate no less than 7 degrees extension and at least 90 degrees flexion in pain free range    Status On-going   PT SHORT TERM GOAL #2   Title Patient will demonstrate at least 4/5 strength in bilateral lower extremities and all proximal muscle groups    Status On-going   PT SHORT TERM GOAL #3   Title Patient will be able to ambulate at least 45 minutes over even and uneven surfaces with single point cane and R knee pain no more than 2/10   Status On-going   PT SHORT TERM GOAL #4   Title Patient will be able to perform functional sit to stand transfer without the use of her upper extremities and R knee pain no more than 2/10   Status On-going   PT SHORT TERM GOAL #5   Title Patient to demonstrate reduced levels of edema in her R knee and will be able to successfully self-perform appropriate massage technique to reduce edema and patellar mobilizations safely on an independent basis    Status On-going           PT Long Term Goals - 01/02/15 1531    PT LONG TERM GOAL #1   Title Patient will be independent in advanced HEP and will be able to perform her exercise routine correctly on a consistent basis    PT LONG TERM GOAL #2   Title Patient will demonstrate no more than 3 degrees R knee extension and will be able to perform R  knee flexion of at least 115 degrees    PT LONG TERM GOAL #3   Title Patient will be able to ambulate unlimited distances on level and unlevel surfaces with no assistive device, good balance, and minimal fall risk, R knee pain 0/10    PT LONG TERM GOAL #4   Title Patient will be able to ascend and descend full flight of stairs with U railing, no assistive device, and reciprocal pattern free of compensation strategies    PT LONG TERM GOAL #5   Title Patient will be  unlimited in her ability to perform functional tasks and chores around her home with R knee pain 0/10 and no assistive device, minimal rest breaks                Plan - 02-11-15 1621    Clinical Impression Statement Continued functional exercises today with good tolerance by patient; some difficulty on rockerboard without use of UEs. Overall patient appears to be improving but continues to experience pain in her knee during functional tasks and exercises, possible in part due to reduced scar mobility. Attempted to identify handout that patient had received last session (personal printout from another PT, not in epic system), patient refused multiple exercises stating that were not the one that was given to her; finally advised patient that HEP willl be adjusted next session with that exercise and/or another appropriate exercise.    Pt will benefit from skilled therapeutic intervention in order to improve on the following deficits Abnormal gait;Decreased coordination;Decreased range of motion;Difficulty walking;Impaired flexibility;Improper body mechanics;Decreased endurance;Postural dysfunction;Decreased activity tolerance;Decreased balance;Decreased knowledge of use of DME;Decreased scar mobility;Increased muscle spasms;Pain;Decreased mobility;Decreased strength   Rehab Potential Good   PT Frequency 3x / week   PT Duration 4 weeks   PT Treatment/Interventions ADLs/Self Care Home Management;Gait training;Neuromuscular re-education;Stair training;Functional mobility training;Patient/family education;Cryotherapy;Therapeutic activities;Therapeutic exercise;Manual techniques;DME Instruction;Balance training   PT Next Visit Plan Continue to address knee flexion and  extension to normalize gait; re-attempt to identify HEP exercise added last session and add/adjust HEP with appropriate exercises    Consulted and Agree with Plan of Care Patient          G-Codes - 02-11-15 1619    Functional Assessment Tool Used FOTO 51% limited    Functional Limitation Mobility: Walking and moving around   Mobility: Walking and Moving Around Current Status [id number]) At least 40 percent but less than 60 percent impaired, limited or restricted   Mobility: Walking and Moving Around Goal Status [phone]) At least 20 percent but less than 40 percent impaired, limited or restricted      Problem List Patient Active Problem List   Diagnosis Date Noted  . OA (osteoarthritis) of knee 12/13/2014    Deniece Ree PT, DPT [place] [address] [place], [place], [address] Phone: [phone]   Fax:  [phone]

## 2015-01-15 ENCOUNTER — Ambulatory Visit (HOSPITAL_COMMUNITY): Payer: Medicare Other | Admitting: Physical Therapy

## 2015-01-15 DIAGNOSIS — M24661 Ankylosis, right knee: Secondary | ICD-10-CM

## 2015-01-15 DIAGNOSIS — Z96651 Presence of right artificial knee joint: Secondary | ICD-10-CM

## 2015-01-15 DIAGNOSIS — M25561 Pain in right knee: Secondary | ICD-10-CM | POA: Diagnosis not present

## 2015-01-15 DIAGNOSIS — M25661 Stiffness of right knee, not elsewhere classified: Secondary | ICD-10-CM | POA: Diagnosis not present

## 2015-01-15 DIAGNOSIS — M25461 Effusion, right knee: Secondary | ICD-10-CM | POA: Diagnosis not present

## 2015-01-15 DIAGNOSIS — R609 Edema, unspecified: Secondary | ICD-10-CM

## 2015-01-15 DIAGNOSIS — Z471 Aftercare following joint replacement surgery: Secondary | ICD-10-CM | POA: Diagnosis not present

## 2015-01-15 DIAGNOSIS — R531 Weakness: Secondary | ICD-10-CM

## 2015-01-15 DIAGNOSIS — M6281 Muscle weakness (generalized): Secondary | ICD-10-CM | POA: Diagnosis not present

## 2015-01-15 NOTE — Therapy (Signed)
[place], [place], [address] Phone: [phone]   Fax:  [phone]  Physical Therapy Treatment  Patient Details  Name: [name] MRN: [id number] Date of Birth: 1941/02/02 Referring Provider:  Gaynelle Arabian, MD  Encounter Date: 01/15/2015      PT End of Session - 01/15/15 1044    Visit Number 11   Number of Visits 12   Date for PT Re-Evaluation 01/20/15   Authorization Type Medicare    Authorization Time Period 12/23/14 to 02/22/15; G-code done 10th visit    Authorization - Visit Number 11   Authorization - Number of Visits 10   PT Start Time 0932   PT Stop Time 1020   PT Time Calculation (min) 48 min   Activity Tolerance Patient tolerated treatment well   Behavior During Therapy [facility] for tasks assessed/performed      Past Medical History  Diagnosis Date  . Hypertension   . Hypothyroidism   . Arthritis   . Hyperlipemia   . Wears glasses   . Complication of anesthesia     woke up too early from a surgery  . Bladder spasms     "spastic bladder" wears pads  . GERD (gastroesophageal reflux disease)     occ. frequent "hiccoughs" after "eating, indigestion"  . Trigger finger, left     alternate fingers affected. -Occ. shooting pain.  . Cancer     Skin cancer -scalp and eyebrow"basal cell"    Past Surgical History  Procedure Laterality Date  . Total hip arthroplasty  2006    right  . Total hip arthroplasty  2006    left  . Tonsillectomy    . Appendectomy    . Carpal tunnel release Left   . Thyroidectomy  1994  . Cardiac catheterization  2006    normal-done for work up pre hip surgeries  . Cholecystectomy  1997  . Abdominal hysterectomy  1989  . Polypectomy Bilateral 06/26/2013    Procedure: BILATERAL EXCISION OF NASAL POLYPS ;  Surgeon: Ascencion Dike, MD;  Location: [place];  Service: ENT;  Laterality: Bilateral;  . Septoplasty N/A 06/26/2013    Procedure: AND  SEPTOPLASTY;  Surgeon: Ascencion Dike, MD;  Location: [place];  Service: ENT;  Laterality: N/A;  . Joint replacement      BTHA  . Total knee arthroplasty Right 12/13/2014    Procedure: RIGHT TOTAL KNEE ARTHROPLASTY;  Surgeon: Gaynelle Arabian, MD;  [facility];  Service: Orthopedics;  Laterality: Right;    There were no vitals filed for this visit.  Visit Diagnosis:  Status post total right knee replacement  Knee pain, right  Edema  Weakness generalized  Decreased range of motion of knee, right      Subjective Assessment - 01/15/15 0948    Subjective Pt states she is not hurting today.  PT states she was sore on Friday but not after last session.    Currently in Pain? No/denies            [facility] PT Assessment - 01/15/15 1038    Assessment   Medical Diagnosis R total knee replacement    Onset Date 12/13/14   AROM   Right Knee Flexion 72                     [facility] Adult PT Treatment/Exercise - 01/15/15 0948    Knee/Hip Exercises: Stretches   Active Hamstring Stretch  3 reps;30 seconds   Active Hamstring Stretch Limitations 14 inch box, 3 way    Knee: Self-Stretch Limitations knee drivers  8" box 10 reps   Gastroc Stretch 3 reps;30 seconds   Gastroc Stretch Limitations slant board   Knee/Hip Exercises: Standing   Heel Raises 15 reps   Forward Lunges Both;1 set;10 reps   Forward Lunges Limitations 4" box no UE's   Rocker Board Limitations x20 AP and x20 lateral, U HHA   Other Standing Knee Exercises 3D hip excursions neutral stance 1x10   Knee/Hip Exercises: Supine   Knee Flexion PROM;AROM   Knee Flexion Limitations AROM 72 degrees.  PROM X 3 sets   Manual Therapy   Manual Therapy Myofascial release   Massage myofascial techiques around the knee as well   Other Manual Therapy scar mobilization of Rt TKA                PT Education - 01/15/15 1036    Education provided Yes   Education Details Pt given HEP for hip excursions and  LE stretches ([employer] handout)   Person(s) Educated Patient   Methods Explanation;Demonstration;Handout   Comprehension Verbalized understanding;Returned demonstration          PT Short Term Goals - 01/02/15 1530    PT SHORT TERM GOAL #1   Title Patient will demonstrate no less than 7 degrees extension and at least 90 degrees flexion in pain free range    Status On-going   PT SHORT TERM GOAL #2   Title Patient will demonstrate at least 4/5 strength in bilateral lower extremities and all proximal muscle groups    Status On-going   PT SHORT TERM GOAL #3   Title Patient will be able to ambulate at least 45 minutes over even and uneven surfaces with single point cane and R knee pain no more than 2/10   Status On-going   PT SHORT TERM GOAL #4   Title Patient will be able to perform functional sit to stand transfer without the use of her upper extremities and R knee pain no more than 2/10   Status On-going   PT SHORT TERM GOAL #5   Title Patient to demonstrate reduced levels of edema in her R knee and will be able to successfully self-perform appropriate massage technique to reduce edema and patellar mobilizations safely on an independent basis    Status On-going           PT Long Term Goals - 01/02/15 1531    PT LONG TERM GOAL #1   Title Patient will be independent in advanced HEP and will be able to perform her exercise routine correctly on a consistent basis    PT LONG TERM GOAL #2   Title Patient will demonstrate no more than 3 degrees R knee extension and will be able to perform R  knee flexion of at least 115 degrees    PT LONG TERM GOAL #3   Title Patient will be able to ambulate unlimited distances on level and unlevel surfaces with no assistive device, good balance, and minimal fall risk, R knee pain 0/10   PT LONG TERM GOAL #4   Title Patient will be able to ascend and descend full flight of stairs with U railing, no assistive device, and reciprocal pattern free of  compensation strategies    PT LONG TERM GOAL #5   Title Patient will be unlimited in her ability to perform functional tasks and chores around her home with  R knee pain 0/10 and no assistive device, minimal rest breaks                Plan - 01/15/15 1046    Clinical Impression Statement Major limitation continues to be ROM.  Pt with less swelling perimeter of knee, however continues to have adhesions mostly lateral of Rt knee.  Good release following myofascial techniques.  Remeasured flexion and now has 72 degrees (pt states MD measured last week in sitting at 90 degrees).  ROM has improved  17 degrees since IE but is still very limited.  Completed PROM to increase knee flexion with good tolerance.     PT Next Visit Plan Main focus should continue to be on ROM.  Re-evaluate next visit and discuss whether JAS may be beneficial to further increase AROM.   Consulted and Agree with Plan of Care Patient      Problem List Patient Active Problem List   Diagnosis Date Noted  . OA (osteoarthritis) of knee 12/13/2014    Teena Irani, PTA/CLT [phone] 01/15/2015, 11:02 AM  [place], [place], [address] Phone: [phone]   Fax:  [phone]

## 2015-01-16 ENCOUNTER — Ambulatory Visit (HOSPITAL_COMMUNITY): Payer: Medicare Other | Admitting: Physical Therapy

## 2015-01-16 DIAGNOSIS — R531 Weakness: Secondary | ICD-10-CM

## 2015-01-16 DIAGNOSIS — M24661 Ankylosis, right knee: Secondary | ICD-10-CM

## 2015-01-16 DIAGNOSIS — M25461 Effusion, right knee: Secondary | ICD-10-CM | POA: Diagnosis not present

## 2015-01-16 DIAGNOSIS — M25661 Stiffness of right knee, not elsewhere classified: Secondary | ICD-10-CM | POA: Diagnosis not present

## 2015-01-16 DIAGNOSIS — Z471 Aftercare following joint replacement surgery: Secondary | ICD-10-CM | POA: Diagnosis not present

## 2015-01-16 DIAGNOSIS — M6281 Muscle weakness (generalized): Secondary | ICD-10-CM | POA: Diagnosis not present

## 2015-01-16 DIAGNOSIS — M25561 Pain in right knee: Secondary | ICD-10-CM

## 2015-01-16 DIAGNOSIS — R609 Edema, unspecified: Secondary | ICD-10-CM

## 2015-01-16 DIAGNOSIS — Z96651 Presence of right artificial knee joint: Secondary | ICD-10-CM

## 2015-01-16 NOTE — Therapy (Signed)
[place], [place], [address] Phone: [phone]   Fax:  [phone]  Physical Therapy Treatment  Patient Details  Name: [name] MRN: [id number] Date of Birth: 1940-12-18 Referring Provider:  Sharilyn Sites, MD  Encounter Date: 01/16/2015      PT End of Session - 01/16/15 1302    Visit Number 12   Number of Visits 24   Date for PT Re-Evaluation 02/15/15   Authorization Type Medicare    Authorization Time Period 12/23/14 to 02/22/15; G-code done 10th visit    Authorization - Visit Number 12   Authorization - Number of Visits 20   PT Start Time 5732   PT Stop Time 1107   PT Time Calculation (min) 44 min      Past Medical History  Diagnosis Date  . Hypertension   . Hypothyroidism   . Arthritis   . Hyperlipemia   . Wears glasses   . Complication of anesthesia     woke up too early from a surgery  . Bladder spasms     "spastic bladder" wears pads  . GERD (gastroesophageal reflux disease)     occ. frequent "hiccoughs" after "eating, indigestion"  . Trigger finger, left     alternate fingers affected. -Occ. shooting pain.  . Cancer     Skin cancer -scalp and eyebrow"basal cell"    Past Surgical History  Procedure Laterality Date  . Total hip arthroplasty  2006    right  . Total hip arthroplasty  2006    left  . Tonsillectomy    . Appendectomy    . Carpal tunnel release Left   . Thyroidectomy  1994  . Cardiac catheterization  2006    normal-done for work up pre hip surgeries  . Cholecystectomy  1997  . Abdominal hysterectomy  1989  . Polypectomy Bilateral 06/26/2013    Procedure: BILATERAL EXCISION OF NASAL POLYPS ;  Surgeon: Ascencion Dike, MD;  Location: [facility];  Service: ENT;  Laterality: Bilateral;  . Septoplasty N/A 06/26/2013    Procedure: AND SEPTOPLASTY;  Surgeon: Ascencion Dike, MD;  Location: [facility];  Service: ENT;  Laterality: N/A;  . Joint  replacement      BTHA  . Total knee arthroplasty Right 12/13/2014    Procedure: RIGHT TOTAL KNEE ARTHROPLASTY;  Surgeon: Gaynelle Arabian, MD;  Location: [facility];  Service: Orthopedics;  Laterality: Right;    There were no vitals filed for this visit.  Visit Diagnosis:  Status post total right knee replacement  Knee pain, right  Edema  Weakness generalized  Decreased range of motion of knee, right      Subjective Assessment - 01/16/15 1027    Subjective Pt states she is not having any pain now but she was this morning therefore she took a pain pill.  She is increasing her walking.  She went to the MD Monday who was not happy with her flexion    Currently in Pain? Yes   Pain Score 2    Pain Location Knee   Pain Orientation Right   Pain Descriptors / Indicators Aching            [facility] PT Assessment - 01/16/15 0001    Assessment   Medical Diagnosis R total knee replacement    Onset Date 12/13/14   AROM   Right Knee Extension 2 was 15   Right Knee Flexion 88 was 55  PROM   Overall PROM Comments 0-92   Strength   Right Hip Flexion 5/5   Right Hip ABduction 4+/5  was 3-/5   Left Hip Extension 3/5   Right Knee Flexion 5/5  3+/5   Left Knee Extension 5/5  was 4+/5   Right Ankle Dorsiflexion 5/5  was 4+/5              [facility] Adult PT Treatment/Exercise - 01/16/15 0001    Knee/Hip Exercises: Stretches   Sports administrator 3 reps;30 seconds   Quad Stretch Limitations prone   Knee/Hip Exercises: Supine   Quad Sets 10 reps   Heel Slides 15 reps   Heel Slides Limitations 2-88   Knee Flexion PROM   Knee/Hip Exercises: Prone   Hamstring Curl 10 reps   Hip Extension 10 reps   Contract/Relax to Increase Flexion x5                PT Education - 01/15/15 1036    Education provided Yes   Education Details Pt given HEP for hip excursions and LE stretches ([employer] handout)   Person(s) Educated Patient   Methods Explanation;Demonstration;Handout   Comprehension  Verbalized understanding;Returned demonstration          PT Short Term Goals - 01/16/15 1104    PT SHORT TERM GOAL #1   Title Patient will demonstrate no less than 7 degrees extension and at least 90 degrees flexion in pain free range    Time 2   Period Weeks   Status Partially Met   PT SHORT TERM GOAL #2   Title Patient will demonstrate at least 4/5 strength in bilateral lower extremities and all proximal muscle groups    Time 2   Period Weeks   Status Achieved   PT SHORT TERM GOAL #3   Title Patient will be able to ambulate at least 45 minutes over even and uneven surfaces with single point cane and R knee pain no more than 2/10   Time 2   Period Weeks   Status On-going   PT SHORT TERM GOAL #4   Title Patient will be able to perform functional sit to stand transfer without the use of her upper extremities and R knee pain no more than 2/10   Time 2   Period Weeks   Status On-going   PT SHORT TERM GOAL #5   Title Patient to demonstrate reduced levels of edema in her R knee and will be able to successfully self-perform appropriate massage technique to reduce edema and patellar mobilizations safely on an independent basis    Time 2   Period Weeks   Status On-going           PT Long Term Goals - 01/16/15 1105    PT LONG TERM GOAL #1   Title Patient will be independent in advanced HEP and will be able to perform her exercise routine correctly on a consistent basis    Time 4   Period Weeks   Status On-going   PT LONG TERM GOAL #2   Title Patient will demonstrate no more than 3 degrees R knee extension and will be able to perform R  knee flexion of at least 115 degrees    Time 4   Period Weeks   Status On-going   PT LONG TERM GOAL #3   Title Patient will be able to ambulate unlimited distances on level and unlevel surfaces with no assistive device, good balance, and minimal fall risk,  R knee pain 0/10   Time 4   Status On-going   PT LONG TERM GOAL #4   Title Patient  will be able to ascend and descend full flight of stairs with U railing, no assistive device, and reciprocal pattern free of compensation strategies    Time 4   Period Weeks   Status On-going   PT LONG TERM GOAL #5   Title Patient will be unlimited in her ability to perform functional tasks and chores around her home with R knee pain 0/10 and no assistive device, minimal rest breaks    Time 4   Period Weeks   Status On-going               Plan - 01/16/15 1304    Clinical Impression Statement Pt reassessed.  All mm strengths except for gluteal maximus are now wnl.  Pt ROM is continuing to progress, at 2- 88 degrees today for a total increase of 46 degrees of ROM, but end feel if very tight.  Faxed MD order for [facility]AS flexion brace.   Pt continues to have increased swelling which is limiting ROM.  Pt has only met 1/5 STG and 0/5 LTG recommend continue physical therapy for four more weeks.    PT Frequency 3x / week   PT Duration 8 weeks  4 additional weeks    PT Next Visit Plan Send prescrption and notes to [facility] rep once returned.  Pt treatment needs to be 5 minutes of glut work followed by working on flexion and edema technique no other strengthening should be done at this time.     e    Problem List Patient Active Problem List   Diagnosis Date Noted  . OA (osteoarthritis) of knee 12/13/2014   Rayetta Humphrey, PT CLT [phone] 01/16/2015, 1:10 PM  [facility] [address] [place], [place], [address] Phone: [phone]   Fax:  [phone]   Your signature is required to indicate approval of the treatment plan as stated above.  Please sign and return making a copy for your files.  You may hard copy or send electronically.  Please check one: ___1.  Approve of this plan  ___2.  Approve of this plan with the following changes.   ____________________________                             _____________ Physician                                                                       Date

## 2015-01-20 ENCOUNTER — Ambulatory Visit (HOSPITAL_COMMUNITY): Payer: Medicare Other | Admitting: Physical Therapy

## 2015-01-20 DIAGNOSIS — M25561 Pain in right knee: Secondary | ICD-10-CM

## 2015-01-20 DIAGNOSIS — M6281 Muscle weakness (generalized): Secondary | ICD-10-CM | POA: Diagnosis not present

## 2015-01-20 DIAGNOSIS — Z96651 Presence of right artificial knee joint: Secondary | ICD-10-CM | POA: Diagnosis not present

## 2015-01-20 DIAGNOSIS — M25461 Effusion, right knee: Secondary | ICD-10-CM | POA: Diagnosis not present

## 2015-01-20 DIAGNOSIS — Z471 Aftercare following joint replacement surgery: Secondary | ICD-10-CM | POA: Diagnosis not present

## 2015-01-20 DIAGNOSIS — M24661 Ankylosis, right knee: Secondary | ICD-10-CM

## 2015-01-20 DIAGNOSIS — R609 Edema, unspecified: Secondary | ICD-10-CM

## 2015-01-20 DIAGNOSIS — R531 Weakness: Secondary | ICD-10-CM

## 2015-01-20 DIAGNOSIS — M25661 Stiffness of right knee, not elsewhere classified: Secondary | ICD-10-CM | POA: Diagnosis not present

## 2015-01-20 NOTE — Therapy (Signed)
[place], [place], [address] Phone: [phone]   Fax:  [phone]  Physical Therapy Treatment  Patient Details  Name: [name] MRN: [id number] Date of Birth: May 02, 1941 Referring Provider:  Gaynelle Arabian, MD  Encounter Date: 01/20/2015      PT End of Session - 01/20/15 1254    Visit Number 13   Date for PT Re-Evaluation 02/15/15   Authorization Type Medicare    Authorization Time Period 12/23/14 to 02/22/15; G-code done 10th visit    Authorization - Visit Number 13   Authorization - Number of Visits 20   PT Start Time 2595   PT Stop Time 1059   PT Time Calculation (min) 44 min   Activity Tolerance Patient tolerated treatment well   Behavior During Therapy [facility] for tasks assessed/performed      Past Medical History  Diagnosis Date  . Hypertension   . Hypothyroidism   . Arthritis   . Hyperlipemia   . Wears glasses   . Complication of anesthesia     woke up too early from a surgery  . Bladder spasms     "spastic bladder" wears pads  . GERD (gastroesophageal reflux disease)     occ. frequent "hiccoughs" after "eating, indigestion"  . Trigger finger, left     alternate fingers affected. -Occ. shooting pain.  . Cancer     Skin cancer -scalp and eyebrow"basal cell"    Past Surgical History  Procedure Laterality Date  . Total hip arthroplasty  2006    right  . Total hip arthroplasty  2006    left  . Tonsillectomy    . Appendectomy    . Carpal tunnel release Left   . Thyroidectomy  1994  . Cardiac catheterization  2006    normal-done for work up pre hip surgeries  . Cholecystectomy  1997  . Abdominal hysterectomy  1989  . Polypectomy Bilateral 06/26/2013    Procedure: BILATERAL EXCISION OF NASAL POLYPS ;  Surgeon: Ascencion Dike, MD;  Location: [facility];  Service: ENT;  Laterality: Bilateral;  . Septoplasty N/A 06/26/2013    Procedure: AND SEPTOPLASTY;  Surgeon: Ascencion Dike,  MD;  Location: [facility];  Service: ENT;  Laterality: N/A;  . Joint replacement      BTHA  . Total knee arthroplasty Right 12/13/2014    Procedure: RIGHT TOTAL KNEE ARTHROPLASTY;  Surgeon: Gaynelle Arabian, MD;  Location: [facility];  Service: Orthopedics;  Laterality: Right;    There were no vitals filed for this visit.  Visit Diagnosis:  Status post total right knee replacement  Knee pain, right  Edema  Weakness generalized  Decreased range of motion of knee, right                       [facility] Adult PT Treatment/Exercise - 01/20/15 0001    Knee/Hip Exercises: Stretches   Active Hamstring Stretch 3 reps;30 seconds   Active Hamstring Stretch Limitations 14 inch box, 3 way    Quad Stretch 3 reps;30 seconds   Quad Stretch Limitations prone   Gastroc Stretch 3 reps;30 seconds   Gastroc Stretch Limitations slant board   Knee/Hip Exercises: Standing   Functional Squat 2 sets;10 reps   Functional Squat Limitations Mini squats neutral stance, cues for form    Wall Squat 1 set;10 reps   Wall Squat Limitations cues for form    Rocker Board Limitations x20  AP and x20 lateral, U HHA   Other Standing Knee Exercises 3D hip excursions 1x15   Knee/Hip Exercises: Supine   Bridges Both;2 sets;15 reps   Bridges Limitations standard bridge    Manual Therapy   Manual Therapy Soft tissue mobilization   Soft tissue mobilization myofascial techiques around the knee as well   Other Manual Therapy scar mobilization of Rt TKA                PT Education - 01/20/15 1254    Education provided No          PT Short Term Goals - 01/16/15 1104    PT SHORT TERM GOAL #1   Title Patient will demonstrate no less than 7 degrees extension and at least 90 degrees flexion in pain free range    Time 2   Period Weeks   Status Partially Met   PT SHORT TERM GOAL #2   Title Patient will demonstrate at least 4/5 strength in bilateral lower extremities and all proximal  muscle groups    Time 2   Period Weeks   Status Achieved   PT SHORT TERM GOAL #3   Title Patient will be able to ambulate at least 45 minutes over even and uneven surfaces with single point cane and R knee pain no more than 2/10   Time 2   Period Weeks   Status On-going   PT SHORT TERM GOAL #4   Title Patient will be able to perform functional sit to stand transfer without the use of her upper extremities and R knee pain no more than 2/10   Time 2   Period Weeks   Status On-going   PT SHORT TERM GOAL #5   Title Patient to demonstrate reduced levels of edema in her R knee and will be able to successfully self-perform appropriate massage technique to reduce edema and patellar mobilizations safely on an independent basis    Time 2   Period Weeks   Status On-going           PT Long Term Goals - 01/16/15 1105    PT LONG TERM GOAL #1   Title Patient will be independent in advanced HEP and will be able to perform her exercise routine correctly on a consistent basis    Time 4   Period Weeks   Status On-going   PT LONG TERM GOAL #2   Title Patient will demonstrate no more than 3 degrees R knee extension and will be able to perform R  knee flexion of at least 115 degrees    Time 4   Period Weeks   Status On-going   PT LONG TERM GOAL #3   Title Patient will be able to ambulate unlimited distances on level and unlevel surfaces with no assistive device, good balance, and minimal fall risk, R knee pain 0/10   Time 4   Status On-going   PT LONG TERM GOAL #4   Title Patient will be able to ascend and descend full flight of stairs with U railing, no assistive device, and reciprocal pattern free of compensation strategies    Time 4   Period Weeks   Status On-going   PT LONG TERM GOAL #5   Title Patient will be unlimited in her ability to perform functional tasks and chores around her home with R knee pain 0/10 and no assistive device, minimal rest breaks    Time 4   Period Weeks    Status  On-going               Plan - 01/20/15 1255    Clinical Impression Statement Continued functional stretching and exercise program with focus on gluts today, also continued manual techniques to knee to reduce edema. Patient tolerated session well today but states that her non-operated knee feels like it is limiting her during activities such as squats. Reduced pain at end of session.    Pt will benefit from skilled therapeutic intervention in order to improve on the following deficits Abnormal gait;Decreased coordination;Decreased range of motion;Difficulty walking;Impaired flexibility;Improper body mechanics;Decreased endurance;Postural dysfunction;Decreased activity tolerance;Decreased balance;Decreased knowledge of use of DME;Decreased scar mobility;Increased muscle spasms;Pain;Decreased mobility;Decreased strength   Rehab Potential Good   PT Frequency 3x / week   PT Duration 8 weeks  4 additional weeks    PT Treatment/Interventions ADLs/Self Care Home Management;Gait training;Neuromuscular re-education;Stair training;Functional mobility training;Patient/family education;Cryotherapy;Therapeutic activities;Therapeutic exercise;Manual techniques;DME Instruction;Balance training   PT Next Visit Plan Send prescrption and notes to [facility] rep once returned.  Pt treatment needs to be 5 minutes of glut work followed by working on flexion and edema technique no other strengthening should be done at this time.    Consulted and Agree with Plan of Care Patient        Problem List Patient Active Problem List   Diagnosis Date Noted  . OA (osteoarthritis) of knee 12/13/2014    Deniece Ree PT, DPT [facility] [address] [place], [place], [address] Phone: [phone]   Fax:  [phone]

## 2015-01-22 ENCOUNTER — Ambulatory Visit (HOSPITAL_COMMUNITY): Payer: Medicare Other

## 2015-01-22 DIAGNOSIS — M25561 Pain in right knee: Secondary | ICD-10-CM

## 2015-01-22 DIAGNOSIS — R609 Edema, unspecified: Secondary | ICD-10-CM

## 2015-01-22 DIAGNOSIS — Z96651 Presence of right artificial knee joint: Secondary | ICD-10-CM | POA: Diagnosis not present

## 2015-01-22 DIAGNOSIS — M25461 Effusion, right knee: Secondary | ICD-10-CM | POA: Diagnosis not present

## 2015-01-22 DIAGNOSIS — R531 Weakness: Secondary | ICD-10-CM

## 2015-01-22 DIAGNOSIS — M24661 Ankylosis, right knee: Secondary | ICD-10-CM

## 2015-01-22 DIAGNOSIS — M25661 Stiffness of right knee, not elsewhere classified: Secondary | ICD-10-CM | POA: Diagnosis not present

## 2015-01-22 DIAGNOSIS — Z471 Aftercare following joint replacement surgery: Secondary | ICD-10-CM | POA: Diagnosis not present

## 2015-01-22 DIAGNOSIS — M6281 Muscle weakness (generalized): Secondary | ICD-10-CM | POA: Diagnosis not present

## 2015-01-22 NOTE — Therapy (Signed)
[place], [place], [address] Phone: [phone]   Fax:  [phone]  Physical Therapy Treatment  Patient Details  Name: [name] MRN: [id number] Date of Birth: 1941/07/14 Referring Provider:  Gaynelle Arabian, MD  Encounter Date: 01/22/2015      PT End of Session - 01/22/15 1106    Visit Number 15   Number of Visits 24   Date for PT Re-Evaluation 02/15/15   Authorization Type Medicare    Authorization Time Period 12/23/14 to 02/22/15; G-code done 10th visit    Authorization - Visit Number 15   Authorization - Number of Visits 20   PT Start Time 1025   PT Stop Time 1105   PT Time Calculation (min) 40 min   Activity Tolerance Patient tolerated treatment well   Behavior During Therapy [facility] for tasks assessed/performed      Past Medical History  Diagnosis Date  . Hypertension   . Hypothyroidism   . Arthritis   . Hyperlipemia   . Wears glasses   . Complication of anesthesia     woke up too early from a surgery  . Bladder spasms     "spastic bladder" wears pads  . GERD (gastroesophageal reflux disease)     occ. frequent "hiccoughs" after "eating, indigestion"  . Trigger finger, left     alternate fingers affected. -Occ. shooting pain.  . Cancer     Skin cancer -scalp and eyebrow"basal cell"    Past Surgical History  Procedure Laterality Date  . Total hip arthroplasty  2006    right  . Total hip arthroplasty  2006    left  . Tonsillectomy    . Appendectomy    . Carpal tunnel release Left   . Thyroidectomy  1994  . Cardiac catheterization  2006    normal-done for work up pre hip surgeries  . Cholecystectomy  1997  . Abdominal hysterectomy  1989  . Polypectomy Bilateral 06/26/2013    Procedure: BILATERAL EXCISION OF NASAL POLYPS ;  Surgeon: Ascencion Dike, MD;  Location: [facility];  Service: ENT;  Laterality: Bilateral;  . Septoplasty N/A 06/26/2013    Procedure: AND  SEPTOPLASTY;  Surgeon: Ascencion Dike, MD;  Location: [facility];  Service: ENT;  Laterality: N/A;  . Joint replacement      BTHA  . Total knee arthroplasty Right 12/13/2014    Procedure: RIGHT TOTAL KNEE ARTHROPLASTY;  Surgeon: Gaynelle Arabian, MD;  [facility];  Service: Orthopedics;  Laterality: Right;    There were no vitals filed for this visit.  Visit Diagnosis:  Status post total right knee replacement  Knee pain, right  Edema  Weakness generalized  Decreased range of motion of knee, right      Subjective Assessment - 01/22/15 1027    Subjective Pt late for apt today.  Pt stated she had increased knee pain last night, did not take any pain medications yesterday because she drove to post office for first time   Currently in Pain? Yes   Pain Score 2    Pain Location Knee   Pain Orientation Right   Pain Descriptors / Indicators Aching                [facility] Adult PT Treatment/Exercise - 01/22/15 0001    Knee/Hip Exercises: Stretches   Active Hamstring Stretch 3 reps;30 seconds   Active Hamstring Stretch Limitations 14 inch box, 3 way  Quad Stretch 3 reps;30 seconds   Quad Stretch Limitations prone   Knee: Self-Stretch Limitations knee drivers  8" box 10 reps   Gastroc Stretch 3 reps;30 seconds   Gastroc Stretch Limitations slant board   Knee/Hip Exercises: Standing   Functional Squat 2 sets;10 reps   Functional Squat Limitations Mini squats neutral stance, cues for form    Wall Squat 1 set;10 reps   Wall Squat Limitations cues for form    Other Standing Knee Exercises 3D hip excursions 1x15   Knee/Hip Exercises: Prone   Hip Extension 10 reps   Contract/Relax to Increase Flexion x5   Manual Therapy   Manual Therapy Soft tissue mobilization   Soft tissue mobilization myofascial techiques incision and quad with  LE evalated   Other Manual Therapy scar mobilization of Rt TKA                  PT Short Term Goals - 01/22/15 1203     PT SHORT TERM GOAL #1   Title Patient will demonstrate no less than 7 degrees extension and at least 90 degrees flexion in pain free range    Status On-going   PT SHORT TERM GOAL #2   Title Patient will demonstrate at least 4/5 strength in bilateral lower extremities and all proximal muscle groups    Status Achieved   PT SHORT TERM GOAL #3   Title Patient will be able to ambulate at least 45 minutes over even and uneven surfaces with single point cane and R knee pain no more than 2/10   Status On-going   PT SHORT TERM GOAL #4   Title Patient will be able to perform functional sit to stand transfer without the use of her upper extremities and R knee pain no more than 2/10   Status On-going   PT SHORT TERM GOAL #5   Title Patient to demonstrate reduced levels of edema in her R knee and will be able to successfully self-perform appropriate massage technique to reduce edema and patellar mobilizations safely on an independent basis    Status On-going           PT Long Term Goals - 01/22/15 1203    PT LONG TERM GOAL #1   Title Patient will be independent in advanced HEP and will be able to perform her exercise routine correctly on a consistent basis    Status On-going   PT LONG TERM GOAL #2   Title Patient will demonstrate no more than 3 degrees R knee extension and will be able to perform R  knee flexion of at least 115 degrees    Status On-going   PT LONG TERM GOAL #3   Title Patient will be able to ambulate unlimited distances on level and unlevel surfaces with no assistive device, good balance, and minimal fall risk, R knee pain 0/10   PT LONG TERM GOAL #4   Title Patient will be able to ascend and descend full flight of stairs with U railing, no assistive device, and reciprocal pattern free of compensation strategies    Status On-going   PT LONG TERM GOAL #5   Title Patient will be unlimited in her ability to perform functional tasks and chores around her home with R knee pain 0/10  and no assistive device, minimal rest breaks                Plan - 01/22/15 1038    Clinical Impression Statement Signed referral for JAS  brace not receieved.  Session focus on improving gait mechanics for increased ROM and gluteal strengthening.  Therapist facilitation required to improve appropraite weight loading and overall form with standing exercises.  Manual soft tissue mobilization and MFR complete to anterior knee to reduce adhesions along scar and to reduce spasms on quad as well as edema control.     PT Next Visit Plan Send prescrption and notes to [facility] rep once returned.  Continue session focus on improving AROM        Problem List Patient Active Problem List   Diagnosis Date Noted  . OA (osteoarthritis) of knee 12/13/2014   Ihor Austin, [place]; [place] [facility] [phone]  Aldona Lento 01/22/2015, 12:06 PM  [facility] [address] [place], [place], [address] Phone: [phone]   Fax:  [phone]

## 2015-01-24 ENCOUNTER — Ambulatory Visit (HOSPITAL_COMMUNITY): Payer: Medicare Other | Admitting: Physical Therapy

## 2015-01-24 DIAGNOSIS — M25561 Pain in right knee: Secondary | ICD-10-CM | POA: Diagnosis not present

## 2015-01-24 DIAGNOSIS — M25661 Stiffness of right knee, not elsewhere classified: Secondary | ICD-10-CM | POA: Diagnosis not present

## 2015-01-24 DIAGNOSIS — M25461 Effusion, right knee: Secondary | ICD-10-CM | POA: Diagnosis not present

## 2015-01-24 DIAGNOSIS — Z471 Aftercare following joint replacement surgery: Secondary | ICD-10-CM | POA: Diagnosis not present

## 2015-01-24 DIAGNOSIS — R531 Weakness: Secondary | ICD-10-CM

## 2015-01-24 DIAGNOSIS — M24661 Ankylosis, right knee: Secondary | ICD-10-CM

## 2015-01-24 DIAGNOSIS — Z96651 Presence of right artificial knee joint: Secondary | ICD-10-CM | POA: Diagnosis not present

## 2015-01-24 DIAGNOSIS — R609 Edema, unspecified: Secondary | ICD-10-CM

## 2015-01-24 DIAGNOSIS — M6281 Muscle weakness (generalized): Secondary | ICD-10-CM | POA: Diagnosis not present

## 2015-01-24 NOTE — Therapy (Signed)
[place], [place], [address] Phone: [phone]   Fax:  [phone]  Physical Therapy Treatment  Patient Details  Name: [name] MRN: [id number] Date of Birth: February 12, 1941 Referring Provider:  Gaynelle Arabian, MD  Encounter Date: 01/24/2015      PT End of Session - 01/24/15 1513    Visit Number 16   Number of Visits 24   Date for PT Re-Evaluation 02/15/15   Authorization Type Medicare    Authorization Time Period 12/23/14 to 02/22/15; G-code done 10th visit    Authorization - Visit Number 16   Authorization - Number of Visits 20   PT Start Time 1018   PT Stop Time 1102   PT Time Calculation (min) 44 min   Activity Tolerance Patient tolerated treatment well   Behavior During Therapy [facility] for tasks assessed/performed      Past Medical History  Diagnosis Date  . Hypertension   . Hypothyroidism   . Arthritis   . Hyperlipemia   . Wears glasses   . Complication of anesthesia     woke up too early from a surgery  . Bladder spasms     "spastic bladder" wears pads  . GERD (gastroesophageal reflux disease)     occ. frequent "hiccoughs" after "eating, indigestion"  . Trigger finger, left     alternate fingers affected. -Occ. shooting pain.  . Cancer     Skin cancer -scalp and eyebrow"basal cell"    Past Surgical History  Procedure Laterality Date  . Total hip arthroplasty  2006    right  . Total hip arthroplasty  2006    left  . Tonsillectomy    . Appendectomy    . Carpal tunnel release Left   . Thyroidectomy  1994  . Cardiac catheterization  2006    normal-done for work up pre hip surgeries  . Cholecystectomy  1997  . Abdominal hysterectomy  1989  . Polypectomy Bilateral 06/26/2013    Procedure: BILATERAL EXCISION OF NASAL POLYPS ;  Surgeon: Ascencion Dike, MD;  Location: [place];  Service: ENT;  Laterality: Bilateral;  . Septoplasty N/A 06/26/2013    Procedure: AND  SEPTOPLASTY;  Surgeon: Ascencion Dike, MD;  Location: [place];  Service: ENT;  Laterality: N/A;  . Joint replacement      BTHA  . Total knee arthroplasty Right 12/13/2014    Procedure: RIGHT TOTAL KNEE ARTHROPLASTY;  Surgeon: Gaynelle Arabian, MD;  [facility];  Service: Orthopedics;  Laterality: Right;    There were no vitals filed for this visit.  Visit Diagnosis:  Status post total right knee replacement  Knee pain, right  Edema  Weakness generalized  Decreased range of motion of knee, right      Subjective Assessment - 01/24/15 1510    Subjective Patiient arrived to PT on time, very pleasant; has taken pain medicine and a muscle relaxer this morning.    Pertinent History Knee replacement surgery was done on 12/13/14; had seen multiple orthopedic specialists. Had both hips replaced around 2006. Has known for at least 4 years that both knees are bone on bone; finally got knee replacement scheduled.  Went to [facility] after the surgery.   Currently in Pain? Yes   Pain Score 3    Pain Location Knee   Pain Orientation Right  [facility] Adult PT Treatment/Exercise - 01/24/15 0001    Knee/Hip Exercises: Stretches   Active Hamstring Stretch 3 reps;30 seconds   Active Hamstring Stretch Limitations 14 inch box    Quad Stretch 3 reps;30 seconds   Quad Stretch Limitations prone   Knee: Self-Stretch Limitations knee drivers  8" box 10 reps   Gastroc Stretch 3 reps;30 seconds   Gastroc Stretch Limitations slant board   Knee/Hip Exercises: Standing   Functional Squat 2 sets;10 reps   Functional Squat Limitations Mini squats neutral stance, cues for form   cues to reduce valgus moment during squats    Other Standing Knee Exercises 3D hip excursions 1x15   Manual Therapy   Manual Therapy Edema management   Soft tissue mobilization massage to reduce edema in knee    Other Manual Therapy scar mobilization                 PT  Education - 01/24/15 1513    Education provided Yes   Education Details education for appropriate scar mobilization technique at home    Person(s) Educated Patient   Methods Explanation   Comprehension Verbalized understanding          PT Short Term Goals - 01/22/15 1203    PT SHORT TERM GOAL #1   Title Patient will demonstrate no less than 7 degrees extension and at least 90 degrees flexion in pain free range    Status On-going   PT SHORT TERM GOAL #2   Title Patient will demonstrate at least 4/5 strength in bilateral lower extremities and all proximal muscle groups    Status Achieved   PT SHORT TERM GOAL #3   Title Patient will be able to ambulate at least 45 minutes over even and uneven surfaces with single point cane and R knee pain no more than 2/10   Status On-going   PT SHORT TERM GOAL #4   Title Patient will be able to perform functional sit to stand transfer without the use of her upper extremities and R knee pain no more than 2/10   Status On-going   PT SHORT TERM GOAL #5   Title Patient to demonstrate reduced levels of edema in her R knee and will be able to successfully self-perform appropriate massage technique to reduce edema and patellar mobilizations safely on an independent basis    Status On-going           PT Long Term Goals - 01/22/15 1203    PT LONG TERM GOAL #1   Title Patient will be independent in advanced HEP and will be able to perform her exercise routine correctly on a consistent basis    Status On-going   PT LONG TERM GOAL #2   Title Patient will demonstrate no more than 3 degrees R knee extension and will be able to perform R  knee flexion of at least 115 degrees    Status On-going   PT LONG TERM GOAL #3   Title Patient will be able to ambulate unlimited distances on level and unlevel surfaces with no assistive device, good balance, and minimal fall risk, R knee pain 0/10   PT LONG TERM GOAL #4   Title Patient will be able to ascend and descend  full flight of stairs with U railing, no assistive device, and reciprocal pattern free of compensation strategies    Status On-going   PT LONG TERM GOAL #5   Title Patient will be unlimited in her ability to perform  functional tasks and chores around her home with R knee pain 0/10 and no assistive device, minimal rest breaks                Plan - 01/24/15 1514    Clinical Impression Statement Continued functional exercises and stretching as well as manual to knee to assist in reducing pain and improving motion today. Patient very pleasant today, states that she had not been working her scar and was educated to start doing so at home. Continues to require intermittent cues for form during exercises.    Pt will benefit from skilled therapeutic intervention in order to improve on the following deficits Abnormal gait;Decreased coordination;Decreased range of motion;Difficulty walking;Impaired flexibility;Improper body mechanics;Decreased endurance;Postural dysfunction;Decreased activity tolerance;Decreased balance;Decreased knowledge of use of DME;Decreased scar mobility;Increased muscle spasms;Pain;Decreased mobility;Decreased strength   Rehab Potential Good   PT Frequency 3x / week   PT Duration 8 weeks   PT Treatment/Interventions ADLs/Self Care Home Management;Gait training;Neuromuscular re-education;Stair training;Functional mobility training;Patient/family education;Cryotherapy;Therapeutic activities;Therapeutic exercise;Manual techniques;DME Instruction;Balance training   PT Next Visit Plan Send prescrption and notes to [facility] rep once returned.  Continue session focus on improving AROM   PT Home Exercise Plan Quick re-eval next visit for return to MD on Monday.  Continue to focus on improving ROM   Consulted and Agree with Plan of Care Patient        Problem List Patient Active Problem List   Diagnosis Date Noted  . OA (osteoarthritis) of knee 12/13/2014    Deniece Ree PT,  DPT [facility] [address] [place], [place], [address] Phone: [phone]   Fax:  [phone]

## 2015-01-27 ENCOUNTER — Ambulatory Visit (HOSPITAL_COMMUNITY): Payer: Medicare Other

## 2015-01-27 DIAGNOSIS — Z96651 Presence of right artificial knee joint: Secondary | ICD-10-CM

## 2015-01-27 DIAGNOSIS — M25561 Pain in right knee: Secondary | ICD-10-CM

## 2015-01-27 DIAGNOSIS — R609 Edema, unspecified: Secondary | ICD-10-CM

## 2015-01-27 DIAGNOSIS — R531 Weakness: Secondary | ICD-10-CM

## 2015-01-27 DIAGNOSIS — M24661 Ankylosis, right knee: Secondary | ICD-10-CM

## 2015-01-27 NOTE — Therapy (Signed)
[place], [place], [address] Phone: [phone]   Fax:  [phone]  Physical Therapy Treatment  Patient Details  Name: [name] MRN: [id number] Date of Birth: 1940/12/03 Referring Provider:  Gaynelle Arabian, MD  Encounter Date: 01/27/2015      PT End of Session - 01/27/15 1025    Visit Number 17   Number of Visits 24   Date for PT Re-Evaluation 02/15/15   Authorization Type Medicare    Authorization Time Period 12/23/14 to 02/22/15; G-code done 10th visit    Authorization - Visit Number 17   Authorization - Number of Visits 20   PT Start Time 1022   PT Stop Time 1105   PT Time Calculation (min) 43 min   Activity Tolerance Patient tolerated treatment well   Behavior During Therapy [facility] for tasks assessed/performed      Past Medical History  Diagnosis Date  . Hypertension   . Hypothyroidism   . Arthritis   . Hyperlipemia   . Wears glasses   . Complication of anesthesia     woke up too early from a surgery  . Bladder spasms     "spastic bladder" wears pads  . GERD (gastroesophageal reflux disease)     occ. frequent "hiccoughs" after "eating, indigestion"  . Trigger finger, left     alternate fingers affected. -Occ. shooting pain.  . Cancer     Skin cancer -scalp and eyebrow"basal cell"    Past Surgical History  Procedure Laterality Date  . Total hip arthroplasty  2006    right  . Total hip arthroplasty  2006    left  . Tonsillectomy    . Appendectomy    . Carpal tunnel release Left   . Thyroidectomy  1994  . Cardiac catheterization  2006    normal-done for work up pre hip surgeries  . Cholecystectomy  1997  . Abdominal hysterectomy  1989  . Polypectomy Bilateral 06/26/2013    Procedure: BILATERAL EXCISION OF NASAL POLYPS ;  Surgeon: Ascencion Dike, MD;  Location: [place];  Service: ENT;  Laterality: Bilateral;  . Septoplasty N/A 06/26/2013    Procedure: AND  SEPTOPLASTY;  Surgeon: Ascencion Dike, MD;  Location: [place];  Service: ENT;  Laterality: N/A;  . Joint replacement      BTHA  . Total knee arthroplasty Right 12/13/2014    Procedure: RIGHT TOTAL KNEE ARTHROPLASTY;  Surgeon: Gaynelle Arabian, MD;  [facility];  Service: Orthopedics;  Laterality: Right;    There were no vitals filed for this visit.  Visit Diagnosis:  Status post total right knee replacement  Knee pain, right  Edema  Weakness generalized  Decreased range of motion of knee, right      Subjective Assessment - 01/27/15 1024    Subjective Pt stated knee is very tight today, no reports of pain    Currently in Pain? No/denies            [facility] PT Assessment - 01/27/15 0001    Assessment   Medical Diagnosis R total knee replacement    Onset Date/Surgical Date 12/13/14   Next MD Visit [place] 02/11/2015               [facility] Adult PT Treatment/Exercise - 01/27/15 0001    Exercises   Exercises Knee/Hip   Knee/Hip Exercises: Stretches   Active Hamstring Stretch 3 reps;30 seconds   Active Hamstring Stretch Limitations  14 inch box    Quad Stretch 3 reps;30 seconds   Quad Stretch Limitations prone   Knee: Self-Stretch Limitations knee drivers  8" box 10 reps   Gastroc Stretch 3 reps;30 seconds   Gastroc Stretch Limitations slant board   Knee/Hip Exercises: Land 2 minutes   Rocker Board Limitations R/L and A/P   Other Standing Knee Exercises 3D hip excursions split stance 10x each   Knee/Hip Exercises: Supine   Knee Flexion PROM   Knee/Hip Exercises: Prone   Hip Extension 10 reps   Contract/Relax to Increase Flexion x5   Manual Therapy   Manual Therapy Myofascial release;Soft tissue mobilization   Soft tissue mobilization massage to reduce edema in knee    Other Manual Therapy scar mobilization            PT Short Term Goals - 01/27/15 1033    PT SHORT TERM GOAL #1   Title Patient will demonstrate no less than 7  degrees extension and at least 90 degrees flexion in pain free range    Status On-going   PT SHORT TERM GOAL #2   Title Patient will demonstrate at least 4/5 strength in bilateral lower extremities and all proximal muscle groups    Status Achieved   PT SHORT TERM GOAL #3   Title Patient will be able to ambulate at least 45 minutes over even and uneven surfaces with single point cane and R knee pain no more than 2/10   Status On-going   PT SHORT TERM GOAL #4   Title Patient will be able to perform functional sit to stand transfer without the use of her upper extremities and R knee pain no more than 2/10   Status On-going   PT SHORT TERM GOAL #5   Title Patient to demonstrate reduced levels of edema in her R knee and will be able to successfully self-perform appropriate massage technique to reduce edema and patellar mobilizations safely on an independent basis    Status On-going           PT Long Term Goals - 01/27/15 1034    PT LONG TERM GOAL #1   Title Patient will be independent in advanced HEP and will be able to perform her exercise routine correctly on a consistent basis    Status On-going   PT LONG TERM GOAL #2   Title Patient will demonstrate no more than 3 degrees R knee extension and will be able to perform R  knee flexion of at least 115 degrees    Status On-going   PT LONG TERM GOAL #3   Title Patient will be able to ambulate unlimited distances on level and unlevel surfaces with no assistive device, good balance, and minimal fall risk, R knee pain 0/10   Status On-going   PT LONG TERM GOAL #4   Title Patient will be able to ascend and descend full flight of stairs with U railing, no assistive device, and reciprocal pattern free of compensation strategies    Status On-going   PT LONG TERM GOAL #5   Title Patient will be unlimited in her ability to perform functional tasks and chores around her home with R knee pain 0/10 and no assistive device, minimal rest breaks     Status On-going               Plan - 01/27/15 1030    Clinical Impression Statement Session focus on improving AROM and mobility exercises to  reduce tightness.  Intermittent cueing for correct form with exercises.  Manual techniques complete to improve ROM with MFR, PROM and edema control.  [facility] referral not received, refaxed   PT Next Visit Plan Call MD office if referral not received.  Send prescrption and notes to [facility] rep once referral signed and returned.  Continue session focus on improving AROM        Problem List Patient Active Problem List   Diagnosis Date Noted  . OA (osteoarthritis) of knee 12/13/2014   Ihor Austin, [place]; [place] #[id number] [phone]  Aldona Lento 01/27/2015, 11:26 AM  [place], [place], [address] Phone: [phone]   Fax:  [phone]

## 2015-01-29 ENCOUNTER — Ambulatory Visit (HOSPITAL_COMMUNITY): Payer: Medicare Other | Admitting: Physical Therapy

## 2015-01-29 DIAGNOSIS — M25561 Pain in right knee: Secondary | ICD-10-CM | POA: Diagnosis not present

## 2015-01-29 DIAGNOSIS — Z96651 Presence of right artificial knee joint: Secondary | ICD-10-CM | POA: Diagnosis not present

## 2015-01-29 DIAGNOSIS — M6281 Muscle weakness (generalized): Secondary | ICD-10-CM | POA: Diagnosis not present

## 2015-01-29 DIAGNOSIS — Z471 Aftercare following joint replacement surgery: Secondary | ICD-10-CM | POA: Diagnosis not present

## 2015-01-29 DIAGNOSIS — M24661 Ankylosis, right knee: Secondary | ICD-10-CM

## 2015-01-29 DIAGNOSIS — R531 Weakness: Secondary | ICD-10-CM

## 2015-01-29 DIAGNOSIS — R609 Edema, unspecified: Secondary | ICD-10-CM

## 2015-01-29 DIAGNOSIS — M25461 Effusion, right knee: Secondary | ICD-10-CM | POA: Diagnosis not present

## 2015-01-29 DIAGNOSIS — M25661 Stiffness of right knee, not elsewhere classified: Secondary | ICD-10-CM | POA: Diagnosis not present

## 2015-01-29 NOTE — Therapy (Signed)
[place], [place], [address] Phone: [phone]   Fax:  [phone]  Physical Therapy Treatment  Patient Details  Name: [name] MRN: [id number] Date of Birth: Mar 26, 1941 Referring Provider:  Sharilyn Sites, MD  Encounter Date: 01/29/2015      PT End of Session - 01/29/15 1108    Visit Number 18   Number of Visits 24   Date for PT Re-Evaluation 02/15/15   Authorization Type Medicare    Authorization Time Period 12/23/14 to 02/22/15; G-code done 10th visit    Authorization - Visit Number 18   Authorization - Number of Visits 20   PT Start Time 1020   PT Stop Time 1108   PT Time Calculation (min) 48 min   Activity Tolerance Patient tolerated treatment well   Behavior During Therapy [facility] for tasks assessed/performed      Past Medical History  Diagnosis Date  . Hypertension   . Hypothyroidism   . Arthritis   . Hyperlipemia   . Wears glasses   . Complication of anesthesia     woke up too early from a surgery  . Bladder spasms     "spastic bladder" wears pads  . GERD (gastroesophageal reflux disease)     occ. frequent "hiccoughs" after "eating, indigestion"  . Trigger finger, left     alternate fingers affected. -Occ. shooting pain.  . Cancer     Skin cancer -scalp and eyebrow"basal cell"    Past Surgical History  Procedure Laterality Date  . Total hip arthroplasty  2006    right  . Total hip arthroplasty  2006    left  . Tonsillectomy    . Appendectomy    . Carpal tunnel release Left   . Thyroidectomy  1994  . Cardiac catheterization  2006    normal-done for work up pre hip surgeries  . Cholecystectomy  1997  . Abdominal hysterectomy  1989  . Polypectomy Bilateral 06/26/2013    Procedure: BILATERAL EXCISION OF NASAL POLYPS ;  Surgeon: Ascencion Dike, MD;  Location: [place];  Service: ENT;  Laterality: Bilateral;  . Septoplasty N/A 06/26/2013    Procedure: AND SEPTOPLASTY;   Surgeon: Ascencion Dike, MD;  Location: [place];  Service: ENT;  Laterality: N/A;  . Joint replacement      BTHA  . Total knee arthroplasty Right 12/13/2014    Procedure: RIGHT TOTAL KNEE ARTHROPLASTY;  Surgeon: Gaynelle Arabian, MD;  [facility];  Service: Orthopedics;  Laterality: Right;    There were no vitals filed for this visit.  Visit Diagnosis:  Status post total right knee replacement  Knee pain, right  Edema  Weakness generalized  Decreased range of motion of knee, right      Subjective Assessment - 01/29/15 1022    Subjective Pt reports stable symptoms in knee, pain free at visit, a little painful last night, but otherwise unremarkable.    Pertinent History Knee replacement surgery was done on 12/13/14; had seen multiple orthopedic specialists. Had both hips replaced around 2006. Has known for at least 4 years that both knees are bone on bone; finally got knee replacement scheduled.  Went to [facility] after the surgery.                         [facility] Adult PT Treatment/Exercise - 01/29/15 0001    Knee/Hip Exercises: Stretches   Active Hamstring  Stretch 3 reps;30 seconds   Active Hamstring Stretch Limitations 14" box, bilat    Quad Stretch 30 seconds;5 reps   Quad Stretch Limitations prone with 5s MET    Knee: Self-Stretch Limitations knee drivers  8" box 10 reps   Gastroc Stretch 3 reps;30 seconds   Gastroc Stretch Limitations slant board   Knee/Hip Exercises: Standing   Functional Squat 2 sets;5 reps   Functional Squat Limitations STS hands free with [employer] on chair.    Rocker Board 2 minutes  61mn a/p; 264m lateral   Rocker Board Limitations R/L and A/P   Other Standing Knee Exercises 3D hip excursions split stance 10x each  10x a/p, later, transverse                  PT Short Term Goals - 01/27/15 1033    PT SHORT TERM GOAL #1   Title Patient will demonstrate no less than 7 degrees extension and at least 90 degrees  flexion in pain free range    Status On-going   PT SHORT TERM GOAL #2   Title Patient will demonstrate at least 4/5 strength in bilateral lower extremities and all proximal muscle groups    Status Achieved   PT SHORT TERM GOAL #3   Title Patient will be able to ambulate at least 45 minutes over even and uneven surfaces with single point cane and R knee pain no more than 2/10   Status On-going   PT SHORT TERM GOAL #4   Title Patient will be able to perform functional sit to stand transfer without the use of her upper extremities and R knee pain no more than 2/10   Status On-going   PT SHORT TERM GOAL #5   Title Patient to demonstrate reduced levels of edema in her R knee and will be able to successfully self-perform appropriate massage technique to reduce edema and patellar mobilizations safely on an independent basis    Status On-going           PT Long Term Goals - 01/27/15 1034    PT LONG TERM GOAL #1   Title Patient will be independent in advanced HEP and will be able to perform her exercise routine correctly on a consistent basis    Status On-going   PT LONG TERM GOAL #2   Title Patient will demonstrate no more than 3 degrees R knee extension and will be able to perform R  knee flexion of at least 115 degrees    Status On-going   PT LONG TERM GOAL #3   Title Patient will be able to ambulate unlimited distances on level and unlevel surfaces with no assistive device, good balance, and minimal fall risk, R knee pain 0/10   Status On-going   PT LONG TERM GOAL #4   Title Patient will be able to ascend and descend full flight of stairs with U railing, no assistive device, and reciprocal pattern free of compensation strategies    Status On-going   PT LONG TERM GOAL #5   Title Patient will be unlimited in her ability to perform functional tasks and chores around her home with R knee pain 0/10 and no assistive device, minimal rest breaks    Status On-going               Plan  - 01/29/15 1108    Clinical Impression Statement (p) Pt is making progress toward goals as evidenced by improved strenth with therex, and improved balance with  rocker board activity. Pt should continue to focus on improving ROM, pain, and swelling complaints. Pt will continue to benefit from skilled intervention to progress goals.    Pt will benefit from skilled therapeutic intervention in order to improve on the following deficits (p) Abnormal gait;Decreased coordination;Decreased range of motion;Difficulty walking;Impaired flexibility;Improper body mechanics;Decreased endurance;Postural dysfunction;Decreased activity tolerance;Decreased balance;Decreased knowledge of use of DME;Decreased scar mobility;Increased muscle spasms;Pain;Decreased mobility;Decreased strength   Rehab Potential (p) Good        Problem List Patient Active Problem List   Diagnosis Date Noted  . OA (osteoarthritis) of knee 12/13/2014   Rebbeca Paul, PT, DPT [id number]  01/29/2015 3:36 PM   Deniece Ree PT, DPT [facility] [address] [place], [place], [address] Phone: [phone]   Fax:  [phone]

## 2015-01-30 DIAGNOSIS — R350 Frequency of micturition: Secondary | ICD-10-CM | POA: Diagnosis not present

## 2015-01-30 DIAGNOSIS — N3946 Mixed incontinence: Secondary | ICD-10-CM | POA: Diagnosis not present

## 2015-01-31 ENCOUNTER — Ambulatory Visit (HOSPITAL_COMMUNITY): Payer: Medicare Other | Admitting: Physical Therapy

## 2015-01-31 DIAGNOSIS — R609 Edema, unspecified: Secondary | ICD-10-CM

## 2015-01-31 DIAGNOSIS — R531 Weakness: Secondary | ICD-10-CM

## 2015-01-31 DIAGNOSIS — M25661 Stiffness of right knee, not elsewhere classified: Secondary | ICD-10-CM | POA: Diagnosis not present

## 2015-01-31 DIAGNOSIS — Z96651 Presence of right artificial knee joint: Secondary | ICD-10-CM | POA: Diagnosis not present

## 2015-01-31 DIAGNOSIS — M24661 Ankylosis, right knee: Secondary | ICD-10-CM

## 2015-01-31 DIAGNOSIS — Z471 Aftercare following joint replacement surgery: Secondary | ICD-10-CM | POA: Diagnosis not present

## 2015-01-31 DIAGNOSIS — M25461 Effusion, right knee: Secondary | ICD-10-CM | POA: Diagnosis not present

## 2015-01-31 DIAGNOSIS — M25561 Pain in right knee: Secondary | ICD-10-CM | POA: Diagnosis not present

## 2015-01-31 DIAGNOSIS — M6281 Muscle weakness (generalized): Secondary | ICD-10-CM | POA: Diagnosis not present

## 2015-01-31 NOTE — Therapy (Signed)
[place], [place], [address] Phone: [phone]   Fax:  [phone]  Physical Therapy Treatment  Patient Details  Name: [name] MRN: [id number] Date of Birth: [birth year] Referring Provider:  Gaynelle Arabian, MD  Encounter Date: 01/31/2015      PT End of Session - 01/31/15 1130    Visit Number 19   Number of Visits 24   Date for PT Re-Evaluation 02/15/15   Authorization Type Medicare    Authorization Time Period 12/23/14 to 02/22/15; G-code done 10th visit    Authorization - Visit Number 19   Authorization - Number of Visits 20   PT Start Time 1016   PT Stop Time 1057   PT Time Calculation (min) 41 min   Activity Tolerance Patient tolerated treatment well   Behavior During Therapy [facility] for tasks assessed/performed      Past Medical History  Diagnosis Date  . Hypertension   . Hypothyroidism   . Arthritis   . Hyperlipemia   . Wears glasses   . Complication of anesthesia     woke up too early from a surgery  . Bladder spasms     "spastic bladder" wears pads  . GERD (gastroesophageal reflux disease)     occ. frequent "hiccoughs" after "eating, indigestion"  . Trigger finger, left     alternate fingers affected. -Occ. shooting pain.  . Cancer     Skin cancer -scalp and eyebrow"basal cell"    Past Surgical History  Procedure Laterality Date  . Total hip arthroplasty  2006    right  . Total hip arthroplasty  2006    left  . Tonsillectomy    . Appendectomy    . Carpal tunnel release Left   . Thyroidectomy  1994  . Cardiac catheterization  2006    normal-done for work up pre hip surgeries  . Cholecystectomy  1997  . Abdominal hysterectomy  1989  . Polypectomy Bilateral 06/26/2013    Procedure: BILATERAL EXCISION OF NASAL POLYPS ;  Surgeon: Ascencion Dike, MD;  Location: [place];  Service: ENT;  Laterality: Bilateral;  . Septoplasty N/A 06/26/2013    Procedure: AND  SEPTOPLASTY;  Surgeon: Ascencion Dike, MD;  Location: [place];  Service: ENT;  Laterality: N/A;  . Joint replacement      BTHA  . Total knee arthroplasty Right 12/13/2014    Procedure: RIGHT TOTAL KNEE ARTHROPLASTY;  Surgeon: Gaynelle Arabian, MD;  Location: [facility];  Service: Orthopedics;  Laterality: Right;    There were no vitals filed for this visit.  Visit Diagnosis:  Status post total right knee replacement  Knee pain, right  Edema  Weakness generalized  Decreased range of motion of knee, right      Subjective Assessment - 01/31/15 1018    Subjective Patient arrived to session without her assistive device, reports that she went to her doctor the other day also without assistive device. She reports that she took her pain medicine around 9am this morning and is pain-free right now.    Pertinent History Knee replacement surgery was done on 12/13/14; had seen multiple orthopedic specialists. Had both hips replaced around 2006. Has known for at least 4 years that both knees are bone on bone; finally got knee replacement scheduled.  Went to [facility] after the surgery.   Currently in Pain? No/denies  [facility] Adult PT Treatment/Exercise - 01/31/15 0001    Knee/Hip Exercises: Stretches   Active Hamstring Stretch 3 reps;30 seconds   Active Hamstring Stretch Limitations 14 inch box    Quad Stretch 3 reps;30 seconds   Quad Stretch Limitations prone with rope    Knee: Self-Stretch Limitations knee drivers  8" box 10 reps, 3 second holds at max flexion    Gastroc Stretch 3 reps;30 seconds   Gastroc Stretch Limitations slant board   Knee/Hip Exercises: Standing   Functional Squat 1 set;10 reps   Functional Squat Limitations on elevated mat table without UEs, eccentric lower    Rocker Board Limitations x20 AP, x20 lateral, fingertip touch U HHA    Other Standing Knee Exercises Stepping over 4 inch hurdles in parallel bars with focus on  reducing circumduction    Knee/Hip Exercises: Supine   Bridges Both;20 reps;1 set   Bridges Limitations standard form                 PT Education - 01/31/15 1129    Education provided Yes   Education Details importance of reducing circumduction of LEs when navigating obstacles   Person(s) Educated Patient   Methods Explanation   Comprehension Verbalized understanding          PT Short Term Goals - 01/27/15 1033    PT SHORT TERM GOAL #1   Title Patient will demonstrate no less than 7 degrees extension and at least 90 degrees flexion in pain free range    Status On-going   PT SHORT TERM GOAL #2   Title Patient will demonstrate at least 4/5 strength in bilateral lower extremities and all proximal muscle groups    Status Achieved   PT SHORT TERM GOAL #3   Title Patient will be able to ambulate at least 45 minutes over even and uneven surfaces with single point cane and R knee pain no more than 2/10   Status On-going   PT SHORT TERM GOAL #4   Title Patient will be able to perform functional sit to stand transfer without the use of her upper extremities and R knee pain no more than 2/10   Status On-going   PT SHORT TERM GOAL #5   Title Patient to demonstrate reduced levels of edema in her R knee and will be able to successfully self-perform appropriate massage technique to reduce edema and patellar mobilizations safely on an independent basis    Status On-going           PT Long Term Goals - 01/27/15 1034    PT LONG TERM GOAL #1   Title Patient will be independent in advanced HEP and will be able to perform her exercise routine correctly on a consistent basis    Status On-going   PT LONG TERM GOAL #2   Title Patient will demonstrate no more than 3 degrees R knee extension and will be able to perform R  knee flexion of at least 115 degrees    Status On-going   PT LONG TERM GOAL #3   Title Patient will be able to ambulate unlimited distances on level and unlevel  surfaces with no assistive device, good balance, and minimal fall risk, R knee pain 0/10   Status On-going   PT LONG TERM GOAL #4   Title Patient will be able to ascend and descend full flight of stairs with U railing, no assistive device, and reciprocal pattern free of compensation strategies    Status On-going  PT LONG TERM GOAL #5   Title Patient will be unlimited in her ability to perform functional tasks and chores around her home with R knee pain 0/10 and no assistive device, minimal rest breaks    Status On-going               Plan - 01/31/15 1130    Clinical Impression Statement Continued session focus on functional stretches and exercises, also introduced hurdles with goal of reducing circumduction during gait today. Patient tolerated all activities well today. Note that MD returned JAS referral and advised that they do not feel patient is in need  of the JAS brace at this time.    Rehab Potential Good   PT Frequency 3x / week   PT Duration 8 weeks   PT Treatment/Interventions ADLs/Self Care Home Management;Gait training;Neuromuscular re-education;Stair training;Functional mobility training;Patient/family education;Cryotherapy;Therapeutic activities;Therapeutic exercise;Manual techniques;DME Instruction;Balance training   PT Next Visit Plan G-code due. Continue focus on AROM, glut strength. Continue hurdle training. Introduce stair training. Manual PRN.    PT Home Exercise Plan Quick re-eval next visit for return to MD on Monday.  Continue to focus on improving ROM   Consulted and Agree with Plan of Care Patient        Problem List Patient Active Problem List   Diagnosis Date Noted  . OA (osteoarthritis) of knee 12/13/2014    Deniece Ree PT, DPT [place] [address] [place], [place], [address] Phone: [phone]   Fax:  [phone]

## 2015-02-04 ENCOUNTER — Ambulatory Visit (HOSPITAL_COMMUNITY): Payer: Medicare Other

## 2015-02-04 DIAGNOSIS — R531 Weakness: Secondary | ICD-10-CM

## 2015-02-04 DIAGNOSIS — M25661 Stiffness of right knee, not elsewhere classified: Secondary | ICD-10-CM | POA: Diagnosis not present

## 2015-02-04 DIAGNOSIS — M25461 Effusion, right knee: Secondary | ICD-10-CM | POA: Diagnosis not present

## 2015-02-04 DIAGNOSIS — M25561 Pain in right knee: Secondary | ICD-10-CM | POA: Diagnosis not present

## 2015-02-04 DIAGNOSIS — M6281 Muscle weakness (generalized): Secondary | ICD-10-CM | POA: Diagnosis not present

## 2015-02-04 DIAGNOSIS — Z96651 Presence of right artificial knee joint: Secondary | ICD-10-CM

## 2015-02-04 DIAGNOSIS — Z471 Aftercare following joint replacement surgery: Secondary | ICD-10-CM | POA: Diagnosis not present

## 2015-02-04 DIAGNOSIS — M24661 Ankylosis, right knee: Secondary | ICD-10-CM

## 2015-02-04 DIAGNOSIS — R609 Edema, unspecified: Secondary | ICD-10-CM

## 2015-02-04 NOTE — Therapy (Deleted)
[place], [place], [address] Phone: [phone]   Fax:  [phone]  Physical Therapy Treatment  Patient Details  Name: [name] MRN: [id number] Date of Birth: [birth year] Referring Provider:  Gaynelle Arabian, MD  Encounter Date: 02/04/2015      PT End of Session - 02/04/15 1122    Visit Number 20   Number of Visits 24   Date for PT Re-Evaluation 02/15/15   Authorization Type Medicare    Authorization Time Period 12/23/14 to 02/22/15; G-code done 10th visit    Authorization - Visit Number 20   Authorization - Number of Visits 30   PT Start Time 1022   PT Stop Time 1117   PT Time Calculation (min) 55 min   Activity Tolerance Patient tolerated treatment well   Behavior During Therapy [facility] for tasks assessed/performed      Past Medical History  Diagnosis Date  . Hypertension   . Hypothyroidism   . Arthritis   . Hyperlipemia   . Wears glasses   . Complication of anesthesia     woke up too early from a surgery  . Bladder spasms     "spastic bladder" wears pads  . GERD (gastroesophageal reflux disease)     occ. frequent "hiccoughs" after "eating, indigestion"  . Trigger finger, left     alternate fingers affected. -Occ. shooting pain.  . Cancer     Skin cancer -scalp and eyebrow"basal cell"    Past Surgical History  Procedure Laterality Date  . Total hip arthroplasty  2006    right  . Total hip arthroplasty  2006    left  . Tonsillectomy    . Appendectomy    . Carpal tunnel release Left   . Thyroidectomy  1994  . Cardiac catheterization  2006    normal-done for work up pre hip surgeries  . Cholecystectomy  1997  . Abdominal hysterectomy  1989  . Polypectomy Bilateral 06/26/2013    Procedure: BILATERAL EXCISION OF NASAL POLYPS ;  Surgeon: Ascencion Dike, MD;  Location: [place];  Service: ENT;  Laterality: Bilateral;  . Septoplasty N/A 06/26/2013    Procedure: AND  SEPTOPLASTY;  Surgeon: Ascencion Dike, MD;  Location: [place];  Service: ENT;  Laterality: N/A;  . Joint replacement      BTHA  . Total knee arthroplasty Right 12/13/2014    Procedure: RIGHT TOTAL KNEE ARTHROPLASTY;  Surgeon: Gaynelle Arabian, MD;  Location[facility];  Service: Orthopedics;  Laterality: Right;    There were no vitals filed for this visit.  Visit Diagnosis:  Status post total right knee replacement  Knee pain, right  Edema  Weakness generalized  Decreased range of motion of knee, right      Subjective Assessment - 02/04/15 1027    Subjective Pt stated she's having increased difficulty with sleeping due to knee pain.  Reports of heat from knee.  Pt stated she has increased her walking distance outside and today first day driving.     Pertinent History Knee replacement surgery was done on 12/13/14; had seen multiple orthopedic specialists. Had both hips replaced around 2006. Has known for at least 4 years that both knees are bone on bone; finally got knee replacement scheduled.  Went to [facility] after the surgery.   Currently in Pain? Yes   Pain Score 3    Pain Location Knee   Pain Orientation Right;Anterior;Lateral  [facility] PT Assessment - 02/04/15 0001    Assessment   Medical Diagnosis R total knee replacement    Onset Date/Surgical Date 12/13/14   Next MD Visit [place] 02/11/2015   Observation/Other Assessments   Focus on Therapeutic Outcomes (FOTO)  47% limited (was 51% on 01/14/2015)                     [place] Adult PT Treatment/Exercise - 02/04/15 0001    Knee/Hip Exercises: Stretches   Active Hamstring Stretch 3 reps;30 seconds   Active Hamstring Stretch Limitations 14 inch box    Quad Stretch 3 reps;30 seconds   Quad Stretch Limitations prone with rope    Hip Flexor Stretch 2 reps;30 seconds   Hip Flexor Stretch Limitations 12in step   Knee: Self-Stretch Limitations knee drivers  8" box 10 reps, 3 second holds at max  flexion    Gastroc Stretch 3 reps;30 seconds   Gastroc Stretch Limitations slant board   Knee/Hip Exercises: Standing   Lateral Step Up Right;10 reps;Hand Hold: 2;Step Height: 2"   Forward Step Up Right;10 reps;Hand Hold: 1;Step Height: 4"   Functional Squat 1 set;10 reps   Functional Squat Limitations infront of chair. no HHA   Other Standing Knee Exercises Stepping over 6 inch hurdles in parallel bars with focus on reducing circumduction and impr   Other Standing Knee Exercises 3D hip excursions split stance 10x each   Knee/Hip Exercises: Supine   Heel Slides 15 reps   Heel Slides Limitations 3-90   Patellar Mobs instructed all directions   Knee Flexion PROM   Manual Therapy   Manual Therapy Myofascial release;Soft tissue mobilization   Soft tissue mobilization massage to reduce edema in knee    Other Manual Therapy scar mobilization, patella mobs all directions, pt instructed                PT Education - 02/04/15 1303    Education provided Yes   Education Details Instructed patella mobs all directions   Methods Explanation;Demonstration   Comprehension Verbalized understanding;Returned demonstration          PT Short Term Goals - 02/04/15 1137    PT SHORT TERM GOAL #1   Title Patient will demonstrate no less than 7 degrees extension and at least 90 degrees flexion in pain free range    Baseline 02/04/2015: AROM 3-90 degrees   Status Partially Met   PT SHORT TERM GOAL #2   Title Patient will demonstrate at least 4/5 strength in bilateral lower extremities and all proximal muscle groups    Status Achieved   PT SHORT TERM GOAL #3   Title Patient will be able to ambulate at least 45 minutes over even and uneven surfaces with single point cane and R knee pain no more than 2/10   Status On-going   PT SHORT TERM GOAL #4   Title Patient will be able to perform functional sit to stand transfer without the use of her upper extremities and R knee pain no more than 2/10    Status On-going   PT SHORT TERM GOAL #5   Status On-going           PT Long Term Goals - 02/04/15 1308    PT LONG TERM GOAL #1   Title Patient will be independent in advanced HEP and will be able to perform her exercise routine correctly on a consistent basis    Status On-going   PT LONG TERM GOAL #2  Title Patient will demonstrate no more than 3 degrees R knee extension and will be able to perform R  knee flexion of at least 115 degrees    Baseline 02/04/2015 3-90 degrees   Status On-going   PT LONG TERM GOAL #3   Title Patient will be able to ambulate unlimited distances on level and unlevel surfaces with no assistive device, good balance, and minimal fall risk, R knee pain 0/10   Status On-going   PT LONG TERM GOAL #4   Title Patient will be able to ascend and descend full flight of stairs with U railing, no assistive device, and reciprocal pattern free of compensation strategies    Status On-going   PT LONG TERM GOAL #5   Title Patient will be unlimited in her ability to perform functional tasks and chores around her home with R knee pain 0/10 and no assistive device, minimal rest breaks    Status On-going               Plan - 02/04/15 1131    Clinical Impression Statement Session focus on improving AROM with stretches and mobilty exercises as well as funcitonal strengthening.  Continued hurdle walking to improve gait mechanics with cueing to reduce circumduction and increase knee flexion.  Introduced IT trainer this session for functional strengthening with cueing to reduce circumduction when ascending stairs and to improve knee flexion and reduce hip hiking with lateral step ups.  Ended session wth manual to reduce adhesions around scar tissue and patella mobs complete to reduce adhesions posterior patella.  Pt instructed technique and able to return demonstration correctly, pt encouraged to complete at home.  Improved AROM following manual with AROM 3-90 degrees  [place].  Gcode complete with score of 52.67% (was 33.35% initial eval.)  No reports of pain at end of session.     PT Next Visit Plan Reassessment next session prior MD apt on 02/11/2015.  Continue focus on AROM, glut strength. Continue hurdle training. Introduce stair training. Manual PRN. .       Problem List Patient Active Problem List   Diagnosis Date Noted  . OA (osteoarthritis) of knee 12/13/2014   Ihor Austin PTA [place] [address] [place], [place], [address] Phone: [phone]   Fax:  [phone]

## 2015-02-04 NOTE — Therapy (Addendum)
[place], [place], [address] Phone: [phone]   Fax:  [phone]  Physical Therapy Treatment  Patient Details  Name: [name] MRN: [id number] Date of Birth: 1941-05-04 Referring Provider:  Gaynelle Arabian, MD  Encounter Date: 02/04/2015      PT End of Session - 02/04/15 1122    Visit Number 20   Number of Visits 24   Date for PT Re-Evaluation 02/15/15   Authorization Type Medicare    Authorization Time Period 12/23/14 to 02/22/15; G-code done 10th visit    Authorization - Visit Number 20   Authorization - Number of Visits 30   PT Start Time 1022   PT Stop Time 1117   PT Time Calculation (min) 55 min   Activity Tolerance Patient tolerated treatment well   Behavior During Therapy [facility] for tasks assessed/performed      Past Medical History  Diagnosis Date  . Hypertension   . Hypothyroidism   . Arthritis   . Hyperlipemia   . Wears glasses   . Complication of anesthesia     woke up too early from a surgery  . Bladder spasms     "spastic bladder" wears pads  . GERD (gastroesophageal reflux disease)     occ. frequent "hiccoughs" after "eating, indigestion"  . Trigger finger, left     alternate fingers affected. -Occ. shooting pain.  . Cancer     Skin cancer -scalp and eyebrow"basal cell"    Past Surgical History  Procedure Laterality Date  . Total hip arthroplasty  2006    right  . Total hip arthroplasty  2006    left  . Tonsillectomy    . Appendectomy    . Carpal tunnel release Left   . Thyroidectomy  1994  . Cardiac catheterization  2006    normal-done for work up pre hip surgeries  . Cholecystectomy  1997  . Abdominal hysterectomy  1989  . Polypectomy Bilateral 06/26/2013    Procedure: BILATERAL EXCISION OF NASAL POLYPS ;  Surgeon: Ascencion Dike, MD;  Location: [place];  Service: ENT;  Laterality: Bilateral;  . Septoplasty N/A 06/26/2013    Procedure: AND  SEPTOPLASTY;  Surgeon: Ascencion Dike, MD;  Location: [place];  Service: ENT;  Laterality: N/A;  . Joint replacement      BTHA  . Total knee arthroplasty Right 12/13/2014    Procedure: RIGHT TOTAL KNEE ARTHROPLASTY;  Surgeon: Gaynelle Arabian, MD;  Location: [facility];  Service: Orthopedics;  Laterality: Right;    There were no vitals filed for this visit.  Visit Diagnosis:  Status post total right knee replacement  Knee pain, right  Edema  Weakness generalized  Decreased range of motion of knee, right      Subjective Assessment - 02/04/15 1027    Subjective Pt stated she's having increased difficulty with sleeping due to knee pain.  Reports of heat from knee.  Pt stated she has increased her walking distance outside and today first day driving.     Pertinent History Knee replacement surgery was done on 12/13/14; had seen multiple orthopedic specialists. Had both hips replaced around 2006. Has known for at least 4 years that both knees are bone on bone; finally got knee replacement scheduled.  Went to [facility] after the surgery.   Currently in Pain? Yes   Pain Score 3    Pain Location Knee   Pain Orientation Right;Anterior;Lateral  [facility] PT Assessment - 02/04/15 0001    Assessment   Medical Diagnosis R total knee replacement    Onset Date/Surgical Date 12/13/14   Next MD Visit [place] 02/11/2015   Observation/Other Assessments   Focus on Therapeutic Outcomes (FOTO)  47% limited (was 51% on 01/14/2015)                     [place] Adult PT Treatment/Exercise - 02/04/15 0001    Knee/Hip Exercises: Stretches   Active Hamstring Stretch 3 reps;30 seconds   Active Hamstring Stretch Limitations 14 inch box    Quad Stretch 3 reps;30 seconds   Quad Stretch Limitations prone with rope    Hip Flexor Stretch 2 reps;30 seconds   Hip Flexor Stretch Limitations 12in step   Knee: Self-Stretch Limitations knee drivers  8" box 10 reps, 3 second holds at max  flexion    Gastroc Stretch 3 reps;30 seconds   Gastroc Stretch Limitations slant board   Knee/Hip Exercises: Standing   Lateral Step Up Right;10 reps;Hand Hold: 2;Step Height: 2"   Forward Step Up Right;10 reps;Hand Hold: 1;Step Height: 4"   Functional Squat 1 set;10 reps   Functional Squat Limitations infront of chair. no HHA   Other Standing Knee Exercises Stepping over 6 inch hurdles in parallel bars with focus on reducing circumduction and impr   Other Standing Knee Exercises 3D hip excursions split stance 10x each   Knee/Hip Exercises: Supine   Heel Slides 15 reps   Heel Slides Limitations 3-90   Patellar Mobs instructed all directions   Knee Flexion PROM   Manual Therapy   Manual Therapy Myofascial release;Soft tissue mobilization   Soft tissue mobilization massage to reduce edema in knee    Other Manual Therapy scar mobilization, patella mobs all directions, pt instructed                PT Education - 02/04/15 1303    Education provided Yes   Education Details Instructed patella mobs all directions   Methods Explanation;Demonstration   Comprehension Verbalized understanding;Returned demonstration          PT Short Term Goals - 02/04/15 1137    PT SHORT TERM GOAL #1   Title Patient will demonstrate no less than 7 degrees extension and at least 90 degrees flexion in pain free range    Baseline 02/04/2015: AROM 3-90 degrees   Status Partially Met   PT SHORT TERM GOAL #2   Title Patient will demonstrate at least 4/5 strength in bilateral lower extremities and all proximal muscle groups    Status Achieved   PT SHORT TERM GOAL #3   Title Patient will be able to ambulate at least 45 minutes over even and uneven surfaces with single point cane and R knee pain no more than 2/10   Status On-going   PT SHORT TERM GOAL #4   Title Patient will be able to perform functional sit to stand transfer without the use of her upper extremities and R knee pain no more than 2/10    Status On-going   PT SHORT TERM GOAL #5   Status On-going           PT Long Term Goals - 02/04/15 1308    PT LONG TERM GOAL #1   Title Patient will be independent in advanced HEP and will be able to perform her exercise routine correctly on a consistent basis    Status On-going   PT LONG TERM GOAL #2  Title Patient will demonstrate no more than 3 degrees R knee extension and will be able to perform R  knee flexion of at least 115 degrees    Baseline 08-Feb-2015 3-90 degrees   Status On-going   PT LONG TERM GOAL #3   Title Patient will be able to ambulate unlimited distances on level and unlevel surfaces with no assistive device, good balance, and minimal fall risk, R knee pain 0/10   Status On-going   PT LONG TERM GOAL #4   Title Patient will be able to ascend and descend full flight of stairs with U railing, no assistive device, and reciprocal pattern free of compensation strategies    Status On-going   PT LONG TERM GOAL #5   Title Patient will be unlimited in her ability to perform functional tasks and chores around her home with R knee pain 0/10 and no assistive device, minimal rest breaks    Status On-going               Plan - 2015/02/08 1131    Clinical Impression Statement Session focus on improving AROM with stretches and mobilty exercises as well as funcitonal strengthening.  Continued hurdle walking to improve gait mechanics with cueing to reduce circumduction and increase knee flexion.  Introduced IT trainer this session for functional strengthening with cueing to reduce circumduction when ascending stairs and to improve knee flexion and reduce hip hiking with lateral step ups.  Ended session wth manual to reduce adhesions around scar tissue and patella mobs complete to reduce adhesions posterior patella.  Pt instructed technique and able to return demonstration correctly, pt encouraged to complete at home.  Improved AROM following manual with AROM 3-90 degrees  todau.  Gcode complete with score of 52.67% (was 33.35% initial eval.)  No reports of pain at end of session.     PT Next Visit Plan Reassessment next session prior MD apt on 02/11/2015.  Continue focus on AROM, glut strength. Continue hurdle training. Introduce stair training. Manual PRN. .        Problem List Patient Active Problem List   Diagnosis Date Noted  . OA (osteoarthritis) of knee 12/13/2014   Aldona Lento, PTA  Aldona Lento February 08, 2015, 1:22 PM       G-Codes - 08-Feb-2015 1654    Functional Assessment Tool Used FOTO 47% limited    Functional Limitation Mobility: Walking and moving around   Mobility: Walking and Moving Around Current Status [phone]) At least 40 percent but less than 60 percent impaired, limited or restricted   Mobility: Walking and Moving Around Goal Status [phone]) At least 20 percent but less than 40 percent impaired, limited or restricted     Deniece Ree PT, DPT [place], [place], [address] Phone: [phone]   Fax:  [phone]

## 2015-02-05 ENCOUNTER — Ambulatory Visit (HOSPITAL_COMMUNITY): Payer: Medicare Other | Attending: Orthopedic Surgery | Admitting: Physical Therapy

## 2015-02-05 DIAGNOSIS — M24661 Ankylosis, right knee: Secondary | ICD-10-CM

## 2015-02-05 DIAGNOSIS — M25661 Stiffness of right knee, not elsewhere classified: Secondary | ICD-10-CM | POA: Diagnosis not present

## 2015-02-05 DIAGNOSIS — R609 Edema, unspecified: Secondary | ICD-10-CM

## 2015-02-05 DIAGNOSIS — M6281 Muscle weakness (generalized): Secondary | ICD-10-CM | POA: Insufficient documentation

## 2015-02-05 DIAGNOSIS — Z471 Aftercare following joint replacement surgery: Secondary | ICD-10-CM | POA: Insufficient documentation

## 2015-02-05 DIAGNOSIS — M25461 Effusion, right knee: Secondary | ICD-10-CM | POA: Diagnosis not present

## 2015-02-05 DIAGNOSIS — M25561 Pain in right knee: Secondary | ICD-10-CM

## 2015-02-05 DIAGNOSIS — Z96651 Presence of right artificial knee joint: Secondary | ICD-10-CM | POA: Diagnosis not present

## 2015-02-05 DIAGNOSIS — R531 Weakness: Secondary | ICD-10-CM

## 2015-02-05 NOTE — Therapy (Signed)
[place], [place], [address] Phone: [phone]   Fax:  [phone]  Physical Therapy Treatment  Patient Details  Name: [name] MRN: [id number] Date of Birth: 10-25-1940 Referring Provider:  Gaynelle Arabian, MD  Encounter Date: 02/05/2015      PT End of Session - 02/05/15 1103    Visit Number 21   Number of Visits 24   Date for PT Re-Evaluation 03/05/15   Authorization Type Medicare    Authorization Time Period 12/23/14 to 02/22/15; G-code done 10th visit    Authorization - Visit Number 21   Authorization - Number of Visits 30   PT Start Time 1016   PT Stop Time 1059   PT Time Calculation (min) 43 min   Activity Tolerance Patient tolerated treatment well   Behavior During Therapy [facility] for tasks assessed/performed      Past Medical History  Diagnosis Date  . Hypertension   . Hypothyroidism   . Arthritis   . Hyperlipemia   . Wears glasses   . Complication of anesthesia     woke up too early from a surgery  . Bladder spasms     "spastic bladder" wears pads  . GERD (gastroesophageal reflux disease)     occ. frequent "hiccoughs" after "eating, indigestion"  . Trigger finger, left     alternate fingers affected. -Occ. shooting pain.  . Cancer     Skin cancer -scalp and eyebrow"basal cell"    Past Surgical History  Procedure Laterality Date  . Total hip arthroplasty  2006    right  . Total hip arthroplasty  2006    left  . Tonsillectomy    . Appendectomy    . Carpal tunnel release Left   . Thyroidectomy  1994  . Cardiac catheterization  2006    normal-done for work up pre hip surgeries  . Cholecystectomy  1997  . Abdominal hysterectomy  1989  . Polypectomy Bilateral 06/26/2013    Procedure: BILATERAL EXCISION OF NASAL POLYPS ;  Surgeon: Ascencion Dike, MD;  Location: [place];  Service: ENT;  Laterality: Bilateral;  . Septoplasty N/A 06/26/2013    Procedure: AND SEPTOPLASTY;   Surgeon: Ascencion Dike, MD;  Location: [place];  Service: ENT;  Laterality: N/A;  . Joint replacement      BTHA  . Total knee arthroplasty Right 12/13/2014    Procedure: RIGHT TOTAL KNEE ARTHROPLASTY;  Surgeon: Gaynelle Arabian, MD;  Location: [facility];  Service: Orthopedics;  Laterality: Right;    There were no vitals filed for this visit.  Visit Diagnosis:  Status post total right knee replacement  Knee pain, right  Edema  Weakness generalized  Decreased range of motion of knee, right      Subjective Assessment - 02/05/15 1027    Subjective Patient states that she is having trouble sleeping due to pain but that her pain is a little better this morning. Did not take pain pill this morning.    Pertinent History Knee replacement surgery was done on 12/13/14; had seen multiple orthopedic specialists. Had both hips replaced around 2006. Has known for at least 4 years that both knees are bone on bone; finally got knee replacement scheduled.  Went to [facility] after the surgery.   How long can you sit comfortably? 6/1- has sat in standard chair for around 45 minutes    How long can you stand comfortably? 6/1- improving, maybe around  45 minutes    How long can you walk comfortably? 6/1- maybe an hour with cane just in case she needs it    Patient Stated Goals getting back to total mobility and get rid of assistive device    Currently in Pain? Yes   Pain Score 3    Pain Location Knee   Pain Orientation Right            [facility] PT Assessment - 02/05/15 0001    AROM   Right Knee Extension 3   Right Knee Flexion 85  patient reports she is stiff as she did a lot this morning   Strength   Right Hip ABduction 4+/5   Left Hip ABduction 4+/5   Right Knee Flexion 4/5   Right Knee Extension 4+/5   Left Knee Flexion 5/5   Left Knee Extension 5/5   Right Ankle Dorsiflexion 5/5   Left Ankle Dorsiflexion 5/5                     [facility] Adult PT Treatment/Exercise -  02/05/15 0001    Knee/Hip Exercises: Stretches   Active Hamstring Stretch 3 reps;30 seconds   Active Hamstring Stretch Limitations 12 inch box    Knee: Self-Stretch Limitations knee drives 8 inch box 9D63 with 3 second holds    Gastroc Stretch 3 reps;30 seconds   Gastroc Stretch Limitations slant board                PT Education - 02/05/15 1103    Education provided Yes   Education Details progress with skilled PT services, plan of care moving forward    Person(s) Educated Patient   Methods Explanation   Comprehension Verbalized understanding          PT Short Term Goals - 02/05/15 1046    PT SHORT TERM GOAL #1   Title Patient will demonstrate no less than 7 degrees extension and at least 90 degrees flexion in pain free range    Baseline 02/04/2015: AROM 3-90 degrees   Time 2   Period Weeks   Status Achieved   PT SHORT TERM GOAL #2   Title Patient will demonstrate at least 4/5 strength in bilateral lower extremities and all proximal muscle groups    Baseline 6/1- extensors (glut max) remain weak based on functional observation    Time 2   Status Partially Met   PT SHORT TERM GOAL #3   Title Patient will be able to ambulate at least 45 minutes over even and uneven surfaces with single point cane and R knee pain no more than 2/10   Baseline 6/1- can states she can walk for about an hour in the store while carrying cane, hasn't tried uneven surfaces yet    Time 2   Period Weeks   Status Partially Met   PT SHORT TERM GOAL #4   Title Patient will be able to perform functional sit to stand transfer without the use of her upper extremities and R knee pain no more than 2/10   Baseline 6/1- patient states she continues to have difficulty getting up from surfaces    Time 2   Period Weeks   Status On-going   PT SHORT TERM GOAL #5   Title Patient to demonstrate reduced levels of edema in her R knee and will be able to successfully self-perform appropriate massage technique  to reduce edema and patellar mobilizations safely on an independent basis    Baseline 6/1-  improvements in scar moblization and patella mobilization, still working to reduce edema    Time 2   Period Weeks   Status On-going           PT Long Term Goals - 02/05/15 1052    PT LONG TERM GOAL #1   Title Patient will be independent in advanced HEP and will be able to perform her exercise routine correctly on a consistent basis    Baseline 6/1- patient states that she is doing them, just not 3 times a day    Time 4   Period Weeks   Status On-going   PT LONG TERM GOAL #2   Title Patient will demonstrate no more than 3 degrees R knee extension and will be able to perform R  knee flexion of at least 115 degrees    Baseline 02/04/2015 3-90 degrees   Time 4   Period Weeks   Status On-going   PT LONG TERM GOAL #3   Title Patient will be able to ambulate unlimited distances on level and unlevel surfaces with no assistive device, good balance, and minimal fall risk, R knee pain 0/10   Time 4   Period Weeks   Status On-going   PT LONG TERM GOAL #4   Title Patient will be able to ascend and descend full flight of stairs with U railing, no assistive device, and reciprocal pattern free of compensation strategies    Time 4   Period Weeks   Status On-going   PT LONG TERM GOAL #5   Title Patient will be unlimited in her ability to perform functional tasks and chores around her home with R knee pain 0/10 and no assistive device, minimal rest breaks    Time 4   Period Weeks   Status Achieved               Plan - 02/05/15 1103    Clinical Impression Statement Re-assessment performed today due to MD appointment on 02/11/15. Patient continues to demonstrate functional weakness in gluteal musculature (as evidenced in part by continued difficulty in sit to stand from chair), reduced ROM surgical LE, difficulty with stairs,  edema in R knee, and pain in R knee. Patient states that her main concerns  at this time are stair navigation, improving her ability to stand up from various surfances, addressing the range of motion in her knee, and improving her ability to ambulate over uneven surfaces. Today patient's ROM appeared reduced compared to her norm however at best she has reached ROM of 3-90 degrees actively and 0-92 passively. Patient will benefit from a continuation of skilled PT services, at approximately 3x/week for 4 more weeks, in order to address these deficits and to assist her in reaching an optimal overall level of function.    Pt will benefit from skilled therapeutic intervention in order to improve on the following deficits Abnormal gait;Decreased coordination;Decreased range of motion;Difficulty walking;Impaired flexibility;Improper body mechanics;Decreased endurance;Postural dysfunction;Decreased activity tolerance;Decreased balance;Decreased knowledge of use of DME;Decreased scar mobility;Increased muscle spasms;Pain;Decreased mobility;Decreased strength   Rehab Potential Good   PT Frequency 3x / week   PT Duration 4 weeks   PT Treatment/Interventions ADLs/Self Care Home Management;Gait training;Neuromuscular re-education;Stair training;Functional mobility training;Patient/family education;Cryotherapy;Therapeutic activities;Therapeutic exercise;Manual techniques;DME Instruction;Balance training   PT Next Visit Plan Continue focus on ROM, glut strength. Continue hurdles in preparation for stair training. Introduce stair training. Work on sit to Engineer, manufacturing systems.    Consulted and Agree with Plan of Care Patient  G-Codes - 02/04/15 1654    Functional Assessment Tool Used FOTO 47% limited    Functional Limitation Mobility: Walking and moving around   Mobility: Walking and Moving Around Current Status [phone]) At least 40 percent but less than 60 percent impaired, limited or restricted   Mobility: Walking and Moving Around Goal Status [phone]) At least 20 percent but less than  40 percent impaired, limited or restricted      Problem List Patient Active Problem List   Diagnosis Date Noted  . OA (osteoarthritis) of knee 12/13/2014    Deniece Ree PT, DPT [place] [address] [place], [place], [address] Phone: [phone]   Fax:  [phone]

## 2015-02-07 ENCOUNTER — Ambulatory Visit (HOSPITAL_COMMUNITY): Payer: Medicare Other | Admitting: Physical Therapy

## 2015-02-07 DIAGNOSIS — M25561 Pain in right knee: Secondary | ICD-10-CM | POA: Diagnosis not present

## 2015-02-07 DIAGNOSIS — R609 Edema, unspecified: Secondary | ICD-10-CM

## 2015-02-07 DIAGNOSIS — Z96651 Presence of right artificial knee joint: Secondary | ICD-10-CM | POA: Diagnosis not present

## 2015-02-07 DIAGNOSIS — R531 Weakness: Secondary | ICD-10-CM

## 2015-02-07 DIAGNOSIS — M25661 Stiffness of right knee, not elsewhere classified: Secondary | ICD-10-CM | POA: Diagnosis not present

## 2015-02-07 DIAGNOSIS — M25461 Effusion, right knee: Secondary | ICD-10-CM | POA: Diagnosis not present

## 2015-02-07 DIAGNOSIS — M24661 Ankylosis, right knee: Secondary | ICD-10-CM

## 2015-02-07 DIAGNOSIS — Z471 Aftercare following joint replacement surgery: Secondary | ICD-10-CM | POA: Diagnosis not present

## 2015-02-07 DIAGNOSIS — M6281 Muscle weakness (generalized): Secondary | ICD-10-CM | POA: Diagnosis not present

## 2015-02-07 NOTE — Therapy (Signed)
[place], [place], [address] Phone: [phone]   Fax:  [phone]  Physical Therapy Treatment  Patient Details  Name: [name] MRN: [id number] Date of Birth: [birth year] Referring Provider:  Gaynelle Arabian, MD  Encounter Date: 02/07/2015      PT End of Session - 02/07/15 1249    Visit Number 22   Number of Visits 24   Date for PT Re-Evaluation 03/05/15   Authorization Type Medicare    Authorization Time Period 12/23/14 to 02/22/15; G-code done 10th visit    Authorization - Visit Number 68   Authorization - Number of Visits 30   PT Start Time 1021   PT Stop Time 1100   PT Time Calculation (min) 39 min   Activity Tolerance Patient tolerated treatment well   Behavior During Therapy [facility] for tasks assessed/performed      Past Medical History  Diagnosis Date  . Hypertension   . Hypothyroidism   . Arthritis   . Hyperlipemia   . Wears glasses   . Complication of anesthesia     woke up too early from a surgery  . Bladder spasms     "spastic bladder" wears pads  . GERD (gastroesophageal reflux disease)     occ. frequent "hiccoughs" after "eating, indigestion"  . Trigger finger, left     alternate fingers affected. -Occ. shooting pain.  . Cancer     Skin cancer -scalp and eyebrow"basal cell"    Past Surgical History  Procedure Laterality Date  . Total hip arthroplasty  2006    right  . Total hip arthroplasty  2006    left  . Tonsillectomy    . Appendectomy    . Carpal tunnel release Left   . Thyroidectomy  1994  . Cardiac catheterization  2006    normal-done for work up pre hip surgeries  . Cholecystectomy  1997  . Abdominal hysterectomy  1989  . Polypectomy Bilateral 06/26/2013    Procedure: BILATERAL EXCISION OF NASAL POLYPS ;  Surgeon: Ascencion Dike, MD;  Location: [place];  Service: ENT;  Laterality: Bilateral;  . Septoplasty N/A 06/26/2013    Procedure: AND SEPTOPLASTY;   Surgeon: Ascencion Dike, MD;  Location: [place];  Service: ENT;  Laterality: N/A;  . Joint replacement      BTHA  . Total knee arthroplasty Right 12/13/2014    Procedure: RIGHT TOTAL KNEE ARTHROPLASTY;  Surgeon: Abigail Arabian, MD;  Location: [facility];  Service: Orthopedics;  Laterality: Right;    There were no vitals filed for this visit.  Visit Diagnosis:  Status post total right knee replacement  Knee pain, right  Edema  Weakness generalized  Decreased range of motion of knee, right      Subjective Assessment - 02/07/15 1023    Subjective Patient reports that she slept much better last night, was only able to sleep for around 10 minutes at a time but did get more rest and is feeling better today    Pertinent History Knee replacement surgery was done on 12/13/14; had seen multiple orthopedic specialists. Had both hips replaced around 2006. Has known for at least 4 years that both knees are bone on bone; finally got knee replacement scheduled.  Went to [employer] [facility] after the surgery.   Currently in Pain? Yes   Pain Score 2    Pain Location Knee   Pain Orientation Right   Pain Descriptors / Indicators  Other (Comment)  around the knee cap, twinging and pulling                          [facility] Adult PT Treatment/Exercise - 02/07/15 0001    Knee/Hip Exercises: Stretches   Active Hamstring Stretch 3 reps;30 seconds   Active Hamstring Stretch Limitations 14 inch box    Quad Stretch 3 reps;30 seconds   Quad Stretch Limitations prone with rope    Knee: Self-Stretch Limitations knee drives 8 inch box [id number] with 3 second holds    Press photographer 3 reps;30 seconds   Gastroc Stretch Limitations slant board   Knee/Hip Exercises: Standing   Forward Step Up Both;1 set;10 reps   Forward Step Up Limitations 4 inch step    Functional Squat 1 set;15 reps   Functional Squat Limitations in front of mat table, no HHA    Rocker Board Limitations x20 AP, x20 lateral,  fingertip touch U HHA    Other Standing Knee Exercises Stepping over 6 inch hurdles in parallel bars with focus on reducing circumduction and impr                PT Education - 02/07/15 1249    Education provided Yes   Education Details educated to continue walking over uneven surfaces over weekend with cane    Person(s) Educated Patient   Methods Explanation   Comprehension Verbalized understanding          PT Short Term Goals - 02/05/15 1046    PT SHORT TERM GOAL #1   Title Patient will demonstrate no less than 7 degrees extension and at least 90 degrees flexion in pain free range    Baseline 02/04/2015: AROM 3-90 degrees   Time 2   Period Weeks   Status Achieved   PT SHORT TERM GOAL #2   Title Patient will demonstrate at least 4/5 strength in bilateral lower extremities and all proximal muscle groups    Baseline 6/1- extensors (glut max) remain weak based on functional observation    Time 2   Status Partially Met   PT SHORT TERM GOAL #3   Title Patient will be able to ambulate at least 45 minutes over even and uneven surfaces with single point cane and R knee pain no more than 2/10   Baseline 6/1- can states she can walk for about an hour in the store while carrying cane, hasn't tried uneven surfaces yet    Time 2   Period Weeks   Status Partially Met   PT SHORT TERM GOAL #4   Title Patient will be able to perform functional sit to stand transfer without the use of her upper extremities and R knee pain no more than 2/10   Baseline 6/1- patient states she continues to have difficulty getting up from surfaces    Time 2   Period Weeks   Status On-going   PT SHORT TERM GOAL #5   Title Patient to demonstrate reduced levels of edema in her R knee and will be able to successfully self-perform appropriate massage technique to reduce edema and patellar mobilizations safely on an independent basis    Baseline 6/1- improvements in scar moblization and patella mobilization,  still working to reduce edema    Time 2   Period Weeks   Status On-going           PT Long Term Goals - 02/05/15 1052    PT LONG TERM GOAL #1  Title Patient will be independent in advanced HEP and will be able to perform her exercise routine correctly on a consistent basis    Baseline 6/1- patient states that she is doing them, just not 3 times a day    Time 4   Period Weeks   Status On-going   PT LONG TERM GOAL #2   Title Patient will demonstrate no more than 3 degrees R knee extension and will be able to perform R  knee flexion of at least 115 degrees    Baseline 02/04/2015 3-90 degrees   Time 4   Period Weeks   Status On-going   PT LONG TERM GOAL #3   Title Patient will be able to ambulate unlimited distances on level and unlevel surfaces with no assistive device, good balance, and minimal fall risk, R knee pain 0/10   Time 4   Period Weeks   Status On-going   PT LONG TERM GOAL #4   Title Patient will be able to ascend and descend full flight of stairs with U railing, no assistive device, and reciprocal pattern free of compensation strategies    Time 4   Period Weeks   Status On-going   PT LONG TERM GOAL #5   Title Patient will be unlimited in her ability to perform functional tasks and chores around her home with R knee pain 0/10 and no assistive device, minimal rest breaks    Time 4   Period Weeks   Status Achieved               Plan - 02/07/15 1250    Clinical Impression Statement Continued functional exericses and stretches today with good tolerance by patient; continues to require cues for proper navigation of hurdles today, continues to demonstrate large amounts of circumduction.    Pt will benefit from skilled therapeutic intervention in order to improve on the following deficits Abnormal gait;Decreased coordination;Decreased range of motion;Difficulty walking;Impaired flexibility;Improper body mechanics;Decreased endurance;Postural dysfunction;Decreased  activity tolerance;Decreased balance;Decreased knowledge of use of DME;Decreased scar mobility;Increased muscle spasms;Pain;Decreased mobility;Decreased strength   Rehab Potential Good   PT Frequency 3x / week   PT Duration 4 weeks   PT Treatment/Interventions ADLs/Self Care Home Management;Gait training;Neuromuscular re-education;Stair training;Functional mobility training;Patient/family education;Cryotherapy;Therapeutic activities;Therapeutic exercise;Manual techniques;DME Instruction;Balance training   PT Next Visit Plan Continue focus on ROM, glut strength. Continue hurdles in preparation for stair training. Introduce stair training. Work on sit to Engineer, manufacturing systems.    PT Home Exercise Plan Quick re-eval next visit for return to MD on Monday.  Continue to focus on improving ROM   Consulted and Agree with Plan of Care Patient        Problem List Patient Active Problem List   Diagnosis Date Noted  . OA (osteoarthritis) of knee 12/13/2014    Deniece Ree PT, DPT [facility] [address] [place], [place], [address] Phone: [phone]   Fax:  [phone]

## 2015-02-10 ENCOUNTER — Ambulatory Visit (HOSPITAL_COMMUNITY): Payer: Medicare Other | Admitting: Physical Therapy

## 2015-02-10 DIAGNOSIS — Z96651 Presence of right artificial knee joint: Secondary | ICD-10-CM

## 2015-02-10 DIAGNOSIS — R609 Edema, unspecified: Secondary | ICD-10-CM

## 2015-02-10 DIAGNOSIS — Z471 Aftercare following joint replacement surgery: Secondary | ICD-10-CM | POA: Diagnosis not present

## 2015-02-10 DIAGNOSIS — M25561 Pain in right knee: Secondary | ICD-10-CM

## 2015-02-10 DIAGNOSIS — M24661 Ankylosis, right knee: Secondary | ICD-10-CM

## 2015-02-10 DIAGNOSIS — M25461 Effusion, right knee: Secondary | ICD-10-CM | POA: Diagnosis not present

## 2015-02-10 DIAGNOSIS — R531 Weakness: Secondary | ICD-10-CM

## 2015-02-10 DIAGNOSIS — M25661 Stiffness of right knee, not elsewhere classified: Secondary | ICD-10-CM | POA: Diagnosis not present

## 2015-02-10 DIAGNOSIS — M6281 Muscle weakness (generalized): Secondary | ICD-10-CM | POA: Diagnosis not present

## 2015-02-10 NOTE — Therapy (Signed)
[place], [place], [address] Phone: [phone]   Fax:  [phone]  Physical Therapy Treatment  Patient Details  Name: [name] MRN: [id number] Date of Birth: 1941/02/22 Referring Provider:  Gaynelle Arabian, MD  Encounter Date: 02/10/2015      PT End of Session - 02/10/15 1716    Visit Number 23   Number of Visits 32   Date for PT Re-Evaluation 03/05/15   Authorization Type Medicare    Authorization Time Period 12/23/14 to 02/22/15; G-code done 10th visit    Authorization - Visit Number 23   Authorization - Number of Visits 30   PT Start Time 1020   PT Stop Time 1105   PT Time Calculation (min) 45 min   Activity Tolerance Patient tolerated treatment well   Behavior During Therapy [facility] for tasks assessed/performed      Past Medical History  Diagnosis Date  . Hypertension   . Hypothyroidism   . Arthritis   . Hyperlipemia   . Wears glasses   . Complication of anesthesia     woke up too early from a surgery  . Bladder spasms     "spastic bladder" wears pads  . GERD (gastroesophageal reflux disease)     occ. frequent "hiccoughs" after "eating, indigestion"  . Trigger finger, left     alternate fingers affected. -Occ. shooting pain.  . Cancer     Skin cancer -scalp and eyebrow"basal cell"    Past Surgical History  Procedure Laterality Date  . Total hip arthroplasty  2006    right  . Total hip arthroplasty  2006    left  . Tonsillectomy    . Appendectomy    . Carpal tunnel release Left   . Thyroidectomy  1994  . Cardiac catheterization  2006    normal-done for work up pre hip surgeries  . Cholecystectomy  1997  . Abdominal hysterectomy  1989  . Polypectomy Bilateral 06/26/2013    Procedure: BILATERAL EXCISION OF NASAL POLYPS ;  Surgeon: Ascencion Dike, MD;  Location: [place];  Service: ENT;  Laterality: Bilateral;  . Septoplasty N/A 06/26/2013    Procedure: AND SEPTOPLASTY;   Surgeon: Ascencion Dike, MD;  Location: [place];  Service: ENT;  Laterality: N/A;  . Joint replacement      BTHA  . Total knee arthroplasty Right 12/13/2014    Procedure: RIGHT TOTAL KNEE ARTHROPLASTY;  Surgeon: Gaynelle Arabian, MD;  Location: [facility];  Service: Orthopedics;  Laterality: Right;    There were no vitals filed for this visit.  Visit Diagnosis:  Status post total right knee replacement  Knee pain, right  Edema  Weakness generalized  Decreased range of motion of knee, right      Subjective Assessment - 02/10/15 1711    Subjective Pt statess she had not takne a pain pill since Saturday night and currently is without pain.  Statess he is doing her HEP.   Currently in Pain? No/denies                         [facility] Adult PT Treatment/Exercise - 02/10/15 1035    Knee/Hip Exercises: Stretches   Active Hamstring Stretch 3 reps;30 seconds   Active Hamstring Stretch Limitations 14 inch box    Knee: Self-Stretch Limitations knee drives 8 inch box [id number] with 3 second holds    Gastroc Stretch 3 reps;30 seconds  Gastroc Stretch Limitations slant board   Knee/Hip Exercises: Standing   Forward Step Up Both;1 set;10 reps   Forward Step Up Limitations 4 inch step    Functional Squat 1 set;15 reps   Functional Squat Limitations in front of mat table, no HHA    Rocker Board Limitations x20 AP, x20 lateral, fingertip touch U HHA    Other Standing Knee Exercises Stepping over 6 inch hurdles in parallel bars with focus on reducing circumduction and impr   Knee/Hip Exercises: Supine   Knee Flexion PROM   Manual Therapy   Manual Therapy Myofascial release;Soft tissue mobilization   Soft tissue mobilization massage to reduce edema in knee    Other Manual Therapy scar mobilization, patella mobs all directions, distraction                  PT Short Term Goals - 02/05/15 1046    PT SHORT TERM GOAL #1   Title Patient will demonstrate no less than  7 degrees extension and at least 90 degrees flexion in pain free range    Baseline 02/04/2015: AROM 3-90 degrees   Time 2   Period Weeks   Status Achieved   PT SHORT TERM GOAL #2   Title Patient will demonstrate at least 4/5 strength in bilateral lower extremities and all proximal muscle groups    Baseline 6/1- extensors (glut max) remain weak based on functional observation    Time 2   Status Partially Met   PT SHORT TERM GOAL #3   Title Patient will be able to ambulate at least 45 minutes over even and uneven surfaces with single point cane and R knee pain no more than 2/10   Baseline 6/1- can states she can walk for about an hour in the store while carrying cane, hasn't tried uneven surfaces yet    Time 2   Period Weeks   Status Partially Met   PT SHORT TERM GOAL #4   Title Patient will be able to perform functional sit to stand transfer without the use of her upper extremities and R knee pain no more than 2/10   Baseline 6/1- patient states she continues to have difficulty getting up from surfaces    Time 2   Period Weeks   Status On-going   PT SHORT TERM GOAL #5   Title Patient to demonstrate reduced levels of edema in her R knee and will be able to successfully self-perform appropriate massage technique to reduce edema and patellar mobilizations safely on an independent basis    Baseline 6/1- improvements in scar moblization and patella mobilization, still working to reduce edema    Time 2   Period Weeks   Status On-going           PT Long Term Goals - 02/05/15 1052    PT LONG TERM GOAL #1   Title Patient will be independent in advanced HEP and will be able to perform her exercise routine correctly on a consistent basis    Baseline 6/1- patient states that she is doing them, just not 3 times a day    Time 4   Period Weeks   Status On-going   PT LONG TERM GOAL #2   Title Patient will demonstrate no more than 3 degrees R knee extension and will be able to perform R  knee  flexion of at least 115 degrees    Baseline 02/04/2015 3-90 degrees   Time 4   Period Weeks   Status  On-going   PT LONG TERM GOAL #3   Title Patient will be able to ambulate unlimited distances on level and unlevel surfaces with no assistive device, good balance, and minimal fall risk, R knee pain 0/10   Time 4   Period Weeks   Status On-going   PT LONG TERM GOAL #4   Title Patient will be able to ascend and descend full flight of stairs with U railing, no assistive device, and reciprocal pattern free of compensation strategies    Time 4   Period Weeks   Status On-going   PT LONG TERM GOAL #5   Title Patient will be unlimited in her ability to perform functional tasks and chores around her home with R knee pain 0/10 and no assistive device, minimal rest breaks    Time 4   Period Weeks   Status Achieved               Plan - 02/10/15 1726    Clinical Impression Statement Rt knee continues to have alot of adhesions with frozen patellar mobility.  Large amount of time spent on increasing mobility and glide of knee.   Less circumduction today noted with hurdle navigation, however continues to present with antalgic gait.  Pt with flexion limitation of 90 degrees   PT Next Visit Plan Continue focus on ROM, glut strength. Continue hurdles in preparation for stair training. Introduce stair training. Work on sit to Engineer, manufacturing systems. complete manual to decrease adhesions and continue PROM to increase flexion of  Rt knee.   Consulted and Agree with Plan of Care Patient        Problem List Patient Active Problem List   Diagnosis Date Noted  . OA (osteoarthritis) of knee 12/13/2014    Teena Irani, PTA/CLT [phone]  02/10/2015, 5:42 PM  [place] [address] [place], [place], [address] Phone: [phone]   Fax:  [phone]

## 2015-02-11 DIAGNOSIS — Z471 Aftercare following joint replacement surgery: Secondary | ICD-10-CM | POA: Diagnosis not present

## 2015-02-11 DIAGNOSIS — Z96651 Presence of right artificial knee joint: Secondary | ICD-10-CM | POA: Diagnosis not present

## 2015-02-12 ENCOUNTER — Ambulatory Visit (HOSPITAL_COMMUNITY): Payer: Medicare Other

## 2015-02-12 DIAGNOSIS — M6281 Muscle weakness (generalized): Secondary | ICD-10-CM | POA: Diagnosis not present

## 2015-02-12 DIAGNOSIS — R531 Weakness: Secondary | ICD-10-CM

## 2015-02-12 DIAGNOSIS — M24661 Ankylosis, right knee: Secondary | ICD-10-CM

## 2015-02-12 DIAGNOSIS — Z96651 Presence of right artificial knee joint: Secondary | ICD-10-CM

## 2015-02-12 DIAGNOSIS — R609 Edema, unspecified: Secondary | ICD-10-CM

## 2015-02-12 DIAGNOSIS — Z471 Aftercare following joint replacement surgery: Secondary | ICD-10-CM | POA: Diagnosis not present

## 2015-02-12 DIAGNOSIS — M25661 Stiffness of right knee, not elsewhere classified: Secondary | ICD-10-CM | POA: Diagnosis not present

## 2015-02-12 DIAGNOSIS — M25561 Pain in right knee: Secondary | ICD-10-CM

## 2015-02-12 DIAGNOSIS — M25461 Effusion, right knee: Secondary | ICD-10-CM | POA: Diagnosis not present

## 2015-02-12 NOTE — Therapy (Signed)
[place], [place], [address] Phone: [phone]   Fax:  [phone]  Physical Therapy Treatment  Patient Details  Name: [name] MRN: [id number] Date of Birth: 04-16-1941 Referring Provider:  Gaynelle Arabian, MD  Encounter Date: 02/12/2015      PT End of Session - 02/12/15 1042    Visit Number 24   Number of Visits 32   Date for PT Re-Evaluation 03/05/15   Authorization Type Medicare    Authorization Time Period 12/23/14 to 02/22/15; G-code done 10th visit    Authorization - Visit Number 24   Authorization - Number of Visits 30   PT Start Time 1017   PT Stop Time 1107   PT Time Calculation (min) 50 min   Activity Tolerance Patient tolerated treatment well   Behavior During Therapy [facility] for tasks assessed/performed      Past Medical History  Diagnosis Date  . Hypertension   . Hypothyroidism   . Arthritis   . Hyperlipemia   . Wears glasses   . Complication of anesthesia     woke up too early from a surgery  . Bladder spasms     "spastic bladder" wears pads  . GERD (gastroesophageal reflux disease)     occ. frequent "hiccoughs" after "eating, indigestion"  . Trigger finger, left     alternate fingers affected. -Occ. shooting pain.  . Cancer     Skin cancer -scalp and eyebrow"basal cell"    Past Surgical History  Procedure Laterality Date  . Total hip arthroplasty  2006    right  . Total hip arthroplasty  2006    left  . Tonsillectomy    . Appendectomy    . Carpal tunnel release Left   . Thyroidectomy  1994  . Cardiac catheterization  2006    normal-done for work up pre hip surgeries  . Cholecystectomy  1997  . Abdominal hysterectomy  1989  . Polypectomy Bilateral 06/26/2013    Procedure: BILATERAL EXCISION OF NASAL POLYPS ;  Surgeon: Ascencion Dike, MD;  Location: [place];  Service: ENT;  Laterality: Bilateral;  . Septoplasty N/A 06/26/2013    Procedure: AND SEPTOPLASTY;   Surgeon: Ascencion Dike, MD;  Location: [place];  Service: ENT;  Laterality: N/A;  . Joint replacement      BTHA  . Total knee arthroplasty Right 12/13/2014    Procedure: RIGHT TOTAL KNEE ARTHROPLASTY;  Surgeon: Gaynelle Arabian, MD;  [facility];  Service: Orthopedics;  Laterality: Right;    There were no vitals filed for this visit.  Visit Diagnosis:  Status post total right knee replacement  Knee pain, right  Edema  Weakness generalized  Decreased range of motion of knee, right      Subjective Assessment - 02/12/15 1025    Subjective Pt reported first day driving to [facility] for MD apt, reported MD was happy with ROM.  Pt stated she was tired and stiff following long day.  No reports of pain today.   Currently in Pain? No/denies            [facility] PT Assessment - 02/12/15 0001    Assessment   Medical Diagnosis R total knee replacement    Onset Date/Surgical Date 12/13/14   Next MD Visit [place] 03/14/2015                     [place] Adult PT Treatment/Exercise - 02/12/15 0001  Knee/Hip Exercises: Stretches   Active Hamstring Stretch 3 reps;30 seconds   Active Hamstring Stretch Limitations 14 inch box    Quad Stretch 3 reps;30 seconds   Quad Stretch Limitations prone with rope    Knee: Self-Stretch Limitations knee drives 8 inch box [id number] with 3 second holds    Press photographer 3 reps;30 seconds   Gastroc Stretch Limitations slant board   Knee/Hip Exercises: Standing   Lateral Step Up Right;10 reps;Hand Hold: 2;Step Height: 4"   Forward Step Up Both;1 set;15 reps;Hand Hold: 1;Step Height: 4"   Step Down Both;10 reps;Hand Hold: 1;Step Height: 4"   Functional Squat 1 set;15 reps   Functional Squat Limitations in front of mat table, no HHA    Other Standing Knee Exercises Stepping over 6 inch hurdles with 1 HHA focus on reducing circumduction and impr   Manual Therapy   Manual Therapy Myofascial release;Soft tissue mobilization   Soft  tissue mobilization massage to reduce edema in knee    Other Manual Therapy scar mobilization, patella mobs all directions, distraction                  PT Short Term Goals - 02/12/15 1236    PT SHORT TERM GOAL #1   Title Patient will demonstrate no less than 7 degrees extension and at least 90 degrees flexion in pain free range    Status Achieved   PT SHORT TERM GOAL #2   Title Patient will demonstrate at least 4/5 strength in bilateral lower extremities and all proximal muscle groups    Status Partially Met   PT SHORT TERM GOAL #3   Title Patient will be able to ambulate at least 45 minutes over even and uneven surfaces with single point cane and R knee pain no more than 2/10   Status Partially Met   PT SHORT TERM GOAL #4   Title Patient will be able to perform functional sit to stand transfer without the use of her upper extremities and R knee pain no more than 2/10   Status On-going   PT SHORT TERM GOAL #5   Title Patient to demonstrate reduced levels of edema in her R knee and will be able to successfully self-perform appropriate massage technique to reduce edema and patellar mobilizations safely on an independent basis    Status On-going           PT Long Term Goals - 02/12/15 1237    PT LONG TERM GOAL #1   Title Patient will be independent in advanced HEP and will be able to perform her exercise routine correctly on a consistent basis    Status On-going   PT LONG TERM GOAL #2   Title Patient will demonstrate no more than 3 degrees R knee extension and will be able to perform R  knee flexion of at least 115 degrees    Status On-going   PT LONG TERM GOAL #3   Title Patient will be able to ambulate unlimited distances on level and unlevel surfaces with no assistive device, good balance, and minimal fall risk, R knee pain 0/10   Status On-going   PT LONG TERM GOAL #4   Title Patient will be able to ascend and descend full flight of stairs with U railing, no  assistive device, and reciprocal pattern free of compensation strategies    Status On-going   PT LONG TERM GOAL #5   Title Patient will be unlimited in her ability to perform functional tasks  and chores around her home with R knee pain 0/10 and no assistive device, minimal rest breaks    Status Achieved               Plan - 02/12/15 1222    Clinical Impression Statement Resumed stair training with abiltiy to complete at 4in with noted weak eccentric quad control descending stairs.  Increased time manual today following repoorts of tightness.  Rt knee continues to have a lot of adhesions with patella mobilty, able to increase patela mobilty especially Rt to Lt following manual.  PROM complete per pt. tolerance, continues to be limited especially with flexion.     PT Next Visit Plan Continue focus on ROM, glut strength. Continue hurdles in preparation for stair training. Introduce stair training. Work on sit to Engineer, manufacturing systems. complete manual to decrease adhesions and continue PROM to increase flexion of  Rt knee.        Problem List Patient Active Problem List   Diagnosis Date Noted  . OA (osteoarthritis) of knee 12/13/2014   Aldona Lento, PTA  Aldona Lento 02/12/2015, 12:40 PM  [place] [address] [place], [place], [address] Phone: [phone]   Fax:  [phone]

## 2015-02-14 ENCOUNTER — Ambulatory Visit (HOSPITAL_COMMUNITY): Payer: Medicare Other | Admitting: Physical Therapy

## 2015-02-14 DIAGNOSIS — M24661 Ankylosis, right knee: Secondary | ICD-10-CM

## 2015-02-14 DIAGNOSIS — Z96651 Presence of right artificial knee joint: Secondary | ICD-10-CM | POA: Diagnosis not present

## 2015-02-14 DIAGNOSIS — M25661 Stiffness of right knee, not elsewhere classified: Secondary | ICD-10-CM | POA: Diagnosis not present

## 2015-02-14 DIAGNOSIS — M25561 Pain in right knee: Secondary | ICD-10-CM

## 2015-02-14 DIAGNOSIS — M6281 Muscle weakness (generalized): Secondary | ICD-10-CM | POA: Diagnosis not present

## 2015-02-14 DIAGNOSIS — R531 Weakness: Secondary | ICD-10-CM

## 2015-02-14 DIAGNOSIS — R609 Edema, unspecified: Secondary | ICD-10-CM

## 2015-02-14 DIAGNOSIS — M25461 Effusion, right knee: Secondary | ICD-10-CM | POA: Diagnosis not present

## 2015-02-14 DIAGNOSIS — Z471 Aftercare following joint replacement surgery: Secondary | ICD-10-CM | POA: Diagnosis not present

## 2015-02-14 NOTE — Therapy (Signed)
[place], [place], [address] Phone: [phone]   Fax:  [phone]  Physical Therapy Treatment  Patient Details  Name: [name] MRN: [id number] Date of Birth: 10-Sep-1940 Referring Provider:  Gaynelle Arabian, MD  Encounter Date: 02/14/2015      PT End of Session - 02/14/15 1202    Visit Number 25   Number of Visits 32   Date for PT Re-Evaluation 03/05/15   Authorization Type Medicare    Authorization Time Period 12/23/14 to 02/22/15; G-code done 10th visit    Authorization - Visit Number 25   Authorization - Number of Visits 30   PT Start Time 1013   PT Stop Time 1059   PT Time Calculation (min) 46 min   Activity Tolerance Patient tolerated treatment well   Behavior During Therapy [facility] for tasks assessed/performed      Past Medical History  Diagnosis Date  . Hypertension   . Hypothyroidism   . Arthritis   . Hyperlipemia   . Wears glasses   . Complication of anesthesia     woke up too early from a surgery  . Bladder spasms     "spastic bladder" wears pads  . GERD (gastroesophageal reflux disease)     occ. frequent "hiccoughs" after "eating, indigestion"  . Trigger finger, left     alternate fingers affected. -Occ. shooting pain.  . Cancer     Skin cancer -scalp and eyebrow"basal cell"    Past Surgical History  Procedure Laterality Date  . Total hip arthroplasty  2006    right  . Total hip arthroplasty  2006    left  . Tonsillectomy    . Appendectomy    . Carpal tunnel release Left   . Thyroidectomy  1994  . Cardiac catheterization  2006    normal-done for work up pre hip surgeries  . Cholecystectomy  1997  . Abdominal hysterectomy  1989  . Polypectomy Bilateral 06/26/2013    Procedure: BILATERAL EXCISION OF NASAL POLYPS ;  Surgeon: Ascencion Dike, MD;  Location: [place];  Service: ENT;  Laterality: Bilateral;  . Septoplasty N/A 06/26/2013    Procedure: AND  SEPTOPLASTY;  Surgeon: Ascencion Dike, MD;  Location: [place];  Service: ENT;  Laterality: N/A;  . Joint replacement      BTHA  . Total knee arthroplasty Right 12/13/2014    Procedure: RIGHT TOTAL KNEE ARTHROPLASTY;  Surgeon: Gaynelle Arabian, MD;  Location: [facility];  Service: Orthopedics;  Laterality: Right;    There were no vitals filed for this visit.  Visit Diagnosis:  Status post total right knee replacement - Plan: PT plan of care cert/re-cert  Knee pain, right - Plan: PT plan of care cert/re-cert  Edema - Plan: PT plan of care cert/re-cert  Weakness generalized - Plan: PT plan of care cert/re-cert  Decreased range of motion of knee, right - Plan: PT plan of care cert/re-cert      Subjective Assessment - 02/14/15 1015    Subjective patient reports she is having very little pain today, and even that did not start until she drove here this morning. Feels like she is doing very well. Reports she is continuing to attempt to walk on grass to challenge her balance without difficulty.    Pertinent History Knee replacement surgery was done on 12/13/14; had seen multiple orthopedic specialists. Had both hips replaced around 2006. Has known for at least 4 years  that both knees are bone on bone; finally got knee replacement scheduled.  Went to [facility] after the surgery.   Currently in Pain? Yes   Pain Score 2    Pain Location Knee   Pain Orientation Right                         [facility] Adult PT Treatment/Exercise - 02/14/15 0001    Knee/Hip Exercises: Stretches   Active Hamstring Stretch 3 reps;30 seconds   Active Hamstring Stretch Limitations 14 inch box    Quad Stretch 3 reps;30 seconds   Quad Stretch Limitations prone with rope    Knee: Self-Stretch Limitations knee drives 8 inch box 4T62 with 3 second holds    Press photographer 3 reps;30 seconds   Gastroc Stretch Limitations slant board   Knee/Hip Exercises: Standing   Forward Step Up Both;1 set;10 reps    Forward Step Up Limitations 6 inch box    Rocker Board Limitations x20 AP, x20 lateral, fingertip touch U HHA    Other Standing Knee Exercises Sit to stand from mat table at various heights with focus on sequencing 1x10   Other Standing Knee Exercises --   Manual Therapy   Other Manual Therapy scar mobilization, patella mobs all directions                PT Education - 02/14/15 1201    Education provided Yes   Education Details education to continue mobilizing distal scar, patella, and gentle massage over distal patellar tendon    Person(s) Educated Patient   Methods Explanation;Demonstration   Comprehension Verbalized understanding;Returned demonstration          PT Short Term Goals - 02/12/15 1236    PT SHORT TERM GOAL #1   Title Patient will demonstrate no less than 7 degrees extension and at least 90 degrees flexion in pain free range    Status Achieved   PT SHORT TERM GOAL #2   Title Patient will demonstrate at least 4/5 strength in bilateral lower extremities and all proximal muscle groups    Status Partially Met   PT SHORT TERM GOAL #3   Title Patient will be able to ambulate at least 45 minutes over even and uneven surfaces with single point cane and R knee pain no more than 2/10   Status Partially Met   PT SHORT TERM GOAL #4   Title Patient will be able to perform functional sit to stand transfer without the use of her upper extremities and R knee pain no more than 2/10   Status On-going   PT SHORT TERM GOAL #5   Title Patient to demonstrate reduced levels of edema in her R knee and will be able to successfully self-perform appropriate massage technique to reduce edema and patellar mobilizations safely on an independent basis    Status On-going           PT Long Term Goals - 02/12/15 1237    PT LONG TERM GOAL #1   Title Patient will be independent in advanced HEP and will be able to perform her exercise routine correctly on a consistent basis    Status  On-going   PT LONG TERM GOAL #2   Title Patient will demonstrate no more than 3 degrees R knee extension and will be able to perform R  knee flexion of at least 115 degrees    Status On-going   PT LONG TERM GOAL #3  Title Patient will be able to ambulate unlimited distances on level and unlevel surfaces with no assistive device, good balance, and minimal fall risk, R knee pain 0/10   Status On-going   PT LONG TERM GOAL #4   Title Patient will be able to ascend and descend full flight of stairs with U railing, no assistive device, and reciprocal pattern free of compensation strategies    Status On-going   PT LONG TERM GOAL #5   Title Patient will be unlimited in her ability to perform functional tasks and chores around her home with R knee pain 0/10 and no assistive device, minimal rest breaks    Status Achieved               Plan - 02/14/15 1203    Clinical Impression Statement Continued functional stretches and exercises with progression of forward steps ups today; also focused on sit to stand mechancis with focus on sequencing/form with mat table at different heights- sit to stand transfer continues to appear to be at least in part limited by R knee tightness. Cntinued manual today and encouraged patient to mobilize distal scar, patella, and distal patellar tendon throughout the day to assist in reducing adhesions.    Pt will benefit from skilled therapeutic intervention in order to improve on the following deficits Abnormal gait;Decreased coordination;Decreased range of motion;Difficulty walking;Impaired flexibility;Improper body mechanics;Decreased endurance;Postural dysfunction;Decreased activity tolerance;Decreased balance;Decreased knowledge of use of DME;Decreased scar mobility;Increased muscle spasms;Pain;Decreased mobility;Decreased strength   Rehab Potential Good   PT Frequency 3x / week   PT Duration 4 weeks   PT Treatment/Interventions ADLs/Self Care Home Management;Gait  training;Neuromuscular re-education;Stair training;Functional mobility training;Patient/family education;Cryotherapy;Therapeutic activities;Therapeutic exercise;Manual techniques;DME Instruction;Balance training   PT Next Visit Plan Continue focus on ROM, glut strength. Continue hurdles in preparation for stair training. Introduce stair training. Work on sit to Engineer, manufacturing systems. complete manual to decrease adhesions and continue PROM to increase flexion of  Rt knee.   Consulted and Agree with Plan of Care Patient        Problem List Patient Active Problem List   Diagnosis Date Noted  . OA (osteoarthritis) of knee 12/13/2014    Deniece Ree PT, DPT [place] [address] [place], [place], [address] Phone: [phone]   Fax:  [phone]

## 2015-02-18 ENCOUNTER — Ambulatory Visit (HOSPITAL_COMMUNITY): Payer: Medicare Other | Admitting: Physical Therapy

## 2015-02-18 DIAGNOSIS — M25561 Pain in right knee: Secondary | ICD-10-CM

## 2015-02-18 DIAGNOSIS — Z96651 Presence of right artificial knee joint: Secondary | ICD-10-CM

## 2015-02-18 DIAGNOSIS — Z471 Aftercare following joint replacement surgery: Secondary | ICD-10-CM | POA: Diagnosis not present

## 2015-02-18 DIAGNOSIS — M24661 Ankylosis, right knee: Secondary | ICD-10-CM

## 2015-02-18 DIAGNOSIS — R609 Edema, unspecified: Secondary | ICD-10-CM

## 2015-02-18 DIAGNOSIS — M25661 Stiffness of right knee, not elsewhere classified: Secondary | ICD-10-CM | POA: Diagnosis not present

## 2015-02-18 DIAGNOSIS — M25461 Effusion, right knee: Secondary | ICD-10-CM | POA: Diagnosis not present

## 2015-02-18 DIAGNOSIS — M6281 Muscle weakness (generalized): Secondary | ICD-10-CM | POA: Diagnosis not present

## 2015-02-18 NOTE — Therapy (Signed)
[place], [place], [address] Phone: [phone]   Fax:  [phone]  Physical Therapy Treatment  Patient Details  Name: [name] MRN: [id number] Date of Birth: [birth year] Referring Provider:  Gaynelle Arabian, MD  Encounter Date: 02/18/2015      PT End of Session - 02/18/15 0846    Visit Number 26   Number of Visits 32   Date for PT Re-Evaluation 03/05/15   Authorization Type Medicare    Authorization - Visit Number 26   Authorization - Number of Visits 30   PT Start Time 0802   PT Stop Time 0848   PT Time Calculation (min) 46 min   Equipment Utilized During Treatment Gait belt   Activity Tolerance Patient tolerated treatment well      Past Medical History  Diagnosis Date  . Hypertension   . Hypothyroidism   . Arthritis   . Hyperlipemia   . Wears glasses   . Complication of anesthesia     woke up too early from a surgery  . Bladder spasms     "spastic bladder" wears pads  . GERD (gastroesophageal reflux disease)     occ. frequent "hiccoughs" after "eating, indigestion"  . Trigger finger, left     alternate fingers affected. -Occ. shooting pain.  . Cancer     Skin cancer -scalp and eyebrow"basal cell"    Past Surgical History  Procedure Laterality Date  . Total hip arthroplasty  2006    right  . Total hip arthroplasty  2006    left  . Tonsillectomy    . Appendectomy    . Carpal tunnel release Left   . Thyroidectomy  1994  . Cardiac catheterization  2006    normal-done for work up pre hip surgeries  . Cholecystectomy  1997  . Abdominal hysterectomy  1989  . Polypectomy Bilateral 06/26/2013    Procedure: BILATERAL EXCISION OF NASAL POLYPS ;  Surgeon: Ascencion Dike, MD;  Location: [place];  Service: ENT;  Laterality: Bilateral;  . Septoplasty N/A 06/26/2013    Procedure: AND SEPTOPLASTY;  Surgeon: Ascencion Dike, MD;  Location: [place];  Service: ENT;   Laterality: N/A;  . Joint replacement      BTHA  . Total knee arthroplasty Right 12/13/2014    Procedure: RIGHT TOTAL KNEE ARTHROPLASTY;  Surgeon: Gaynelle Arabian, MD;  Location: [facility];  Service: Orthopedics;  Laterality: Right;    There were no vitals filed for this visit.  Visit Diagnosis:  Status post total right knee replacement  Knee pain, right  Edema  Decreased range of motion of knee, right      Subjective Assessment - 02/18/15 0815    Subjective Pt states she took a pain pill at 3:00 AM due to not being able to sleep.  Pt states that she still has difficulty getting out of chairs but it is getting easier.    Currently in Pain? No/denies                [facility] Adult PT Treatment/Exercise - 02/18/15 0001    Ambulation/Gait   Ambulation/Gait Yes   Gait Pattern Decreased stride length;Right foot flat;Left foot flat  worked on heel toe gt    Knee/Hip Exercises: Stretches   Sports administrator 3 reps;30 seconds   Quad Stretch Limitations prone with rope    Knee/Hip Exercises: Standing   Stairs 2 RT   Rocker Board 2 minutes  54mn a/p; 221m lateral   SLS with Vectors 3 x with 5 second hold    Other Standing Knee Exercises Sit to stand from mat table at lowest height.    Other Standing Knee Exercises step over 7" hurdles x 2 RT    Knee/Hip Exercises: Supine   Heel Slides 10 reps   Heel Slides Limitations 3-95   Knee Flexion PROM   Knee Flexion Limitations PROM to 98                  PT Short Term Goals - 02/12/15 1236    PT SHORT TERM GOAL #1   Title Patient will demonstrate no less than 7 degrees extension and at least 90 degrees flexion in pain free range    Status Achieved   PT SHORT TERM GOAL #2   Title Patient will demonstrate at least 4/5 strength in bilateral lower extremities and all proximal muscle groups    Status Partially Met   PT SHORT TERM GOAL #3   Title Patient will be able to ambulate at least 45 minutes over even and uneven surfaces with  single point cane and R knee pain no more than 2/10   Status Partially Met   PT SHORT TERM GOAL #4   Title Patient will be able to perform functional sit to stand transfer without the use of her upper extremities and R knee pain no more than 2/10   Status On-going   PT SHORT TERM GOAL #5   Title Patient to demonstrate reduced levels of edema in her R knee and will be able to successfully self-perform appropriate massage technique to reduce edema and patellar mobilizations safely on an independent basis    Status On-going           PT Long Term Goals - 02/12/15 1237    PT LONG TERM GOAL #1   Title Patient will be independent in advanced HEP and will be able to perform her exercise routine correctly on a consistent basis    Status On-going   PT LONG TERM GOAL #2   Title Patient will demonstrate no more than 3 degrees R knee extension and will be able to perform R  knee flexion of at least 115 degrees    Status On-going   PT LONG TERM GOAL #3   Title Patient will be able to ambulate unlimited distances on level and unlevel surfaces with no assistive device, good balance, and minimal fall risk, R knee pain 0/10   Status On-going   PT LONG TERM GOAL #4   Title Patient will be able to ascend and descend full flight of stairs with U railing, no assistive device, and reciprocal pattern free of compensation strategies    Status On-going   PT LONG TERM GOAL #5   Title Patient will be unlimited in her ability to perform functional tasks and chores around her home with R knee pain 0/10 and no assistive device, minimal rest breaks    Status Achieved               Plan - 02/18/15 0847    Clinical Impression Statement Added vector stances for improved balance.  Pt continues to walk flat footed worked on normalizing heel toe gt.  Added steps for power; balance and ROM.   PT Next Visit Plan Continue to focus on ROM and balance         Problem List Patient Active Problem List    Diagnosis Date Noted  .  OA (osteoarthritis) of knee 12/13/2014  Rayetta Humphrey, PT CLT [phone] 02/18/2015, 8:51 AM  [place], [place], [address] Phone: [phone]   Fax:  [phone]

## 2015-02-19 ENCOUNTER — Ambulatory Visit (HOSPITAL_COMMUNITY): Payer: Medicare Other | Admitting: Physical Therapy

## 2015-02-19 DIAGNOSIS — R531 Weakness: Secondary | ICD-10-CM

## 2015-02-19 DIAGNOSIS — M25461 Effusion, right knee: Secondary | ICD-10-CM | POA: Diagnosis not present

## 2015-02-19 DIAGNOSIS — M24661 Ankylosis, right knee: Secondary | ICD-10-CM

## 2015-02-19 DIAGNOSIS — M25561 Pain in right knee: Secondary | ICD-10-CM

## 2015-02-19 DIAGNOSIS — Z471 Aftercare following joint replacement surgery: Secondary | ICD-10-CM | POA: Diagnosis not present

## 2015-02-19 DIAGNOSIS — M25661 Stiffness of right knee, not elsewhere classified: Secondary | ICD-10-CM | POA: Diagnosis not present

## 2015-02-19 DIAGNOSIS — M6281 Muscle weakness (generalized): Secondary | ICD-10-CM | POA: Diagnosis not present

## 2015-02-19 DIAGNOSIS — Z96651 Presence of right artificial knee joint: Secondary | ICD-10-CM | POA: Diagnosis not present

## 2015-02-19 NOTE — Therapy (Signed)
[place], [place], [address] Phone: [phone]   Fax:  [phone]  Physical Therapy Treatment  Patient Details  Name: [name] MRN: [id number] Date of Birth: 09/19/1940 Referring Provider:  Gaynelle Arabian, MD  Encounter Date: 02/19/2015      PT End of Session - 02/19/15 1117    Visit Number 27   Number of Visits 32   Date for PT Re-Evaluation 03/05/15   Authorization Type Medicare    Authorization Time Period 12/23/14 to 02/22/15; G-code done 10th visit    Authorization - Visit Number 27   Authorization - Number of Visits 30   PT Start Time 0935   PT Stop Time 1027   PT Time Calculation (min) 52 min   Activity Tolerance Patient tolerated treatment well      Past Medical History  Diagnosis Date  . Hypertension   . Hypothyroidism   . Arthritis   . Hyperlipemia   . Wears glasses   . Complication of anesthesia     woke up too early from a surgery  . Bladder spasms     "spastic bladder" wears pads  . GERD (gastroesophageal reflux disease)     occ. frequent "hiccoughs" after "eating, indigestion"  . Trigger finger, left     alternate fingers affected. -Occ. shooting pain.  . Cancer     Skin cancer -scalp and eyebrow"basal cell"    Past Surgical History  Procedure Laterality Date  . Total hip arthroplasty  2006    right  . Total hip arthroplasty  2006    left  . Tonsillectomy    . Appendectomy    . Carpal tunnel release Left   . Thyroidectomy  1994  . Cardiac catheterization  2006    normal-done for work up pre hip surgeries  . Cholecystectomy  1997  . Abdominal hysterectomy  1989  . Polypectomy Bilateral 06/26/2013    Procedure: BILATERAL EXCISION OF NASAL POLYPS ;  Surgeon: Ascencion Dike, MD;  Location: [place];  Service: ENT;  Laterality: Bilateral;  . Septoplasty N/A 06/26/2013    Procedure: AND SEPTOPLASTY;  Surgeon: Ascencion Dike, MD;  Location: [place];  Service: ENT;  Laterality: N/A;  . Joint replacement      BTHA  . Total knee arthroplasty Right 12/13/2014    Procedure: RIGHT TOTAL KNEE ARTHROPLASTY;  Surgeon: Gaynelle Arabian, MD;  [facility]: [facility];  Service: Orthopedics;  Laterality: Right;    There were no vitals filed for this visit.  Visit Diagnosis:  Status post total right knee replacement  Knee pain, right  Decreased range of motion of knee, right  Weakness generalized      Subjective Assessment - 02/19/15 0941    Subjective Pt states that she sat at weight watcher class; she was sore from this and last treatment    Currently in Pain? No/denies                         [facility] Adult PT Treatment/Exercise - 02/19/15 0001    Knee/Hip Exercises: Stretches   Quad Stretch 3 reps;30 seconds   Quad Stretch Limitations prone with rope    Gastroc Stretch 3 reps;30 seconds   Gastroc Stretch Limitations slant board   Knee/Hip Exercises: Standing   Functional Squat 10 reps   Functional Squat Limitations STS at mat table with 1 HHA   Stairs 3 RT  Rocker Board 2 minutes   SLS with Vectors 3 x with 10 second hold   Other Standing Knee Exercises Place foot on 12 inch stool, lunge forward 5x30 seconds   Other Standing Knee Exercises marching in place x 10                PT Education - 02/19/15 1116    Education provided Yes   Education Details Pt educated on stretching, re-emphasized heel-toe ambulation   Person(s) Educated Patient   Methods Explanation   Comprehension Verbalized understanding          PT Short Term Goals - 02/12/15 1236    PT SHORT TERM GOAL #1   Title Patient will demonstrate no less than 7 degrees extension and at least 90 degrees flexion in pain free range    Status Achieved   PT SHORT TERM GOAL #2   Title Patient will demonstrate at least 4/5 strength in bilateral lower extremities and all proximal muscle groups    Status Partially Met   PT SHORT TERM GOAL #3    Title Patient will be able to ambulate at least 45 minutes over even and uneven surfaces with single point cane and R knee pain no more than 2/10   Status Partially Met   PT SHORT TERM GOAL #4   Title Patient will be able to perform functional sit to stand transfer without the use of her upper extremities and R knee pain no more than 2/10   Status On-going   PT SHORT TERM GOAL #5   Title Patient to demonstrate reduced levels of edema in her R knee and will be able to successfully self-perform appropriate massage technique to reduce edema and patellar mobilizations safely on an independent basis    Status On-going           PT Long Term Goals - 02/12/15 1237    PT LONG TERM GOAL #1   Title Patient will be independent in advanced HEP and will be able to perform her exercise routine correctly on a consistent basis    Status On-going   PT LONG TERM GOAL #2   Title Patient will demonstrate no more than 3 degrees R knee extension and will be able to perform R  knee flexion of at least 115 degrees    Status On-going   PT LONG TERM GOAL #3   Title Patient will be able to ambulate unlimited distances on level and unlevel surfaces with no assistive device, good balance, and minimal fall risk, R knee pain 0/10   Status On-going   PT LONG TERM GOAL #4   Title Patient will be able to ascend and descend full flight of stairs with U railing, no assistive device, and reciprocal pattern free of compensation strategies    Status On-going   PT LONG TERM GOAL #5   Title Patient will be unlimited in her ability to perform functional tasks and chores around her home with R knee pain 0/10 and no assistive device, minimal rest breaks    Status Achieved               Plan - 02/19/15 1119    Clinical Impression Statement Pt demonstrates improvements in heel-toe gait. Required verbal cueing and min assist to get Rt foot onto 12 inch step for lunge stretching. Pt demonstrates apprehension when  ascending/descending stairs, requires verbal cueing for confidence.    PT Next Visit Plan Continue with AAROM for Rt knee flexion, continue with  functional training        Problem List Patient Active Problem List   Diagnosis Date Noted  . OA (osteoarthritis) of knee 12/13/2014    Rayetta Humphrey, PT CLT [phone] 02/19/2015, 11:22 AM  [place], [place], [address] Phone: [phone]   Fax:  [phone]

## 2015-02-21 ENCOUNTER — Ambulatory Visit (HOSPITAL_COMMUNITY): Payer: Medicare Other

## 2015-02-21 DIAGNOSIS — M25661 Stiffness of right knee, not elsewhere classified: Secondary | ICD-10-CM | POA: Diagnosis not present

## 2015-02-21 DIAGNOSIS — Z96651 Presence of right artificial knee joint: Secondary | ICD-10-CM

## 2015-02-21 DIAGNOSIS — M24661 Ankylosis, right knee: Secondary | ICD-10-CM

## 2015-02-21 DIAGNOSIS — M6281 Muscle weakness (generalized): Secondary | ICD-10-CM | POA: Diagnosis not present

## 2015-02-21 DIAGNOSIS — R609 Edema, unspecified: Secondary | ICD-10-CM

## 2015-02-21 DIAGNOSIS — R531 Weakness: Secondary | ICD-10-CM

## 2015-02-21 DIAGNOSIS — M25561 Pain in right knee: Secondary | ICD-10-CM | POA: Diagnosis not present

## 2015-02-21 DIAGNOSIS — M25461 Effusion, right knee: Secondary | ICD-10-CM | POA: Diagnosis not present

## 2015-02-21 DIAGNOSIS — Z471 Aftercare following joint replacement surgery: Secondary | ICD-10-CM | POA: Diagnosis not present

## 2015-02-21 NOTE — Patient Instructions (Signed)
Continue HEP as planned.

## 2015-02-21 NOTE — Therapy (Signed)
[place], [place], [address] Phone: [phone]   Fax:  [phone]  Physical Therapy Treatment  Patient Details  Name: [name] MRN: [id number] Date of Birth: 1941/03/30 Referring Provider:  Gaynelle Arabian, MD  Encounter Date: 02/21/2015      PT End of Session - 02/21/15 1039    Visit Number 28   Number of Visits 32   Date for PT Re-Evaluation 03/05/15   Authorization Type Medicare    Authorization Time Period 12/23/14 to 02/22/15; G-code done 10th visit    Authorization - Visit Number 28   Authorization - Number of Visits 30   PT Start Time 0932   PT Stop Time 1012   PT Time Calculation (min) 40 min   Activity Tolerance Patient tolerated treatment well   Behavior During Therapy [facility] for tasks assessed/performed      Past Medical History  Diagnosis Date  . Hypertension   . Hypothyroidism   . Arthritis   . Hyperlipemia   . Wears glasses   . Complication of anesthesia     woke up too early from a surgery  . Bladder spasms     "spastic bladder" wears pads  . GERD (gastroesophageal reflux disease)     occ. frequent "hiccoughs" after "eating, indigestion"  . Trigger finger, left     alternate fingers affected. -Occ. shooting pain.  . Cancer     Skin cancer -scalp and eyebrow"basal cell"    Past Surgical History  Procedure Laterality Date  . Total hip arthroplasty  2006    right  . Total hip arthroplasty  2006    left  . Tonsillectomy    . Appendectomy    . Carpal tunnel release Left   . Thyroidectomy  1994  . Cardiac catheterization  2006    normal-done for work up pre hip surgeries  . Cholecystectomy  1997  . Abdominal hysterectomy  1989  . Polypectomy Bilateral 06/26/2013    Procedure: BILATERAL EXCISION OF NASAL POLYPS ;  Surgeon: Ascencion Dike, MD;  Location: [place];  Service: ENT;  Laterality: Bilateral;  . Septoplasty N/A 06/26/2013    Procedure: AND  SEPTOPLASTY;  Surgeon: Ascencion Dike, MD;  Location: [place];  Service: ENT;  Laterality: N/A;  . Joint replacement      BTHA  . Total knee arthroplasty Right 12/13/2014    Procedure: RIGHT TOTAL KNEE ARTHROPLASTY;  Surgeon: Gaynelle Arabian, MD;  Location: [facility];  Service: Orthopedics;  Laterality: Right;    There were no vitals filed for this visit.  Visit Diagnosis:  Status post total right knee replacement  Knee pain, right  Decreased range of motion of knee, right  Weakness generalized  Edema      Subjective Assessment - 02/21/15 0943    Subjective Things are going well. Community ambulation is going well. HEP suggestions from last visit, those with a sheet, were not sucessful.    Pertinent History Knee replacement surgery was done on 12/13/14; had seen multiple orthopedic specialists. Had both hips replaced around 2006. Has known for at least 4 years that both knees are bone on bone; finally got knee replacement scheduled.  Went to [employer] after the surgery.                         [facility] Adult PT Treatment/Exercise - 02/21/15 0946    Knee/Hip Exercises: Stretches  Knee: Self-Stretch Limitations knee drives 8 inch box [id number]     Gastroc Stretch 3 reps;30 seconds   Gastroc Stretch Limitations slant board   Knee/Hip Exercises: Standing   Forward Step Up Both;1 set;10 reps   Forward Step Up Limitations 8"inch   Functional Squat 10 reps   Functional Squat Limitations STS at mat table with 1 HHA   Other Standing Knee Exercises Place foot on 12 inch stool, lunge forward 5x30 seconds   Other Standing Knee Exercises marching in place x 10   Manual Therapy   Manual Therapy Myofascial release;Soft tissue mobilization  distal quads near patella, mostly medially.    Other Manual Therapy scar mobilization, patella mobs all directions                PT Education - 02/21/15 1039    Education provided No          PT Short Term Goals -  02/12/15 1236    PT SHORT TERM GOAL #1   Title Patient will demonstrate no less than 7 degrees extension and at least 90 degrees flexion in pain free range    Status Achieved   PT SHORT TERM GOAL #2   Title Patient will demonstrate at least 4/5 strength in bilateral lower extremities and all proximal muscle groups    Status Partially Met   PT SHORT TERM GOAL #3   Title Patient will be able to ambulate at least 45 minutes over even and uneven surfaces with single point cane and R knee pain no more than 2/10   Status Partially Met   PT SHORT TERM GOAL #4   Title Patient will be able to perform functional sit to stand transfer without the use of her upper extremities and R knee pain no more than 2/10   Status On-going   PT SHORT TERM GOAL #5   Title Patient to demonstrate reduced levels of edema in her R knee and will be able to successfully self-perform appropriate massage technique to reduce edema and patellar mobilizations safely on an independent basis    Status On-going           PT Long Term Goals - 02/12/15 1237    PT LONG TERM GOAL #1   Title Patient will be independent in advanced HEP and will be able to perform her exercise routine correctly on a consistent basis    Status On-going   PT LONG TERM GOAL #2   Title Patient will demonstrate no more than 3 degrees R knee extension and will be able to perform R  knee flexion of at least 115 degrees    Status On-going   PT LONG TERM GOAL #3   Title Patient will be able to ambulate unlimited distances on level and unlevel surfaces with no assistive device, good balance, and minimal fall risk, R knee pain 0/10   Status On-going   PT LONG TERM GOAL #4   Title Patient will be able to ascend and descend full flight of stairs with U railing, no assistive device, and reciprocal pattern free of compensation strategies    Status On-going   PT LONG TERM GOAL #5   Title Patient will be unlimited in her ability to perform functional tasks and  chores around her home with R knee pain 0/10 and no assistive device, minimal rest breaks    Status Achieved               Plan - 02/21/15 1040  Clinical Impression Statement Pt progressing well toward goals as evdienced by improved community ambulation with decreased level of assistance required. Knee mobility and capsular tightness continues to be th egreatest limiting factor toward progress, however pt remains motivated  to continue progress through HEP and subsequent visits. Pt wil continue to benefit from skilled intervention to adress the above impairments and to continue progress toward PLOF in mobility.    Pt will benefit from skilled therapeutic intervention in order to improve on the following deficits Abnormal gait;Decreased coordination;Decreased range of motion;Difficulty walking;Impaired flexibility;Improper body mechanics;Decreased endurance;Postural dysfunction;Decreased activity tolerance;Decreased balance;Decreased knowledge of use of DME;Decreased scar mobility;Increased muscle spasms;Pain;Decreased mobility;Decreased strength   Rehab Potential Good   PT Frequency 3x / week   PT Duration 4 weeks   PT Treatment/Interventions ADLs/Self Care Home Management;Gait training;Neuromuscular re-education;Stair training;Functional mobility training;Patient/family education;Cryotherapy;Therapeutic activities;Therapeutic exercise;Manual techniques;DME Instruction;Balance training   PT Next Visit Plan Continue with AAROM for Rt knee flexion, continue with functional training   PT Home Exercise Plan No changes.         Problem List Patient Active Problem List   Diagnosis Date Noted  . OA (osteoarthritis) of knee 12/13/2014    Stratton Villwock C 02/21/2015, 10:46 AM  10:46 AM  Etta Grandchild, PT, DPT [place] License # [id number]       [facility] [address] [place], [place], [address] Phone: [phone]   Fax:   [phone]

## 2015-02-24 ENCOUNTER — Ambulatory Visit (HOSPITAL_COMMUNITY): Payer: Medicare Other | Admitting: Physical Therapy

## 2015-02-24 ENCOUNTER — Encounter (HOSPITAL_COMMUNITY): Payer: 59 | Admitting: Physical Therapy

## 2015-02-24 DIAGNOSIS — M25661 Stiffness of right knee, not elsewhere classified: Secondary | ICD-10-CM | POA: Diagnosis not present

## 2015-02-24 DIAGNOSIS — Z96651 Presence of right artificial knee joint: Secondary | ICD-10-CM

## 2015-02-24 DIAGNOSIS — M25461 Effusion, right knee: Secondary | ICD-10-CM | POA: Diagnosis not present

## 2015-02-24 DIAGNOSIS — M25561 Pain in right knee: Secondary | ICD-10-CM | POA: Diagnosis not present

## 2015-02-24 DIAGNOSIS — R531 Weakness: Secondary | ICD-10-CM

## 2015-02-24 DIAGNOSIS — M24661 Ankylosis, right knee: Secondary | ICD-10-CM

## 2015-02-24 DIAGNOSIS — M6281 Muscle weakness (generalized): Secondary | ICD-10-CM | POA: Diagnosis not present

## 2015-02-24 DIAGNOSIS — Z471 Aftercare following joint replacement surgery: Secondary | ICD-10-CM | POA: Diagnosis not present

## 2015-02-24 NOTE — Therapy (Signed)
[place], [place], [address] Phone: [phone]   Fax:  [phone]  Physical Therapy Treatment  Patient Details  Name: [name] MRN: [id number] Date of Birth: [birth year] Referring Provider:  Gaynelle Arabian, MD  Encounter Date: 02/24/2015      PT End of Session - 02/24/15 1540    Visit Number 29   Number of Visits 32   Date for PT Re-Evaluation 03/05/15   Authorization Type Medicare    Authorization Time Period 12/23/14 to 02/22/15; G-code done 10th visit    Authorization - Visit Number 29   Authorization - Number of Visits 30   PT Start Time 6301   PT Stop Time 1431   PT Time Calculation (min) 39 min   Activity Tolerance Patient tolerated treatment well      Past Medical History  Diagnosis Date  . Hypertension   . Hypothyroidism   . Arthritis   . Hyperlipemia   . Wears glasses   . Complication of anesthesia     woke up too early from a surgery  . Bladder spasms     "spastic bladder" wears pads  . GERD (gastroesophageal reflux disease)     occ. frequent "hiccoughs" after "eating, indigestion"  . Trigger finger, left     alternate fingers affected. -Occ. shooting pain.  . Cancer     Skin cancer -scalp and eyebrow"basal cell"    Past Surgical History  Procedure Laterality Date  . Total hip arthroplasty  2006    right  . Total hip arthroplasty  2006    left  . Tonsillectomy    . Appendectomy    . Carpal tunnel release Left   . Thyroidectomy  1994  . Cardiac catheterization  2006    normal-done for work up pre hip surgeries  . Cholecystectomy  1997  . Abdominal hysterectomy  1989  . Polypectomy Bilateral 06/26/2013    Procedure: BILATERAL EXCISION OF NASAL POLYPS ;  Surgeon: Ascencion Dike, MD;  Location: [place];  Service: ENT;  Laterality: Bilateral;  . Septoplasty N/A 06/26/2013    Procedure: AND SEPTOPLASTY;  Surgeon: Ascencion Dike, MD;  Location: [facility] [place];  Service: ENT;  Laterality: N/A;  . Joint replacement      BTHA  . Total knee arthroplasty Right 12/13/2014    Procedure: RIGHT TOTAL KNEE ARTHROPLASTY;  Surgeon: Gaynelle Arabian, MD;  Location: [facility];  Service: Orthopedics;  Laterality: Right;    There were no vitals filed for this visit.  Visit Diagnosis:  Status post total right knee replacement  Knee pain, right  Decreased range of motion of knee, right  Weakness generalized      Subjective Assessment - 02/24/15 1400    Subjective Pt denies any pain today. She was able to come up the curb in the parking lot instead of using the ramp. She still has difficulty ascending and descending stairs due to weakness in her Rt leg.   Currently in Pain? No/denies             [facility] Adult PT Treatment/Exercise - 02/24/15 0001    Knee/Hip Exercises: Stretches   Gastroc Stretch 3 reps;30 seconds   Gastroc Stretch Limitations slant board   Knee/Hip Exercises: Standing   Step Down 10 reps;Step Height: 6"   Functional Squat 10 reps   Functional Squat Limitations STS at mat table with 1 HHA   Stairs 3 RT  Rocker Board 2 minutes   Other Standing Knee Exercises Place foot on 12 inch stool, lunge forward 5x30 seconds   Other Standing Knee Exercises marching over cones- 12 cones x 1 RT            PT Education - 02/24/15 1537    Education provided Yes   Education Details Pt educated on proper technique with ascending/descending stairs.    Person(s) Educated Patient   Methods Explanation   Comprehension Verbalized understanding          PT Short Term Goals - 02/12/15 1236    PT SHORT TERM GOAL #1   Title Patient will demonstrate no less than 7 degrees extension and at least 90 degrees flexion in pain free range    Status Achieved   PT SHORT TERM GOAL #2   Title Patient will demonstrate at least 4/5 strength in bilateral lower extremities and all proximal muscle groups    Status Partially Met   PT SHORT TERM GOAL #3    Title Patient will be able to ambulate at least 45 minutes over even and uneven surfaces with single point cane and R knee pain no more than 2/10   Status Partially Met   PT SHORT TERM GOAL #4   Title Patient will be able to perform functional sit to stand transfer without the use of her upper extremities and R knee pain no more than 2/10   Status On-going   PT SHORT TERM GOAL #5   Title Patient to demonstrate reduced levels of edema in her R knee and will be able to successfully self-perform appropriate massage technique to reduce edema and patellar mobilizations safely on an independent basis    Status On-going           PT Long Term Goals - 02/12/15 1237    PT LONG TERM GOAL #1   Title Patient will be independent in advanced HEP and will be able to perform her exercise routine correctly on a consistent basis    Status On-going   PT LONG TERM GOAL #2   Title Patient will demonstrate no more than 3 degrees R knee extension and will be able to perform R  knee flexion of at least 115 degrees    Status On-going   PT LONG TERM GOAL #3   Title Patient will be able to ambulate unlimited distances on level and unlevel surfaces with no assistive device, good balance, and minimal fall risk, R knee pain 0/10   Status On-going   PT LONG TERM GOAL #4   Title Patient will be able to ascend and descend full flight of stairs with U railing, no assistive device, and reciprocal pattern free of compensation strategies    Status On-going   PT LONG TERM GOAL #5   Title Patient will be unlimited in her ability to perform functional tasks and chores around her home with R knee pain 0/10 and no assistive device, minimal rest breaks    Status Achieved            Plan - 02/24/15 1541    Clinical Impression Statement Pt responded well to addition of forward step downs from 6 inch step. She required min assist to complete stepping over cones, as well as verbal cueing to avoid circumduction of the hip as  a compensation during forward lunges on 12 inch step and when stepping over ccnes.    PT Next Visit Plan Continue with forward step downs, AAROM and AROM  for knee flexion.         Problem List Patient Active Problem List   Diagnosis Date Noted  . OA (osteoarthritis) of knee 12/13/2014    Hilma Favors, PT, DPT [phone] 02/24/2015, 3:44 PM  [place], [place], [address] Phone: [phone]   Fax:  [phone]

## 2015-02-26 ENCOUNTER — Ambulatory Visit (HOSPITAL_COMMUNITY): Payer: Medicare Other | Admitting: Physical Therapy

## 2015-02-26 DIAGNOSIS — Z471 Aftercare following joint replacement surgery: Secondary | ICD-10-CM | POA: Diagnosis not present

## 2015-02-26 DIAGNOSIS — M24661 Ankylosis, right knee: Secondary | ICD-10-CM

## 2015-02-26 DIAGNOSIS — M6281 Muscle weakness (generalized): Secondary | ICD-10-CM | POA: Diagnosis not present

## 2015-02-26 DIAGNOSIS — R531 Weakness: Secondary | ICD-10-CM

## 2015-02-26 DIAGNOSIS — M25661 Stiffness of right knee, not elsewhere classified: Secondary | ICD-10-CM | POA: Diagnosis not present

## 2015-02-26 DIAGNOSIS — M25561 Pain in right knee: Secondary | ICD-10-CM

## 2015-02-26 DIAGNOSIS — Z96651 Presence of right artificial knee joint: Secondary | ICD-10-CM | POA: Diagnosis not present

## 2015-02-26 DIAGNOSIS — M25461 Effusion, right knee: Secondary | ICD-10-CM | POA: Diagnosis not present

## 2015-02-26 NOTE — Therapy (Signed)
[place], [place], [address] Phone: [phone]   Fax:  [phone]  Physical Therapy Treatment  Patient Details  Name: [name] MRN: [id number] Date of Birth: 1941/01/24 Referring Provider:  Sharilyn Sites, MD  Encounter Date: 02/26/2015      PT End of Session - 02/26/15 0948    Visit Number 30   Number of Visits 39   Date for PT Re-Evaluation 03/19/15   Authorization Type Medicare    Authorization - Visit Number 60   Authorization - Number of Visits 23   PT Start Time 3810   PT Stop Time 0952   PT Time Calculation (min) 60 min   Activity Tolerance Patient tolerated treatment well      Past Medical History  Diagnosis Date  . Hypertension   . Hypothyroidism   . Arthritis   . Hyperlipemia   . Wears glasses   . Complication of anesthesia     woke up too early from a surgery  . Bladder spasms     "spastic bladder" wears pads  . GERD (gastroesophageal reflux disease)     occ. frequent "hiccoughs" after "eating, indigestion"  . Trigger finger, left     alternate fingers affected. -Occ. shooting pain.  . Cancer     Skin cancer -scalp and eyebrow"basal cell"    Past Surgical History  Procedure Laterality Date  . Total hip arthroplasty  2006    right  . Total hip arthroplasty  2006    left  . Tonsillectomy    . Appendectomy    . Carpal tunnel release Left   . Thyroidectomy  1994  . Cardiac catheterization  2006    normal-done for work up pre hip surgeries  . Cholecystectomy  1997  . Abdominal hysterectomy  1989  . Polypectomy Bilateral 06/26/2013    Procedure: BILATERAL EXCISION OF NASAL POLYPS ;  Surgeon: Ascencion Dike, MD;  Location: [place];  Service: ENT;  Laterality: Bilateral;  . Septoplasty N/A 06/26/2013    Procedure: AND SEPTOPLASTY;  Surgeon: Ascencion Dike, MD;  Location: [place];  Service: ENT;  Laterality: N/A;  . Joint replacement      BTHA  .  Total knee arthroplasty Right 12/13/2014    Procedure: RIGHT TOTAL KNEE ARTHROPLASTY;  Surgeon: Gaynelle Arabian, MD;  Location: [facility];  Service: Orthopedics;  Laterality: Right;    There were no vitals filed for this visit.  Visit Diagnosis:  Status post total right knee replacement  Knee pain, right  Decreased range of motion of knee, right  Weakness generalized      Subjective Assessment - 02/26/15 0846    Subjective I didn't take any pain medication today.  I could do the bicycle at the chiropractor yesterday.    Currently in Pain? Yes   Pain Score 2    Pain Location Knee   Pain Orientation Right            [facility] PT Assessment - 02/26/15 0001    Assessment   Medical Diagnosis R total knee replacement    Onset Date/Surgical Date 12/13/14   Next MD Visit [place] 03/14/2015   Observation/Other Assessments   Focus on Therapeutic Outcomes (FOTO)  49% limitied   Functional Tests   Functional tests Single leg stance   Single Leg Stance   Comments Rt 3 seconds    AROM   Right Knee Extension 2   Right Knee  Flexion 92  was 85   PROM   Overall PROM Comments 0-95   Strength   Right Hip Extension 4-/5  Range limited by tight hip flexors   Right Hip ABduction --  5-/5 was 4+/5   Right Knee Flexion 4/5  was 4/5   Right Knee Extension 5/5  was 4+/5              [facility] Adult PT Treatment/Exercise - 02/26/15 0001    Knee/Hip Exercises: Aerobic   Stationary Bike rocking x 10'   Knee/Hip Exercises: Standing   Heel Raises 10 reps   Heel Raises Limitations Rt only    Stairs 3 RT both small and large steps    Rocker Board 2 minutes   SLS 4 second max    SLS with Vectors 5 x with 10 second hold   Other Standing Knee Exercises Place foot on 12 inch stool, lunge forward 5x30 seconds   Knee/Hip Exercises: Supine   Quad Sets 10 reps   Heel Slides 10 reps   Knee Flexion PROM   Knee Flexion Limitations PROM                PT Education - 02/26/15 0947     Education provided Yes   Education Details importance of continuting to ice; heel toe gt    Person(s) Educated Patient   Methods Explanation   Comprehension Verbalized understanding;Returned demonstration          PT Short Term Goals - 02/26/15 7673    PT SHORT TERM GOAL #1   Title Patient will demonstrate no less than 7 degrees extension and at least 90 degrees flexion in pain free range    Status Achieved   PT SHORT TERM GOAL #2   Title Patient will demonstrate at least 4/5 strength in bilateral lower extremities and all proximal muscle groups    Time 2   Status Partially Met   PT SHORT TERM GOAL #3   Title Patient will be able to ambulate at least 45 minutes over even and uneven surfaces with single point cane and R knee pain no more than 2/10   Baseline Pt is walking on uneven ground for about 30 minutes.    Time 2   Period Weeks   Status On-going   PT SHORT TERM GOAL #4   Title Patient will be able to perform functional sit to stand transfer without the use of her upper extremities and R knee pain no more than 2/10   Time 2   Period Weeks   Status Achieved   PT SHORT TERM GOAL #5   Title Patient to demonstrate reduced levels of edema in her R knee and will be able to successfully self-perform appropriate massage technique to reduce edema and patellar mobilizations safely on an independent basis    Time 2   Status Achieved           PT Long Term Goals - 02/26/15 0954    PT LONG TERM GOAL #1   Title Patient will be independent in advanced HEP and will be able to perform her exercise routine correctly on a consistent basis    Baseline 6/1- patient states that she is doing them, just not 3 times a day    Time 4   Period Weeks   Status Achieved   PT LONG TERM GOAL #2   Title Patient will demonstrate no more than 3 degrees R knee extension and will be able to perform  R  knee flexion of at least 115 degrees    Baseline March 27, 2015:  2-92 active    Time 4   Period Weeks    Status On-going   PT LONG TERM GOAL #3   Title Patient will be able to ambulate unlimited distances on level and unlevel surfaces with no assistive device, good balance, and minimal fall risk, R knee pain 0/10   Time 4   Period Weeks   Status On-going   PT LONG TERM GOAL #4   Title Patient will be able to ascend and descend full flight of stairs with U railing, no assistive device, and reciprocal pattern free of compensation strategies    Time 4   Period Weeks   Status On-going   PT LONG TERM GOAL #5   Title Patient will be unlimited in her ability to perform functional tasks and chores around her home with R knee pain 0/10 and no assistive device, minimal rest breaks    Time 4   Period Weeks   Status On-going               Plan - 2015/03/27 0949    Clinical Impression Statement Pt continues to gain in her ROM, strength and balance  at a slow but steady pace.  Pt still has difficulty picking items off the floor.  Pt will benefit from continued skilled PT to focus on functional bending of knee.  Recommend continuing 3x week x 3 more weeks    Pt will benefit from skilled therapeutic intervention in order to improve on the following deficits Abnormal gait;Decreased coordination;Decreased range of motion;Difficulty walking;Impaired flexibility;Improper body mechanics;Decreased endurance;Postural dysfunction;Decreased activity tolerance;Decreased balance;Decreased knowledge of use of DME;Decreased scar mobility;Increased muscle spasms;Pain;Decreased mobility;Decreased strength   Rehab Potential Good   PT Frequency 3x / week   PT Duration 3 weeks  for a total of 13 weeks    PT Treatment/Interventions ADLs/Self Care Home Management;Gait training;Neuromuscular re-education;Stair training;Functional mobility training;Patient/family education;Cryotherapy;Therapeutic activities;Therapeutic exercise;Manual techniques;DME Instruction;Balance training   PT Next Visit Plan Aggressive PROM  decrease steps to 2 Rt to allow time for functional squatting with goal of getting to floor.    Consulted and Agree with Plan of Care Patient          G-Codes - 03-27-2015 0956    Functional Assessment Tool Used foto   Functional Limitation Mobility: Walking and moving around   Mobility: Walking and Moving Around Current Status [phone]) At least 40 percent but less than 60 percent impaired, limited or restricted   Mobility: Walking and Moving Around Goal Status [phone]) At least 20 percent but less than 40 percent impaired, limited or restricted      Problem List Patient Active Problem List   Diagnosis Date Noted  . OA (osteoarthritis) of knee 12/13/2014   Rayetta Humphrey, PT CLT [id number] 27-Mar-2015, 9:57 AM  [place], [place], [address] Phone: [phone]   Fax:  [phone]

## 2015-02-28 ENCOUNTER — Ambulatory Visit (HOSPITAL_COMMUNITY): Payer: Medicare Other | Admitting: Physical Therapy

## 2015-02-28 DIAGNOSIS — R531 Weakness: Secondary | ICD-10-CM

## 2015-02-28 DIAGNOSIS — M25561 Pain in right knee: Secondary | ICD-10-CM

## 2015-02-28 DIAGNOSIS — M24661 Ankylosis, right knee: Secondary | ICD-10-CM

## 2015-02-28 DIAGNOSIS — M25461 Effusion, right knee: Secondary | ICD-10-CM | POA: Diagnosis not present

## 2015-02-28 DIAGNOSIS — Z96651 Presence of right artificial knee joint: Secondary | ICD-10-CM | POA: Diagnosis not present

## 2015-02-28 DIAGNOSIS — Z471 Aftercare following joint replacement surgery: Secondary | ICD-10-CM | POA: Diagnosis not present

## 2015-02-28 DIAGNOSIS — M25661 Stiffness of right knee, not elsewhere classified: Secondary | ICD-10-CM | POA: Diagnosis not present

## 2015-02-28 DIAGNOSIS — M6281 Muscle weakness (generalized): Secondary | ICD-10-CM | POA: Diagnosis not present

## 2015-02-28 NOTE — Therapy (Signed)
[place], [place], [address] Phone: [phone]   Fax:  [phone]  Physical Therapy Treatment  Patient Details  Name: [name] MRN: [id number] Date of Birth: August 23, 1941 Referring Provider:  Sharilyn Sites, MD  Encounter Date: 02/28/2015      PT End of Session - 02/28/15 1111    Visit Number 31   Number of Visits 39   Date for PT Re-Evaluation 03/19/15   Authorization Type Medicare    Authorization Time Period 12/23/14 to 02/22/15; G-code done 10th visit    Authorization - Visit Number 31   Authorization - Number of Visits 39   PT Start Time 1018   PT Stop Time 1100   PT Time Calculation (min) 42 min   Activity Tolerance Patient tolerated treatment well   Behavior During Therapy [facility] for tasks assessed/performed      Past Medical History  Diagnosis Date  . Hypertension   . Hypothyroidism   . Arthritis   . Hyperlipemia   . Wears glasses   . Complication of anesthesia     woke up too early from a surgery  . Bladder spasms     "spastic bladder" wears pads  . GERD (gastroesophageal reflux disease)     occ. frequent "hiccoughs" after "eating, indigestion"  . Trigger finger, left     alternate fingers affected. -Occ. shooting pain.  . Cancer     Skin cancer -scalp and eyebrow"basal cell"    Past Surgical History  Procedure Laterality Date  . Total hip arthroplasty  2006    right  . Total hip arthroplasty  2006    left  . Tonsillectomy    . Appendectomy    . Carpal tunnel release Left   . Thyroidectomy  1994  . Cardiac catheterization  2006    normal-done for work up pre hip surgeries  . Cholecystectomy  1997  . Abdominal hysterectomy  1989  . Polypectomy Bilateral 06/26/2013    Procedure: BILATERAL EXCISION OF NASAL POLYPS ;  Surgeon: Ascencion Dike, MD;  Location: [place];  Service: ENT;  Laterality: Bilateral;  . Septoplasty N/A 06/26/2013    Procedure: AND SEPTOPLASTY;   Surgeon: Ascencion Dike, MD;  Location: [place];  Service: ENT;  Laterality: N/A;  . Joint replacement      BTHA  . Total knee arthroplasty Right 12/13/2014    Procedure: RIGHT TOTAL KNEE ARTHROPLASTY;  Surgeon: Gaynelle Arabian, MD;  Location: [facility];  Service: Orthopedics;  Laterality: Right;    There were no vitals filed for this visit.  Visit Diagnosis:  Status post total right knee replacement  Knee pain, right  Decreased range of motion of knee, right  Weakness generalized      Subjective Assessment - 02/28/15 1023    Subjective Pt reports that she is having increased pain today, she did not take a pain pill. She was able to walk out and get her paper today without difficulty.    Pain Score 6    Pain Location Knee   Pain Orientation Right               [facility] Adult PT Treatment/Exercise - 02/28/15 0001    Knee/Hip Exercises: Stretches   Knee: Self-Stretch Limitations knee drives 12 inch step 3 x 30 seconds   Knee/Hip Exercises: Standing   Functional Squat 10 reps   Functional Squat Limitations STS at mat table with no hand assist  Stairs 2 RT   Rocker Board 2 minutes   Other Standing Knee Exercises Functional squatting to touch 18 inch box   Knee/Hip Exercises: Supine   Quad Sets 10 reps   Heel Slides 10 reps   Heel Slides Limitations 4-95   Knee Flexion PROM  PROM with tibiofemoral distraction                PT Education - 02/28/15 1110    Education provided Yes   Education Details Continueing with AROM for knee flexion   Person(s) Educated Patient   Methods Explanation   Comprehension Verbalized understanding          PT Short Term Goals - 02/26/15 3419    PT SHORT TERM GOAL #1   Title Patient will demonstrate no less than 7 degrees extension and at least 90 degrees flexion in pain free range    Status Achieved   PT SHORT TERM GOAL #2   Title Patient will demonstrate at least 4/5 strength in bilateral lower extremities and  all proximal muscle groups    Time 2   Status Partially Met   PT SHORT TERM GOAL #3   Title Patient will be able to ambulate at least 45 minutes over even and uneven surfaces with single point cane and R knee pain no more than 2/10   Baseline Pt is walking on uneven ground for about 30 minutes.    Time 2   Period Weeks   Status On-going   PT SHORT TERM GOAL #4   Title Patient will be able to perform functional sit to stand transfer without the use of her upper extremities and R knee pain no more than 2/10   Time 2   Period Weeks   Status Achieved   PT SHORT TERM GOAL #5   Title Patient to demonstrate reduced levels of edema in her R knee and will be able to successfully self-perform appropriate massage technique to reduce edema and patellar mobilizations safely on an independent basis    Time 2   Status Achieved           PT Long Term Goals - 02/26/15 0954    PT LONG TERM GOAL #1   Title Patient will be independent in advanced HEP and will be able to perform her exercise routine correctly on a consistent basis    Baseline 6/1- patient states that she is doing them, just not 3 times a day    Time 4   Period Weeks   Status Achieved   PT LONG TERM GOAL #2   Title Patient will demonstrate no more than 3 degrees R knee extension and will be able to perform R  knee flexion of at least 115 degrees    Baseline 02/26/2015:  2-92 active    Time 4   Period Weeks   Status On-going   PT LONG TERM GOAL #3   Title Patient will be able to ambulate unlimited distances on level and unlevel surfaces with no assistive device, good balance, and minimal fall risk, R knee pain 0/10   Time 4   Period Weeks   Status On-going   PT LONG TERM GOAL #4   Title Patient will be able to ascend and descend full flight of stairs with U railing, no assistive device, and reciprocal pattern free of compensation strategies    Time 4   Period Weeks   Status On-going   PT LONG TERM GOAL #5   Title Patient  will  be unlimited in her ability to perform functional tasks and chores around her home with R knee pain 0/10 and no assistive device, minimal rest breaks    Time 4   Period Weeks   Status On-going               Plan - 02/28/15 1112    Clinical Impression Statement Pt continues to show ROM deficits. She required tactile and verbal cueing for functional squats, and was unable to reach any lower than 18 inches. She will benefit from continuation of PROM to improve knee flexion. As of now, she is only achieving  95 degrees of knee flexion.    PT Next Visit Plan Continue with aggressive PROM to increase knee flexion.         Problem List Patient Active Problem List   Diagnosis Date Noted  . OA (osteoarthritis) of knee 12/13/2014    Hilma Favors, PT, DPT [phone] 02/28/2015, 11:18 AM  [place], [place], [address] Phone: [phone]   Fax:  [phone]

## 2015-03-03 ENCOUNTER — Ambulatory Visit (HOSPITAL_COMMUNITY): Payer: Medicare Other | Admitting: Physical Therapy

## 2015-03-03 DIAGNOSIS — R531 Weakness: Secondary | ICD-10-CM

## 2015-03-03 DIAGNOSIS — Z96651 Presence of right artificial knee joint: Secondary | ICD-10-CM | POA: Diagnosis not present

## 2015-03-03 DIAGNOSIS — Z471 Aftercare following joint replacement surgery: Secondary | ICD-10-CM | POA: Diagnosis not present

## 2015-03-03 DIAGNOSIS — M25561 Pain in right knee: Secondary | ICD-10-CM | POA: Diagnosis not present

## 2015-03-03 DIAGNOSIS — M24661 Ankylosis, right knee: Secondary | ICD-10-CM

## 2015-03-03 DIAGNOSIS — M25661 Stiffness of right knee, not elsewhere classified: Secondary | ICD-10-CM | POA: Diagnosis not present

## 2015-03-03 DIAGNOSIS — M6281 Muscle weakness (generalized): Secondary | ICD-10-CM | POA: Diagnosis not present

## 2015-03-03 DIAGNOSIS — R609 Edema, unspecified: Secondary | ICD-10-CM

## 2015-03-03 DIAGNOSIS — M25461 Effusion, right knee: Secondary | ICD-10-CM | POA: Diagnosis not present

## 2015-03-03 NOTE — Therapy (Signed)
[place], [place], [address] Phone: [phone]   Fax:  [phone]  Physical Therapy Treatment  Patient Details  Name: [name] MRN: [id number] Date of Birth: [birth year] Referring Provider:  Gaynelle Arabian, MD  Encounter Date: 03/03/2015      PT End of Session - 03/03/15 [place]    Visit Number 32   Number of Visits 39   Date for PT Re-Evaluation 03/19/15   Authorization Type Medicare    Authorization Time Period 02/23/15 to 04/25/15;     Authorization - Visit Number 32   Authorization - Number of Visits 39   PT Start Time 4481   PT Stop Time 1350   PT Time Calculation (min) 46 min   Activity Tolerance Patient tolerated treatment well   Behavior During Therapy WFL for tasks assessed/performed      Past Medical History  Diagnosis Date  . Hypertension   . Hypothyroidism   . Arthritis   . Hyperlipemia   . Wears glasses   . Complication of anesthesia     woke up too early from a surgery  . Bladder spasms     "spastic bladder" wears pads  . GERD (gastroesophageal reflux disease)     occ. frequent "hiccoughs" after "eating, indigestion"  . Trigger finger, left     alternate fingers affected. -Occ. shooting pain.  . Cancer     Skin cancer -scalp and eyebrow"basal cell"    Past Surgical History  Procedure Laterality Date  . Total hip arthroplasty  2006    right  . Total hip arthroplasty  2006    left  . Tonsillectomy    . Appendectomy    . Carpal tunnel release Left   . Thyroidectomy  1994  . Cardiac catheterization  2006    normal-done for work up pre hip surgeries  . Cholecystectomy  1997  . Abdominal hysterectomy  1989  . Polypectomy Bilateral 06/26/2013    Procedure: BILATERAL EXCISION OF NASAL POLYPS ;  Surgeon: Ascencion Dike, MD;  Location: [place];  Service: ENT;  Laterality: Bilateral;  . Septoplasty N/A 06/26/2013    Procedure: AND SEPTOPLASTY;  Surgeon: Ascencion Dike, MD;  Location: [place];  Service: ENT;  Laterality: N/A;  . Joint replacement      BTHA  . Total knee arthroplasty Right 12/13/2014    Procedure: RIGHT TOTAL KNEE ARTHROPLASTY;  Surgeon: Gaynelle Arabian, MD;  Location: [facility];  Service: Orthopedics;  Laterality: Right;    There were no vitals filed for this visit.  Visit Diagnosis:  Status post total right knee replacement  Knee pain, right  Decreased range of motion of knee, right  Weakness generalized  Edema      Subjective Assessment - 03/03/15 1828    Subjective Pt reports she is not hurting and worse than last visit and has been concentrating more on her walking.    Currently in Pain? Yes   Pain Score 4    Pain Location Knee   Pain Orientation Right                         [facility] Adult PT Treatment/Exercise - 03/03/15 1308    Knee/Hip Exercises: Stretches   Knee: Self-Stretch to increase Flexion Right;10 seconds   Knee: Self-Stretch Limitations 10 X on 12" box   Gastroc Stretch 3 reps;30 seconds   Gastroc Stretch Limitations slant board  Knee/Hip Exercises: Aerobic   Stationary Bike rocking x 10' seat 10   Knee/Hip Exercises: Supine   Heel Slides 10 reps   Heel Slides Limitations 4-98   Knee Flexion PROM   Knee Flexion Limitations 100 degrees   Manual Therapy   Manual Therapy Myofascial release;Soft tissue mobilization   Manual therapy comments and soft tissue work to Rt knee prior to PROM                  PT Short Term Goals - 02/26/15 0240    PT SHORT TERM GOAL #1   Title Patient will demonstrate no less than 7 degrees extension and at least 90 degrees flexion in pain free range    Status Achieved   PT SHORT TERM GOAL #2   Title Patient will demonstrate at least 4/5 strength in bilateral lower extremities and all proximal muscle groups    Time 2   Status Partially Met   PT SHORT TERM GOAL #3   Title Patient will be able to ambulate at least 45 minutes over  even and uneven surfaces with single point cane and R knee pain no more than 2/10   Baseline Pt is walking on uneven ground for about 30 minutes.    Time 2   Period Weeks   Status On-going   PT SHORT TERM GOAL #4   Title Patient will be able to perform functional sit to stand transfer without the use of her upper extremities and R knee pain no more than 2/10   Time 2   Period Weeks   Status Achieved   PT SHORT TERM GOAL #5   Title Patient to demonstrate reduced levels of edema in her R knee and will be able to successfully self-perform appropriate massage technique to reduce edema and patellar mobilizations safely on an independent basis    Time 2   Status Achieved           PT Long Term Goals - 02/26/15 0954    PT LONG TERM GOAL #1   Title Patient will be independent in advanced HEP and will be able to perform her exercise routine correctly on a consistent basis    Baseline 6/1- patient states that she is doing them, just not 3 times a day    Time 4   Period Weeks   Status Achieved   PT LONG TERM GOAL #2   Title Patient will demonstrate no more than 3 degrees R knee extension and will be able to perform R  knee flexion of at least 115 degrees    Baseline 02/26/2015:  2-92 active    Time 4   Period Weeks   Status On-going   PT LONG TERM GOAL #3   Title Patient will be able to ambulate unlimited distances on level and unlevel surfaces with no assistive device, good balance, and minimal fall risk, R knee pain 0/10   Time 4   Period Weeks   Status On-going   PT LONG TERM GOAL #4   Title Patient will be able to ascend and descend full flight of stairs with U railing, no assistive device, and reciprocal pattern free of compensation strategies    Time 4   Period Weeks   Status On-going   PT LONG TERM GOAL #5   Title Patient will be unlimited in her ability to perform functional tasks and chores around her home with R knee pain 0/10 and no assistive device, minimal rest breaks  Time 4   Period Weeks   Status On-going               Plan - 03/03/15 1831    Clinical Impression Statement Focused mainly on increasing Rt knee ROM.  Pt close to making full revolution on bike.  Overall reduction of adhesions around knee, however continues to have tightness in distal quad and scar requiring myofascial techniques.  Able to achieve 100 degrees today wtih AAROM in supine, 98 degrees actvitely.  Pt continues to require verbal cues to ambulate with heel strike as tends to walk on Toe with Rt foot advancement.     PT Next Visit Plan Continue with aggressive PROM to increase knee flexion.         Problem List Patient Active Problem List   Diagnosis Date Noted  . OA (osteoarthritis) of knee 12/13/2014    Teena Irani, PTA/CLT [phone]  03/03/2015, 6:36 PM  [place] [address] [place], [place], [address] Phone: [phone]   Fax:  [phone]

## 2015-03-05 ENCOUNTER — Ambulatory Visit (HOSPITAL_COMMUNITY): Payer: Medicare Other

## 2015-03-05 DIAGNOSIS — R531 Weakness: Secondary | ICD-10-CM

## 2015-03-05 DIAGNOSIS — M25661 Stiffness of right knee, not elsewhere classified: Secondary | ICD-10-CM | POA: Diagnosis not present

## 2015-03-05 DIAGNOSIS — Z96651 Presence of right artificial knee joint: Secondary | ICD-10-CM

## 2015-03-05 DIAGNOSIS — M24661 Ankylosis, right knee: Secondary | ICD-10-CM

## 2015-03-05 DIAGNOSIS — M25561 Pain in right knee: Secondary | ICD-10-CM | POA: Diagnosis not present

## 2015-03-05 DIAGNOSIS — Z471 Aftercare following joint replacement surgery: Secondary | ICD-10-CM | POA: Diagnosis not present

## 2015-03-05 DIAGNOSIS — M25461 Effusion, right knee: Secondary | ICD-10-CM | POA: Diagnosis not present

## 2015-03-05 DIAGNOSIS — M6281 Muscle weakness (generalized): Secondary | ICD-10-CM | POA: Diagnosis not present

## 2015-03-05 DIAGNOSIS — R609 Edema, unspecified: Secondary | ICD-10-CM

## 2015-03-05 NOTE — Therapy (Signed)
[name], [place], [address] Phone: [phone]   Fax:  [phone]  Physical Therapy Treatment  Patient Details  Name: [name] MRN: [id number] Date of Birth: 1941-08-12 Referring Provider:  Gaynelle Arabian, MD  Encounter Date: 03/05/2015      PT End of Session - 03/05/15 1101    Visit Number 33   Number of Visits 39   Date for PT Re-Evaluation 03/19/15  MD 03/14/2015   Authorization Type Medicare    Authorization Time Period 02/23/15 to 04/25/15;     Authorization - Visit Number 33   Authorization - Number of Visits 91   PT Start Time 3009   PT Stop Time 1105   PT Time Calculation (min) 47 min   Activity Tolerance Patient tolerated treatment well   Behavior During Therapy WFL for tasks assessed/performed      Past Medical History  Diagnosis Date  . Hypertension   . Hypothyroidism   . Arthritis   . Hyperlipemia   . Wears glasses   . Complication of anesthesia     woke up too early from a surgery  . Bladder spasms     "spastic bladder" wears pads  . GERD (gastroesophageal reflux disease)     occ. frequent "hiccoughs" after "eating, indigestion"  . Trigger finger, left     alternate fingers affected. -Occ. shooting pain.  . Cancer     Skin cancer -scalp and eyebrow"basal cell"    Past Surgical History  Procedure Laterality Date  . Total hip arthroplasty  2006    right  . Total hip arthroplasty  2006    left  . Tonsillectomy    . Appendectomy    . Carpal tunnel release Left   . Thyroidectomy  1994  . Cardiac catheterization  2006    normal-done for work up pre hip surgeries  . Cholecystectomy  1997  . Abdominal hysterectomy  1989  . Polypectomy Bilateral 06/26/2013    Procedure: BILATERAL EXCISION OF NASAL POLYPS ;  Surgeon: Ascencion Dike, MD;  Location: [place];  Service: ENT;  Laterality: Bilateral;  . Septoplasty N/A 06/26/2013    Procedure: AND SEPTOPLASTY;   Surgeon: Ascencion Dike, MD;  Location: [place];  Service: ENT;  Laterality: N/A;  . Joint replacement      BTHA  . Total knee arthroplasty Right 12/13/2014    Procedure: RIGHT TOTAL KNEE ARTHROPLASTY;  Surgeon: Gaynelle Arabian, MD;  Location: [facility];  Service: Orthopedics;  Laterality: Right;    There were no vitals filed for this visit.  Visit Diagnosis:  Status post total right knee replacement  Knee pain, right  Decreased range of motion of knee, right  Edema  Weakness generalized      Subjective Assessment - 03/05/15 1034    Subjective Pain free, pt. has been out in her garden and stateing it's hot outside.    Currently in Pain? No/denies                [facility] Adult PT Treatment/Exercise - 03/05/15 0001    Knee/Hip Exercises: Stretches   Quad Stretch 3 reps;30 seconds   Quad Stretch Limitations prone with rope    Knee: Self-Stretch to increase Flexion Right;10 seconds   Knee: Self-Stretch Limitations 10 X on 12" box   Gastroc Stretch 3 reps;30 seconds   Gastroc Stretch Limitations slant board   Knee/Hip Exercises: Aerobic   Stationary Bike rocking x  10' seat 10   Knee/Hip Exercises: Standing   Gait Training Gait training 291fet emphasis on heel to toe   Knee/Hip Exercises: Supine   Heel Slides 10 reps   Knee Flexion PROM   Knee Flexion Limitations 100 degrees   Knee/Hip Exercises: Prone   Contract/Relax to Increase Flexion x5   Manual Therapy   Manual Therapy Myofascial release;Soft tissue mobilization   Manual therapy comments and soft tissue work to Rt knee prior to PROM                  PT Short Term Goals - 03/05/15 1101    PT SHORT TERM GOAL #1   Title Patient will demonstrate no less than 7 degrees extension and at least 90 degrees flexion in pain free range    Status Achieved   PT SHORT TERM GOAL #2   Title Patient will demonstrate at least 4/5 strength in bilateral lower extremities and all proximal muscle groups     Status Partially Met   PT SHORT TERM GOAL #3   Title Patient will be able to ambulate at least 45 minutes over even and uneven surfaces with single point cane and R knee pain no more than 2/10   Status On-going   PT SHORT TERM GOAL #4   Title Patient will be able to perform functional sit to stand transfer without the use of her upper extremities and R knee pain no more than 2/10   Status Achieved   PT SHORT TERM GOAL #5   Title Patient to demonstrate reduced levels of edema in her R knee and will be able to successfully self-perform appropriate massage technique to reduce edema and patellar mobilizations safely on an independent basis    Status Achieved           PT Long Term Goals - 03/05/15 1102    PT LONG TERM GOAL #1   Title Patient will be independent in advanced HEP and will be able to perform her exercise routine correctly on a consistent basis    Status Achieved   PT LONG TERM GOAL #2   Title Patient will demonstrate no more than 3 degrees R knee extension and will be able to perform R  knee flexion of at least 115 degrees    Status On-going   PT LONG TERM GOAL #3   Title Patient will be able to ambulate unlimited distances on level and unlevel surfaces with no assistive device, good balance, and minimal fall risk, R knee pain 0/10   PT LONG TERM GOAL #4   Title Patient will be able to ascend and descend full flight of stairs with U railing, no assistive device, and reciprocal pattern free of compensation strategies    PT LONG TERM GOAL #5   Title Patient will be unlimited in her ability to perform functional tasks and chores around her home with R knee pain 0/10 and no assistive device, minimal rest breaks                Plan - 03/05/15 1102    Clinical Impression Statement Session focus on improving Rt knee ROM.  Continued stretches, manual and ROM exercises.  Adhesions and restricted patella mobilty noted with Rt knee limiting ROM, manual techniques complete to  reduce fascial restrictions.  Rt knee ROM 100 degress, Lt knee AROM 104 degrees flexion.  Pt close to making full revolution on bicycle  No reports of pain through session.   PT Next Visit  Plan Continue with aggressive PROM to increase knee flexion.         Problem List Patient Active Problem List   Diagnosis Date Noted  . OA (osteoarthritis) of knee 12/13/2014   Aldona Lento, PTA  Aldona Lento 03/05/2015, 11:16 AM  [place], [place], [address] Phone: [phone]   Fax:  [phone]

## 2015-03-07 ENCOUNTER — Ambulatory Visit (HOSPITAL_COMMUNITY): Payer: Medicare Other | Attending: Orthopedic Surgery

## 2015-03-07 DIAGNOSIS — M25561 Pain in right knee: Secondary | ICD-10-CM | POA: Diagnosis not present

## 2015-03-07 DIAGNOSIS — R531 Weakness: Secondary | ICD-10-CM | POA: Insufficient documentation

## 2015-03-07 DIAGNOSIS — Z96651 Presence of right artificial knee joint: Secondary | ICD-10-CM | POA: Insufficient documentation

## 2015-03-07 DIAGNOSIS — R609 Edema, unspecified: Secondary | ICD-10-CM | POA: Insufficient documentation

## 2015-03-07 DIAGNOSIS — M24661 Ankylosis, right knee: Secondary | ICD-10-CM | POA: Diagnosis not present

## 2015-03-07 NOTE — Therapy (Signed)
[facility] [place], [place], [address] Phone: [phone]   Fax:  [phone]  Physical Therapy Treatment  Patient Details  Name: [name] MRN: [id number] Date of Birth: 1941/05/17 Referring Provider:  Gaynelle Arabian, MD  Encounter Date: 03/07/2015      PT End of Session - 03/07/15 1111    Visit Number 34   Number of Visits 39   Date for PT Re-Evaluation 03/19/15  MD 03/14/2015   Authorization Type Medicare    Authorization Time Period 02/23/15 to 04/25/15;     Authorization - Visit Number 34   Authorization - Number of Visits 10   PT Start Time 5329   PT Stop Time 1210   PT Time Calculation (min) 67 min   Activity Tolerance Patient tolerated treatment well   Behavior During Therapy WFL for tasks assessed/performed      Past Medical History  Diagnosis Date  . Hypertension   . Hypothyroidism   . Arthritis   . Hyperlipemia   . Wears glasses   . Complication of anesthesia     woke up too early from a surgery  . Bladder spasms     "spastic bladder" wears pads  . GERD (gastroesophageal reflux disease)     occ. frequent "hiccoughs" after "eating, indigestion"  . Trigger finger, left     alternate fingers affected. -Occ. shooting pain.  . Cancer     Skin cancer -scalp and eyebrow"basal cell"    Past Surgical History  Procedure Laterality Date  . Total hip arthroplasty  2006    right  . Total hip arthroplasty  2006    left  . Tonsillectomy    . Appendectomy    . Carpal tunnel release Left   . Thyroidectomy  1994  . Cardiac catheterization  2006    normal-done for work up pre hip surgeries  . Cholecystectomy  1997  . Abdominal hysterectomy  1989  . Polypectomy Bilateral 06/26/2013    Procedure: BILATERAL EXCISION OF NASAL POLYPS ;  Surgeon: Ascencion Dike, MD;  Location: [facility];  Service: ENT;  Laterality: Bilateral;  . Septoplasty N/A 06/26/2013    Procedure: AND SEPTOPLASTY;   Surgeon: Ascencion Dike, MD;  Location: [facility];  Service: ENT;  Laterality: N/A;  . Joint replacement      BTHA  . Total knee arthroplasty Right 12/13/2014    Procedure: RIGHT TOTAL KNEE ARTHROPLASTY;  Surgeon: Gaynelle Arabian, MD;  Location: [facility];  Service: Orthopedics;  Laterality: Right;    There were no vitals filed for this visit.  Visit Diagnosis:  Status post total right knee replacement  Knee pain, right  Decreased range of motion of knee, right  Edema  Weakness generalized      Subjective Assessment - 03/07/15 1108    Subjective Pt stated she has increased pain following sleeping on her Rt side in her bed, currently pain scale 2/10 today   Currently in Pain? Yes   Pain Score 2    Pain Location Knee   Pain Orientation Right   Pain Descriptors / Indicators Sore  soreness proximal patella              [facility] Adult PT Treatment/Exercise - 03/07/15 0001    Exercises   Exercises Knee/Hip   Knee/Hip Exercises: Stretches   Active Hamstring Stretch 3 reps;30 seconds   Active Hamstring Stretch Limitations 12in step   Quad Stretch 3 reps;30 seconds  Quad Stretch Limitations prone with rope    Knee: Self-Stretch to increase Flexion Right;10 seconds   Knee: Self-Stretch Limitations 10 X on 12" box   Gastroc Stretch 3 reps;30 seconds   Gastroc Stretch Limitations slant board   Knee/Hip Exercises: Aerobic   Stationary Bike rocking x 10' seat 10; full revolution backwards with cueing to reduce hip compensation   Knee/Hip Exercises: Standing   Knee Flexion Right;10 reps   Knee Flexion Limitations toe resting on 4in step   Knee/Hip Exercises: Supine   Heel Slides 10 reps   Knee Flexion PROM   Knee/Hip Exercises: Prone   Contract/Relax to Increase Flexion x5   Modalities   Modalities Cryotherapy   Cryotherapy   Number Minutes Cryotherapy 10 Minutes   Cryotherapy Location Knee   Type of Cryotherapy Ice pack   Manual Therapy   Manual Therapy  Myofascial release;Soft tissue mobilization   Manual therapy comments soft tissue work to Rt knee prior to PROM                  PT Short Term Goals - 03/07/15 1112    PT SHORT TERM GOAL #1   Title Patient will demonstrate no less than 7 degrees extension and at least 90 degrees flexion in pain free range    Status Achieved   PT SHORT TERM GOAL #2   Title Patient will demonstrate at least 4/5 strength in bilateral lower extremities and all proximal muscle groups    Status Partially Met   PT SHORT TERM GOAL #3   Title Patient will be able to ambulate at least 45 minutes over even and uneven surfaces with single point cane and R knee pain no more than 2/10   Status On-going   PT SHORT TERM GOAL #4   Title Patient will be able to perform functional sit to stand transfer without the use of her upper extremities and R knee pain no more than 2/10   Status Achieved   PT SHORT TERM GOAL #5   Title Patient to demonstrate reduced levels of edema in her R knee and will be able to successfully self-perform appropriate massage technique to reduce edema and patellar mobilizations safely on an independent basis    Status Achieved           PT Long Term Goals - 03/07/15 1113    PT LONG TERM GOAL #1   Title Patient will be independent in advanced HEP and will be able to perform her exercise routine correctly on a consistent basis    Status Achieved   PT LONG TERM GOAL #2   Title Patient will demonstrate no more than 3 degrees R knee extension and will be able to perform R  knee flexion of at least 115 degrees    Status On-going   PT LONG TERM GOAL #3   Title Patient will be able to ambulate unlimited distances on level and unlevel surfaces with no assistive device, good balance, and minimal fall risk, R knee pain 0/10   Status On-going   PT LONG TERM GOAL #4   Title Patient will be able to ascend and descend full flight of stairs with U railing, no assistive device, and reciprocal  pattern free of compensation strategies    Status On-going   PT LONG TERM GOAL #5   Title Patient will be unlimited in her ability to perform functional tasks and chores around her home with R knee pain 0/10 and no assistive device, minimal  rest breaks    Status On-going               Plan - 03/07/15 1152    Clinical Impression Statement Session focus on improving Rt knee ROM.  Stretches, manual and ROM exercises complete, pt. able to make full revolution backwards seat 10, cueing to reduce compensation with hip hiking to increase ROM.  PROM in supine and prone per pt. tolerance.   PT Next Visit Plan Continue with aggressive PROM to increase knee flexion.         Problem List Patient Active Problem List   Diagnosis Date Noted  . OA (osteoarthritis) of knee 12/13/2014   Aldona Lento, PTA  Aldona Lento 03/07/2015, 12:09 PM  [facility] [address] [place], [place], [address] Phone: [phone]   Fax:  [phone]

## 2015-03-11 ENCOUNTER — Ambulatory Visit (HOSPITAL_COMMUNITY): Payer: Medicare Other

## 2015-03-11 DIAGNOSIS — M24661 Ankylosis, right knee: Secondary | ICD-10-CM

## 2015-03-11 DIAGNOSIS — M25561 Pain in right knee: Secondary | ICD-10-CM | POA: Diagnosis not present

## 2015-03-11 DIAGNOSIS — Z96651 Presence of right artificial knee joint: Secondary | ICD-10-CM | POA: Diagnosis not present

## 2015-03-11 DIAGNOSIS — R531 Weakness: Secondary | ICD-10-CM

## 2015-03-11 DIAGNOSIS — R609 Edema, unspecified: Secondary | ICD-10-CM

## 2015-03-11 NOTE — Therapy (Signed)
[facility] [place], [place], [address] Phone: [phone]   Fax:  [phone]  Physical Therapy Treatment  Patient Details  Name: [name] MRN: [id number] Date of Birth: 05/22/1941 Referring Provider:  Gaynelle Arabian, MD  Encounter Date: 03/11/2015      PT End of Session - 03/11/15 1141    Visit Number 35   Number of Visits 39   Date for PT Re-Evaluation 03/19/15  MD 03/14/2015   Authorization Type Medicare    Authorization Time Period 02/23/15 to 04/25/15;     Authorization - Visit Number 35   Authorization - Number of Visits 20   PT Start Time 7893   PT Stop Time 1204   PT Time Calculation (min) 56 min   Activity Tolerance Patient tolerated treatment well   Behavior During Therapy WFL for tasks assessed/performed      Past Medical History  Diagnosis Date  . Hypertension   . Hypothyroidism   . Arthritis   . Hyperlipemia   . Wears glasses   . Complication of anesthesia     woke up too early from a surgery  . Bladder spasms     "spastic bladder" wears pads  . GERD (gastroesophageal reflux disease)     occ. frequent "hiccoughs" after "eating, indigestion"  . Trigger finger, left     alternate fingers affected. -Occ. shooting pain.  . Cancer     Skin cancer -scalp and eyebrow"basal cell"    Past Surgical History  Procedure Laterality Date  . Total hip arthroplasty  2006    right  . Total hip arthroplasty  2006    left  . Tonsillectomy    . Appendectomy    . Carpal tunnel release Left   . Thyroidectomy  1994  . Cardiac catheterization  2006    normal-done for work up pre hip surgeries  . Cholecystectomy  1997  . Abdominal hysterectomy  1989  . Polypectomy Bilateral 06/26/2013    Procedure: BILATERAL EXCISION OF NASAL POLYPS ;  Surgeon: Ascencion Dike, MD;  Location: [facility];  Service: ENT;  Laterality: Bilateral;  . Septoplasty N/A 06/26/2013    Procedure: AND SEPTOPLASTY;   Surgeon: Ascencion Dike, MD;  Location: [facility];  Service: ENT;  Laterality: N/A;  . Joint replacement      BTHA  . Total knee arthroplasty Right 12/13/2014    Procedure: RIGHT TOTAL KNEE ARTHROPLASTY;  Surgeon: Gaynelle Arabian, MD;  Location: [facility];  Service: Orthopedics;  Laterality: Right;    There were no vitals filed for this visit.  Visit Diagnosis:  Status post total right knee replacement  Knee pain, right  Decreased range of motion of knee, right  Edema  Weakness generalized      Subjective Assessment - 03/11/15 1131    Subjective Knee feels pretty good today, no reports of pain   Currently in Pain? No/denies           [facility] Adult PT Treatment/Exercise - 03/11/15 0001    Exercises   Exercises Knee/Hip   Knee/Hip Exercises: Stretches   Quad Stretch 3 reps;30 seconds   Quad Stretch Limitations prone with rope    Knee: Self-Stretch to increase Flexion Right;10 seconds   Knee: Self-Stretch Limitations 10 X on 12" box   Gastroc Stretch 3 reps;30 seconds   Gastroc Stretch Limitations slant board   Knee/Hip Exercises: Aerobic   Stationary Bike full revolution forwards seat 11 x  8'   Knee/Hip Exercises: Standing   Knee Flexion Right;15 reps   Knee Flexion Limitations toe resting on 4in step   Functional Squat 10 reps;2 sets   Functional Squat Limitations STS at mat table with no hand assist   Stairs 2 RT ascending and descending 7in step   Knee/Hip Exercises: Supine   Heel Slides 10 reps   Knee Flexion PROM   Knee/Hip Exercises: Prone   Contract/Relax to Increase Flexion x5   Manual Therapy   Manual Therapy Myofascial release;Soft tissue mobilization;Joint mobilization   Manual therapy comments soft tissue work to Rt knee prior to PROM   Joint Mobilization patella mobs all directions                  PT Short Term Goals - 03/11/15 1214    PT SHORT TERM GOAL #1   Title Patient will demonstrate no less than 7 degrees extension and at  least 90 degrees flexion in pain free range    PT SHORT TERM GOAL #2   Status Partially Met   PT SHORT TERM GOAL #3   Title Patient will be able to ambulate at least 45 minutes over even and uneven surfaces with single point cane and R knee pain no more than 2/10   Status On-going   PT SHORT TERM GOAL #4   Title Patient will be able to perform functional sit to stand transfer without the use of her upper extremities and R knee pain no more than 2/10   Status Achieved   PT SHORT TERM GOAL #5   Title Patient to demonstrate reduced levels of edema in her R knee and will be able to successfully self-perform appropriate massage technique to reduce edema and patellar mobilizations safely on an independent basis    Status Achieved           PT Long Term Goals - 03/11/15 1214    PT LONG TERM GOAL #1   Title Patient will be independent in advanced HEP and will be able to perform her exercise routine correctly on a consistent basis    Status Achieved   PT LONG TERM GOAL #2   Title Patient will demonstrate no more than 3 degrees R knee extension and will be able to perform R  knee flexion of at least 115 degrees    Status On-going   PT LONG TERM GOAL #3   Title Patient will be able to ambulate unlimited distances on level and unlevel surfaces with no assistive device, good balance, and minimal fall risk, R knee pain 0/10   Status On-going   PT LONG TERM GOAL #4   Title Patient will be able to ascend and descend full flight of stairs with U railing, no assistive device, and reciprocal pattern free of compensation strategies    Status On-going   PT LONG TERM GOAL #5   Title Patient will be unlimited in her ability to perform functional tasks and chores around her home with R knee pain 0/10 and no assistive device, minimal rest breaks    Status On-going               Plan - 03/11/15 1142    Clinical Impression Statement Primary session focus on improving Rt knee ROM and functional  strengtheing. Continued stretches, ROM exercises and manual to improve ROM and decrease adhesions surrounding patella.  Able to improve patella mobility following manual R/L though continues to be restricted superior and inferior.  Pt continued  to demonstrate difficulty with functional activities, noted increased difficulty descending stairs Bil knee and lacking depth with squats due to impaired ROM and strength.   PT Next Visit Plan Reassess prior MD apt (03/14/2015) next session.  Continue with aggressive PROM to increase knee flexion.         Problem List Patient Active Problem List   Diagnosis Date Noted  . OA (osteoarthritis) of knee 12/13/2014   Aldona Lento, PTA  Aldona Lento 03/11/2015, 12:19 PM  [facility] [place], [place], [address] Phone: [phone]   Fax:  [phone]

## 2015-03-12 ENCOUNTER — Ambulatory Visit (HOSPITAL_COMMUNITY): Payer: Medicare Other

## 2015-03-12 DIAGNOSIS — M24661 Ankylosis, right knee: Secondary | ICD-10-CM

## 2015-03-12 DIAGNOSIS — R609 Edema, unspecified: Secondary | ICD-10-CM

## 2015-03-12 DIAGNOSIS — R531 Weakness: Secondary | ICD-10-CM | POA: Diagnosis not present

## 2015-03-12 DIAGNOSIS — M25561 Pain in right knee: Secondary | ICD-10-CM

## 2015-03-12 DIAGNOSIS — Z96651 Presence of right artificial knee joint: Secondary | ICD-10-CM | POA: Diagnosis not present

## 2015-03-12 NOTE — Therapy (Signed)
[facility] [place], [place], [address] Phone: [phone]   Fax:  [phone]  Physical Therapy Treatment  Patient Details  Name: [name] MRN: [id number] Date of Birth: 1941-08-08 Referring Provider:  Gaynelle Arabian, MD  Encounter Date: 03/12/2015      PT End of Session - 03/12/15 0936    Visit Number 36   Number of Visits 39   Date for PT Re-Evaluation 03/19/15   Authorization Type Medicare    Authorization Time Period 02/23/15 to 04/25/15;     Authorization - Visit Number 36   Authorization - Number of Visits 27   PT Start Time 0933   PT Stop Time 1020   PT Time Calculation (min) 47 min   Activity Tolerance Patient tolerated treatment well   Behavior During Therapy WFL for tasks assessed/performed      Past Medical History  Diagnosis Date  . Hypertension   . Hypothyroidism   . Arthritis   . Hyperlipemia   . Wears glasses   . Complication of anesthesia     woke up too early from a surgery  . Bladder spasms     "spastic bladder" wears pads  . GERD (gastroesophageal reflux disease)     occ. frequent "hiccoughs" after "eating, indigestion"  . Trigger finger, left     alternate fingers affected. -Occ. shooting pain.  . Cancer     Skin cancer -scalp and eyebrow"basal cell"    Past Surgical History  Procedure Laterality Date  . Total hip arthroplasty  2006    right  . Total hip arthroplasty  2006    left  . Tonsillectomy    . Appendectomy    . Carpal tunnel release Left   . Thyroidectomy  1994  . Cardiac catheterization  2006    normal-done for work up pre hip surgeries  . Cholecystectomy  1997  . Abdominal hysterectomy  1989  . Polypectomy Bilateral 06/26/2013    Procedure: BILATERAL EXCISION OF NASAL POLYPS ;  Surgeon: Ascencion Dike, MD;  Location: [facility];  Service: ENT;  Laterality: Bilateral;  . Septoplasty N/A 06/26/2013    Procedure: AND SEPTOPLASTY;  Surgeon: Ascencion Dike,  MD;  Location: [facility];  Service: ENT;  Laterality: N/A;  . Joint replacement      BTHA  . Total knee arthroplasty Right 12/13/2014    Procedure: RIGHT TOTAL KNEE ARTHROPLASTY;  Surgeon: Gaynelle Arabian, MD;  Location: [facility];  Service: Orthopedics;  Laterality: Right;    There were no vitals filed for this visit.  Visit Diagnosis:  Status post total right knee replacement  Knee pain, right  Decreased range of motion of knee, right  Edema  Weakness generalized      Subjective Assessment - 03/12/15 0934    Subjective Pt stated pain scale 2-3/10, had to take oxicodone last night, did have a good nights sleep   Currently in Pain? Yes   Pain Score 2    Pain Location Knee   Pain Orientation Right            [facility] PT Assessment - 03/12/15 0001    Assessment   Medical Diagnosis R total knee replacement    Onset Date/Surgical Date 12/13/14   Next MD Visit [place] 03/14/2015   Single Leg Stance   Comments Rt 6", LT 3"   AROM   Right Knee Extension 2  was 2   Right Knee Flexion 98  was 92   PROM   Overall PROM Comments 0-101  was 0-95   Strength   Right Hip Flexion 4/5   Right Hip Extension 4-/5  was 4-/5   Right Hip ABduction 4+/5  was 5-/5   Right Knee Flexion 4/5  was 4/5   Right Knee Extension 5/5   Left Knee Flexion 5/5   Left Knee Extension 5/5   Right Ankle Dorsiflexion 5/5   Left Ankle Dorsiflexion 5/5                     [facility] Adult PT Treatment/Exercise - 03/12/15 0001    Exercises   Exercises Knee/Hip   Knee/Hip Exercises: Stretches   Active Hamstring Stretch 3 reps;30 seconds   Active Hamstring Stretch Limitations 12in step   Quad Stretch 3 reps;30 seconds   Quad Stretch Limitations prone with rope    Knee: Self-Stretch to increase Flexion Right;10 seconds   Knee: Self-Stretch Limitations 10 X on 12" box   Gastroc Stretch 3 reps;30 seconds   Gastroc Stretch Limitations slant board   Knee/Hip Exercises: Aerobic    Stationary Bike full revolution forwards seat 11 x 8'   Knee/Hip Exercises: Standing   Stairs 2 RT ascending and descending 7in step   SLS Rt 6", Lt 3" max of 3   Knee/Hip Exercises: Supine   Heel Slides 10 reps   Knee Flexion PROM   Knee Flexion Limitations 101 degrees   Knee/Hip Exercises: Prone   Contract/Relax to Increase Flexion x5   Manual Therapy   Manual Therapy Myofascial release;Soft tissue mobilization;Joint mobilization   Manual therapy comments soft tissue work to Rt knee prior to PROM   Joint Mobilization patella mobs all directions                  PT Short Term Goals - 03/12/15 5945    PT SHORT TERM GOAL #1   Title Patient will demonstrate no less than 7 degrees extension and at least 90 degrees flexion in pain free range    Status Achieved   PT SHORT TERM GOAL #2   Title Patient will demonstrate at least 4/5 strength in bilateral lower extremities and all proximal muscle groups    Baseline 6/1- extensors (glut max) remain weak based on functional observation    Status Partially Met   PT SHORT TERM GOAL #3   Title Patient will be able to ambulate at least 45 minutes over even and uneven surfaces with single point cane and R knee pain no more than 2/10   Baseline Pt is walking on uneven ground for about 40 minutes with no AD   Status On-going   PT SHORT TERM GOAL #4   Title Patient will be able to perform functional sit to stand transfer without the use of her upper extremities and R knee pain no more than 2/10   Status Achieved   PT SHORT TERM GOAL #5   Title Patient to demonstrate reduced levels of edema in her R knee and will be able to successfully self-perform appropriate massage technique to reduce edema and patellar mobilizations safely on an independent basis    Status Achieved           PT Long Term Goals - 03/12/15 8592    PT LONG TERM GOAL #1   Title Patient will be independent in advanced HEP and will be able to perform her exercise  routine correctly on a consistent basis    Status  Achieved   PT LONG TERM GOAL #2   Title Patient will demonstrate no more than 3 degrees R knee extension and will be able to perform R  knee flexion of at least 115 degrees    Baseline 03/12/2015 AROM 2-98 degrees, PROM 0-101   Status Partially Met   PT LONG TERM GOAL #3   Title Patient will be able to ambulate unlimited distances on level and unlevel surfaces with no assistive device, good balance, and minimal fall risk, R knee pain 0/10   Baseline 03/12/2015 Pt walking uneven surface for 40 minutes with no AD, pain scale 3/10   Status On-going   PT LONG TERM GOAL #4   Title Patient will be able to ascend and descend full flight of stairs with U railing, no assistive device, and reciprocal pattern free of compensation strategies    Status On-going   PT LONG TERM GOAL #5   Title Patient will be unlimited in her ability to perform functional tasks and chores around her home with R knee pain 0/10 and no assistive device, minimal rest breaks    Status On-going               Plan - 03/12/15 0949    Clinical Impression Statement Reassessment complete with the following findings:  Pt compliant with HEP daily.  Pt continues to be restricted ROM and demonstrates weak gluteal musculature with gait and difficulty with functional activities. Rt knee  AROM 2-98, PROM 0-101.  Lt knee 104 degrees flexion.  Pt now ambulating with no AD on level and uneven ground with reports of ability to walk for 40 minutes and no reports of LOB episodes of falls. Balance is still a deficit Bil LE with ability to SLS 6" max of 5.  Manual techniques have been complete to decrease adhesions perimeter of knee to improve patella mobilty with improved ROM following manual.  Pt will continue to benefit from skilled interventtion.for functional strengthening, ROM, balance and gait training.   PT Next Visit Plan F/U with MD.  Continue with aggressive PROM to increase knee  flexion.         Problem List Patient Active Problem List   Diagnosis Date Noted  . OA (osteoarthritis) of knee 12/13/2014   Aldona Lento, PTA, CBIS   Aldona Lento 03/12/2015, 10:55 AM Rayetta Humphrey, PT [facility] [address] [place], [place], [address] Phone: [phone]   Fax:  [phone]

## 2015-03-13 DIAGNOSIS — B351 Tinea unguium: Secondary | ICD-10-CM | POA: Diagnosis not present

## 2015-03-13 DIAGNOSIS — M79671 Pain in right foot: Secondary | ICD-10-CM | POA: Diagnosis not present

## 2015-03-14 DIAGNOSIS — Z471 Aftercare following joint replacement surgery: Secondary | ICD-10-CM | POA: Diagnosis not present

## 2015-03-14 DIAGNOSIS — Z96651 Presence of right artificial knee joint: Secondary | ICD-10-CM | POA: Diagnosis not present

## 2015-03-18 ENCOUNTER — Ambulatory Visit (HOSPITAL_COMMUNITY): Payer: Medicare Other

## 2015-03-18 DIAGNOSIS — M25561 Pain in right knee: Secondary | ICD-10-CM | POA: Diagnosis not present

## 2015-03-18 DIAGNOSIS — M24661 Ankylosis, right knee: Secondary | ICD-10-CM

## 2015-03-18 DIAGNOSIS — R531 Weakness: Secondary | ICD-10-CM | POA: Diagnosis not present

## 2015-03-18 DIAGNOSIS — Z96651 Presence of right artificial knee joint: Secondary | ICD-10-CM

## 2015-03-18 DIAGNOSIS — R609 Edema, unspecified: Secondary | ICD-10-CM

## 2015-03-18 NOTE — Therapy (Signed)
[place], [place], [address] Phone: [phone]   Fax:  [phone]  Physical Therapy Treatment  Patient Details  Name: [name] MRN: [id number] Date of Birth: September 04, 1941 Referring Provider:  Gaynelle Arabian, MD  Encounter Date: 03/18/2015      PT End of Session - 03/18/15 1618    Visit Number 37   Number of Visits 39   Date for PT Re-Evaluation 03/19/15   Authorization Type Medicare    Authorization Time Period 02/23/15 to 04/25/15;     Authorization - Visit Number 30   Authorization - Number of Visits 39   PT Start Time 2831   PT Stop Time 1646   PT Time Calculation (min) 38 min   Activity Tolerance Patient tolerated treatment well   Behavior During Therapy WFL for tasks assessed/performed      Past Medical History  Diagnosis Date  . Hypertension   . Hypothyroidism   . Arthritis   . Hyperlipemia   . Wears glasses   . Complication of anesthesia     woke up too early from a surgery  . Bladder spasms     "spastic bladder" wears pads  . GERD (gastroesophageal reflux disease)     occ. frequent "hiccoughs" after "eating, indigestion"  . Trigger finger, left     alternate fingers affected. -Occ. shooting pain.  . Cancer     Skin cancer -scalp and eyebrow"basal cell"    Past Surgical History  Procedure Laterality Date  . Total hip arthroplasty  2006    right  . Total hip arthroplasty  2006    left  . Tonsillectomy    . Appendectomy    . Carpal tunnel release Left   . Thyroidectomy  1994  . Cardiac catheterization  2006    normal-done for work up pre hip surgeries  . Cholecystectomy  1997  . Abdominal hysterectomy  1989  . Polypectomy Bilateral 06/26/2013    Procedure: BILATERAL EXCISION OF NASAL POLYPS ;  Surgeon: Ascencion Dike, MD;  Location: [facility];  Service: ENT;  Laterality: Bilateral;  . Septoplasty N/A 06/26/2013    Procedure: AND SEPTOPLASTY;  Surgeon: Ascencion Dike, MD;  Location: [facility];  Service: ENT;  Laterality: N/A;  . Joint replacement      BTHA  . Total knee arthroplasty Right 12/13/2014    Procedure: RIGHT TOTAL KNEE ARTHROPLASTY;  Surgeon: Gaynelle Arabian, MD;  Location: [facility];  Service: Orthopedics;  Laterality: Right;    There were no vitals filed for this visit.  Visit Diagnosis:  Knee pain, right  Decreased range of motion of knee, right  Edema  Weakness generalized  Status post total right knee replacement      Subjective Assessment - 03/18/15 1609    Subjective Pt went to MD on Friday 03/14/2015 and reports ready for D/C will allow 2 more sessions per pt.'s request.   Currently in Pain? No/denies            [facility] Adult PT Treatment/Exercise - 03/18/15 0001    Exercises   Exercises Knee/Hip   Knee/Hip Exercises: Stretches   Active Hamstring Stretch 3 reps;30 seconds   Active Hamstring Stretch Limitations 12in step   Quad Stretch 3 reps;30 seconds   Quad Stretch Limitations prone with rope    Knee: Self-Stretch to increase Flexion Right;10 seconds   Knee: Self-Stretch Limitations 10 X on 12" box   Gastroc Stretch  3 reps;30 seconds   Gastroc Stretch Limitations slant board   Knee/Hip Exercises: Aerobic   Stationary Bike full revolution forwards seat 11 x 8'   Knee/Hip Exercises: Standing   SLS Rt 7", Lt 5"   Knee/Hip Exercises: Supine   Heel Slides 10 reps   Knee Flexion PROM   Knee Flexion Limitations 101 degrees   Knee/Hip Exercises: Prone   Contract/Relax to Increase Flexion x5   Manual Therapy   Manual Therapy Myofascial release;Soft tissue mobilization;Joint mobilization   Manual therapy comments soft tissue work to Rt knee prior to PROM   Joint Mobilization patella mobs all directions           PT Short Term Goals - 03/18/15 1845    PT SHORT TERM GOAL #1   Title Patient will demonstrate no less than 7 degrees extension and at least 90 degrees flexion in pain free range     Status Achieved   PT SHORT TERM GOAL #2   Title Patient will demonstrate at least 4/5 strength in bilateral lower extremities and all proximal muscle groups    Status Partially Met   PT SHORT TERM GOAL #3   Title Patient will be able to ambulate at least 45 minutes over even and uneven surfaces with single point cane and R knee pain no more than 2/10   Status On-going   PT SHORT TERM GOAL #4   Title Patient will be able to perform functional sit to stand transfer without the use of her upper extremities and R knee pain no more than 2/10   Status Achieved   PT SHORT TERM GOAL #5   Title Patient to demonstrate reduced levels of edema in her R knee and will be able to successfully self-perform appropriate massage technique to reduce edema and patellar mobilizations safely on an independent basis    Status Achieved           PT Long Term Goals - 03/18/15 1845    PT LONG TERM GOAL #1   Title Patient will be independent in advanced HEP and will be able to perform her exercise routine correctly on a consistent basis    Status Achieved   PT LONG TERM GOAL #2   Title Patient will demonstrate no more than 3 degrees R knee extension and will be able to perform R  knee flexion of at least 115 degrees    Status Partially Met   PT LONG TERM GOAL #3   Title Patient will be able to ambulate unlimited distances on level and unlevel surfaces with no assistive device, good balance, and minimal fall risk, R knee pain 0/10   Status On-going   PT LONG TERM GOAL #4   Title Patient will be able to ascend and descend full flight of stairs with U railing, no assistive device, and reciprocal pattern free of compensation strategies    Status On-going   PT LONG TERM GOAL #5   Title Patient will be unlimited in her ability to perform functional tasks and chores around her home with R knee pain 0/10 and no assistive device, minimal rest breaks    Status On-going               Plan - 03/18/15 1839     Clinical Impression Statement Session focus on improving AROM for knee flexion and establishing a plan upon discharge next session.  Reviewed stretches with miin cueing for form for maximal benefits to complete as HEP and educated pt  on continued benefits of joining [employer].  Ended session with manual MFR to reduce fascial restricitns and decrease adhesions perimeter of knee to improve ROM.  No reports of pain through session.   PT Next Visit Plan Anticipate D/C next session per MD.  Make sure pt. has exercises and stretches to complete as HEP.  Continue aggressive PROM to increase knee flexion.          Problem List Patient Active Problem List   Diagnosis Date Noted  . OA (osteoarthritis) of knee 12/13/2014   Ihor Austin, LPTA; CBIS [phone]   Aldona Lento 03/18/2015, 6:48 PM  [facility] [address] [place], [place], [address] Phone: [phone]   Fax:  [phone]

## 2015-03-19 ENCOUNTER — Ambulatory Visit (HOSPITAL_COMMUNITY): Payer: Medicare Other | Admitting: Physical Therapy

## 2015-03-19 DIAGNOSIS — M25561 Pain in right knee: Secondary | ICD-10-CM

## 2015-03-19 DIAGNOSIS — R531 Weakness: Secondary | ICD-10-CM | POA: Diagnosis not present

## 2015-03-19 DIAGNOSIS — Z96651 Presence of right artificial knee joint: Secondary | ICD-10-CM | POA: Diagnosis not present

## 2015-03-19 DIAGNOSIS — M24661 Ankylosis, right knee: Secondary | ICD-10-CM | POA: Diagnosis not present

## 2015-03-19 DIAGNOSIS — R609 Edema, unspecified: Secondary | ICD-10-CM

## 2015-03-19 NOTE — Therapy (Signed)
[place], [place], [address] Phone: [phone]   Fax:  [phone]  Physical Therapy Treatment (discharge)  Patient Details  Name: [name] MRN: [id number] Date of Birth: [birth year] Referring Provider:  Sharilyn Sites, MD  Encounter Date: 03/19/2015      PT End of Session - 03/19/15 1656    Visit Number 38   Number of Visits 39   Date for PT Re-Evaluation 03/19/15   Authorization Type Medicare    Authorization Time Period 02/23/15 to 04/25/15;     Authorization - Visit Number 43   Authorization - Number of Visits 54   PT Start Time 4010   PT Stop Time 1646   PT Time Calculation (min) 43 min   Activity Tolerance Patient tolerated treatment well   Behavior During Therapy WFL for tasks assessed/performed      Past Medical History  Diagnosis Date  . Hypertension   . Hypothyroidism   . Arthritis   . Hyperlipemia   . Wears glasses   . Complication of anesthesia     woke up too early from a surgery  . Bladder spasms     "spastic bladder" wears pads  . GERD (gastroesophageal reflux disease)     occ. frequent "hiccoughs" after "eating, indigestion"  . Trigger finger, left     alternate fingers affected. -Occ. shooting pain.  . Cancer     Skin cancer -scalp and eyebrow"basal cell"    Past Surgical History  Procedure Laterality Date  . Total hip arthroplasty  2006    right  . Total hip arthroplasty  2006    left  . Tonsillectomy    . Appendectomy    . Carpal tunnel release Left   . Thyroidectomy  1994  . Cardiac catheterization  2006    normal-done for work up pre hip surgeries  . Cholecystectomy  1997  . Abdominal hysterectomy  1989  . Polypectomy Bilateral 06/26/2013    Procedure: BILATERAL EXCISION OF NASAL POLYPS ;  Surgeon: Ascencion Dike, MD;  Location: [facility];  Service: ENT;  Laterality: Bilateral;  . Septoplasty N/A 06/26/2013    Procedure: AND SEPTOPLASTY;  Surgeon:  Ascencion Dike, MD;  Location: [facility];  Service: ENT;  Laterality: N/A;  . Joint replacement      BTHA  . Total knee arthroplasty Right 12/13/2014    Procedure: RIGHT TOTAL KNEE ARTHROPLASTY;  Surgeon: Gaynelle Arabian, MD;  Location: [facility];  Service: Orthopedics;  Laterality: Right;    There were no vitals filed for this visit.  Visit Diagnosis:  Knee pain, right  Decreased range of motion of knee, right  Edema  Weakness generalized  Status post total right knee replacement      Subjective Assessment - 03/19/15 1620    Subjective Patient states that she feels like she may have hurt her back a little bit the other day, maybe from working in the garden    Pertinent History Knee replacement surgery was done on 12/13/14; had seen multiple orthopedic specialists. Had both hips replaced around 2006. Has known for at least 4 years that both knees are bone on bone; finally got knee replacement scheduled.  Went to [employer] after the surgery.   How long can you sit comfortably? 7/13-  not limited    How long can you stand comfortably? 7/13- 90 minutes    How long can you walk comfortably? 7/13- at least an  hour, likely more    Patient Stated Goals getting back to total mobility and get rid of assistive device    Currently in Pain? No/denies            [facility] PT Assessment - 03/19/15 0001    Observation/Other Assessments   Focus on Therapeutic Outcomes (FOTO)  29% limited    AROM   Right Knee Extension 2   Right Knee Flexion 98   Strength   Right Knee Flexion 4+/5   Right Knee Extension 5/5   Left Knee Flexion 5/5   Left Knee Extension 5/5   Right Ankle Dorsiflexion 5/5   Left Ankle Dorsiflexion 5/5                     [facility] Adult PT Treatment/Exercise - 03/19/15 0001    Knee/Hip Exercises: Stretches   Active Hamstring Stretch 3 reps;30 seconds   Active Hamstring Stretch Limitations 12in step   Quad Stretch 3 reps;30 seconds   Quad Stretch  Limitations prone with rope    Knee: Self-Stretch to increase Flexion Right;10 seconds   Knee: Self-Stretch Limitations 10 X on 12" box   Gastroc Stretch 3 reps;30 seconds   Gastroc Stretch Limitations slant board                PT Education - 03/19/15 1655    Education provided Yes   Education Details educated regarding continuing witha ctivity and HEP    Person(s) Educated Patient   Methods Explanation   Comprehension Verbalized understanding          PT Short Term Goals - 03/19/15 1638    PT SHORT TERM GOAL #1   Title Patient will demonstrate no less than 7 degrees extension and at least 90 degrees flexion in pain free range    Time 2   Period Weeks   Status Achieved   PT SHORT TERM GOAL #2   Title Patient will demonstrate at least 4/5 strength in bilateral lower extremities and all proximal muscle groups    Time 2   Period Weeks   Status Partially Met   PT SHORT TERM GOAL #3   Title Patient will be able to ambulate at least 45 minutes over even and uneven surfaces with single point cane and R knee pain no more than 2/10   Time 2   Period Weeks   Status Achieved   PT SHORT TERM GOAL #4   Title Patient will be able to perform functional sit to stand transfer without the use of her upper extremities and R knee pain no more than 2/10   Time 2   Period Weeks   Status Achieved   PT SHORT TERM GOAL #5   Title Patient to demonstrate reduced levels of edema in her R knee and will be able to successfully self-perform appropriate massage technique to reduce edema and patellar mobilizations safely on an independent basis    Time 2   Period Weeks   Status Achieved           PT Long Term Goals - 03/19/15 1639    PT LONG TERM GOAL #1   Title Patient will be independent in advanced HEP and will be able to perform her exercise routine correctly on a consistent basis    Time 4   Period Weeks   Status Achieved   PT LONG TERM GOAL #2   Title Patient will demonstrate  no more than 3 degrees R knee extension  and will be able to perform R  knee flexion of at least 115 degrees    Time 4   Period Weeks   Status Achieved   PT LONG TERM GOAL #3   Title Patient will be able to ambulate unlimited distances on level and unlevel surfaces with no assistive device, good balance, and minimal fall risk, R knee pain 0/10   Time 4   Period Weeks   Status Achieved   PT LONG TERM GOAL #4   Title Patient will be able to ascend and descend full flight of stairs with U railing, no assistive device, and reciprocal pattern free of compensation strategies    Time 4   Period Weeks   Status On-going   PT LONG TERM GOAL #5   Title Patient will be unlimited in her ability to perform functional tasks and chores around her home with R knee pain 0/10 and no assistive device, minimal rest breaks    Time 4   Period Weeks   Status Achieved               Plan - 03/22/2015 1657    Clinical Impression Statement Re-assessment performed today. Patient shows significant improvement in ROM and strength in affected knee. Patient states that she is able to do what she needs and wants to do at home but does continue to have difficulty with stair descent but this is likely due to L knee limitations at least in part. DC today due to good progress and patient and MD being satisfied with current level of function.    Pt will benefit from skilled therapeutic intervention in order to improve on the following deficits Abnormal gait;Decreased coordination;Decreased range of motion;Difficulty walking;Impaired flexibility;Improper body mechanics;Decreased endurance;Postural dysfunction;Decreased activity tolerance;Decreased balance;Decreased knowledge of use of DME;Decreased scar mobility;Increased muscle spasms;Pain;Decreased mobility;Decreased strength   Rehab Potential Good   PT Treatment/Interventions ADLs/Self Care Home Management;Gait training;Neuromuscular re-education;Stair training;Functional  mobility training;Patient/family education;Cryotherapy;Therapeutic activities;Therapeutic exercise;Manual techniques;DME Instruction;Balance training   PT Next Visit Plan DC today    PT Home Exercise Plan No changes.    Consulted and Agree with Plan of Care Patient          G-Codes - 22-Mar-2015 1659    Functional Assessment Tool Used FOTO 29% limited    Functional Limitation Mobility: Walking and moving around   Mobility: Walking and Moving Around Goal Status [phone]) At least 20 percent but less than 40 percent impaired, limited or restricted   Mobility: Walking and Moving Around Discharge Status [phone]) At least 20 percent but less than 40 percent impaired, limited or restricted      Problem List Patient Active Problem List   Diagnosis Date Noted  . OA (osteoarthritis) of knee 12/13/2014    PHYSICAL THERAPY DISCHARGE SUMMARY  Visits from Start of Care: 38  Current functional level related to goals / functional outcomes: Patient states that she is virtually able to do all she needs and wants to do; is still having trouble with squatting and stairs however.    Remaining deficits: Difficulty with stair descent and squatting; most limitations may in part be due to L knee problems    Education / Equipment: [employer], keeping active, HEP  Plan: Patient agrees to discharge.  Patient goals were partially met. Patient is being discharged due to being pleased with the current functional level.  ?????       Deniece Ree PT, DPT [address]  [place], [place], [address] Phone: [phone]   Fax:  [phone]

## 2015-04-01 ENCOUNTER — Encounter (HOSPITAL_COMMUNITY): Payer: 59 | Admitting: Physical Therapy

## 2015-04-03 ENCOUNTER — Encounter (HOSPITAL_COMMUNITY): Payer: 59

## 2015-05-21 DIAGNOSIS — Z6832 Body mass index (BMI) 32.0-32.9, adult: Secondary | ICD-10-CM | POA: Diagnosis not present

## 2015-05-21 DIAGNOSIS — E039 Hypothyroidism, unspecified: Secondary | ICD-10-CM | POA: Diagnosis not present

## 2015-05-21 DIAGNOSIS — Z1389 Encounter for screening for other disorder: Secondary | ICD-10-CM | POA: Diagnosis not present

## 2015-05-21 DIAGNOSIS — E6609 Other obesity due to excess calories: Secondary | ICD-10-CM | POA: Diagnosis not present

## 2015-05-21 DIAGNOSIS — E782 Mixed hyperlipidemia: Secondary | ICD-10-CM | POA: Diagnosis not present

## 2015-05-21 DIAGNOSIS — L03818 Cellulitis of other sites: Secondary | ICD-10-CM | POA: Diagnosis not present

## 2015-05-29 DIAGNOSIS — Z1389 Encounter for screening for other disorder: Secondary | ICD-10-CM | POA: Diagnosis not present

## 2015-05-29 DIAGNOSIS — Z6832 Body mass index (BMI) 32.0-32.9, adult: Secondary | ICD-10-CM | POA: Diagnosis not present

## 2015-05-29 DIAGNOSIS — E6609 Other obesity due to excess calories: Secondary | ICD-10-CM | POA: Diagnosis not present

## 2015-05-29 DIAGNOSIS — L03818 Cellulitis of other sites: Secondary | ICD-10-CM | POA: Diagnosis not present

## 2015-06-05 DIAGNOSIS — Z471 Aftercare following joint replacement surgery: Secondary | ICD-10-CM | POA: Diagnosis not present

## 2015-06-05 DIAGNOSIS — Z96651 Presence of right artificial knee joint: Secondary | ICD-10-CM | POA: Diagnosis not present

## 2015-06-19 DIAGNOSIS — M79672 Pain in left foot: Secondary | ICD-10-CM | POA: Diagnosis not present

## 2015-06-19 DIAGNOSIS — M79671 Pain in right foot: Secondary | ICD-10-CM | POA: Diagnosis not present

## 2015-07-17 DIAGNOSIS — H353132 Nonexudative age-related macular degeneration, bilateral, intermediate dry stage: Secondary | ICD-10-CM | POA: Diagnosis not present

## 2015-07-17 DIAGNOSIS — H353122 Nonexudative age-related macular degeneration, left eye, intermediate dry stage: Secondary | ICD-10-CM | POA: Diagnosis not present

## 2015-07-17 DIAGNOSIS — H52223 Regular astigmatism, bilateral: Secondary | ICD-10-CM | POA: Diagnosis not present

## 2015-07-17 DIAGNOSIS — H353112 Nonexudative age-related macular degeneration, right eye, intermediate dry stage: Secondary | ICD-10-CM | POA: Diagnosis not present

## 2015-07-17 DIAGNOSIS — H5213 Myopia, bilateral: Secondary | ICD-10-CM | POA: Diagnosis not present

## 2015-07-17 DIAGNOSIS — H25813 Combined forms of age-related cataract, bilateral: Secondary | ICD-10-CM | POA: Diagnosis not present

## 2015-07-25 DIAGNOSIS — Z23 Encounter for immunization: Secondary | ICD-10-CM | POA: Diagnosis not present

## 2015-08-28 DIAGNOSIS — N3946 Mixed incontinence: Secondary | ICD-10-CM | POA: Diagnosis not present

## 2015-08-28 DIAGNOSIS — R350 Frequency of micturition: Secondary | ICD-10-CM | POA: Diagnosis not present

## 2015-09-09 DIAGNOSIS — R350 Frequency of micturition: Secondary | ICD-10-CM | POA: Diagnosis not present

## 2015-09-09 DIAGNOSIS — H353130 Nonexudative age-related macular degeneration, bilateral, stage unspecified: Secondary | ICD-10-CM | POA: Diagnosis not present

## 2015-09-09 DIAGNOSIS — H18412 Arcus senilis, left eye: Secondary | ICD-10-CM | POA: Diagnosis not present

## 2015-09-09 DIAGNOSIS — H2511 Age-related nuclear cataract, right eye: Secondary | ICD-10-CM | POA: Diagnosis not present

## 2015-09-09 DIAGNOSIS — H02839 Dermatochalasis of unspecified eye, unspecified eyelid: Secondary | ICD-10-CM | POA: Diagnosis not present

## 2015-09-09 DIAGNOSIS — N3946 Mixed incontinence: Secondary | ICD-10-CM | POA: Diagnosis not present

## 2015-09-09 DIAGNOSIS — H18411 Arcus senilis, right eye: Secondary | ICD-10-CM | POA: Diagnosis not present

## 2015-09-18 DIAGNOSIS — M79671 Pain in right foot: Secondary | ICD-10-CM | POA: Diagnosis not present

## 2015-09-18 DIAGNOSIS — M79672 Pain in left foot: Secondary | ICD-10-CM | POA: Diagnosis not present

## 2015-09-23 DIAGNOSIS — R350 Frequency of micturition: Secondary | ICD-10-CM | POA: Diagnosis not present

## 2015-09-23 DIAGNOSIS — N3946 Mixed incontinence: Secondary | ICD-10-CM | POA: Diagnosis not present

## 2015-09-30 DIAGNOSIS — R350 Frequency of micturition: Secondary | ICD-10-CM | POA: Diagnosis not present

## 2015-10-06 DIAGNOSIS — N63 Unspecified lump in breast: Secondary | ICD-10-CM | POA: Diagnosis not present

## 2015-10-06 DIAGNOSIS — Z1231 Encounter for screening mammogram for malignant neoplasm of breast: Secondary | ICD-10-CM | POA: Diagnosis not present

## 2015-10-06 DIAGNOSIS — R922 Inconclusive mammogram: Secondary | ICD-10-CM | POA: Diagnosis not present

## 2015-10-06 DIAGNOSIS — R928 Other abnormal and inconclusive findings on diagnostic imaging of breast: Secondary | ICD-10-CM | POA: Diagnosis not present

## 2015-10-06 DIAGNOSIS — Z09 Encounter for follow-up examination after completed treatment for conditions other than malignant neoplasm: Secondary | ICD-10-CM | POA: Diagnosis not present

## 2015-10-06 DIAGNOSIS — Z87898 Personal history of other specified conditions: Secondary | ICD-10-CM | POA: Diagnosis not present

## 2015-10-06 DIAGNOSIS — N6019 Diffuse cystic mastopathy of unspecified breast: Secondary | ICD-10-CM | POA: Diagnosis not present

## 2015-10-07 DIAGNOSIS — R350 Frequency of micturition: Secondary | ICD-10-CM | POA: Diagnosis not present

## 2015-10-07 DIAGNOSIS — N3946 Mixed incontinence: Secondary | ICD-10-CM | POA: Diagnosis not present

## 2015-10-14 DIAGNOSIS — R350 Frequency of micturition: Secondary | ICD-10-CM | POA: Diagnosis not present

## 2015-10-14 DIAGNOSIS — N3946 Mixed incontinence: Secondary | ICD-10-CM | POA: Diagnosis not present

## 2015-10-21 DIAGNOSIS — N3946 Mixed incontinence: Secondary | ICD-10-CM | POA: Diagnosis not present

## 2015-10-21 DIAGNOSIS — R351 Nocturia: Secondary | ICD-10-CM | POA: Diagnosis not present

## 2015-10-21 DIAGNOSIS — R350 Frequency of micturition: Secondary | ICD-10-CM | POA: Diagnosis not present

## 2015-10-27 DIAGNOSIS — Z6833 Body mass index (BMI) 33.0-33.9, adult: Secondary | ICD-10-CM | POA: Diagnosis not present

## 2015-10-27 DIAGNOSIS — Z0000 Encounter for general adult medical examination without abnormal findings: Secondary | ICD-10-CM | POA: Diagnosis not present

## 2015-10-27 DIAGNOSIS — Z1389 Encounter for screening for other disorder: Secondary | ICD-10-CM | POA: Diagnosis not present

## 2015-10-27 DIAGNOSIS — E6609 Other obesity due to excess calories: Secondary | ICD-10-CM | POA: Diagnosis not present

## 2015-10-28 DIAGNOSIS — R350 Frequency of micturition: Secondary | ICD-10-CM | POA: Diagnosis not present

## 2015-10-28 DIAGNOSIS — N3946 Mixed incontinence: Secondary | ICD-10-CM | POA: Diagnosis not present

## 2015-11-03 DIAGNOSIS — H25811 Combined forms of age-related cataract, right eye: Secondary | ICD-10-CM | POA: Diagnosis not present

## 2015-11-03 DIAGNOSIS — H2511 Age-related nuclear cataract, right eye: Secondary | ICD-10-CM | POA: Diagnosis not present

## 2015-11-04 DIAGNOSIS — N3946 Mixed incontinence: Secondary | ICD-10-CM | POA: Diagnosis not present

## 2015-11-04 DIAGNOSIS — H2512 Age-related nuclear cataract, left eye: Secondary | ICD-10-CM | POA: Diagnosis not present

## 2015-11-11 DIAGNOSIS — N3946 Mixed incontinence: Secondary | ICD-10-CM | POA: Diagnosis not present

## 2015-11-18 DIAGNOSIS — N3946 Mixed incontinence: Secondary | ICD-10-CM | POA: Diagnosis not present

## 2015-11-18 DIAGNOSIS — R350 Frequency of micturition: Secondary | ICD-10-CM | POA: Diagnosis not present

## 2015-11-24 DIAGNOSIS — H25812 Combined forms of age-related cataract, left eye: Secondary | ICD-10-CM | POA: Diagnosis not present

## 2015-11-24 DIAGNOSIS — H2512 Age-related nuclear cataract, left eye: Secondary | ICD-10-CM | POA: Diagnosis not present

## 2015-11-25 DIAGNOSIS — N3946 Mixed incontinence: Secondary | ICD-10-CM | POA: Diagnosis not present

## 2015-11-25 DIAGNOSIS — R350 Frequency of micturition: Secondary | ICD-10-CM | POA: Diagnosis not present

## 2015-12-02 DIAGNOSIS — R350 Frequency of micturition: Secondary | ICD-10-CM | POA: Diagnosis not present

## 2015-12-02 DIAGNOSIS — N3946 Mixed incontinence: Secondary | ICD-10-CM | POA: Diagnosis not present

## 2015-12-04 DIAGNOSIS — Z96643 Presence of artificial hip joint, bilateral: Secondary | ICD-10-CM | POA: Diagnosis not present

## 2015-12-04 DIAGNOSIS — Z96651 Presence of right artificial knee joint: Secondary | ICD-10-CM | POA: Diagnosis not present

## 2015-12-04 DIAGNOSIS — Z471 Aftercare following joint replacement surgery: Secondary | ICD-10-CM | POA: Diagnosis not present

## 2015-12-08 ENCOUNTER — Encounter (HOSPITAL_COMMUNITY): Payer: Self-pay

## 2015-12-18 DIAGNOSIS — M79671 Pain in right foot: Secondary | ICD-10-CM | POA: Diagnosis not present

## 2015-12-18 DIAGNOSIS — M79672 Pain in left foot: Secondary | ICD-10-CM | POA: Diagnosis not present

## 2015-12-18 DIAGNOSIS — B351 Tinea unguium: Secondary | ICD-10-CM | POA: Diagnosis not present

## 2015-12-23 DIAGNOSIS — N3946 Mixed incontinence: Secondary | ICD-10-CM | POA: Diagnosis not present

## 2016-01-13 DIAGNOSIS — N3946 Mixed incontinence: Secondary | ICD-10-CM | POA: Diagnosis not present

## 2016-01-15 DIAGNOSIS — E782 Mixed hyperlipidemia: Secondary | ICD-10-CM | POA: Diagnosis not present

## 2016-01-15 DIAGNOSIS — E6609 Other obesity due to excess calories: Secondary | ICD-10-CM | POA: Diagnosis not present

## 2016-01-15 DIAGNOSIS — E063 Autoimmune thyroiditis: Secondary | ICD-10-CM | POA: Diagnosis not present

## 2016-01-15 DIAGNOSIS — Z1389 Encounter for screening for other disorder: Secondary | ICD-10-CM | POA: Diagnosis not present

## 2016-01-15 DIAGNOSIS — I10 Essential (primary) hypertension: Secondary | ICD-10-CM | POA: Diagnosis not present

## 2016-01-15 DIAGNOSIS — Z6835 Body mass index (BMI) 35.0-35.9, adult: Secondary | ICD-10-CM | POA: Diagnosis not present

## 2016-01-16 DIAGNOSIS — E063 Autoimmune thyroiditis: Secondary | ICD-10-CM | POA: Diagnosis not present

## 2016-01-16 DIAGNOSIS — E782 Mixed hyperlipidemia: Secondary | ICD-10-CM | POA: Diagnosis not present

## 2016-01-30 DIAGNOSIS — Z1211 Encounter for screening for malignant neoplasm of colon: Secondary | ICD-10-CM | POA: Diagnosis not present

## 2016-02-03 DIAGNOSIS — N3946 Mixed incontinence: Secondary | ICD-10-CM | POA: Diagnosis not present

## 2016-02-03 DIAGNOSIS — R350 Frequency of micturition: Secondary | ICD-10-CM | POA: Diagnosis not present

## 2016-02-20 ENCOUNTER — Emergency Department (HOSPITAL_COMMUNITY)
Admission: EM | Admit: 2016-02-20 | Discharge: 2016-02-20 | Disposition: A | Discharge status: Home / Self Care | Payer: Medicare Other | Attending: Emergency Medicine | Admitting: Emergency Medicine

## 2016-02-20 ENCOUNTER — Encounter (HOSPITAL_COMMUNITY): Payer: Self-pay

## 2016-02-20 DIAGNOSIS — W57XXXA Bitten or stung by nonvenomous insect and other nonvenomous arthropods, initial encounter: Secondary | ICD-10-CM | POA: Diagnosis not present

## 2016-02-20 DIAGNOSIS — Z7982 Long term (current) use of aspirin: Secondary | ICD-10-CM | POA: Insufficient documentation

## 2016-02-20 DIAGNOSIS — Y999 Unspecified external cause status: Secondary | ICD-10-CM | POA: Insufficient documentation

## 2016-02-20 DIAGNOSIS — C4441 Basal cell carcinoma of skin of scalp and neck: Secondary | ICD-10-CM | POA: Diagnosis not present

## 2016-02-20 DIAGNOSIS — Y929 Unspecified place or not applicable: Secondary | ICD-10-CM | POA: Insufficient documentation

## 2016-02-20 DIAGNOSIS — Z79899 Other long term (current) drug therapy: Secondary | ICD-10-CM | POA: Insufficient documentation

## 2016-02-20 DIAGNOSIS — E785 Hyperlipidemia, unspecified: Secondary | ICD-10-CM | POA: Diagnosis not present

## 2016-02-20 DIAGNOSIS — S80262A Insect bite (nonvenomous), left knee, initial encounter: Secondary | ICD-10-CM | POA: Insufficient documentation

## 2016-02-20 DIAGNOSIS — Y939 Activity, unspecified: Secondary | ICD-10-CM | POA: Diagnosis not present

## 2016-02-20 DIAGNOSIS — I10 Essential (primary) hypertension: Secondary | ICD-10-CM | POA: Insufficient documentation

## 2016-02-20 MED ORDER — BACITRACIN ZINC 500 UNIT/GM EX OINT
TOPICAL_OINTMENT | CUTANEOUS | Status: AC
  Filled 2016-02-20: qty 2.7

## 2016-02-20 NOTE — ED Notes (Signed)
Pt has a tick on back of left leg she wants removed.

## 2016-02-20 NOTE — ED Provider Notes (Signed)
CSN: [id number]     Arrival date & time 02/20/16  1806 History   First MD Initiated Contact with Patient 02/20/16 1940     Chief Complaint  Patient presents with  . Tick Removal     (Consider location/radiation/quality/duration/timing/severity/associated sxs/prior Treatment) HPI [name] is a 75 y.o. female who presents to the ED with a tick to the posterior aspect of the left knee. She is unsure how long it has been there but it is starting to swell. She denies fever, headache or any other problems. Patient has been working outside for several day and not sure when it bit her. Patient states she did water aerobics for an hour today and then noticed the tick.   Past Medical History  Diagnosis Date  . Hypertension   . Hypothyroidism   . Arthritis   . Hyperlipemia   . Wears glasses   . Complication of anesthesia     woke up too early from a surgery  . Bladder spasms     "spastic bladder" wears pads  . GERD (gastroesophageal reflux disease)     occ. frequent "hiccoughs" after "eating, indigestion"  . Trigger finger, left     alternate fingers affected. -Occ. shooting pain.  . Cancer ([place])     Skin cancer -scalp and eyebrow"basal cell"   Past Surgical History  Procedure Laterality Date  . Total hip arthroplasty  2006    right  . Total hip arthroplasty  2006    left  . Tonsillectomy    . Appendectomy    . Carpal tunnel release Left   . Thyroidectomy  1994  . Cardiac catheterization  2006    normal-done for work up pre hip surgeries  . Cholecystectomy  1997  . Abdominal hysterectomy  1989  . Polypectomy Bilateral 06/26/2013    Procedure: BILATERAL EXCISION OF NASAL POLYPS ;  Surgeon: Ascencion Dike, MD;  Location: [facility];  Service: ENT;  Laterality: Bilateral;  . Septoplasty N/A 06/26/2013    Procedure: AND SEPTOPLASTY;  Surgeon: Ascencion Dike, MD;  Location: [facility];  Service: ENT;  Laterality: N/A;  . Joint replacement      BTHA  .  Total knee arthroplasty Right 12/13/2014    Procedure: RIGHT TOTAL KNEE ARTHROPLASTY;  Surgeon: Gaynelle Arabian, MD;  Location: [facility];  Service: Orthopedics;  Laterality: Right;   No family history on file. Social History  Substance Use Topics  . Smoking status: Never Smoker   . Smokeless tobacco: None  . Alcohol Use: No   OB History    No data available     Review of Systems Negative except as stated in HPI   Allergies  Feldene; Ivp dye; Naproxen; Sulfa antibiotics; and Vicodin  Home Medications   Prior to Admission medications   Medication Sig Start Date End Date Taking? Authorizing Provider  acetaminophen (TYLENOL) 325 MG tablet Take 2 tablets (650 mg total) by mouth every 6 (six) hours as needed for mild pain (or Fever >/= 101). 12/16/14   Arlee Muslim, PA-C  aspirin 325 MG EC tablet Take 325 mg by mouth daily. Start when finish with last of Xarelto meds. PO QD prophalactic    Historical Provider, MD  bisacodyl (DULCOLAX) 10 MG suppository Place 1 suppository (10 mg total) rectally daily as needed for moderate constipation. 12/16/14   Arlee Muslim, PA-C  docusate sodium (COLACE) 100 MG capsule Take 1 capsule (100 mg total) by mouth 2 (two) times daily.  12/16/14   Arlee Muslim, PA-C  fesoterodine (TOVIAZ) 8 MG TB24 tablet Take 8 mg by mouth daily. Takes at bedtime    Historical Provider, MD  levothyroxine (SYNTHROID, LEVOTHROID) 125 MCG tablet Take 125 mcg by mouth daily before breakfast.    Historical Provider, MD  methocarbamol (ROBAXIN) 500 MG tablet Take 1 tablet (500 mg total) by mouth every 6 (six) hours as needed for muscle spasms. 12/16/14   Arlee Muslim, PA-C  metoCLOPramide (REGLAN) 5 MG tablet Take 1 tablet (5 mg total) by mouth every 8 (eight) hours as needed for nausea (if ondansetron (ZOFRAN) ineffective.). 12/16/14   Arlee Muslim, PA-C  metoprolol (LOPRESSOR) 50 MG tablet Take 50 mg by mouth 2 (two) times daily.    Historical Provider, MD  Multiple Vitamins-Minerals  (PRESERVISION AREDS 2 PO) Take 1 tablet by mouth 2 (two) times daily.    Historical Provider, MD  ondansetron (ZOFRAN) 4 MG tablet Take 1 tablet (4 mg total) by mouth every 6 (six) hours as needed for nausea. 12/16/14   Arlee Muslim, PA-C  oxyCODONE (OXY IR/ROXICODONE) 5 MG immediate release tablet Take 1-2 tablets (5-10 mg total) by mouth every 3 (three) hours as needed for moderate pain, severe pain or breakthrough pain. 12/16/14   Arlee Muslim, PA-C  polyethylene glycol [facility] / GLYCOLAX) packet Take 17 g by mouth daily as needed for mild constipation. 12/16/14   Arlee Muslim, PA-C  rivaroxaban (XARELTO) 10 MG TABS tablet Take 1 tablet (10 mg total) by mouth daily with breakfast. Take Xarelto for two and a half more weeks, then discontinue Xarelto. Once the patient has completed the Xarelto, they may resume the 325 mg Aspirin. 12/16/14   Arlee Muslim, PA-C  traMADol (ULTRAM) 50 MG tablet Take 1-2 tablets (50-100 mg total) by mouth every 6 (six) hours as needed (mild pain). 12/16/14   Arlee Muslim, PA-C   BP 152/96 mmHg  Pulse 57  Temp(Src) 98.8 F (37.1 C) (Oral)  Resp 15  Ht 5\' 2"  (1.575 m)  Wt 89.359 kg  BMI 36.02 kg/m2  SpO2 100% Physical Exam  Constitutional: She is oriented to person, place, and time. She appears well-developed and well-nourished.  HENT:  Head: Normocephalic and atraumatic.  Eyes: EOM are normal.  Neck: Neck supple.  Cardiovascular: Normal rate.   Pulmonary/Chest: Effort normal.  Musculoskeletal: Normal range of motion.       Legs: Tick noted to the posterior aspect of the left knee. No erythema or red streaking noted.   Neurological: She is alert and oriented to person, place, and time. No cranial nerve deficit.  Skin: Skin is warm and dry.  Psychiatric: She has a normal mood and affect. Her behavior is normal.  Nursing note and vitals reviewed.   ED Course  Procedures (including critical care time)  Area of tick bite cleaned with alcohol Using sterile  tweezers I was able to release the head of the tick from the skin. The tick was fully intact after removal. Wound examined and there is no red streaking and no foreign body in the wound.  Bacitracin ointment and dressing appliled.    MDM  75 y.o. female with tick bite to the posterior left knee stable for d/c without signs of infection. Patient to return for any signs of RMSF. Instructions on tick bite given on d/c.   Final diagnoses:  Tick bite       [facility], NP 02/20/16 [id number]  Ezequiel Essex, MD 02/21/16 [phone]

## 2016-02-20 NOTE — ED Notes (Signed)
Pt states she found a tick on the back of her left knee today, tried to have someone remove it but stated it was "burrowed". Reports she has been working outside for several days.

## 2016-02-20 NOTE — Discharge Instructions (Signed)
Clean the wound and use the antibiotic ointment two times a day.   Tick Bite Information Ticks are insects that attach themselves to the skin. There are many types of ticks. Common types include wood ticks and deer ticks. Sometimes, ticks carry diseases that can make a person very ill. The most common places for ticks to attach themselves are the scalp, neck, armpits, waist, and groin.  HOW CAN YOU PREVENT TICK BITES? Take these steps to help prevent tick bites when you are outdoors:  Wear long sleeves and long pants.  Wear white clothes so you can see ticks more easily.  Tuck your pant legs into your socks.  If walking on a trail, stay in the middle of the trail to avoid brushing against bushes.  Avoid walking through areas with long grass.  Put bug spray on all skin that is showing and along boot tops, pant legs, and sleeve cuffs.  Check clothes, hair, and skin often and before going inside.  Brush off any ticks that are not attached.  Take a shower or bath as soon as possible after being outdoors. HOW SHOULD YOU REMOVE A TICK? Ticks should be removed as soon as possible to help prevent diseases. 1. If latex gloves are available, put them on before trying to remove a tick. 2. Use tweezers to grasp the tick as close to the skin as possible. You may also use curved forceps or a tick removal tool. Grasp the tick as close to its head as possible. Avoid grasping the tick on its body. 3. Pull gently upward until the tick lets go. Do not twist the tick or jerk it suddenly. This may break off the tick's head or mouth parts. 4. Do not squeeze or crush the tick's body. This could force disease-carrying fluids from the tick into your body. 5. After the tick is removed, wash the bite area and your hands with soap and water or alcohol. 6. Apply a small amount of antiseptic cream or ointment to the bite site. 7. Wash any tools that were used. Do not try to remove a tick by applying a hot match,  petroleum jelly, or fingernail polish to the tick. These methods do not work. They may also increase the chances of disease being spread from the tick bite. WHEN SHOULD YOU SEEK HELP? Contact your health care provider if you are unable to remove a tick or if a part of the tick breaks off in the skin. After a tick bite, you need to watch for signs and symptoms of diseases that can be spread by ticks. Contact your health care provider if you develop any of the following:  Fever.  Rash.  Redness and puffiness (swelling) in the area of the tick bite.  Tender, puffy lymph glands.  Watery poop (diarrhea).  Weight loss.  Cough.  Feeling more tired than normal (fatigue).  Muscle, joint, or bone pain.  Belly (abdominal) pain.  Headache.  Change in your level of consciousness.  Trouble walking or moving your legs.  Loss of feeling (numbness) in the legs.  Loss of movement (paralysis).  Shortness of breath.  Confusion.  Throwing up (vomiting) many times.   This information is not intended to replace advice given to you by your health care provider. Make sure you discuss any questions you have with your health care provider.   Document Released: 11/17/2009 Document Revised: 04/25/2013 Document Reviewed: 01/31/2013 Elsevier Interactive Patient Education [employer].

## 2016-02-27 DIAGNOSIS — R350 Frequency of micturition: Secondary | ICD-10-CM | POA: Diagnosis not present

## 2016-03-15 DIAGNOSIS — M79671 Pain in right foot: Secondary | ICD-10-CM | POA: Diagnosis not present

## 2016-03-15 DIAGNOSIS — B351 Tinea unguium: Secondary | ICD-10-CM | POA: Diagnosis not present

## 2016-03-15 DIAGNOSIS — M79672 Pain in left foot: Secondary | ICD-10-CM | POA: Diagnosis not present

## 2016-03-15 DIAGNOSIS — L97512 Non-pressure chronic ulcer of other part of right foot with fat layer exposed: Secondary | ICD-10-CM | POA: Diagnosis not present

## 2016-03-19 DIAGNOSIS — R350 Frequency of micturition: Secondary | ICD-10-CM | POA: Diagnosis not present

## 2016-04-08 DIAGNOSIS — R350 Frequency of micturition: Secondary | ICD-10-CM | POA: Diagnosis not present

## 2016-04-12 DIAGNOSIS — I10 Essential (primary) hypertension: Secondary | ICD-10-CM | POA: Diagnosis not present

## 2016-04-12 DIAGNOSIS — N63 Unspecified lump in breast: Secondary | ICD-10-CM | POA: Diagnosis not present

## 2016-04-12 DIAGNOSIS — Z79899 Other long term (current) drug therapy: Secondary | ICD-10-CM | POA: Diagnosis not present

## 2016-04-12 DIAGNOSIS — R928 Other abnormal and inconclusive findings on diagnostic imaging of breast: Secondary | ICD-10-CM | POA: Diagnosis not present

## 2016-04-12 DIAGNOSIS — Z7982 Long term (current) use of aspirin: Secondary | ICD-10-CM | POA: Diagnosis not present

## 2016-04-21 DIAGNOSIS — R928 Other abnormal and inconclusive findings on diagnostic imaging of breast: Secondary | ICD-10-CM | POA: Diagnosis not present

## 2016-04-29 DIAGNOSIS — R350 Frequency of micturition: Secondary | ICD-10-CM | POA: Diagnosis not present

## 2016-05-21 DIAGNOSIS — R350 Frequency of micturition: Secondary | ICD-10-CM | POA: Diagnosis not present

## 2016-05-31 DIAGNOSIS — M79671 Pain in right foot: Secondary | ICD-10-CM | POA: Diagnosis not present

## 2016-05-31 DIAGNOSIS — M79672 Pain in left foot: Secondary | ICD-10-CM | POA: Diagnosis not present

## 2016-05-31 DIAGNOSIS — L97512 Non-pressure chronic ulcer of other part of right foot with fat layer exposed: Secondary | ICD-10-CM | POA: Diagnosis not present

## 2016-05-31 DIAGNOSIS — B351 Tinea unguium: Secondary | ICD-10-CM | POA: Diagnosis not present

## 2016-06-17 DIAGNOSIS — R350 Frequency of micturition: Secondary | ICD-10-CM | POA: Diagnosis not present

## 2016-06-24 DIAGNOSIS — M65332 Trigger finger, left middle finger: Secondary | ICD-10-CM | POA: Diagnosis not present

## 2016-06-24 DIAGNOSIS — M65322 Trigger finger, left index finger: Secondary | ICD-10-CM | POA: Diagnosis not present

## 2016-07-08 DIAGNOSIS — R350 Frequency of micturition: Secondary | ICD-10-CM | POA: Diagnosis not present

## 2016-07-22 DIAGNOSIS — L255 Unspecified contact dermatitis due to plants, except food: Secondary | ICD-10-CM | POA: Diagnosis not present

## 2016-08-05 DIAGNOSIS — N3946 Mixed incontinence: Secondary | ICD-10-CM | POA: Diagnosis not present

## 2016-08-05 DIAGNOSIS — M65322 Trigger finger, left index finger: Secondary | ICD-10-CM | POA: Diagnosis not present

## 2016-08-05 DIAGNOSIS — M65332 Trigger finger, left middle finger: Secondary | ICD-10-CM | POA: Diagnosis not present

## 2016-08-06 DIAGNOSIS — Z23 Encounter for immunization: Secondary | ICD-10-CM | POA: Diagnosis not present

## 2016-08-26 ENCOUNTER — Ambulatory Visit (INDEPENDENT_AMBULATORY_CARE_PROVIDER_SITE_OTHER): Payer: Medicare Other | Admitting: Orthopedic Surgery

## 2016-08-26 VITALS — Ht 62.0 in | Wt 197.0 lb

## 2016-08-26 DIAGNOSIS — B351 Tinea unguium: Secondary | ICD-10-CM | POA: Diagnosis not present

## 2016-08-26 NOTE — Progress Notes (Signed)
Office Visit Note   Patient: [name]           Date of Birth: Mar 27, 1941           MRN: [id number] Visit Date: 08/26/2016              Requested by: Sharilyn Sites, MD [address] [place], [place] [address] PCP: Purvis Kilts, MD   Assessment & Plan: Visit Diagnoses: No diagnosis found.  Plan: Nails trimmed 10 without complications. [place]. 3. Plan follow up in 3 months  Follow-Up Instructions: No Follow-up on file.   Orders:  No orders of the defined types were placed in this encounter.  No orders of the defined types were placed in this encounter.     Procedures: No procedures performed   Clinical Data: No additional findings.   Subjective: Chief Complaint  Patient presents with  . Left Foot - Nail Problem  . Right Foot - Nail Problem    Bilateral nail trim    Review of Systems   Objective: Vital Signs: Ht 5\' 2"  (1.575 m)   Wt 197 lb (89.4 kg)   BMI 36.03 kg/m   Physical Exam on examination patient is alert oriented no adenopathy well-dressed normal affect normal respiratory effort she does have an antalgic gait. Examination she has good pulses in both feet. Her feet are neurovascularly intact. She has thick and discolored onychomycotic nails 10 which she is unable safely trim the nails on her own.  Ortho Exam  Specialty Comments:  No specialty comments available.  Imaging: No results found.   PMFS History: Patient Active Problem List   Diagnosis Date Noted  . OA (osteoarthritis) of knee 12/13/2014   Past Medical History:  Diagnosis Date  . Arthritis   . Bladder spasms    "spastic bladder" wears pads  . Cancer ([place])    Skin cancer -scalp and eyebrow"basal cell"  . Complication of anesthesia    woke up too early from a surgery  . GERD (gastroesophageal reflux disease)    occ. frequent "hiccoughs" after "eating, indigestion"  . Hyperlipemia   . Hypertension   . Hypothyroidism   . Trigger finger, left    alternate fingers affected. -Occ. shooting pain.  . Wears glasses     No family history on file.  Past Surgical History:  Procedure Laterality Date  . ABDOMINAL HYSTERECTOMY  1989  . APPENDECTOMY    . CARDIAC CATHETERIZATION  2006   normal-done for work up pre hip surgeries  . CARPAL TUNNEL RELEASE Left   . CHOLECYSTECTOMY  1997  . JOINT REPLACEMENT     BTHA  . POLYPECTOMY Bilateral 06/26/2013   Procedure: BILATERAL EXCISION OF NASAL POLYPS ;  Surgeon: Ascencion Dike, MD;  Location: [facility];  Service: ENT;  Laterality: Bilateral;  . SEPTOPLASTY N/A 06/26/2013   Procedure: AND SEPTOPLASTY;  Surgeon: Ascencion Dike, MD;  Location: [facility];  Service: ENT;  Laterality: N/A;  . THYROIDECTOMY  1994  . TONSILLECTOMY    . TOTAL HIP ARTHROPLASTY  2006   right  . TOTAL HIP ARTHROPLASTY  2006   left  . TOTAL KNEE ARTHROPLASTY Right 12/13/2014   Procedure: RIGHT TOTAL KNEE ARTHROPLASTY;  Surgeon: Gaynelle Arabian, MD;  [facility];  Service: Orthopedics;  Laterality: Right;   Social History   Occupational History  . Not on file.   Social History Main Topics  . Smoking status: Never Smoker  . Smokeless tobacco: Not  on file  . Alcohol use No  . Drug use: No  . Sexual activity: Not Currently

## 2016-08-31 DIAGNOSIS — R350 Frequency of micturition: Secondary | ICD-10-CM | POA: Diagnosis not present

## 2016-09-21 DIAGNOSIS — R350 Frequency of micturition: Secondary | ICD-10-CM | POA: Diagnosis not present

## 2016-09-27 DIAGNOSIS — E6609 Other obesity due to excess calories: Secondary | ICD-10-CM | POA: Diagnosis not present

## 2016-09-27 DIAGNOSIS — Z6835 Body mass index (BMI) 35.0-35.9, adult: Secondary | ICD-10-CM | POA: Diagnosis not present

## 2016-09-27 DIAGNOSIS — I10 Essential (primary) hypertension: Secondary | ICD-10-CM | POA: Diagnosis not present

## 2016-09-27 DIAGNOSIS — Z1389 Encounter for screening for other disorder: Secondary | ICD-10-CM | POA: Diagnosis not present

## 2016-09-27 DIAGNOSIS — E782 Mixed hyperlipidemia: Secondary | ICD-10-CM | POA: Diagnosis not present

## 2016-09-27 DIAGNOSIS — E559 Vitamin D deficiency, unspecified: Secondary | ICD-10-CM | POA: Diagnosis not present

## 2016-10-12 DIAGNOSIS — R350 Frequency of micturition: Secondary | ICD-10-CM | POA: Diagnosis not present

## 2016-10-25 DIAGNOSIS — N63 Unspecified lump in unspecified breast: Secondary | ICD-10-CM | POA: Diagnosis not present

## 2016-10-25 DIAGNOSIS — R921 Mammographic calcification found on diagnostic imaging of breast: Secondary | ICD-10-CM | POA: Diagnosis not present

## 2016-10-25 DIAGNOSIS — N6324 Unspecified lump in the left breast, lower inner quadrant: Secondary | ICD-10-CM | POA: Diagnosis not present

## 2016-10-25 DIAGNOSIS — N6452 Nipple discharge: Secondary | ICD-10-CM | POA: Diagnosis not present

## 2016-10-25 DIAGNOSIS — R928 Other abnormal and inconclusive findings on diagnostic imaging of breast: Secondary | ICD-10-CM | POA: Diagnosis not present

## 2016-11-01 DIAGNOSIS — R928 Other abnormal and inconclusive findings on diagnostic imaging of breast: Secondary | ICD-10-CM | POA: Diagnosis not present

## 2016-11-01 DIAGNOSIS — Z170 Estrogen receptor positive status [ER+]: Secondary | ICD-10-CM | POA: Diagnosis not present

## 2016-11-01 DIAGNOSIS — C50812 Malignant neoplasm of overlapping sites of left female breast: Secondary | ICD-10-CM | POA: Diagnosis not present

## 2016-11-09 DIAGNOSIS — R350 Frequency of micturition: Secondary | ICD-10-CM | POA: Diagnosis not present

## 2016-11-10 DIAGNOSIS — Z170 Estrogen receptor positive status [ER+]: Secondary | ICD-10-CM | POA: Diagnosis not present

## 2016-11-10 DIAGNOSIS — Z79899 Other long term (current) drug therapy: Secondary | ICD-10-CM | POA: Diagnosis not present

## 2016-11-10 DIAGNOSIS — N6452 Nipple discharge: Secondary | ICD-10-CM | POA: Diagnosis not present

## 2016-11-10 DIAGNOSIS — C50912 Malignant neoplasm of unspecified site of left female breast: Secondary | ICD-10-CM | POA: Diagnosis not present

## 2016-11-10 DIAGNOSIS — I10 Essential (primary) hypertension: Secondary | ICD-10-CM | POA: Diagnosis not present

## 2016-11-10 DIAGNOSIS — Z7982 Long term (current) use of aspirin: Secondary | ICD-10-CM | POA: Diagnosis not present

## 2016-11-12 DIAGNOSIS — E890 Postprocedural hypothyroidism: Secondary | ICD-10-CM | POA: Diagnosis not present

## 2016-11-12 DIAGNOSIS — C50912 Malignant neoplasm of unspecified site of left female breast: Secondary | ICD-10-CM | POA: Diagnosis not present

## 2016-11-12 DIAGNOSIS — Z79899 Other long term (current) drug therapy: Secondary | ICD-10-CM | POA: Diagnosis not present

## 2016-11-12 DIAGNOSIS — I10 Essential (primary) hypertension: Secondary | ICD-10-CM | POA: Diagnosis not present

## 2016-11-12 DIAGNOSIS — N6082 Other benign mammary dysplasias of left breast: Secondary | ICD-10-CM | POA: Diagnosis not present

## 2016-11-12 DIAGNOSIS — R001 Bradycardia, unspecified: Secondary | ICD-10-CM | POA: Diagnosis not present

## 2016-11-12 DIAGNOSIS — L821 Other seborrheic keratosis: Secondary | ICD-10-CM | POA: Diagnosis not present

## 2016-11-12 DIAGNOSIS — M8580 Other specified disorders of bone density and structure, unspecified site: Secondary | ICD-10-CM | POA: Diagnosis not present

## 2016-11-12 DIAGNOSIS — E785 Hyperlipidemia, unspecified: Secondary | ICD-10-CM | POA: Diagnosis not present

## 2016-11-16 DIAGNOSIS — C50112 Malignant neoplasm of central portion of left female breast: Secondary | ICD-10-CM | POA: Diagnosis not present

## 2016-11-16 DIAGNOSIS — I10 Essential (primary) hypertension: Secondary | ICD-10-CM | POA: Diagnosis not present

## 2016-11-16 DIAGNOSIS — M8580 Other specified disorders of bone density and structure, unspecified site: Secondary | ICD-10-CM | POA: Diagnosis not present

## 2016-11-16 DIAGNOSIS — C50812 Malignant neoplasm of overlapping sites of left female breast: Secondary | ICD-10-CM | POA: Diagnosis not present

## 2016-11-16 DIAGNOSIS — L821 Other seborrheic keratosis: Secondary | ICD-10-CM | POA: Diagnosis not present

## 2016-11-16 DIAGNOSIS — N6092 Unspecified benign mammary dysplasia of left breast: Secondary | ICD-10-CM | POA: Diagnosis not present

## 2016-11-16 DIAGNOSIS — C50912 Malignant neoplasm of unspecified site of left female breast: Secondary | ICD-10-CM | POA: Diagnosis not present

## 2016-11-16 DIAGNOSIS — Z006 Encounter for examination for normal comparison and control in clinical research program: Secondary | ICD-10-CM | POA: Diagnosis not present

## 2016-11-16 DIAGNOSIS — N6082 Other benign mammary dysplasias of left breast: Secondary | ICD-10-CM | POA: Diagnosis not present

## 2016-11-16 DIAGNOSIS — E785 Hyperlipidemia, unspecified: Secondary | ICD-10-CM | POA: Diagnosis not present

## 2016-11-24 DIAGNOSIS — C50912 Malignant neoplasm of unspecified site of left female breast: Secondary | ICD-10-CM | POA: Diagnosis not present

## 2016-11-24 DIAGNOSIS — Z170 Estrogen receptor positive status [ER+]: Secondary | ICD-10-CM | POA: Diagnosis not present

## 2016-11-24 DIAGNOSIS — Z9889 Other specified postprocedural states: Secondary | ICD-10-CM | POA: Diagnosis not present

## 2016-11-24 DIAGNOSIS — I10 Essential (primary) hypertension: Secondary | ICD-10-CM | POA: Diagnosis not present

## 2016-11-24 DIAGNOSIS — Z483 Aftercare following surgery for neoplasm: Secondary | ICD-10-CM | POA: Diagnosis not present

## 2016-11-24 DIAGNOSIS — Z79899 Other long term (current) drug therapy: Secondary | ICD-10-CM | POA: Diagnosis not present

## 2016-11-24 DIAGNOSIS — Z7982 Long term (current) use of aspirin: Secondary | ICD-10-CM | POA: Diagnosis not present

## 2016-11-25 ENCOUNTER — Ambulatory Visit (INDEPENDENT_AMBULATORY_CARE_PROVIDER_SITE_OTHER): Payer: Medicare Other | Admitting: Orthopedic Surgery

## 2016-11-25 DIAGNOSIS — B351 Tinea unguium: Secondary | ICD-10-CM | POA: Diagnosis not present

## 2016-11-25 NOTE — Progress Notes (Signed)
Office Visit Note   Patient: [name]           Date of Birth: 09-Sep-1940           MRN: [id number] Visit Date: 11/25/2016              Requested by: Sharilyn Sites, MD [address] [place], [place] [address] PCP: Purvis Kilts, MD  No chief complaint on file.   HPI: Patient is a 76 year old woman with painful onychomycotic nails 10 she is unable to safely trim the nails on her own. She complains of painful calluses as well. Pain with ambulation pain with shoe wear.  Assessment & Plan: Visit Diagnoses:  1. Onychomycosis     Plan: After informed consent nails were trimmed 10 calluses per 2 patient tolerated this well plan to follow-up in 3 months  Follow-Up Instructions: Return in about 3 months (around 02/25/2017).   Ortho Exam  Patient is alert, oriented, no adenopathy, well-dressed, normal affect, normal respiratory effort. She has an antalgic gait. Patient has absent dorsalis pedis and posterior tibial pulse bilaterally. She has decreased hair growth with thickened discolored onychomycotic nails 10 she has discoloration and pigment changes in both lower extremities she has thin atrophic skin with redness.  Imaging: No results found.  Labs: No results found for: HGBA1C, ESRSEDRATE, CRP, LABURIC, REPTSTATUS, GRAMSTAIN, CULT, LABORGA  Orders:  No orders of the defined types were placed in this encounter.  No orders of the defined types were placed in this encounter.    Procedures: No procedures performed  Clinical Data: No additional findings.  ROS: Review of Systems  All other systems reviewed and are negative.   Objective: Vital Signs: There were no vitals taken for this visit.  Specialty Comments:  No specialty comments available.  PMFS History: Patient Active Problem List   Diagnosis Date Noted  . Onychomycosis 08/26/2016  . OA (osteoarthritis) of knee 12/13/2014   Past Medical History:  Diagnosis Date  . Arthritis   .  Bladder spasms    "spastic bladder" wears pads  . Cancer ([place])    Skin cancer -scalp and eyebrow"basal cell"  . Complication of anesthesia    woke up too early from a surgery  . GERD (gastroesophageal reflux disease)    occ. frequent "hiccoughs" after "eating, indigestion"  . Hyperlipemia   . Hypertension   . Hypothyroidism   . Trigger finger, left    alternate fingers affected. -Occ. shooting pain.  . Wears glasses     No family history on file.  Past Surgical History:  Procedure Laterality Date  . ABDOMINAL HYSTERECTOMY  1989  . APPENDECTOMY    . CARDIAC CATHETERIZATION  2006   normal-done for work up pre hip surgeries  . CARPAL TUNNEL RELEASE Left   . CHOLECYSTECTOMY  1997  . JOINT REPLACEMENT     BTHA  . POLYPECTOMY Bilateral 06/26/2013   Procedure: BILATERAL EXCISION OF NASAL POLYPS ;  Surgeon: Ascencion Dike, MD;  Location: [facility];  Service: ENT;  Laterality: Bilateral;  . SEPTOPLASTY N/A 06/26/2013   Procedure: AND SEPTOPLASTY;  Surgeon: Ascencion Dike, MD;  Location: [facility];  Service: ENT;  Laterality: N/A;  . THYROIDECTOMY  1994  . TONSILLECTOMY    . TOTAL HIP ARTHROPLASTY  2006   right  . TOTAL HIP ARTHROPLASTY  2006   left  . TOTAL KNEE ARTHROPLASTY Right 12/13/2014   Procedure: RIGHT TOTAL KNEE ARTHROPLASTY;  Surgeon: Gaynelle Arabian,  MD;  [facility];  Service: Orthopedics;  Laterality: Right;   Social History   Occupational History  . Not on file.   Social History Main Topics  . Smoking status: Never Smoker  . Smokeless tobacco: Not on file  . Alcohol use No  . Drug use: No  . Sexual activity: Not Currently

## 2016-11-30 DIAGNOSIS — R350 Frequency of micturition: Secondary | ICD-10-CM | POA: Diagnosis not present

## 2016-12-13 DIAGNOSIS — C50912 Malignant neoplasm of unspecified site of left female breast: Secondary | ICD-10-CM | POA: Diagnosis not present

## 2016-12-21 DIAGNOSIS — H3530 Unspecified macular degeneration: Secondary | ICD-10-CM | POA: Diagnosis not present

## 2016-12-21 DIAGNOSIS — H52223 Regular astigmatism, bilateral: Secondary | ICD-10-CM | POA: Diagnosis not present

## 2016-12-21 DIAGNOSIS — H5213 Myopia, bilateral: Secondary | ICD-10-CM | POA: Diagnosis not present

## 2016-12-21 DIAGNOSIS — Z961 Presence of intraocular lens: Secondary | ICD-10-CM | POA: Diagnosis not present

## 2016-12-23 DIAGNOSIS — R350 Frequency of micturition: Secondary | ICD-10-CM | POA: Diagnosis not present

## 2016-12-31 DIAGNOSIS — Z885 Allergy status to narcotic agent status: Secondary | ICD-10-CM | POA: Diagnosis not present

## 2016-12-31 DIAGNOSIS — I10 Essential (primary) hypertension: Secondary | ICD-10-CM | POA: Diagnosis not present

## 2016-12-31 DIAGNOSIS — Z7982 Long term (current) use of aspirin: Secondary | ICD-10-CM | POA: Diagnosis not present

## 2016-12-31 DIAGNOSIS — Z882 Allergy status to sulfonamides status: Secondary | ICD-10-CM | POA: Diagnosis not present

## 2016-12-31 DIAGNOSIS — Z79899 Other long term (current) drug therapy: Secondary | ICD-10-CM | POA: Diagnosis not present

## 2016-12-31 DIAGNOSIS — M899 Disorder of bone, unspecified: Secondary | ICD-10-CM | POA: Diagnosis not present

## 2016-12-31 DIAGNOSIS — Z170 Estrogen receptor positive status [ER+]: Secondary | ICD-10-CM | POA: Diagnosis not present

## 2016-12-31 DIAGNOSIS — Z96643 Presence of artificial hip joint, bilateral: Secondary | ICD-10-CM | POA: Diagnosis not present

## 2016-12-31 DIAGNOSIS — Z780 Asymptomatic menopausal state: Secondary | ICD-10-CM | POA: Diagnosis not present

## 2016-12-31 DIAGNOSIS — Z886 Allergy status to analgesic agent status: Secondary | ICD-10-CM | POA: Diagnosis not present

## 2016-12-31 DIAGNOSIS — Z888 Allergy status to other drugs, medicaments and biological substances status: Secondary | ICD-10-CM | POA: Diagnosis not present

## 2016-12-31 DIAGNOSIS — Z91040 Latex allergy status: Secondary | ICD-10-CM | POA: Diagnosis not present

## 2016-12-31 DIAGNOSIS — M8580 Other specified disorders of bone density and structure, unspecified site: Secondary | ICD-10-CM | POA: Diagnosis not present

## 2016-12-31 DIAGNOSIS — C50912 Malignant neoplasm of unspecified site of left female breast: Secondary | ICD-10-CM | POA: Diagnosis not present

## 2017-01-10 DIAGNOSIS — Z0000 Encounter for general adult medical examination without abnormal findings: Secondary | ICD-10-CM | POA: Diagnosis not present

## 2017-01-10 DIAGNOSIS — Z6836 Body mass index (BMI) 36.0-36.9, adult: Secondary | ICD-10-CM | POA: Diagnosis not present

## 2017-01-10 DIAGNOSIS — Z1389 Encounter for screening for other disorder: Secondary | ICD-10-CM | POA: Diagnosis not present

## 2017-01-13 DIAGNOSIS — R350 Frequency of micturition: Secondary | ICD-10-CM | POA: Diagnosis not present

## 2017-01-24 DIAGNOSIS — Z1211 Encounter for screening for malignant neoplasm of colon: Secondary | ICD-10-CM | POA: Diagnosis not present

## 2017-01-24 DIAGNOSIS — K5730 Diverticulosis of large intestine without perforation or abscess without bleeding: Secondary | ICD-10-CM | POA: Diagnosis not present

## 2017-02-08 DIAGNOSIS — R350 Frequency of micturition: Secondary | ICD-10-CM | POA: Diagnosis not present

## 2017-02-21 ENCOUNTER — Encounter (INDEPENDENT_AMBULATORY_CARE_PROVIDER_SITE_OTHER): Payer: Self-pay | Admitting: Orthopedic Surgery

## 2017-02-21 ENCOUNTER — Ambulatory Visit (INDEPENDENT_AMBULATORY_CARE_PROVIDER_SITE_OTHER): Payer: Medicare Other | Admitting: Orthopedic Surgery

## 2017-02-21 VITALS — Ht 62.0 in | Wt 197.0 lb

## 2017-02-21 DIAGNOSIS — B351 Tinea unguium: Secondary | ICD-10-CM | POA: Diagnosis not present

## 2017-02-21 NOTE — Progress Notes (Signed)
Office Visit Note   Patient: [name]           Date of Birth: [birth year]           MRN: [id number] Visit Date: 02/21/2017              Requested by: Sharilyn Sites, [place], [place] [address] PCP: Sharilyn Sites, MD  Chief Complaint  Patient presents with  . Left Foot - Nail Problem  . Right Foot - Nail Problem      HPI: Patient is a 76 year old woman who presents for follow-up for hypertrophic callus plantar aspect of both feet as well as onychomycotic nails 10.  Assessment & Plan: Visit Diagnoses:  1. Onychomycosis     Plan: Nails trimmed 10 callus pared 2.  Follow-Up Instructions: Return in about 3 months (around 05/24/2017).   Ortho Exam  Patient is alert, oriented, no adenopathy, well-dressed, normal affect, normal respiratory effort. Patient has an antalgic gait. Examination patient does not have protective sensation she has good pulses there is no cellulitis no paronychial infection. She does have some hypertrophic callus after informed consent the calluses. 2 without complications. Patient is thick and discolored onychomycotic nails she is unable to safely trim the nails on her own and the nails were trimmed 10 without complications.  Imaging: No results found.  Labs: No results found for: HGBA1C, ESRSEDRATE, CRP, LABURIC, REPTSTATUS, GRAMSTAIN, CULT, LABORGA  Orders:  No orders of the defined types were placed in this encounter.  No orders of the defined types were placed in this encounter.    Procedures: No procedures performed  Clinical Data: No additional findings.  ROS:  All other systems negative, except as noted in the HPI. Review of Systems  Objective: Vital Signs: Ht 5\' 2"  (1.575 m)   Wt 197 lb (89.4 kg)   BMI 36.03 kg/m   Specialty Comments:  No specialty comments available.  PMFS History: Patient Active Problem List   Diagnosis Date Noted  . Onychomycosis 08/26/2016  . OA (osteoarthritis) of knee  12/13/2014   Past Medical History:  Diagnosis Date  . Arthritis   . Bladder spasms    "spastic bladder" wears pads  . Cancer ([facility] [facility])    Skin cancer -scalp and eyebrow"basal cell"  . Complication of anesthesia    woke up too early from a surgery  . GERD (gastroesophageal reflux disease)    occ. frequent "hiccoughs" after "eating, indigestion"  . Hyperlipemia   . Hypertension   . Hypothyroidism   . Trigger finger, left    alternate fingers affected. -Occ. shooting pain.  . Wears glasses     No family history on file.  Past Surgical History:  Procedure Laterality Date  . ABDOMINAL HYSTERECTOMY  1989  . APPENDECTOMY    . CARDIAC CATHETERIZATION  2006   normal-done for work up pre hip surgeries  . CARPAL TUNNEL RELEASE Left   . CHOLECYSTECTOMY  1997  . JOINT REPLACEMENT     BTHA  . POLYPECTOMY Bilateral 06/26/2013   Procedure: BILATERAL EXCISION OF NASAL POLYPS ;  Surgeon: Ascencion Dike, MD;  Location: [facility];  Service: ENT;  Laterality: Bilateral;  . SEPTOPLASTY N/A 06/26/2013   Procedure: AND SEPTOPLASTY;  Surgeon: Ascencion Dike, MD;  Location: [facility];  Service: ENT;  Laterality: N/A;  . THYROIDECTOMY  1994  . TONSILLECTOMY    . TOTAL HIP ARTHROPLASTY  2006   right  . TOTAL HIP ARTHROPLASTY  2006   left  . TOTAL KNEE ARTHROPLASTY Right 12/13/2014   Procedure: RIGHT TOTAL KNEE ARTHROPLASTY;  Surgeon: Gaynelle Arabian, MD;  [facility];  Service: Orthopedics;  Laterality: Right;   Social History   Occupational History  . Not on file.   Social History Main Topics  . Smoking status: Never Smoker  . Smokeless tobacco: Never Used  . Alcohol use No  . Drug use: No  . Sexual activity: Not Currently

## 2017-03-08 DIAGNOSIS — R350 Frequency of micturition: Secondary | ICD-10-CM | POA: Diagnosis not present

## 2017-03-29 DIAGNOSIS — R350 Frequency of micturition: Secondary | ICD-10-CM | POA: Diagnosis not present

## 2017-04-01 DIAGNOSIS — Z79811 Long term (current) use of aromatase inhibitors: Secondary | ICD-10-CM | POA: Diagnosis not present

## 2017-04-01 DIAGNOSIS — C50912 Malignant neoplasm of unspecified site of left female breast: Secondary | ICD-10-CM | POA: Diagnosis not present

## 2017-04-26 DIAGNOSIS — R350 Frequency of micturition: Secondary | ICD-10-CM | POA: Diagnosis not present

## 2017-05-16 DIAGNOSIS — E559 Vitamin D deficiency, unspecified: Secondary | ICD-10-CM | POA: Diagnosis not present

## 2017-05-16 DIAGNOSIS — Z6836 Body mass index (BMI) 36.0-36.9, adult: Secondary | ICD-10-CM | POA: Diagnosis not present

## 2017-05-16 DIAGNOSIS — I10 Essential (primary) hypertension: Secondary | ICD-10-CM | POA: Diagnosis not present

## 2017-05-16 DIAGNOSIS — E782 Mixed hyperlipidemia: Secondary | ICD-10-CM | POA: Diagnosis not present

## 2017-05-16 DIAGNOSIS — E6609 Other obesity due to excess calories: Secondary | ICD-10-CM | POA: Diagnosis not present

## 2017-05-16 DIAGNOSIS — D0592 Unspecified type of carcinoma in situ of left breast: Secondary | ICD-10-CM | POA: Diagnosis not present

## 2017-05-17 DIAGNOSIS — E559 Vitamin D deficiency, unspecified: Secondary | ICD-10-CM | POA: Diagnosis not present

## 2017-05-17 DIAGNOSIS — I10 Essential (primary) hypertension: Secondary | ICD-10-CM | POA: Diagnosis not present

## 2017-05-17 DIAGNOSIS — E782 Mixed hyperlipidemia: Secondary | ICD-10-CM | POA: Diagnosis not present

## 2017-05-17 DIAGNOSIS — D0592 Unspecified type of carcinoma in situ of left breast: Secondary | ICD-10-CM | POA: Diagnosis not present

## 2017-05-19 DIAGNOSIS — R350 Frequency of micturition: Secondary | ICD-10-CM | POA: Diagnosis not present

## 2017-05-23 ENCOUNTER — Encounter (INDEPENDENT_AMBULATORY_CARE_PROVIDER_SITE_OTHER): Payer: Self-pay | Admitting: Orthopedic Surgery

## 2017-05-23 ENCOUNTER — Ambulatory Visit (INDEPENDENT_AMBULATORY_CARE_PROVIDER_SITE_OTHER): Payer: Medicare Other | Admitting: Orthopedic Surgery

## 2017-05-23 DIAGNOSIS — B351 Tinea unguium: Secondary | ICD-10-CM | POA: Diagnosis not present

## 2017-05-23 DIAGNOSIS — M25571 Pain in right ankle and joints of right foot: Secondary | ICD-10-CM | POA: Diagnosis not present

## 2017-05-23 DIAGNOSIS — M25572 Pain in left ankle and joints of left foot: Secondary | ICD-10-CM | POA: Diagnosis not present

## 2017-05-23 NOTE — Progress Notes (Signed)
Office Visit Note   Patient: [name]           Date of Birth: 06-02-1941           MRN: [id number] Visit Date: 05/23/2017              Requested by: Sharilyn Sites, [place], [place] [address] PCP: Sharilyn Sites, MD  Chief Complaint  Patient presents with  . Left Foot - Follow-up  . Right Foot - Follow-up      HPI: Patient is a 76 year old woman who presents complaining of painful nails bilaterally. Patient has previously had corns beneath the metatarsal heads as well.  Assessment & Plan: Visit Diagnoses:  1. Onychomycosis     Plan: Continue with current shoe wear no significant corn development at this time  Follow-Up Instructions: Return in about 3 months (around 08/22/2017).   Ortho Exam  Patient is alert, oriented, no adenopathy, well-dressed, normal affect, normal respiratory effort. Examination patient has good pulses. She has no plantar ulcers no calluses no signs of infection. She has thick and discolored onychomycotic nails 10. She complains of pain around the right great toe. There is no cellulitis no drainage no signs of paronychial infection. After informed consent the nails are trimmed 10 without complications. Patient is unable to safely trim the nails on her own.  Imaging: No results found. No images are attached to the encounter.  Labs: No results found for: HGBA1C, ESRSEDRATE, CRP, LABURIC, REPTSTATUS, GRAMSTAIN, CULT, LABORGA  Orders:  No orders of the defined types were placed in this encounter.  No orders of the defined types were placed in this encounter.    Procedures: No procedures performed  Clinical Data: No additional findings.  ROS:  All other systems negative, except as noted in the HPI. Review of Systems  Objective: Vital Signs: There were no vitals taken for this visit.  Specialty Comments:  No specialty comments available.  PMFS History: Patient Active Problem List   Diagnosis Date Noted    . Onychomycosis 08/26/2016  . OA (osteoarthritis) of knee 12/13/2014   Past Medical History:  Diagnosis Date  . Arthritis   . Bladder spasms    "spastic bladder" wears pads  . Cancer ([facility])    Skin cancer -scalp and eyebrow"basal cell"  . Complication of anesthesia    woke up too early from a surgery  . GERD (gastroesophageal reflux disease)    occ. frequent "hiccoughs" after "eating, indigestion"  . Hyperlipemia   . Hypertension   . Hypothyroidism   . Trigger finger, left    alternate fingers affected. -Occ. shooting pain.  . Wears glasses     History reviewed. No pertinent family history.  Past Surgical History:  Procedure Laterality Date  . ABDOMINAL HYSTERECTOMY  1989  . APPENDECTOMY    . CARDIAC CATHETERIZATION  2006   normal-done for work up pre hip surgeries  . CARPAL TUNNEL RELEASE Left   . CHOLECYSTECTOMY  1997  . JOINT REPLACEMENT     BTHA  . POLYPECTOMY Bilateral 06/26/2013   Procedure: BILATERAL EXCISION OF NASAL POLYPS ;  Surgeon: Ascencion Dike, MD;  Location: [facility];  Service: ENT;  Laterality: Bilateral;  . SEPTOPLASTY N/A 06/26/2013   Procedure: AND SEPTOPLASTY;  Surgeon: Ascencion Dike, MD;  Location: [facility];  Service: ENT;  Laterality: N/A;  . THYROIDECTOMY  1994  . TONSILLECTOMY    . TOTAL HIP ARTHROPLASTY  2006   right  .  TOTAL HIP ARTHROPLASTY  2006   left  . TOTAL KNEE ARTHROPLASTY Right 12/13/2014   Procedure: RIGHT TOTAL KNEE ARTHROPLASTY;  Surgeon: Gaynelle Arabian, MD;  [facility];  Service: Orthopedics;  Laterality: Right;   Social History   Occupational History  . Not on file.   Social History Main Topics  . Smoking status: Never Smoker  . Smokeless tobacco: Never Used  . Alcohol use No  . Drug use: No  . Sexual activity: Not Currently

## 2017-06-03 DIAGNOSIS — Z170 Estrogen receptor positive status [ER+]: Secondary | ICD-10-CM | POA: Diagnosis not present

## 2017-06-03 DIAGNOSIS — C50812 Malignant neoplasm of overlapping sites of left female breast: Secondary | ICD-10-CM | POA: Diagnosis not present

## 2017-06-03 DIAGNOSIS — Z006 Encounter for examination for normal comparison and control in clinical research program: Secondary | ICD-10-CM | POA: Diagnosis not present

## 2017-06-03 DIAGNOSIS — C50912 Malignant neoplasm of unspecified site of left female breast: Secondary | ICD-10-CM | POA: Diagnosis not present

## 2017-06-07 DIAGNOSIS — R350 Frequency of micturition: Secondary | ICD-10-CM | POA: Diagnosis not present

## 2017-06-30 DIAGNOSIS — R350 Frequency of micturition: Secondary | ICD-10-CM | POA: Diagnosis not present

## 2017-07-05 DIAGNOSIS — Z23 Encounter for immunization: Secondary | ICD-10-CM | POA: Diagnosis not present

## 2017-07-22 DIAGNOSIS — B372 Candidiasis of skin and nail: Secondary | ICD-10-CM | POA: Diagnosis not present

## 2017-07-22 DIAGNOSIS — M8580 Other specified disorders of bone density and structure, unspecified site: Secondary | ICD-10-CM | POA: Diagnosis not present

## 2017-07-22 DIAGNOSIS — C50912 Malignant neoplasm of unspecified site of left female breast: Secondary | ICD-10-CM | POA: Diagnosis not present

## 2017-08-09 DIAGNOSIS — R350 Frequency of micturition: Secondary | ICD-10-CM | POA: Diagnosis not present

## 2017-08-22 ENCOUNTER — Encounter (INDEPENDENT_AMBULATORY_CARE_PROVIDER_SITE_OTHER): Payer: Self-pay | Admitting: Orthopedic Surgery

## 2017-08-22 ENCOUNTER — Ambulatory Visit (INDEPENDENT_AMBULATORY_CARE_PROVIDER_SITE_OTHER): Payer: Medicare Other | Admitting: Orthopedic Surgery

## 2017-08-22 VITALS — Ht 62.0 in | Wt 197.0 lb

## 2017-08-22 DIAGNOSIS — B351 Tinea unguium: Secondary | ICD-10-CM | POA: Diagnosis not present

## 2017-08-22 DIAGNOSIS — M25572 Pain in left ankle and joints of left foot: Secondary | ICD-10-CM | POA: Diagnosis not present

## 2017-08-22 DIAGNOSIS — M25571 Pain in right ankle and joints of right foot: Secondary | ICD-10-CM

## 2017-08-22 NOTE — Progress Notes (Signed)
Office Visit Note   Patient: [name]           Date of Birth: 31-Mar-1941           MRN: [id number] Visit Date: 08/22/2017              Requested by: Sharilyn Sites, [place], [place] [address] PCP: Sharilyn Sites, MD  Chief Complaint  Patient presents with  . Left Foot - Nail Problem  . Right Foot - Nail Problem      HPI: Patient is a 76 year old woman who presents in follow-up for both feet she has had callus on the plantar aspect of both feet in the past and she has thickened discolored onychomycotic nails which she is unable to safely trim on her own.  Patient denies any new medical problems or change in her medical condition.  Assessment & Plan: Visit Diagnoses:  1. Onychomycosis     Plan: Nails were trimmed K02 without complications continue with protective shoe wear.  Follow-Up Instructions: Return in about 3 months (around 11/20/2017).   Ortho Exam  Patient is alert, oriented, no adenopathy, well-dressed, normal affect, normal respiratory effort. Examination patient has an antalgic gait.  She has thickened discolored onychomycotic nails x10 she is unable to safely trim the nails on her own and the nails were trimmed R42 without complications.  Patient has no plantar ulcers or calluses.  There is no redness no cellulitis there is no paronychial infection.  Imaging: No results found. No images are attached to the encounter.  Labs: No results found for: HGBA1C, ESRSEDRATE, CRP, LABURIC, REPTSTATUS, GRAMSTAIN, CULT, LABORGA  @LABSALLVALUES (HGBA1)@  Body mass index is 36.03 kg/m.  Orders:  No orders of the defined types were placed in this encounter.  No orders of the defined types were placed in this encounter.    Procedures: No procedures performed  Clinical Data: No additional findings.  ROS:  All other systems negative, except as noted in the HPI. Review of Systems  Objective: Vital Signs: Ht 5\' 2"  (1.575 m)   Wt 197 lb  (89.4 kg)   BMI 36.03 kg/m   Specialty Comments:  No specialty comments available.  PMFS History: Patient Active Problem List   Diagnosis Date Noted  . Onychomycosis 08/26/2016  . OA (osteoarthritis) of knee 12/13/2014   Past Medical History:  Diagnosis Date  . Arthritis   . Bladder spasms    "spastic bladder" wears pads  . Cancer ([place])    Skin cancer -scalp and eyebrow"basal cell"  . Complication of anesthesia    woke up too early from a surgery  . GERD (gastroesophageal reflux disease)    occ. frequent "hiccoughs" after "eating, indigestion"  . Hyperlipemia   . Hypertension   . Hypothyroidism   . Trigger finger, left    alternate fingers affected. -Occ. shooting pain.  . Wears glasses     History reviewed. No pertinent family history.  Past Surgical History:  Procedure Laterality Date  . ABDOMINAL HYSTERECTOMY  1989  . APPENDECTOMY    . CARDIAC CATHETERIZATION  2006   normal-done for work up pre hip surgeries  . CARPAL TUNNEL RELEASE Left   . CHOLECYSTECTOMY  1997  . JOINT REPLACEMENT     BTHA  . POLYPECTOMY Bilateral 06/26/2013   Procedure: BILATERAL EXCISION OF NASAL POLYPS ;  Surgeon: Ascencion Dike, MD;  Location: [facility];  Service: ENT;  Laterality: Bilateral;  . SEPTOPLASTY N/A 06/26/2013   Procedure:  AND SEPTOPLASTY;  Surgeon: Ascencion Dike, MD;  Location: [facility];  Service: ENT;  Laterality: N/A;  . THYROIDECTOMY  1994  . TONSILLECTOMY    . TOTAL HIP ARTHROPLASTY  2006   right  . TOTAL HIP ARTHROPLASTY  2006   left  . TOTAL KNEE ARTHROPLASTY Right 12/13/2014   Procedure: RIGHT TOTAL KNEE ARTHROPLASTY;  Surgeon: Gaynelle Arabian, MD;  [facility];  Service: Orthopedics;  Laterality: Right;   Social History   Occupational History  . Not on file  Tobacco Use  . Smoking status: Never Smoker  . Smokeless tobacco: Never Used  Substance and Sexual Activity  . Alcohol use: No  . Drug use: No  . Sexual activity: Not  Currently

## 2017-09-08 DIAGNOSIS — R350 Frequency of micturition: Secondary | ICD-10-CM | POA: Diagnosis not present

## 2017-09-27 DIAGNOSIS — R350 Frequency of micturition: Secondary | ICD-10-CM | POA: Diagnosis not present

## 2017-09-29 DIAGNOSIS — Z85828 Personal history of other malignant neoplasm of skin: Secondary | ICD-10-CM | POA: Diagnosis not present

## 2017-09-29 DIAGNOSIS — Z08 Encounter for follow-up examination after completed treatment for malignant neoplasm: Secondary | ICD-10-CM | POA: Diagnosis not present

## 2017-09-29 DIAGNOSIS — L820 Inflamed seborrheic keratosis: Secondary | ICD-10-CM | POA: Diagnosis not present

## 2017-09-29 DIAGNOSIS — C4441 Basal cell carcinoma of skin of scalp and neck: Secondary | ICD-10-CM | POA: Diagnosis not present

## 2017-10-20 DIAGNOSIS — R350 Frequency of micturition: Secondary | ICD-10-CM | POA: Diagnosis not present

## 2017-10-21 DIAGNOSIS — Z79811 Long term (current) use of aromatase inhibitors: Secondary | ICD-10-CM | POA: Diagnosis not present

## 2017-10-21 DIAGNOSIS — C50912 Malignant neoplasm of unspecified site of left female breast: Secondary | ICD-10-CM | POA: Diagnosis not present

## 2017-10-27 DIAGNOSIS — Z08 Encounter for follow-up examination after completed treatment for malignant neoplasm: Secondary | ICD-10-CM | POA: Diagnosis not present

## 2017-10-27 DIAGNOSIS — Z85828 Personal history of other malignant neoplasm of skin: Secondary | ICD-10-CM | POA: Diagnosis not present

## 2017-11-24 ENCOUNTER — Ambulatory Visit (INDEPENDENT_AMBULATORY_CARE_PROVIDER_SITE_OTHER): Payer: Medicare Other | Admitting: Orthopedic Surgery

## 2017-12-09 DIAGNOSIS — Z853 Personal history of malignant neoplasm of breast: Secondary | ICD-10-CM | POA: Diagnosis not present

## 2017-12-09 DIAGNOSIS — C50912 Malignant neoplasm of unspecified site of left female breast: Secondary | ICD-10-CM | POA: Diagnosis not present

## 2017-12-09 DIAGNOSIS — Z08 Encounter for follow-up examination after completed treatment for malignant neoplasm: Secondary | ICD-10-CM | POA: Diagnosis not present

## 2017-12-09 DIAGNOSIS — Z170 Estrogen receptor positive status [ER+]: Secondary | ICD-10-CM | POA: Diagnosis not present

## 2017-12-20 DIAGNOSIS — E6609 Other obesity due to excess calories: Secondary | ICD-10-CM | POA: Diagnosis not present

## 2017-12-20 DIAGNOSIS — Z1389 Encounter for screening for other disorder: Secondary | ICD-10-CM | POA: Diagnosis not present

## 2017-12-20 DIAGNOSIS — Z6836 Body mass index (BMI) 36.0-36.9, adult: Secondary | ICD-10-CM | POA: Diagnosis not present

## 2017-12-20 DIAGNOSIS — J22 Unspecified acute lower respiratory infection: Secondary | ICD-10-CM | POA: Diagnosis not present

## 2017-12-29 DIAGNOSIS — H353121 Nonexudative age-related macular degeneration, left eye, early dry stage: Secondary | ICD-10-CM | POA: Diagnosis not present

## 2017-12-29 DIAGNOSIS — H5211 Myopia, right eye: Secondary | ICD-10-CM | POA: Diagnosis not present

## 2017-12-29 DIAGNOSIS — H26493 Other secondary cataract, bilateral: Secondary | ICD-10-CM | POA: Diagnosis not present

## 2017-12-29 DIAGNOSIS — H353111 Nonexudative age-related macular degeneration, right eye, early dry stage: Secondary | ICD-10-CM | POA: Diagnosis not present

## 2017-12-29 DIAGNOSIS — H52223 Regular astigmatism, bilateral: Secondary | ICD-10-CM | POA: Diagnosis not present

## 2017-12-29 DIAGNOSIS — Z961 Presence of intraocular lens: Secondary | ICD-10-CM | POA: Diagnosis not present

## 2018-01-25 DIAGNOSIS — E6609 Other obesity due to excess calories: Secondary | ICD-10-CM | POA: Diagnosis not present

## 2018-01-25 DIAGNOSIS — Z6836 Body mass index (BMI) 36.0-36.9, adult: Secondary | ICD-10-CM | POA: Diagnosis not present

## 2018-01-25 DIAGNOSIS — E039 Hypothyroidism, unspecified: Secondary | ICD-10-CM | POA: Diagnosis not present

## 2018-01-25 DIAGNOSIS — Z0000 Encounter for general adult medical examination without abnormal findings: Secondary | ICD-10-CM | POA: Diagnosis not present

## 2018-01-25 DIAGNOSIS — R600 Localized edema: Secondary | ICD-10-CM | POA: Diagnosis not present

## 2018-01-25 DIAGNOSIS — Z1389 Encounter for screening for other disorder: Secondary | ICD-10-CM | POA: Diagnosis not present

## 2018-02-01 DIAGNOSIS — E6609 Other obesity due to excess calories: Secondary | ICD-10-CM | POA: Diagnosis not present

## 2018-02-01 DIAGNOSIS — I509 Heart failure, unspecified: Secondary | ICD-10-CM | POA: Diagnosis not present

## 2018-02-01 DIAGNOSIS — Z6836 Body mass index (BMI) 36.0-36.9, adult: Secondary | ICD-10-CM | POA: Diagnosis not present

## 2018-02-14 ENCOUNTER — Ambulatory Visit (INDEPENDENT_AMBULATORY_CARE_PROVIDER_SITE_OTHER): Payer: Medicare Other | Admitting: Cardiovascular Disease

## 2018-02-14 ENCOUNTER — Encounter: Payer: Self-pay | Admitting: Cardiovascular Disease

## 2018-02-14 VITALS — BP 124/72 | HR 65 | Ht 62.0 in | Wt 205.0 lb

## 2018-02-14 DIAGNOSIS — R600 Localized edema: Secondary | ICD-10-CM | POA: Diagnosis not present

## 2018-02-14 DIAGNOSIS — R601 Generalized edema: Secondary | ICD-10-CM | POA: Diagnosis not present

## 2018-02-14 DIAGNOSIS — E785 Hyperlipidemia, unspecified: Secondary | ICD-10-CM | POA: Insufficient documentation

## 2018-02-14 DIAGNOSIS — Z8249 Family history of ischemic heart disease and other diseases of the circulatory system: Secondary | ICD-10-CM | POA: Diagnosis not present

## 2018-02-14 DIAGNOSIS — E7800 Pure hypercholesterolemia, unspecified: Secondary | ICD-10-CM

## 2018-02-14 DIAGNOSIS — I10 Essential (primary) hypertension: Secondary | ICD-10-CM | POA: Diagnosis not present

## 2018-02-14 NOTE — Assessment & Plan Note (Signed)
History of hyperlipidemia on statin therapy. 

## 2018-02-14 NOTE — Patient Instructions (Signed)
Medication Instructions:   NO CHANGE  Testing/Procedures:  Your physician has requested that you have an echocardiogram. Echocardiography is a painless test that uses sound waves to create images of your heart. It provides your doctor with information about the size and shape of your heart and how well your heart's chambers and valves are working. This procedure takes approximately one hour. There are no restrictions for this procedure.    Follow-Up:  Your physician wants you to follow-up in: [place] will receive a reminder letter in the mail two months in advance. If you don't receive a letter, please call our office to schedule the follow-up appointment.  Your physician wants you to follow-up in: [place] will receive a reminder letter in the mail two months in advance. If you don't receive a letter, please call our office to schedule the follow-up appointment.

## 2018-02-14 NOTE — Assessment & Plan Note (Signed)
History of essential hypertension her blood pressure measured today 124/72 .  Continue current meds at current dosing.Marland Kitchen

## 2018-02-14 NOTE — Progress Notes (Signed)
02/14/2018 NIJAE Mccormick   12-29-1940  [id number]  Primary Physician Sharilyn Sites, MD Primary Cardiologist: Lorretta Harp MD Garret Reddish, [place], [place]  HPI:  [name] is a 77 y.o. mildly overweight single Caucasian female with no children referred by Collene Mares, PA-C to evaluate bilateral lower extremity edema.  She does have a history of treated hypertension and hyperlipidemia.  She is never had a heart attack or stroke.  Her mother does have CAD and she is my patient.  She denies chest pain or shortness of breath.  She is had lower extremity edema for the last several weeks with a recent BMP drawn by her PCP of 144.  She was begun on as needed Lasix which has improved her swelling somewhat.   Current Meds  Medication Sig  . anastrozole (ARIMIDEX) 1 MG tablet Take 1 mg by mouth daily.  . Calcium Citrate (CITRACAL PO) Take 2 tablets by mouth daily.  . furosemide (LASIX) 20 MG tablet Take 20-40 mg by mouth as needed.  Marland Kitchen levothyroxine (SYNTHROID, LEVOTHROID) 125 MCG tablet Take 125 mcg by mouth daily before breakfast.  . metoprolol (LOPRESSOR) 50 MG tablet Take 50 mg by mouth 2 (two) times daily.  . Multiple Vitamins-Minerals (PRESERVISION AREDS 2 PO) Take 1 tablet by mouth 2 (two) times daily.  . Omega-3 Fatty Acids (FISH OIL) 1000 MG CAPS Take 1,000 mg by mouth 2 (two) times daily.  Marland Kitchen oxybutynin (DITROPAN) 5 MG tablet Take 5 mg by mouth 2 (two) times daily.  . simvastatin (ZOCOR) 10 MG tablet Take 10 mg by mouth daily.     Allergies  Allergen Reactions  . Feldene [Piroxicam] Other (See Comments)    CANT REMEMBER  . Ivp Dye [Iodinated Diagnostic Agents] Other (See Comments)    CANT REMEMBER  . Naproxen Nausea And Vomiting  . Sulfa Antibiotics     CANT REMEMBER  . Vicodin [Hydrocodone-Acetaminophen] Nausea And Vomiting    Social History   Socioeconomic History  . Marital status: Single    Spouse name: Not on file  . Number of children: Not on file  . Years of  education: Not on file  . Highest education level: Not on file  Occupational History  . Not on file  Social Needs  . Financial resource strain: Not on file  . Food insecurity:    Worry: Not on file    Inability: Not on file  . Transportation needs:    Medical: Not on file    Non-medical: Not on file  Tobacco Use  . Smoking status: Never Smoker  . Smokeless tobacco: Never Used  Substance and Sexual Activity  . Alcohol use: No  . Drug use: No  . Sexual activity: Not Currently  Lifestyle  . Physical activity:    Days per week: Not on file    Minutes per session: Not on file  . Stress: Not on file  Relationships  . Social connections:    Talks on phone: Not on file    Gets together: Not on file    Attends religious service: Not on file    Active member of club or organization: Not on file    Attends meetings of clubs or organizations: Not on file    Relationship status: Not on file  . Intimate partner violence:    Fear of current or ex partner: Not on file    Emotionally abused: Not on file    Physically abused: Not on file  Forced sexual activity: Not on file  Other Topics Concern  . Not on file  Social History Narrative  . Not on file     Review of Systems: General: negative for chills, fever, night sweats or weight changes.  Cardiovascular: negative for chest pain, dyspnea on exertion, edema, orthopnea, palpitations, paroxysmal nocturnal dyspnea or shortness of breath Dermatological: negative for rash Respiratory: negative for cough or wheezing Urologic: negative for hematuria Abdominal: negative for nausea, vomiting, diarrhea, bright red blood per rectum, melena, or hematemesis Neurologic: negative for visual changes, syncope, or dizziness All other systems reviewed and are otherwise negative except as noted above.    Blood pressure 124/72, pulse 65, height 5\' 2"  (1.575 m), weight 205 lb (93 kg).  General appearance: alert and no distress Neck: no  adenopathy, no carotid bruit, no JVD, supple, symmetrical, trachea midline and thyroid not enlarged, symmetric, no tenderness/mass/nodules Lungs: clear to auscultation bilaterally Heart: regular rate and rhythm, S1, S2 normal, no murmur, click, rub or gallop Extremities: Trace bilateral lower extremity edema Pulses: 2+ and symmetric Skin: Skin color, texture, turgor normal. No rashes or lesions Neurologic: Alert and oriented X 3, normal strength and tone. Normal symmetric reflexes. Normal coordination and gait  EKG sinus rhythm at 65 with LVH voltage.  I personally reviewed this EKG.  ASSESSMENT AND PLAN:   Hyperlipidemia History of hyperlipidemia on statin therapy.  Essential hypertension History of essential hypertension her blood pressure measured today 124/72 .  Continue current meds at current dosing..  Bilateral lower extremity edema History of bilateral lower extremity edema which is fairly recent in onset of last several weeks.  She saw her PCP who ordered a BNP which was mildly elevated at 144.  He placed her on as needed Lasix which has helped somewhat.  She denies chest pain or shortness of breath.  I am going to get a 2D echo to evaluate LV function.      Lorretta Harp MD FACP,FACC,FAHA, [facility] 02/14/2018 3:10 PM

## 2018-02-14 NOTE — Assessment & Plan Note (Signed)
History of bilateral lower extremity edema which is fairly recent in onset of last several weeks.  She saw her PCP who ordered a BNP which was mildly elevated at 144.  He placed her on as needed Lasix which has helped somewhat.  She denies chest pain or shortness of breath.  I am going to get a 2D echo to evaluate LV function.

## 2018-02-20 DIAGNOSIS — Z79811 Long term (current) use of aromatase inhibitors: Secondary | ICD-10-CM | POA: Diagnosis not present

## 2018-02-20 DIAGNOSIS — C50912 Malignant neoplasm of unspecified site of left female breast: Secondary | ICD-10-CM | POA: Diagnosis not present

## 2018-02-20 DIAGNOSIS — M85832 Other specified disorders of bone density and structure, left forearm: Secondary | ICD-10-CM | POA: Diagnosis not present

## 2018-03-01 ENCOUNTER — Other Ambulatory Visit (HOSPITAL_COMMUNITY): Payer: 59

## 2018-03-03 ENCOUNTER — Ambulatory Visit (HOSPITAL_COMMUNITY): Payer: Medicare Other | Attending: Internal Medicine

## 2018-03-03 ENCOUNTER — Other Ambulatory Visit: Payer: Self-pay

## 2018-03-03 DIAGNOSIS — R601 Generalized edema: Secondary | ICD-10-CM | POA: Diagnosis not present

## 2018-03-03 DIAGNOSIS — Z8249 Family history of ischemic heart disease and other diseases of the circulatory system: Secondary | ICD-10-CM | POA: Insufficient documentation

## 2018-03-03 DIAGNOSIS — I119 Hypertensive heart disease without heart failure: Secondary | ICD-10-CM | POA: Diagnosis not present

## 2018-03-03 DIAGNOSIS — E785 Hyperlipidemia, unspecified: Secondary | ICD-10-CM | POA: Insufficient documentation

## 2018-03-30 DIAGNOSIS — Z08 Encounter for follow-up examination after completed treatment for malignant neoplasm: Secondary | ICD-10-CM | POA: Diagnosis not present

## 2018-03-30 DIAGNOSIS — Z85828 Personal history of other malignant neoplasm of skin: Secondary | ICD-10-CM | POA: Diagnosis not present

## 2018-03-30 DIAGNOSIS — L304 Erythema intertrigo: Secondary | ICD-10-CM | POA: Diagnosis not present

## 2018-03-31 DIAGNOSIS — R350 Frequency of micturition: Secondary | ICD-10-CM | POA: Diagnosis not present

## 2018-03-31 DIAGNOSIS — N3946 Mixed incontinence: Secondary | ICD-10-CM | POA: Diagnosis not present

## 2018-04-06 DIAGNOSIS — M65332 Trigger finger, left middle finger: Secondary | ICD-10-CM | POA: Diagnosis not present

## 2018-04-21 DIAGNOSIS — M25552 Pain in left hip: Secondary | ICD-10-CM | POA: Diagnosis not present

## 2018-04-21 DIAGNOSIS — M545 Low back pain: Secondary | ICD-10-CM | POA: Diagnosis not present

## 2018-04-21 DIAGNOSIS — M25551 Pain in right hip: Secondary | ICD-10-CM | POA: Diagnosis not present

## 2018-04-21 DIAGNOSIS — M25561 Pain in right knee: Secondary | ICD-10-CM | POA: Diagnosis not present

## 2018-05-03 ENCOUNTER — Encounter (HOSPITAL_COMMUNITY): Payer: Self-pay

## 2018-05-03 ENCOUNTER — Other Ambulatory Visit: Payer: Self-pay

## 2018-05-03 ENCOUNTER — Ambulatory Visit (HOSPITAL_COMMUNITY): Payer: Medicare Other | Attending: Student

## 2018-05-03 DIAGNOSIS — M6281 Muscle weakness (generalized): Secondary | ICD-10-CM | POA: Insufficient documentation

## 2018-05-03 DIAGNOSIS — M62830 Muscle spasm of back: Secondary | ICD-10-CM | POA: Diagnosis not present

## 2018-05-03 DIAGNOSIS — M545 Low back pain, unspecified: Secondary | ICD-10-CM

## 2018-05-03 NOTE — Patient Instructions (Signed)
Access Code: [email or web address]  URL: [email or web address]  Date: 05/03/2018  Prepared by: Geraldine Solar   Exercises Supine Lower Trunk Rotation - 10 reps - 3 sets - 1x daily - 7x weekly Seated Flexion Stretch to R and L- 10 reps - 3 sets - 1x daily - 7x weekly

## 2018-05-03 NOTE — Therapy (Signed)
[place], [place], [address] Phone: [phone]   Fax:  [phone]  Physical Therapy Evaluation  Patient Details  Name: [name] MRN: [id number] Date of Birth: 1941-05-19 Referring Provider: Derl Barrow, PA-C   Encounter Date: 05/03/2018  PT End of Session - 05/03/18 1440    Visit Number  1    Number of Visits  7    Date for PT Re-Evaluation  05/24/18    Authorization Type  Generic [employer] (Medicare)    Authorization Time Period  05/03/18 to 05/24/18    Authorization - Visit Number  1    Authorization - Number of Visits  10    PT Start Time  1300    PT Stop Time  1340    PT Time Calculation (min)  40 min    Activity Tolerance  Patient tolerated treatment well    Behavior During Therapy  [facility] for tasks assessed/performed       Past Medical History:  Diagnosis Date  . Arthritis   . Bladder spasms    "spastic bladder" wears pads  . Cancer ([place])    Skin cancer -scalp and eyebrow"basal cell"  . Complication of anesthesia    woke up too early from a surgery  . GERD (gastroesophageal reflux disease)    occ. frequent "hiccoughs" after "eating, indigestion"  . Hyperlipemia   . Hypertension   . Hypothyroidism   . Trigger finger, left    alternate fingers affected. -Occ. shooting pain.  . Wears glasses     Past Surgical History:  Procedure Laterality Date  . ABDOMINAL HYSTERECTOMY  1989  . APPENDECTOMY    . CARDIAC CATHETERIZATION  2006   normal-done for work up pre hip surgeries  . CARPAL TUNNEL RELEASE Left   . CHOLECYSTECTOMY  1997  . JOINT REPLACEMENT     BTHA  . POLYPECTOMY Bilateral 06/26/2013   Procedure: BILATERAL EXCISION OF NASAL POLYPS ;  Surgeon: Ascencion Dike, MD;  Location: [place];  Service: ENT;  Laterality: Bilateral;  . SEPTOPLASTY N/A 06/26/2013   Procedure: AND SEPTOPLASTY;  Surgeon: Ascencion Dike, MD;  Location: [place];  Service: ENT;   Laterality: N/A;  . THYROIDECTOMY  1994  . TONSILLECTOMY    . TOTAL HIP ARTHROPLASTY  2006   right  . TOTAL HIP ARTHROPLASTY  2006   left  . TOTAL KNEE ARTHROPLASTY Right 12/13/2014   Procedure: RIGHT TOTAL KNEE ARTHROPLASTY;  Surgeon: Gaynelle Arabian, MD;  Location: [facility];  Service: Orthopedics;  Laterality: Right;    There were no vitals filed for this visit.   Subjective Assessment - 05/03/18 1309    Subjective  Pt states that she has been having awful pain in her R lower back for about 6-8 weeks. She sees Dr. Grandville Silos the chiropractor 1x/week and he has tried to treat her pain but with limited success. She did have recent x-rays which didn't show any changes to her R THA. The PA-C that she saw told her it is likely a pulled muscle. She states that when she reaches to the L and when he leans forward. She denies any radicular pain or b/b issues since this pain started. She is not sure how this pain started and this is the first time she has dealt with this pain, but does have a mild history of LBP.    Limitations  Lifting;House hold activities    How long can  you sit comfortably?  no issues    How long can you stand comfortably?  no issues    How long can you walk comfortably?  no issues    Diagnostic tests  x-rays at appointment with referring PA-C    Patient Stated Goals  for pain to go away    Currently in Pain?  No/denies         [facility] PT Assessment - 05/03/18 0001      Assessment   Medical Diagnosis  LBP    Referring Provider  Derl Barrow, PA-C    Onset Date/Surgical Date  --   6-8weeks ago   Next MD Visit  no f/u     Prior Therapy  yes PT for TKA, and THA; none for current issue      Balance Screen   Has the patient fallen in the past 6 months  No    Has the patient had a decrease in activity level because of a fear of falling?   No    Is the patient reluctant to leave their home because of a fear of falling?   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    Leisure  sudoku, word searches      Observation/Other Assessments   Focus on Therapeutic Outcomes (FOTO)   to be completed next visit      Posture/Postural Control   Posture/Postural Control  Postural limitations    Postural Limitations  Rounded Shoulders;Forward head;Increased thoracic kyphosis;Increased lumbar lordosis      ROM / Strength   AROM / PROM / Strength  AROM;Strength      AROM   AROM Assessment Site  Lumbar    Lumbar Flexion  WNL    Lumbar Extension  50% limited    Lumbar - Right Side Bend  knee joint, pain on R    Lumbar - Left Side Bend  knee joint, pull on R    Lumbar - Right Rotation  50% limited    Lumbar - Left Rotation  50% limtied      Strength   Strength Assessment Site  Hip;Knee;Ankle    Right Hip Flexion  4/5    Right Hip Extension  3-/5    Right Hip ABduction  4-/5    Left Hip Flexion  4/5    Left Hip Extension  3-/5    Left Hip ABduction  4-/5    Right Knee Flexion  4+/5    Right Knee Extension  5/5    Left Knee Flexion  4+/5    Left Knee Extension  4+/5    Right Ankle Dorsiflexion  5/5    Left Ankle Dorsiflexion  5/5      Palpation   Palpation comment  palpable taut bands/trigger points/soft tissue restrictions of bil lumbar paraspinals with palpation to R recreating her same pain      Balance   Balance Assessed  Yes      Static Standing Balance   Static Standing - Balance Support  No upper extremity supported    Static Standing Balance -  Activities   Single Leg Stance - Right Leg;Single Leg Stance - Left Leg    Static Standing - Comment/# of Minutes  4 sec or < BLE           Objective measurements completed on examination: See above  findings.         PT Education - 05/03/18 1440    Education Details  exam findings,POC, HEP    Person(s) Educated  Patient    Methods  Explanation;Demonstration;Handout    Comprehension  Verbalized  understanding;Returned demonstration       PT Short Term Goals - 05/03/18 1451      PT SHORT TERM GOAL #1   Title  Pt will be independent with HEP and perform consistently in order to decrease pain.    Time  1    Period  Weeks    Status  New    Target Date  05/10/18      PT SHORT TERM GOAL #2   Title  Pt will not have any pain with lumbar ROM to demo improved overall mobility and maximize return to PLOF.    Time  2    Period  Weeks    Status  New    Target Date  05/17/18        PT Long Term Goals - 05/03/18 1452      PT LONG TERM GOAL #1   Title  Pt will have at 4-/5 MMT of proximal hip strength or better in order to decrease LBP and maximize functional mobility.    Time  3    Period  Weeks    Target Date  05/24/18      PT LONG TERM GOAL #2   Title  Pt will be able to perform bil SLS for 8 sec or > in order to demo improved core and functional strength.    Time  3    Period  Weeks    Status  New      PT LONG TERM GOAL #3   Title  Pt will deny pain with reaching L and with bending forward in order to demo decreased soft tissue restrictions and maximize her functional mobility with IADLs.     Time  3    Period  Weeks    Status  New      PT LONG TERM GOAL #4   Title  Pt will report participating in regular walking program at least 2x/week or > in order to decrease LBP and maximize overall health.    Time  3    Period  Weeks    Status  New             Plan - 05/03/18 1448    Clinical Impression Statement  Pt is pleasant 77YO F who presents to OPPT with c/o subacute R sided LBP for the last 6-8 weeks of insidious onset. She currently presents with deficits in core strength, proximal hip strength, functional strength, and difficulty with functional mobility due to pain. She also demo's deficits in lumbar ROM and increased soft tissue restrictions of bil lumbar paraspinals with palpation to the R recreating her same pain. Pt reporting recreation of pain with lumbar  extension and R lateral flexion, further indicating pathology with lumbar paraspinals, especially on the R. Pt needs skilled PT intervention to address these impairments in order to decrease pain and promote return to PLOF.     History and Personal Factors relevant to plan of care:  h/o osteopenia    Clinical Presentation  Stable    Clinical Presentation due to:  ROM, soft tissue restrictions, MMT, palpation, balance, core and functional strength, functional mobility, posture    Clinical Decision Making  Low    Rehab Potential  Good  PT Frequency  2x / week    PT Duration  3 weeks    PT Treatment/Interventions  ADLs/Self Care Home Management;Aquatic Therapy;Cryotherapy;Electrical Stimulation;Moist Heat;Ultrasound;Gait training;Stair training;Functional mobility training;Therapeutic activities;Therapeutic exercise;Balance training;Neuromuscular re-education;Patient/family education;Manual techniques;Passive range of motion;Dry needling;Taping    PT Next Visit Plan  review goals and HEP; initiate BLE, core, and functional strengthening; begin manual STM for soft tissue restrictions    PT Home Exercise Plan  eval: LTRs, seated lumbar flexion stretch R/L    Recommended Other Services  OT order to bil shoulder AROM deficits    Consulted and Agree with Plan of Care  Patient       Patient will benefit from skilled therapeutic intervention in order to improve the following deficits and impairments:  Decreased activity tolerance, Decreased balance, Decreased range of motion, Decreased strength, Hypomobility, Increased fascial restricitons, Increased muscle spasms, Impaired flexibility, Impaired UE functional use, Improper body mechanics, Postural dysfunction, Pain  Visit Diagnosis: Acute right-sided low back pain without sciatica - Plan: PT plan of care cert/re-cert  Muscle spasm of back - Plan: PT plan of care cert/re-cert  Muscle weakness (generalized) - Plan: PT plan of care  cert/re-cert     Problem List Patient Active Problem List   Diagnosis Date Noted  . Bilateral lower extremity edema 02/14/2018  . Essential hypertension 02/14/2018  . Hyperlipidemia 02/14/2018  . Family history of heart disease 02/14/2018  . Onychomycosis 08/26/2016  . OA (osteoarthritis) of knee 12/13/2014       Geraldine Solar PT, DPT  [facility] [address] [place], [place], [address] Phone: [phone]   Fax:  [phone]  Name: AVIV ROTA MRN: [id number] Date of Birth: 1940/10/18

## 2018-05-04 ENCOUNTER — Encounter (HOSPITAL_COMMUNITY): Payer: Self-pay

## 2018-05-04 ENCOUNTER — Ambulatory Visit (HOSPITAL_COMMUNITY): Payer: Medicare Other

## 2018-05-04 DIAGNOSIS — M62830 Muscle spasm of back: Secondary | ICD-10-CM

## 2018-05-04 DIAGNOSIS — M545 Low back pain, unspecified: Secondary | ICD-10-CM

## 2018-05-04 DIAGNOSIS — M6281 Muscle weakness (generalized): Secondary | ICD-10-CM | POA: Diagnosis not present

## 2018-05-04 NOTE — Therapy (Signed)
[place], [place], [address] Phone: [phone]   Fax:  [phone]  Physical Therapy Treatment  Patient Details  Name: [name] MRN: [id number] Date of Birth: 1940-10-08 Referring Provider: Derl Barrow, PA-C   Encounter Date: 05/04/2018  PT End of Session - 05/04/18 1613    Visit Number  2    Number of Visits  7    Date for PT Re-Evaluation  05/24/18    Authorization Type  Generic [employer] (Medicare)    Authorization Time Period  05/03/18 to 05/24/18    Authorization - Visit Number  2    Authorization - Number of Visits  10    PT Start Time  1608    PT Stop Time  1649    PT Time Calculation (min)  41 min    Activity Tolerance  Patient tolerated treatment well    Behavior During Therapy  [facility] for tasks assessed/performed       Past Medical History:  Diagnosis Date  . Arthritis   . Bladder spasms    "spastic bladder" wears pads  . Cancer ([place])    Skin cancer -scalp and eyebrow"basal cell"  . Complication of anesthesia    woke up too early from a surgery  . GERD (gastroesophageal reflux disease)    occ. frequent "hiccoughs" after "eating, indigestion"  . Hyperlipemia   . Hypertension   . Hypothyroidism   . Trigger finger, left    alternate fingers affected. -Occ. shooting pain.  . Wears glasses     Past Surgical History:  Procedure Laterality Date  . ABDOMINAL HYSTERECTOMY  1989  . APPENDECTOMY    . CARDIAC CATHETERIZATION  2006   normal-done for work up pre hip surgeries  . CARPAL TUNNEL RELEASE Left   . CHOLECYSTECTOMY  1997  . JOINT REPLACEMENT     BTHA  . POLYPECTOMY Bilateral 06/26/2013   Procedure: BILATERAL EXCISION OF NASAL POLYPS ;  Surgeon: Ascencion Dike, MD;  Location: [place];  Service: ENT;  Laterality: Bilateral;  . SEPTOPLASTY N/A 06/26/2013   Procedure: AND SEPTOPLASTY;  Surgeon: Ascencion Dike, MD;  Location: [place];  Service: ENT;   Laterality: N/A;  . THYROIDECTOMY  1994  . TONSILLECTOMY    . TOTAL HIP ARTHROPLASTY  2006   right  . TOTAL HIP ARTHROPLASTY  2006   left  . TOTAL KNEE ARTHROPLASTY Right 12/13/2014   Procedure: RIGHT TOTAL KNEE ARTHROPLASTY;  Surgeon: Gaynelle Arabian, MD;  Location[facility];  Service: Orthopedics;  Laterality: Right;    There were no vitals filed for this visit.  Subjective Assessment - 05/04/18 1611    Subjective  Pt stated she is feeling good today, no reports of current pain stated pain comes with reaching.  Reports she has been busy lately, had to drive her mother to [facility] this morning.  Reports she has not began HEP exercises yet.      Patient Stated Goals  for pain to go away    Currently in Pain?  No/denies         [facility] PT Assessment - 05/04/18 0001      Assessment   Medical Diagnosis  LBP    Referring Provider  Derl Barrow, PA-C    Onset Date/Surgical Date  --   6-8 weeks ago   Next MD Visit  no f/u     Prior Therapy  yes PT for TKA, and  THA; none for current issue      Observation/Other Assessments   Focus on Therapeutic Outcomes (FOTO)   44% limitation                   [facility] Adult PT Treatment/Exercise - 05/04/18 0001      Exercises   Exercises  Lumbar      Lumbar Exercises: Stretches   Single Knee to Chest Stretch  2 reps;30 seconds    Lower Trunk Rotation  5 reps;10 seconds    Other Lumbar Stretch Exercise  seated lumbar flexion with theraball      Lumbar Exercises: Supine   Bridge  10 reps;3 seconds      Manual Therapy   Manual Therapy  Soft tissue mobilization    Manual therapy comments  Manual complete separate than rest of tx    Soft tissue mobilization  prone position STM to lumbar paraspinals             PT Education - 05/04/18 1626    Education Details  Reviewed goals and copy of eval given to pt.  Educated importnace of complaince iwht HEP for maximal benefits    Person(s) Educated  Patient    Methods   Explanation;Demonstration;Handout    Comprehension  Verbalized understanding;Returned demonstration       PT Short Term Goals - 05/03/18 1451      PT SHORT TERM GOAL #1   Title  Pt will be independent with HEP and perform consistently in order to decrease pain.    Time  1    Period  Weeks    Status  New    Target Date  05/10/18      PT SHORT TERM GOAL #2   Title  Pt will not have any pain with lumbar ROM to demo improved overall mobility and maximize return to PLOF.    Time  2    Period  Weeks    Status  New    Target Date  05/17/18        PT Long Term Goals - 05/03/18 1452      PT LONG TERM GOAL #1   Title  Pt will have at 4-/5 MMT of proximal hip strength or better in order to decrease LBP and maximize functional mobility.    Time  3    Period  Weeks    Target Date  05/24/18      PT LONG TERM GOAL #2   Title  Pt will be able to perform bil SLS for 8 sec or > in order to demo improved core and functional strength.    Time  3    Period  Weeks    Status  New      PT LONG TERM GOAL #3   Title  Pt will deny pain with reaching L and with bending forward in order to demo decreased soft tissue restrictions and maximize her functional mobility with IADLs.     Time  3    Period  Weeks    Status  New      PT LONG TERM GOAL #4   Title  Pt will report participating in regular walking program at least 2x/week or > in order to decrease LBP and maximize overall health.    Time  3    Period  Weeks    Status  New            Plan - 05/04/18 1628    Clinical  Impression Statement  Reviewed goals and copy of eval given to pt.  Pt stated she has been busy since eval (only yesterday) and hasn't began HEP yet.  Pt educated on benefits of compliance with HEP for maximal benefits with verbalized understanding.  Session focus iwht lumbar mobilty and proximal strengthening.  Pt able to recall the seated lumbar flexion exercise and SKTC, reminder for time hold and LTR exercises.  Added  bridges for core and proximal strengthening.  EOS with manual soft tissue mobilization to address restrictions in lumbar paraspinals Rt side today.  No reoprts of pain through session.      Rehab Potential  Good    PT Frequency  2x / week    PT Duration  3 weeks    PT Next Visit Plan  Review compliance wiht HEP; initiate BLE, core, and functional strengthening; begin manual STM for soft tissue restrictions    PT Home Exercise Plan  eval: LTRs, seated lumbar flexion stretch R/L       Patient will benefit from skilled therapeutic intervention in order to improve the following deficits and impairments:  Decreased activity tolerance, Decreased balance, Decreased range of motion, Decreased strength, Hypomobility, Increased fascial restricitons, Increased muscle spasms, Impaired flexibility, Impaired UE functional use, Improper body mechanics, Postural dysfunction, Pain  Visit Diagnosis: Acute right-sided low back pain without sciatica  Muscle spasm of back  Muscle weakness (generalized)     Problem List Patient Active Problem List   Diagnosis Date Noted  . Bilateral lower extremity edema 02/14/2018  . Essential hypertension 02/14/2018  . Hyperlipidemia 02/14/2018  . Family history of heart disease 02/14/2018  . Onychomycosis 08/26/2016  . OA (osteoarthritis) of knee 12/13/2014   Ihor Austin, LPTA; CBIS [phone]  Aldona Lento 05/04/2018, 6:03 PM  [place], [place], [address] Phone: [phone]   Fax:  [phone]  Name: [name] MRN: [id number] Date of Birth: 02-Jul-1941

## 2018-05-05 DIAGNOSIS — M65332 Trigger finger, left middle finger: Secondary | ICD-10-CM | POA: Diagnosis not present

## 2018-05-09 ENCOUNTER — Telehealth (HOSPITAL_COMMUNITY): Payer: Self-pay | Admitting: Family Medicine

## 2018-05-09 NOTE — Telephone Encounter (Signed)
05/09/18  pt left a message to cx this weeks appts because she had hand surgery last week.... goes back to dr on 9/9 and thinks it would be better to see about doing therapy after that drs appt.

## 2018-05-10 ENCOUNTER — Encounter (HOSPITAL_COMMUNITY): Payer: 59

## 2018-05-11 ENCOUNTER — Encounter (HOSPITAL_COMMUNITY): Payer: 59

## 2018-05-12 ENCOUNTER — Telehealth (HOSPITAL_COMMUNITY): Payer: Self-pay

## 2018-05-12 NOTE — Telephone Encounter (Signed)
She have a MD visit out of town so she will be coming for OT only on that day

## 2018-05-15 ENCOUNTER — Ambulatory Visit (HOSPITAL_COMMUNITY): Payer: Medicare Other

## 2018-05-15 ENCOUNTER — Encounter (HOSPITAL_COMMUNITY): Payer: Self-pay

## 2018-05-15 ENCOUNTER — Telehealth (HOSPITAL_COMMUNITY): Payer: Self-pay | Admitting: Family Medicine

## 2018-05-15 NOTE — Telephone Encounter (Signed)
05/15/18  CX THERAPIST OUT TODAY AND PT WAS UNSURE WHY SHE WAS EVEN COMING WITH HER HAND BANDAGED.  I TOLD HER I WOULD CALL AFTER HER APPT TODAY WITH DR TO SEE WHAT HE TOLD HE

## 2018-05-16 ENCOUNTER — Encounter (HOSPITAL_COMMUNITY): Payer: 59

## 2018-05-18 ENCOUNTER — Telehealth (HOSPITAL_COMMUNITY): Payer: Self-pay | Admitting: Family Medicine

## 2018-05-18 NOTE — Telephone Encounter (Signed)
05/18/18  Pt called and cx today's appt.

## 2018-05-19 ENCOUNTER — Ambulatory Visit (HOSPITAL_COMMUNITY): Payer: Medicare Other

## 2018-05-23 ENCOUNTER — Ambulatory Visit (HOSPITAL_COMMUNITY): Payer: Medicare Other | Attending: Student

## 2018-05-23 ENCOUNTER — Encounter

## 2018-05-23 ENCOUNTER — Encounter (HOSPITAL_COMMUNITY): Payer: Self-pay

## 2018-05-23 DIAGNOSIS — M545 Low back pain, unspecified: Secondary | ICD-10-CM

## 2018-05-23 DIAGNOSIS — G8929 Other chronic pain: Secondary | ICD-10-CM | POA: Diagnosis not present

## 2018-05-23 DIAGNOSIS — M25511 Pain in right shoulder: Secondary | ICD-10-CM | POA: Insufficient documentation

## 2018-05-23 DIAGNOSIS — M25512 Pain in left shoulder: Secondary | ICD-10-CM | POA: Insufficient documentation

## 2018-05-23 DIAGNOSIS — M6281 Muscle weakness (generalized): Secondary | ICD-10-CM | POA: Diagnosis not present

## 2018-05-23 DIAGNOSIS — M25611 Stiffness of right shoulder, not elsewhere classified: Secondary | ICD-10-CM | POA: Diagnosis not present

## 2018-05-23 DIAGNOSIS — R29898 Other symptoms and signs involving the musculoskeletal system: Secondary | ICD-10-CM | POA: Insufficient documentation

## 2018-05-23 DIAGNOSIS — M25612 Stiffness of left shoulder, not elsewhere classified: Secondary | ICD-10-CM | POA: Diagnosis not present

## 2018-05-23 DIAGNOSIS — M62830 Muscle spasm of back: Secondary | ICD-10-CM | POA: Diagnosis not present

## 2018-05-23 NOTE — Therapy (Signed)
[facility] [address] [place], [place], [address] Phone: [phone]   Fax:  [phone]   PHYSICAL THERAPY DISCHARGE SUMMARY  Visits from Start of Care: 3  Current functional level related to goals / functional outcomes: See below   Remaining deficits: See below   Education / Equipment: HEP  Plan: Patient agrees to discharge.  Patient goals were partially met. Patient is being discharged due to being pleased with the current functional level.  ?????    Physical Therapy Treatment  Patient Details  Name: BRITANIA SHREEVE MRN: [id number] Date of Birth: [birth year] Referring Provider: Derl Barrow, PA-C   Encounter Date: 05/23/2018  PT End of Session - 05/23/18 1033    Visit Number  3    Number of Visits  7    Date for PT Re-Evaluation  05/24/18    Authorization Type  Generic [employer] (Medicare)    Authorization Time Period  05/03/18 to 05/24/18    Authorization - Visit Number  3    Authorization - Number of Visits  10    PT Start Time  7591    PT Stop Time  1100    PT Time Calculation (min)  28 min    Activity Tolerance  Patient tolerated treatment well    Behavior During Therapy  [facility] for tasks assessed/performed       Past Medical History:  Diagnosis Date  . Arthritis   . Bladder spasms    "spastic bladder" wears pads  . Cancer ([place])    Skin cancer -scalp and eyebrow"basal cell"  . Complication of anesthesia    woke up too early from a surgery  . GERD (gastroesophageal reflux disease)    occ. frequent "hiccoughs" after "eating, indigestion"  . Hyperlipemia   . Hypertension   . Hypothyroidism   . Trigger finger, left    alternate fingers affected. -Occ. shooting pain.  . Wears glasses     Past Surgical History:  Procedure Laterality Date  . ABDOMINAL HYSTERECTOMY  1989  . APPENDECTOMY    . CARDIAC CATHETERIZATION  2006   normal-done for work up pre hip surgeries  . CARPAL TUNNEL RELEASE Left   .  CHOLECYSTECTOMY  1997  . JOINT REPLACEMENT     BTHA  . POLYPECTOMY Bilateral 06/26/2013   Procedure: BILATERAL EXCISION OF NASAL POLYPS ;  Surgeon: Ascencion Dike, MD;  Location: [facility];  Service: ENT;  Laterality: Bilateral;  . SEPTOPLASTY N/A 06/26/2013   Procedure: AND SEPTOPLASTY;  Surgeon: Ascencion Dike, MD;  Location: [facility];  Service: ENT;  Laterality: N/A;  . THYROIDECTOMY  1994  . TONSILLECTOMY    . TOTAL HIP ARTHROPLASTY  2006   right  . TOTAL HIP ARTHROPLASTY  2006   left  . TOTAL KNEE ARTHROPLASTY Right 12/13/2014   Procedure: RIGHT TOTAL KNEE ARTHROPLASTY;  Surgeon: Gaynelle Arabian, MD;  Location: [facility];  Service: Orthopedics;  Laterality: Right;    There were no vitals filed for this visit.  Subjective Assessment - 05/23/18 1033    Subjective  Pt states that her LBP has been good, she states that she thinks the massage that the PTA performed helped to work that muscle out.    Patient Stated Goals  for pain to go away    Currently in Pain?  No/denies         [facility] PT Assessment - 05/23/18 0001      Assessment  Medical Diagnosis  LBP    Referring Provider  Derl Barrow, PA-C    Onset Date/Surgical Date  --   6-8 weeks ago   Next MD Visit  no f/u     Prior Therapy  yes PT for TKA, and THA; none for current issue      AROM   AROM Assessment Site  Lumbar    Lumbar Extension  50% limited   was 50% limited   Lumbar - Right Side Bend  past knee joint, no pain   was knee joint, pain on R   Lumbar - Left Side Bend  past knee joint, no pain   was knee joint, pain on R   Lumbar - Right Rotation  50% limited, no pain   was 50% limited   Lumbar - Left Rotation  50% limited no pain   was 50% limited     Strength   Right Hip Flexion  4+/5   was 4   Right Hip Extension  4-/5   was 3-    Right Hip ABduction  4/5   was 4-   Left Hip Flexion  4+/5   was 4   Left Hip Extension  4-/5   was 3-   Left Hip ABduction  4/5   was 4-      Static Standing Balance   Static Standing - Balance Support  No upper extremity supported    Static Standing Balance -  Activities   Single Leg Stance - Right Leg;Single Leg Stance - Left Leg    Static Standing - Comment/# of Minutes  3 sec or < BLE          PT Education - 05/23/18 1103    Education Details  discharge plans    Person(s) Educated  Patient    Methods  Explanation;Handout    Comprehension  Verbalized understanding         PT Short Term Goals - 05/23/18 1037      PT SHORT TERM GOAL #1   Title  Pt will be independent with HEP and perform consistently in order to decrease pain.    Time  1    Period  Weeks    Status  Achieved      PT SHORT TERM GOAL #2   Title  Pt will not have any pain with lumbar ROM to demo improved overall mobility and maximize return to PLOF.    Baseline  9/17: see AROM, no pain wiht any ROM    Time  2    Period  Weeks    Status  Partially Met        PT Long Term Goals - 05/23/18 1038      PT LONG TERM GOAL #1   Title  Pt will have at 4-/5 MMT of proximal hip strength or better in order to decrease LBP and maximize functional mobility.    Baseline  9/17: see MMT    Time  3    Period  Weeks    Status  Achieved      PT LONG TERM GOAL #2   Title  Pt will be able to perform bil SLS for 8 sec or > in order to demo improved core and functional strength.    Baseline  9/17: 3 sec or < BLE    Time  3    Period  Weeks    Status  On-going      PT LONG TERM GOAL #3  Title  Pt will deny pain with reaching L and with bending forward in order to demo decreased soft tissue restrictions and maximize her functional mobility with IADLs.     Baseline  9/17: agrees with this statement    Time  3    Period  Weeks    Status  Achieved      PT LONG TERM GOAL #4   Title  Pt will report participating in regular walking program at least 2x/week or > in order to decrease LBP and maximize overall health.    Time  3    Period  Weeks    Status   New            Plan - 05/23/18 1102    Clinical Impression Statement  Pt presents to therapy stating that her back has been feeling fine for the last few weeks. She requested to be discharged today since she has not been in pain and has OT starting for her shoulder next week. Pt has met almost all goals with her balance and lumbar ROM being the only goals that she did not fully meet. PT updated her strengthening and balance HEP this date and she verbalized understanding. Due to progress made and patient request, pt will be discharged at this time.     Rehab Potential  Good    PT Frequency  2x / week    PT Duration  3 weeks    PT Next Visit Plan  discharged to HEP    PT Home Exercise Plan  eval: LTRs, seated lumbar flexion stretch R/L; 9/17: bridge, sidelying clams, sidelying hip abd, st hip abd, st hip ext, SLS, tandem stance    Consulted and Agree with Plan of Care  Patient       Patient will benefit from skilled therapeutic intervention in order to improve the following deficits and impairments:  Decreased activity tolerance, Decreased balance, Decreased range of motion, Decreased strength, Hypomobility, Increased fascial restricitons, Increased muscle spasms, Impaired flexibility, Impaired UE functional use, Improper body mechanics, Postural dysfunction, Pain  Visit Diagnosis: Acute right-sided low back pain without sciatica  Muscle spasm of back  Muscle weakness (generalized)     Problem List Patient Active Problem List   Diagnosis Date Noted  . Bilateral lower extremity edema 02/14/2018  . Essential hypertension 02/14/2018  . Hyperlipidemia 02/14/2018  . Family history of heart disease 02/14/2018  . Onychomycosis 08/26/2016  . OA (osteoarthritis) of knee 12/13/2014       Geraldine Solar PT, DPT  [facility] [address] [place], [place], [address] Phone: [phone]   Fax:  [phone]  Name: EMELI GOGUEN MRN:  [id number] Date of Birth: 09-24-1940

## 2018-05-23 NOTE — Patient Instructions (Signed)
Access Code: [email or web address]  URL: [email or web address]  Date: 05/23/2018  Prepared by: Geraldine Solar   Exercises Supine Bridge - 10 reps - 3 sets - 1x daily - 7x weekly Clamshell with Resistance - 10 reps - 3 sets - 1x daily - 7x weekly Sidelying Hip Abduction - 10 reps - 3 sets - 1x daily - 7x weekly Standing Hip Abduction with Resistance at Ankles - 10 reps - 3 sets - 1x daily - 7x weekly Standing Hip Extension Kicks - 10 reps - 3 sets - 1x daily - 7x weekly Single Leg Stance with Support - 10 reps - 3 sets - 1x daily - 7x weekly Tandem Stance with Support - 10 reps - 3 sets - 1x daily - 7x weekly

## 2018-05-26 ENCOUNTER — Encounter (HOSPITAL_COMMUNITY): Payer: 59

## 2018-05-30 ENCOUNTER — Encounter (HOSPITAL_COMMUNITY): Payer: 59

## 2018-06-01 ENCOUNTER — Encounter (HOSPITAL_COMMUNITY): Payer: Self-pay | Admitting: Occupational Therapy

## 2018-06-01 ENCOUNTER — Other Ambulatory Visit: Payer: Self-pay

## 2018-06-01 ENCOUNTER — Ambulatory Visit (HOSPITAL_COMMUNITY): Payer: Medicare Other | Admitting: Occupational Therapy

## 2018-06-01 DIAGNOSIS — M25512 Pain in left shoulder: Principal | ICD-10-CM

## 2018-06-01 DIAGNOSIS — M25612 Stiffness of left shoulder, not elsewhere classified: Secondary | ICD-10-CM

## 2018-06-01 DIAGNOSIS — M62830 Muscle spasm of back: Secondary | ICD-10-CM | POA: Diagnosis not present

## 2018-06-01 DIAGNOSIS — G8929 Other chronic pain: Secondary | ICD-10-CM

## 2018-06-01 DIAGNOSIS — M545 Low back pain: Secondary | ICD-10-CM | POA: Diagnosis not present

## 2018-06-01 DIAGNOSIS — M25511 Pain in right shoulder: Secondary | ICD-10-CM

## 2018-06-01 DIAGNOSIS — M25611 Stiffness of right shoulder, not elsewhere classified: Secondary | ICD-10-CM

## 2018-06-01 DIAGNOSIS — M6281 Muscle weakness (generalized): Secondary | ICD-10-CM | POA: Diagnosis not present

## 2018-06-01 DIAGNOSIS — R29898 Other symptoms and signs involving the musculoskeletal system: Secondary | ICD-10-CM

## 2018-06-01 NOTE — Patient Instructions (Signed)

## 2018-06-01 NOTE — Therapy (Signed)
[facility] [place], [place], [address] Phone: [phone]   Fax:  [phone]  Occupational Therapy Evaluation  Patient Details  Name: [name] MRN: [id number] Date of Birth: Jun 17, 1941 Referring provider: Dr. Netta Cedars  Encounter Date: 06/01/2018  OT End of Session - 06/01/18 1556    Visit Number  1    Number of Visits  8    Date for OT Re-Evaluation  07/01/18    Authorization Type  1) Medicare A & B 2) Generic [employer]    Authorization Time Period  No visit limit, based on medical necessity    OT Start Time  1349    OT Stop Time  1430    OT Time Calculation (min)  41 min    Activity Tolerance  Patient tolerated treatment well    Behavior During Therapy  [facility] for tasks assessed/performed       Past Medical History:  Diagnosis Date  . Arthritis   . Bladder spasms    "spastic bladder" wears pads  . Cancer ([place])    Skin cancer -scalp and eyebrow"basal cell"  . Complication of anesthesia    woke up too early from a surgery  . GERD (gastroesophageal reflux disease)    occ. frequent "hiccoughs" after "eating, indigestion"  . Hyperlipemia   . Hypertension   . Hypothyroidism   . Trigger finger, left    alternate fingers affected. -Occ. shooting pain.  . Wears glasses     Past Surgical History:  Procedure Laterality Date  . ABDOMINAL HYSTERECTOMY  1989  . APPENDECTOMY    . CARDIAC CATHETERIZATION  2006   normal-done for work up pre hip surgeries  . CARPAL TUNNEL RELEASE Left   . CHOLECYSTECTOMY  1997  . JOINT REPLACEMENT     BTHA  . POLYPECTOMY Bilateral 06/26/2013   Procedure: BILATERAL EXCISION OF NASAL POLYPS ;  Surgeon: Ascencion Dike, MD;  Location: [facility];  Service: ENT;  Laterality: Bilateral;  . SEPTOPLASTY N/A 06/26/2013   Procedure: AND SEPTOPLASTY;  Surgeon: Ascencion Dike, MD;  Location: [facility];  Service: ENT;  Laterality: N/A;  . THYROIDECTOMY  1994  .  TONSILLECTOMY    . TOTAL HIP ARTHROPLASTY  2006   right  . TOTAL HIP ARTHROPLASTY  2006   left  . TOTAL KNEE ARTHROPLASTY Right 12/13/2014   Procedure: RIGHT TOTAL KNEE ARTHROPLASTY;  Surgeon: Gaynelle Arabian, MD;  Location: [facility];  Service: Orthopedics;  Laterality: Right;    There were no vitals filed for this visit.  Subjective Assessment - 06/01/18 1554    Subjective   S: I've had shoulder issues for a while now.     Pertinent History  Pt is a 77 y/o female presenting with chronic shoulder pain and ROM limitations which have been increasing in severity over the past year. Pt reports doing exercises for her shoulder many years ago but cannot remember why. Pt was referred to occupational therapy for evaluation and treatment by Dr. Netta Cedars    Special Tests  FOTO score: 61/100    Patient Stated Goals  To be able to reach higher and use my arms more.     Currently in Pain?  No/denies        [facility] OT Assessment - 06/01/18 1349      Assessment   Medical Diagnosis Referring provider  bilateral shoulder pain Dr. Netta Cedars   Onset Date/Surgical Date  --  chronic   Hand Dominance  Right    Next MD Visit  no f/u     Prior Therapy  None for her shoulders      Precautions   Precautions  None      Restrictions   Weight Bearing Restrictions  No      Balance Screen   Has the patient fallen in the past 6 months  No    Has the patient had a decrease in activity level because of a fear of falling?   No    Is the patient reluctant to leave their home because of a fear of falling?   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       Written Expression   Dominant Hand  Right       Cognition   Overall Cognitive Status  Within Functional Limits for tasks assessed      Observation/Other Assessments   Focus on Therapeutic Outcomes (FOTO)   61/100      ROM / Strength   AROM / PROM / Strength  AROM;PROM;Strength      Palpation   Palpation comment  Mod fascial restrictions in bilateral trapezius regions, min in scapularis regions      AROM   Overall AROM Comments  Assessed seated, er/IR adducted    AROM Assessment Site  Shoulder    Right/Left Shoulder  Right;Left    Right Shoulder Flexion  105 Degrees    Right Shoulder ABduction  95 Degrees    Right Shoulder Internal Rotation  90 Degrees    Right Shoulder External Rotation  68 Degrees    Left Shoulder Flexion  105 Degrees    Left Shoulder ABduction  112 Degrees    Left Shoulder Internal Rotation  90 Degrees    Left Shoulder External Rotation  34 Degrees      PROM   Overall PROM Comments  Assessed supine, er/IR adducted    PROM Assessment Site  Shoulder    Right/Left Shoulder  Right;Left    Right Shoulder Flexion  120 Degrees    Right Shoulder ABduction  170 Degrees    Right Shoulder Internal Rotation  90 Degrees    Right Shoulder External Rotation  41 Degrees    Left Shoulder Flexion  123 Degrees    Left Shoulder ABduction  121 Degrees    Left Shoulder Internal Rotation  90 Degrees    Left Shoulder External Rotation  43 Degrees      Strength   Overall Strength Comments  Assessed seated, er/IR adducted    Strength Assessment Site  Shoulder    Right/Left Shoulder  Right;Left    Right Shoulder Flexion  3+/5    Right Shoulder ABduction  3+/5    Right Shoulder Internal Rotation  4-/5    Right Shoulder External Rotation  3+/5                      OT Education - 06/01/18 1556    Education Details  shoulder stretches    Person(s) Educated  Patient    Methods  Explanation;Demonstration;Handout  Comprehension  Verbalized understanding;Returned demonstration       OT Short Term Goals -  06/01/18 1601      OT SHORT TERM GOAL #1   Title  Pt will be provided with and independent in HEP to improve mobility required for ADL completion using BUE.     Time  4    Period  Weeks    Status  New    Target Date  07/01/18      OT SHORT TERM GOAL #2   Title  Pt will decrease pain in BUE to 3/10 or less to improve ability to perform bathing tasks-washing behind back, washing hair, etc.     Time  4    Period  Weeks    Status  New      OT SHORT TERM GOAL #3   Title  Pt will decrease fascial restrictions in BUE to minimal amounts or less to improve ability to perform functional reaching tasks.     Time  4    Period  Weeks    Status  New      OT SHORT TERM GOAL #4   Title  Pt will improve BUE ROM to [facility] to improve ability to donn/doff bra and reaching overhead/behind back.     Time  4    Period  Weeks    Status  New      OT SHORT TERM GOAL #5   Title  Pt will improve BUE strength to 4/5 to improve ability to perform caregiving tasks for her Mother.     Time  4    Period  Weeks    Status  New               Plan - 06/01/18 1558    Clinical Impression Statement  A: Pt is a 77 y/o female presenting with chronic bilateral shoulder pain that is limiting ability to perform ADLs and functional reaching tasks. Pt reports she has been experiencing more ROM limitations and decreased ability to reach for and lift items lately. Provided with shoulder stretching HEP today.     Occupational Profile and client history currently impacting functional performance  Pt is independent at baseline and cares for her mother, is motivated to return to highest level of functioning.     Occupational performance deficits (Please refer to evaluation for details):  ADL's;IADL's;Work;Social Participation    Rehab Potential  Good    OT Frequency  2x / week    OT Duration  4 weeks    OT Treatment/Interventions  Self-care/ADL training;Therapeutic exercise;Ultrasound;Manual Therapy;Therapeutic  activities;Cryotherapy;Electrical Stimulation;Moist Heat;Passive range of motion;Patient/family education    Plan  P: Pt will benefit from skilled OT services to decrease pain and fascial restrictions, increase ROM, strength, and functional use of BUE during ADL completion. Treatment plan: myofascial release, manual therapy, P/ROM, A/ROM, shoulder stretching, scapular mobility/stability/strengthening, general BUE strengthening, modalities prn    Clinical Decision Making  Limited treatment options, no task modification necessary    OT Home Exercise Plan  9/26: shoulder stretches    Consulted and Agree with Plan of Care  Patient       Patient will benefit from skilled therapeutic intervention in order to improve the following deficits and impairments:  Decreased activity tolerance, Decreased strength, Impaired flexibility, Decreased range of motion, Pain, Increased fascial restrictions, Impaired UE functional use  Visit Diagnosis: Chronic left shoulder pain  Chronic right shoulder pain  Stiffness of left shoulder, not elsewhere classified  Stiffness of right  shoulder, not elsewhere classified  Other symptoms and signs involving the musculoskeletal system    Problem List Patient Active Problem List   Diagnosis Date Noted  . Bilateral lower extremity edema 02/14/2018  . Essential hypertension 02/14/2018  . Hyperlipidemia 02/14/2018  . Family history of heart disease 02/14/2018  . Onychomycosis 08/26/2016  . OA (osteoarthritis) of knee 12/13/2014   Guadelupe Sabin, OTR/L  [phone] 06/01/2018, 4:05 PM  [facility] [place], [place], [address] Phone: [phone]   Fax:  [phone]  Name: NOELIA LENART MRN: [id number] Date of Birth: [birth year]

## 2018-06-02 ENCOUNTER — Encounter (HOSPITAL_COMMUNITY): Payer: 59

## 2018-06-05 ENCOUNTER — Ambulatory Visit (HOSPITAL_COMMUNITY): Payer: Medicare Other | Admitting: Specialist

## 2018-06-05 ENCOUNTER — Encounter (HOSPITAL_COMMUNITY): Payer: Self-pay | Admitting: Specialist

## 2018-06-05 DIAGNOSIS — M25511 Pain in right shoulder: Secondary | ICD-10-CM | POA: Diagnosis not present

## 2018-06-05 DIAGNOSIS — M25612 Stiffness of left shoulder, not elsewhere classified: Secondary | ICD-10-CM

## 2018-06-05 DIAGNOSIS — M62830 Muscle spasm of back: Secondary | ICD-10-CM | POA: Diagnosis not present

## 2018-06-05 DIAGNOSIS — G8929 Other chronic pain: Secondary | ICD-10-CM | POA: Diagnosis not present

## 2018-06-05 DIAGNOSIS — M25512 Pain in left shoulder: Secondary | ICD-10-CM | POA: Diagnosis not present

## 2018-06-05 DIAGNOSIS — M25611 Stiffness of right shoulder, not elsewhere classified: Secondary | ICD-10-CM

## 2018-06-05 DIAGNOSIS — M545 Low back pain: Secondary | ICD-10-CM | POA: Diagnosis not present

## 2018-06-05 DIAGNOSIS — M6281 Muscle weakness (generalized): Secondary | ICD-10-CM | POA: Diagnosis not present

## 2018-06-05 NOTE — Therapy (Signed)
[place], [place], [address] Phone: [phone]   Fax:  [phone]  Occupational Therapy Treatment  Patient Details  Name: [name] MRN: [id number] Date of Birth: 1941/01/14 No data recorded  Encounter Date: 06/05/2018  OT End of Session - 06/05/18 1259    Visit Number  2    Number of Visits  8    Date for OT Re-Evaluation  07/01/18    Authorization Type  1) Medicare A & B 2) Generic [employer]    Authorization Time Period  No visit limit, based on medical necessity    OT Start Time  1127   patient arrived late for scheduled appt   OT Stop Time  1205    OT Time Calculation (min)  38 min    Activity Tolerance  Patient tolerated treatment well    Behavior During Therapy  [facility] for tasks assessed/performed       Past Medical History:  Diagnosis Date  . Arthritis   . Bladder spasms    "spastic bladder" wears pads  . Cancer ([place])    Skin cancer -scalp and eyebrow"basal cell"  . Complication of anesthesia    woke up too early from a surgery  . GERD (gastroesophageal reflux disease)    occ. frequent "hiccoughs" after "eating, indigestion"  . Hyperlipemia   . Hypertension   . Hypothyroidism   . Trigger finger, left    alternate fingers affected. -Occ. shooting pain.  . Wears glasses     Past Surgical History:  Procedure Laterality Date  . ABDOMINAL HYSTERECTOMY  1989  . APPENDECTOMY    . CARDIAC CATHETERIZATION  2006   normal-done for work up pre hip surgeries  . CARPAL TUNNEL RELEASE Left   . CHOLECYSTECTOMY  1997  . JOINT REPLACEMENT     BTHA  . POLYPECTOMY Bilateral 06/26/2013   Procedure: BILATERAL EXCISION OF NASAL POLYPS ;  Surgeon: Ascencion Dike, MD;  Location: [place];  Service: ENT;  Laterality: Bilateral;  . SEPTOPLASTY N/A 06/26/2013   Procedure: AND SEPTOPLASTY;  Surgeon: Ascencion Dike, MD;  Location: [place];  Service: ENT;  Laterality: N/A;  .  THYROIDECTOMY  1994  . TONSILLECTOMY    . TOTAL HIP ARTHROPLASTY  2006   right  . TOTAL HIP ARTHROPLASTY  2006   left  . TOTAL KNEE ARTHROPLASTY Right 12/13/2014   Procedure: RIGHT TOTAL KNEE ARTHROPLASTY;  Surgeon: Gaynelle Arabian, MD;  [facility];  Service: Orthopedics;  Laterality: Right;    There were no vitals filed for this visit.  Subjective Assessment - 06/05/18 1257    Subjective   S:  I was a bad patient, I thought about doing my exercises, but I didnt do them     Currently in Pain?  Yes    Pain Score  3     Pain Location  Shoulder    Pain Orientation  Right;Left    Pain Descriptors / Indicators  Aching    Pain Type  Chronic pain    Pain Radiating Towards  upper arm    Pain Onset  More than a month ago    Pain Frequency  Intermittent    Aggravating Factors   with movement above 90    Pain Relieving Factors  rest    Effect of Pain on Daily Activities  minimal         [facility] OT Assessment - 06/05/18 0001  Assessment   Medical Diagnosis  bilateral shoulder pain   Dr. Esmond Plants referring provider     Precautions   Precautions  None      Restrictions   Weight Bearing Restrictions  No               OT Treatments/Exercises (OP) - 06/05/18 0001      Exercises   Exercises  Shoulder      Shoulder Exercises: Supine   Protraction  PROM;5 reps;AAROM;10 reps    Horizontal ABduction  PROM;5 reps;AAROM;10 reps    External Rotation  PROM;5 reps;AAROM;10 reps    Internal Rotation  PROM;5 reps;AAROM;10 reps    Flexion  PROM;5 reps;AAROM;10 reps    ABduction  PROM;5 reps;AAROM;10 reps      Shoulder Exercises: Seated   Elevation  AROM;10 reps    Extension  AROM;10 reps    Row  AROM;10 reps      Manual Therapy   Manual Therapy  Myofascial release    Manual therapy comments  Manual complete separate than rest of tx    Myofascial Release  myofascial release and manual stretching to bilateral upper arm, scapular, shoulder regions, and associated  areas to decrease pain and restrictions and improve pain free mobility needed for improved independence with ADLs             OT Education - 06/05/18 1258    Education Details  reviewed plan of care and goals and issued patient a written copy    Person(s) Educated  Patient    Methods  Explanation;Handout    Comprehension  Verbalized understanding       OT Short Term Goals - 06/05/18 1302      OT SHORT TERM GOAL #1   Title  Pt will be provided with and independent in HEP to improve mobility required for ADL completion using BUE.     Time  4    Period  Weeks    Status  On-going      OT SHORT TERM GOAL #2   Title  Pt will decrease pain in BUE to 3/10 or less to improve ability to perform bathing tasks-washing behind back, washing hair, etc.     Time  4    Period  Weeks    Status  On-going      OT SHORT TERM GOAL #3   Title  Pt will decrease fascial restrictions in BUE to minimal amounts or less to improve ability to perform functional reaching tasks.     Time  4    Period  Weeks    Status  On-going      OT SHORT TERM GOAL #4   Title  Pt will improve BUE ROM to [facility] to improve ability to donn/doff bra and reaching overhead/behind back.     Time  4    Period  Weeks    Status  On-going      OT SHORT TERM GOAL #5   Title  Pt will improve BUE strength to 4/5 to improve ability to perform caregiving tasks for her Mother.     Time  4    Period  Weeks    Status  On-going               Plan - 06/05/18 1259    Clinical Impression Statement  A:  Patient with limited P/ROM and A/ROM above 90 degrees in supine.  Initiated manual therapy for decreasing fascial restrictions that are limiting mobility this  date, with left shoulder exhibiitng greater restrictions than left arm.  begina aa/rom in supine this date.      Plan  P:  Continue to provide manual therapy to decrease pain and restrictions in order to improve pain free mobility needed for ADL completion.  Increase to 12  repetitions with dowel rod exercises in supine and add aa/rom exercises in seated, as well as therapy ball stretches.        Patient will benefit from skilled therapeutic intervention in order to improve the following deficits and impairments:  Decreased activity tolerance, Decreased strength, Impaired flexibility, Decreased range of motion, Pain, Increased fascial restrictions, Impaired UE functional use  Visit Diagnosis: Chronic left shoulder pain  Chronic right shoulder pain  Stiffness of left shoulder, not elsewhere classified  Stiffness of right shoulder, not elsewhere classified    Problem List Patient Active Problem List   Diagnosis Date Noted  . Bilateral lower extremity edema 02/14/2018  . Essential hypertension 02/14/2018  . Hyperlipidemia 02/14/2018  . Family history of heart disease 02/14/2018  . Onychomycosis 08/26/2016  . OA (osteoarthritis) of knee 12/13/2014    Vangie Bicker, [place], OTR/L [phone]  06/05/2018, 1:05 PM  [place] [address] [place], [place], [address] Phone: [phone]   Fax:  [phone]  Name: [name] MRN: [id number] Date of Birth: 07-04-1941

## 2018-06-07 ENCOUNTER — Encounter (HOSPITAL_COMMUNITY): Payer: Self-pay | Admitting: Occupational Therapy

## 2018-06-07 ENCOUNTER — Encounter (HOSPITAL_COMMUNITY): Payer: 59

## 2018-06-07 ENCOUNTER — Ambulatory Visit (HOSPITAL_COMMUNITY): Payer: Medicare Other | Attending: Student | Admitting: Occupational Therapy

## 2018-06-07 DIAGNOSIS — M25612 Stiffness of left shoulder, not elsewhere classified: Secondary | ICD-10-CM | POA: Insufficient documentation

## 2018-06-07 DIAGNOSIS — R29898 Other symptoms and signs involving the musculoskeletal system: Secondary | ICD-10-CM | POA: Insufficient documentation

## 2018-06-07 DIAGNOSIS — G8929 Other chronic pain: Secondary | ICD-10-CM | POA: Diagnosis not present

## 2018-06-07 DIAGNOSIS — M25511 Pain in right shoulder: Secondary | ICD-10-CM | POA: Insufficient documentation

## 2018-06-07 DIAGNOSIS — M25512 Pain in left shoulder: Secondary | ICD-10-CM | POA: Insufficient documentation

## 2018-06-07 DIAGNOSIS — M25611 Stiffness of right shoulder, not elsewhere classified: Secondary | ICD-10-CM | POA: Diagnosis not present

## 2018-06-07 NOTE — Therapy (Signed)
[place], [place], [address] Phone: [phone]   Fax:  [phone]  Occupational Therapy Treatment  Patient Details  Name: [name] MRN: [id number] Date of Birth: [birth year] No data recorded  Encounter Date: 06/07/2018  OT End of Session - 06/07/18 1122    Visit Number  3    Number of Visits  8    Date for OT Re-Evaluation  07/01/18    Authorization Type  1) Medicare A & B 2) Generic [employer]    Authorization Time Period  No visit limit, based on medical necessity    OT Start Time  [phone]    OT Stop Time  1030    OT Time Calculation (min)  39 min    Activity Tolerance  Patient tolerated treatment well    Behavior During Therapy  [facility] for tasks assessed/performed       Past Medical History:  Diagnosis Date  . Arthritis   . Bladder spasms    "spastic bladder" wears pads  . Cancer ([place])    Skin cancer -scalp and eyebrow"basal cell"  . Complication of anesthesia    woke up too early from a surgery  . GERD (gastroesophageal reflux disease)    occ. frequent "hiccoughs" after "eating, indigestion"  . Hyperlipemia   . Hypertension   . Hypothyroidism   . Trigger finger, left    alternate fingers affected. -Occ. shooting pain.  . Wears glasses     Past Surgical History:  Procedure Laterality Date  . ABDOMINAL HYSTERECTOMY  1989  . APPENDECTOMY    . CARDIAC CATHETERIZATION  2006   normal-done for work up pre hip surgeries  . CARPAL TUNNEL RELEASE Left   . CHOLECYSTECTOMY  1997  . JOINT REPLACEMENT     BTHA  . POLYPECTOMY Bilateral 06/26/2013   Procedure: BILATERAL EXCISION OF NASAL POLYPS ;  Surgeon: Ascencion Dike, MD;  Location: [place];  Service: ENT;  Laterality: Bilateral;  . SEPTOPLASTY N/A 06/26/2013   Procedure: AND SEPTOPLASTY;  Surgeon: Ascencion Dike, MD;  Location: [place];  Service: ENT;  Laterality: N/A;  . THYROIDECTOMY  1994  . TONSILLECTOMY    . TOTAL HIP  ARTHROPLASTY  2006   right  . TOTAL HIP ARTHROPLASTY  2006   left  . TOTAL KNEE ARTHROPLASTY Right 12/13/2014   Procedure: RIGHT TOTAL KNEE ARTHROPLASTY;  Surgeon: Gaynelle Arabian, MD;  [facility];  Service: Orthopedics;  Laterality: Right;    There were no vitals filed for this visit.  Subjective Assessment - 06/07/18 1012    Subjective   S: I've had a bad morning today.     Currently in Pain?  No/denies                   OT Treatments/Exercises (OP) - 06/07/18 1012      Exercises   Exercises  Shoulder      Shoulder Exercises: Supine   Protraction  PROM;5 reps;AAROM;12 reps    Horizontal ABduction  PROM;5 reps;AAROM;12 reps    External Rotation  PROM;5 reps;AAROM;12 reps    Internal Rotation  PROM;5 reps;AAROM;12 reps    Flexion  PROM;5 reps;AAROM;12 reps    ABduction  PROM;5 reps;AAROM;12 reps      Shoulder Exercises: Seated   Protraction  AAROM;10 reps  (Pended)     Horizontal ABduction  AAROM;10 reps  (Pended)     External Rotation  AAROM;10 reps  (  Pended)     Internal Rotation  AAROM;10 reps  (Pended)     Flexion  AAROM;10 reps  (Pended)       Manual Therapy   Manual Therapy  Myofascial release    Manual therapy comments  Manual complete separate than rest of tx    Myofascial Release  myofascial release and manual stretching to bilateral upper arm, scapular, shoulder regions, and associated areas to decrease pain and restrictions and improve pain free mobility needed for improved independence with ADLs               OT Short Term Goals - 06/05/18 1302      OT SHORT TERM GOAL #1   Title  Pt will be provided with and independent in HEP to improve mobility required for ADL completion using BUE.     Time  4    Period  Weeks    Status  On-going      OT SHORT TERM GOAL #2   Title  Pt will decrease pain in BUE to 3/10 or less to improve ability to perform bathing tasks-washing behind back, washing hair, etc.     Time  4    Period  Weeks     Status  On-going      OT SHORT TERM GOAL #3   Title  Pt will decrease fascial restrictions in BUE to minimal amounts or less to improve ability to perform functional reaching tasks.     Time  4    Period  Weeks    Status  On-going      OT SHORT TERM GOAL #4   Title  Pt will improve BUE ROM to [facility] to improve ability to donn/doff bra and reaching overhead/behind back.     Time  4    Period  Weeks    Status  On-going      OT SHORT TERM GOAL #5   Title  Pt will improve BUE strength to 4/5 to improve ability to perform caregiving tasks for her Mother.     Time  4    Period  Weeks    Status  On-going               Plan - 06/07/18 1122    Clinical Impression Statement  A: Pt reports soreness since last session, no pain this am. RUE more than LUE. Continued with manual therapy to address fascial restrictions causing increased pain and limiting ROM. Pt able to tolerate increased P/ROM and performing AA/ROM at approximately 60% range. Added AA/ROM in sitting today with exception of abduction, pt with improvement in tolerance. Verbal cuing for form and technique.     Plan  P: Continue with manual therapy to decrease pain and fascial restrictions. Add abduction AA/ROM in sitting. Attempt pulleys if time permits       Patient will benefit from skilled therapeutic intervention in order to improve the following deficits and impairments:  Decreased activity tolerance, Decreased strength, Impaired flexibility, Decreased range of motion, Pain, Increased fascial restrictions, Impaired UE functional use  Visit Diagnosis: Chronic left shoulder pain  Chronic right shoulder pain  Stiffness of left shoulder, not elsewhere classified  Stiffness of right shoulder, not elsewhere classified  Other symptoms and signs involving the musculoskeletal system    Problem List Patient Active Problem List   Diagnosis Date Noted  . Bilateral lower extremity edema 02/14/2018  . Essential hypertension  02/14/2018  . Hyperlipidemia 02/14/2018  . Family history of heart disease  02/14/2018  . Onychomycosis 08/26/2016  . OA (osteoarthritis) of knee 12/13/2014   Guadelupe Sabin, OTR/L  [phone] 06/07/2018, 11:26 AM  [place], [place], [address] Phone: [phone]   Fax:  [phone]  Name: [name] MRN: [id number] Date of Birth: 1940-11-01

## 2018-06-09 ENCOUNTER — Encounter (HOSPITAL_COMMUNITY): Payer: 59

## 2018-06-14 ENCOUNTER — Encounter (HOSPITAL_COMMUNITY): Payer: 59

## 2018-06-14 ENCOUNTER — Ambulatory Visit (HOSPITAL_COMMUNITY): Payer: Medicare Other

## 2018-06-14 ENCOUNTER — Encounter (HOSPITAL_COMMUNITY): Payer: Self-pay

## 2018-06-14 DIAGNOSIS — M25611 Stiffness of right shoulder, not elsewhere classified: Secondary | ICD-10-CM

## 2018-06-14 DIAGNOSIS — M25512 Pain in left shoulder: Secondary | ICD-10-CM | POA: Diagnosis not present

## 2018-06-14 DIAGNOSIS — G8929 Other chronic pain: Secondary | ICD-10-CM | POA: Diagnosis not present

## 2018-06-14 DIAGNOSIS — M25511 Pain in right shoulder: Secondary | ICD-10-CM

## 2018-06-14 DIAGNOSIS — R29898 Other symptoms and signs involving the musculoskeletal system: Secondary | ICD-10-CM | POA: Diagnosis not present

## 2018-06-14 DIAGNOSIS — M25612 Stiffness of left shoulder, not elsewhere classified: Secondary | ICD-10-CM

## 2018-06-14 NOTE — Therapy (Signed)
[place], [place], [address] Phone: [phone]   Fax:  [phone]  Occupational Therapy Treatment  Patient Details  Name: [name] MRN: [id number] Date of Birth: 06/13/1941 Referring Provider (OT): Dr. Esmond Plants MD   Encounter Date: 06/14/2018  OT End of Session - 06/14/18 1237    Visit Number  4    Number of Visits  8    Date for OT Re-Evaluation  07/01/18    Authorization Type  1) Medicare A & B 2) Generic [employer]    Authorization Time Period  No visit limit, based on medical necessity    OT Start Time  1040   Pt arrived late   OT Stop Time  1115    OT Time Calculation (min)  35 min    Activity Tolerance  Patient tolerated treatment well    Behavior During Therapy  [facility] for tasks assessed/performed       Past Medical History:  Diagnosis Date  . Arthritis   . Bladder spasms    "spastic bladder" wears pads  . Cancer ([place])    Skin cancer -scalp and eyebrow"basal cell"  . Complication of anesthesia    woke up too early from a surgery  . GERD (gastroesophageal reflux disease)    occ. frequent "hiccoughs" after "eating, indigestion"  . Hyperlipemia   . Hypertension   . Hypothyroidism   . Trigger finger, left    alternate fingers affected. -Occ. shooting pain.  . Wears glasses     Past Surgical History:  Procedure Laterality Date  . ABDOMINAL HYSTERECTOMY  1989  . APPENDECTOMY    . CARDIAC CATHETERIZATION  2006   normal-done for work up pre hip surgeries  . CARPAL TUNNEL RELEASE Left   . CHOLECYSTECTOMY  1997  . JOINT REPLACEMENT     BTHA  . POLYPECTOMY Bilateral 06/26/2013   Procedure: BILATERAL EXCISION OF NASAL POLYPS ;  Surgeon: Ascencion Dike, MD;  Location: [place];  Service: ENT;  Laterality: Bilateral;  . SEPTOPLASTY N/A 06/26/2013   Procedure: AND SEPTOPLASTY;  Surgeon: Ascencion Dike, MD;  Location: [place];  Service: ENT;  Laterality: N/A;  .  THYROIDECTOMY  1994  . TONSILLECTOMY    . TOTAL HIP ARTHROPLASTY  2006   right  . TOTAL HIP ARTHROPLASTY  2006   left  . TOTAL KNEE ARTHROPLASTY Right 12/13/2014   Procedure: RIGHT TOTAL KNEE ARTHROPLASTY;  Surgeon: Gaynelle Arabian, MD;  [facility];  Service: Orthopedics;  Laterality: Right;    There were no vitals filed for this visit.  Subjective Assessment - 06/14/18 1041    Subjective   S: My right arm was a little sore from last session. I haven't been able to do any of my exercises.     Currently in Pain?  Yes    Pain Score  2     Pain Location  Shoulder    Pain Orientation  Right    Pain Descriptors / Indicators  Aching    Pain Type  Chronic pain    Pain Radiating Towards  upper arm    Pain Onset  More than a month ago    Pain Frequency  Intermittent    Aggravating Factors   with movement above 90    Pain Relieving Factors  rest    Effect of Pain on Daily Activities  minimal          [facility] OT  Assessment - 06/14/18 1103      Assessment   Medical Diagnosis  bilateral shoulder pain    Referring Provider (OT)  Dr. Esmond Plants MD      Precautions   Precautions  None               OT Treatments/Exercises (OP) - 06/14/18 1104      Exercises   Exercises  Shoulder      Shoulder Exercises: Supine   Protraction  PROM;5 reps;Both    Horizontal ABduction  PROM;5 reps;Both    External Rotation  PROM;5 reps;Both    Internal Rotation  PROM;5 reps;Both    Flexion  PROM;5 reps;Both    ABduction  PROM;5 reps;Both      Shoulder Exercises: Seated   Protraction  AAROM;10 reps    Horizontal ABduction  AAROM;10 reps    External Rotation  AAROM;10 reps;Both    Internal Rotation  AAROM;10 reps;Both    Flexion  AAROM;10 reps      Shoulder Exercises: Pulleys   Flexion  1 minute      Manual Therapy   Manual Therapy  Myofascial release    Manual therapy comments  Manual complete separate than rest of tx    Myofascial Release  myofascial release and manual  stretching to bilateral upper arm, scapular, shoulder regions, and associated areas to decrease pain and restrictions and improve pain free mobility needed for improved independence with ADLs               OT Short Term Goals - 06/05/18 1302      OT SHORT TERM GOAL #1   Title  Pt will be provided with and independent in HEP to improve mobility required for ADL completion using BUE.     Time  4    Period  Weeks    Status  On-going      OT SHORT TERM GOAL #2   Title  Pt will decrease pain in BUE to 3/10 or less to improve ability to perform bathing tasks-washing behind back, washing hair, etc.     Time  4    Period  Weeks    Status  On-going      OT SHORT TERM GOAL #3   Title  Pt will decrease fascial restrictions in BUE to minimal amounts or less to improve ability to perform functional reaching tasks.     Time  4    Period  Weeks    Status  On-going      OT SHORT TERM GOAL #4   Title  Pt will improve BUE ROM to [facility] to improve ability to donn/doff bra and reaching overhead/behind back.     Time  4    Period  Weeks    Status  On-going      OT SHORT TERM GOAL #5   Title  Pt will improve BUE strength to 4/5 to improve ability to perform caregiving tasks for her Mother.     Time  4    Period  Weeks    Status  On-going               Plan - 06/14/18 1238    Clinical Impression Statement  A: patient presented with min fascial restrictions in right anterior shoulder region with reports of tenderness during manual techniques. Muscle guarding and muscle tightness noted during passive stretching. Min fascial restrictions on the left upper trapezius region. pt was able to tolerate passive stretching to LUE better  than right side. Added pulleys in flexion and all AA/ROM was completed seated due to time constraint. VC for form and technique.     Plan  P: Complete manual therapy to Bilateral UEs as needed. Complete pulleys in abduction (each arm individually may be more  comfortable). Add wall wash.     Consulted and Agree with Plan of Care  Patient       Patient will benefit from skilled therapeutic intervention in order to improve the following deficits and impairments:  Decreased activity tolerance, Decreased strength, Impaired flexibility, Decreased range of motion, Pain, Increased fascial restrictions, Impaired UE functional use  Visit Diagnosis: Chronic left shoulder pain  Chronic right shoulder pain  Stiffness of left shoulder, not elsewhere classified  Stiffness of right shoulder, not elsewhere classified  Other symptoms and signs involving the musculoskeletal system    Problem List Patient Active Problem List   Diagnosis Date Noted  . Bilateral lower extremity edema 02/14/2018  . Essential hypertension 02/14/2018  . Hyperlipidemia 02/14/2018  . Family history of heart disease 02/14/2018  . Onychomycosis 08/26/2016  . OA (osteoarthritis) of knee 12/13/2014   Ailene Ravel, OTR/L,CBIS  [phone]  06/14/2018, 12:41 PM  [facility] [address] [place], [place], [address] Phone: [phone]   Fax:  [phone]  Name: ORLA ESTRIN MRN: [id number] Date of Birth: [birth year]

## 2018-06-15 ENCOUNTER — Encounter (INDEPENDENT_AMBULATORY_CARE_PROVIDER_SITE_OTHER): Payer: Self-pay | Admitting: Orthopedic Surgery

## 2018-06-15 ENCOUNTER — Ambulatory Visit (INDEPENDENT_AMBULATORY_CARE_PROVIDER_SITE_OTHER): Payer: Medicare Other | Admitting: Orthopedic Surgery

## 2018-06-15 VITALS — Ht 62.0 in | Wt 205.0 lb

## 2018-06-15 DIAGNOSIS — B351 Tinea unguium: Secondary | ICD-10-CM

## 2018-06-15 DIAGNOSIS — L97521 Non-pressure chronic ulcer of other part of left foot limited to breakdown of skin: Secondary | ICD-10-CM | POA: Diagnosis not present

## 2018-06-15 NOTE — Progress Notes (Signed)
Office Visit Note   Patient: [name]           Date of Birth: [birth year]           MRN: [id number] Visit Date: 06/15/2018              Requested by: Sharilyn Sites, [place], [place] [address] PCP: Sharilyn Sites, MD  Chief Complaint  Patient presents with  . Right Foot - Follow-up  . Left Foot - Follow-up      HPI: Patient is a 77 year old woman who presents complaining of painful ulcers on the plantar aspect left foot as well as painful onychomycotic nails x10.  Assessment & Plan: Visit Diagnoses:  1. Onychomycosis   2. Non-pressure chronic ulcer of other part of left foot limited to breakdown of skin (HCC)     Plan: Nails were trimmed x10 ulcer debrided x1.  Patient will follow-up when she brings her mother back for follow-up appointment.  Follow-Up Instructions: Return in about 3 months (around 09/15/2018).   Ortho Exam  Patient is alert, oriented, no adenopathy, well-dressed, normal affect, normal respiratory effort. Examination patient has difficulty getting from a sitting to a standing position.  She has a normal gait.  Semination of both legs she does have venous stasis swelling she states she occasionally uses Lasix.  There are no ulcers.  She does have a hypertrophic callus in the plantar aspect the left foot using a 10 blade knife this was pared without complications.  There is no open wound or ulcer.  Patient does have thickened discolored onychomycotic nails x10 she is unable to safely trim the nails on her own and the nails are trimmed R51 without complications.  Imaging: No results found. No images are attached to the encounter.  Labs: No results found for: HGBA1C, ESRSEDRATE, CRP, LABURIC, REPTSTATUS, GRAMSTAIN, CULT, LABORGA   Lab Results  Component Value Date   ALBUMIN 3.9 12/06/2014    Body mass index is 37.49 kg/m.  Orders:  No orders of the defined types were placed in this encounter.  No orders of the defined types  were placed in this encounter.    Procedures: No procedures performed  Clinical Data: No additional findings.  ROS:  All other systems negative, except as noted in the HPI. Review of Systems  Objective: Vital Signs: Ht 5\' 2"  (1.575 m)   Wt 205 lb (93 kg)   BMI 37.49 kg/m   Specialty Comments:  No specialty comments available.  PMFS History: Patient Active Problem List   Diagnosis Date Noted  . Bilateral lower extremity edema 02/14/2018  . Essential hypertension 02/14/2018  . Hyperlipidemia 02/14/2018  . Family history of heart disease 02/14/2018  . Onychomycosis 08/26/2016  . OA (osteoarthritis) of knee 12/13/2014   Past Medical History:  Diagnosis Date  . Arthritis   . Bladder spasms    "spastic bladder" wears pads  . Cancer ([place])    Skin cancer -scalp and eyebrow"basal cell"  . Complication of anesthesia    woke up too early from a surgery  . GERD (gastroesophageal reflux disease)    occ. frequent "hiccoughs" after "eating, indigestion"  . Hyperlipemia   . Hypertension   . Hypothyroidism   . Trigger finger, left    alternate fingers affected. -Occ. shooting pain.  . Wears glasses     History reviewed. No pertinent family history.  Past Surgical History:  Procedure Laterality Date  . ABDOMINAL HYSTERECTOMY  1989  . APPENDECTOMY    .  CARDIAC CATHETERIZATION  2006   normal-done for work up pre hip surgeries  . CARPAL TUNNEL RELEASE Left   . CHOLECYSTECTOMY  1997  . JOINT REPLACEMENT     BTHA  . POLYPECTOMY Bilateral 06/26/2013   Procedure: BILATERAL EXCISION OF NASAL POLYPS ;  Surgeon: Ascencion Dike, MD;  Location: [place];  Service: ENT;  Laterality: Bilateral;  . SEPTOPLASTY N/A 06/26/2013   Procedure: AND SEPTOPLASTY;  Surgeon: Ascencion Dike, MD;  Location: [facility];  Service: ENT;  Laterality: N/A;  . THYROIDECTOMY  1994  . TONSILLECTOMY    . TOTAL HIP ARTHROPLASTY  2006   right  . TOTAL HIP ARTHROPLASTY  2006    left  . TOTAL KNEE ARTHROPLASTY Right 12/13/2014   Procedure: RIGHT TOTAL KNEE ARTHROPLASTY;  Surgeon: Gaynelle Arabian, MD;  [facility];  Service: Orthopedics;  Laterality: Right;   Social History   Occupational History  . Not on file  Tobacco Use  . Smoking status: Never Smoker  . Smokeless tobacco: Never Used  Substance and Sexual Activity  . Alcohol use: No  . Drug use: No  . Sexual activity: Not Currently

## 2018-06-16 ENCOUNTER — Encounter (HOSPITAL_COMMUNITY): Payer: Self-pay | Admitting: Occupational Therapy

## 2018-06-16 ENCOUNTER — Encounter (HOSPITAL_COMMUNITY): Payer: 59

## 2018-06-16 ENCOUNTER — Ambulatory Visit (HOSPITAL_COMMUNITY): Payer: Medicare Other | Admitting: Occupational Therapy

## 2018-06-16 DIAGNOSIS — M25611 Stiffness of right shoulder, not elsewhere classified: Secondary | ICD-10-CM

## 2018-06-16 DIAGNOSIS — M25511 Pain in right shoulder: Secondary | ICD-10-CM | POA: Diagnosis not present

## 2018-06-16 DIAGNOSIS — M25612 Stiffness of left shoulder, not elsewhere classified: Secondary | ICD-10-CM

## 2018-06-16 DIAGNOSIS — M25512 Pain in left shoulder: Principal | ICD-10-CM

## 2018-06-16 DIAGNOSIS — R29898 Other symptoms and signs involving the musculoskeletal system: Secondary | ICD-10-CM | POA: Diagnosis not present

## 2018-06-16 DIAGNOSIS — G8929 Other chronic pain: Secondary | ICD-10-CM

## 2018-06-16 NOTE — Therapy (Signed)
[place], [place], [address] Phone: [phone]   Fax:  [phone]  Occupational Therapy Treatment  Patient Details  Name: [name] MRN: [id number] Date of Birth: 05/03/1941 Referring Provider (OT): Dr. Esmond Plants MD   Encounter Date: 06/16/2018  OT End of Session - 06/16/18 1145    Visit Number  5    Number of Visits  8    Date for OT Re-Evaluation  07/01/18    Authorization Type  1) Medicare A & B 2) Generic [employer]    Authorization Time Period  No visit limit, based on medical necessity    OT Start Time  1031    OT Stop Time  1116    OT Time Calculation (min)  45 min    Activity Tolerance  Patient tolerated treatment well    Behavior During Therapy  [facility] for tasks assessed/performed       Past Medical History:  Diagnosis Date  . Arthritis   . Bladder spasms    "spastic bladder" wears pads  . Cancer ([place])    Skin cancer -scalp and eyebrow"basal cell"  . Complication of anesthesia    woke up too early from a surgery  . GERD (gastroesophageal reflux disease)    occ. frequent "hiccoughs" after "eating, indigestion"  . Hyperlipemia   . Hypertension   . Hypothyroidism   . Trigger finger, left    alternate fingers affected. -Occ. shooting pain.  . Wears glasses     Past Surgical History:  Procedure Laterality Date  . ABDOMINAL HYSTERECTOMY  1989  . APPENDECTOMY    . CARDIAC CATHETERIZATION  2006   normal-done for work up pre hip surgeries  . CARPAL TUNNEL RELEASE Left   . CHOLECYSTECTOMY  1997  . JOINT REPLACEMENT     BTHA  . POLYPECTOMY Bilateral 06/26/2013   Procedure: BILATERAL EXCISION OF NASAL POLYPS ;  Surgeon: Ascencion Dike, MD;  Location: [place];  Service: ENT;  Laterality: Bilateral;  . SEPTOPLASTY N/A 06/26/2013   Procedure: AND SEPTOPLASTY;  Surgeon: Ascencion Dike, MD;  Location: [place];  Service: ENT;  Laterality: N/A;  . THYROIDECTOMY  1994   . TONSILLECTOMY    . TOTAL HIP ARTHROPLASTY  2006   right  . TOTAL HIP ARTHROPLASTY  2006   left  . TOTAL KNEE ARTHROPLASTY Right 12/13/2014   Procedure: RIGHT TOTAL KNEE ARTHROPLASTY;  Surgeon: Gaynelle Arabian, MD;  Location: [facility];  Service: Orthopedics;  Laterality: Right;    There were no vitals filed for this visit.  Subjective Assessment - 06/16/18 1143    Subjective   S: I haven't been doing my exercises, but I'm trying to use my arms more.     Currently in Pain?  Yes    Pain Score  3     Pain Location  Shoulder    Pain Orientation  Right;Left    Pain Descriptors / Indicators  Sore    Pain Type  Chronic pain    Pain Radiating Towards  Elbow    Pain Onset  More than a month ago    Pain Frequency  Intermittent    Aggravating Factors   Use of arms, lifting    Pain Relieving Factors  Medication, heat     Effect of Pain on Daily Activities  Mod effect on daily activities.     Multiple Pain Sites  No  [facility] OT Assessment - 06/16/18 1044      Assessment   Medical Diagnosis  bilateral shoulder pain      Precautions   Precautions  None               OT Treatments/Exercises (OP) - 06/16/18 1044      Exercises   Exercises  Shoulder      Shoulder Exercises: Supine   Protraction  PROM;5 reps;Both;AAROM;10 reps    Horizontal ABduction  PROM;5 reps;Both;AAROM;10 reps    External Rotation  PROM;5 reps;Both;AAROM;10 reps    Internal Rotation  PROM;5 reps;Both;AAROM;10 reps    Flexion  PROM;5 reps;Both;AAROM;10 reps    ABduction  PROM;5 reps;Both;AAROM;10 reps      Shoulder Exercises: Seated   Protraction  AAROM;10 reps;Both    Horizontal ABduction  AAROM;10 reps;Both    Flexion  AAROM;10 reps;Both      Shoulder Exercises: Pulleys   Flexion  1 minute;Other (comment)   Both shoulders; 1' each      Shoulder Exercises: ROM/Strengthening   Wall Wash  Bilateral wall wash 1' each side, mod fatigue noted      Manual Therapy   Manual Therapy  Myofascial  release    Manual therapy comments  Manual complete separate than rest of tx    Myofascial Release  Myofascial release and manual stretching to bilateral upper arm, scapular, shoulder regions, and associated areas to decrease pain and restrictions and improve pain free mobility needed for improved independence with ADLs               OT Short Term Goals - 06/05/18 1302      OT SHORT TERM GOAL #1   Title  Pt will be provided with and independent in HEP to improve mobility required for ADL completion using BUE.     Time  4    Period  Weeks    Status  On-going      OT SHORT TERM GOAL #2   Title  Pt will decrease pain in BUE to 3/10 or less to improve ability to perform bathing tasks-washing behind back, washing hair, etc.     Time  4    Period  Weeks    Status  On-going      OT SHORT TERM GOAL #3   Title  Pt will decrease fascial restrictions in BUE to minimal amounts or less to improve ability to perform functional reaching tasks.     Time  4    Period  Weeks    Status  On-going      OT SHORT TERM GOAL #4   Title  Pt will improve BUE ROM to [facility] to improve ability to donn/doff bra and reaching overhead/behind back.     Time  4    Period  Weeks    Status  On-going      OT SHORT TERM GOAL #5   Title  Pt will improve BUE strength to 4/5 to improve ability to perform caregiving tasks for her Mother.     Time  4    Period  Weeks    Status  On-going               Plan - 06/16/18 1114    Clinical Impression Statement  A: Pt with guarding during P/ROM. Continued with A/AROM this session. Added wall wash activity with mod fatigue noted after task completion. Verbal cues for form and technique.     Plan  P: Continue with  A/AROM next in seated. Complete pulleys in abduction with one arm at a time if that is more comfortable for pt. Add thumb tacks next session.     Consulted and Agree with Plan of Care  Patient       Patient will benefit from skilled therapeutic  intervention in order to improve the following deficits and impairments:  Decreased activity tolerance, Decreased strength, Impaired flexibility, Decreased range of motion, Pain, Increased fascial restrictions, Impaired UE functional use  Visit Diagnosis: Chronic left shoulder pain  Chronic right shoulder pain  Stiffness of left shoulder, not elsewhere classified  Stiffness of right shoulder, not elsewhere classified  Other symptoms and signs involving the musculoskeletal system    Problem List Patient Active Problem List   Diagnosis Date Noted  . Bilateral lower extremity edema 02/14/2018  . Essential hypertension 02/14/2018  . Hyperlipidemia 02/14/2018  . Family history of heart disease 02/14/2018  . Onychomycosis 08/26/2016  . OA (osteoarthritis) of knee 12/13/2014    Toney Rakes, OT student  06/16/2018, 11:50 AM  [place], [place], [address] Phone: [phone]   Fax:  [phone]  Name: [name] MRN: [id number] Date of Birth: December 15, 1940

## 2018-06-21 ENCOUNTER — Ambulatory Visit (HOSPITAL_COMMUNITY): Payer: Medicare Other

## 2018-06-21 ENCOUNTER — Encounter (HOSPITAL_COMMUNITY): Payer: Self-pay

## 2018-06-21 DIAGNOSIS — M25612 Stiffness of left shoulder, not elsewhere classified: Secondary | ICD-10-CM | POA: Diagnosis not present

## 2018-06-21 DIAGNOSIS — M25611 Stiffness of right shoulder, not elsewhere classified: Secondary | ICD-10-CM

## 2018-06-21 DIAGNOSIS — M25512 Pain in left shoulder: Secondary | ICD-10-CM | POA: Diagnosis not present

## 2018-06-21 DIAGNOSIS — M25511 Pain in right shoulder: Secondary | ICD-10-CM

## 2018-06-21 DIAGNOSIS — R29898 Other symptoms and signs involving the musculoskeletal system: Secondary | ICD-10-CM

## 2018-06-21 DIAGNOSIS — G8929 Other chronic pain: Secondary | ICD-10-CM | POA: Diagnosis not present

## 2018-06-21 NOTE — Therapy (Signed)
[place], [place], [address] Phone: [phone]   Fax:  [phone]  Occupational Therapy Treatment  Patient Details  Name: [name] MRN: [id number] Date of Birth: 12/10/1940 Referring Provider (OT): Dr. Esmond Plants MD   Encounter Date: 06/21/2018  OT End of Session - 06/21/18 1144    Visit Number  6    Number of Visits  8    Date for OT Re-Evaluation  07/01/18    Authorization Type  1) Medicare A & B 2) Generic [employer]    Authorization Time Period  No visit limit, based on medical necessity    OT Start Time  1042   Patient arrived late   OT Stop Time  1115    OT Time Calculation (min)  33 min    Activity Tolerance  Patient tolerated treatment well    Behavior During Therapy  [facility] for tasks assessed/performed       Past Medical History:  Diagnosis Date  . Arthritis   . Bladder spasms    "spastic bladder" wears pads  . Cancer ([place])    Skin cancer -scalp and eyebrow"basal cell"  . Complication of anesthesia    woke up too early from a surgery  . GERD (gastroesophageal reflux disease)    occ. frequent "hiccoughs" after "eating, indigestion"  . Hyperlipemia   . Hypertension   . Hypothyroidism   . Trigger finger, left    alternate fingers affected. -Occ. shooting pain.  . Wears glasses     Past Surgical History:  Procedure Laterality Date  . ABDOMINAL HYSTERECTOMY  1989  . APPENDECTOMY    . CARDIAC CATHETERIZATION  2006   normal-done for work up pre hip surgeries  . CARPAL TUNNEL RELEASE Left   . CHOLECYSTECTOMY  1997  . JOINT REPLACEMENT     BTHA  . POLYPECTOMY Bilateral 06/26/2013   Procedure: BILATERAL EXCISION OF NASAL POLYPS ;  Surgeon: Ascencion Dike, MD;  Location: [place];  Service: ENT;  Laterality: Bilateral;  . SEPTOPLASTY N/A 06/26/2013   Procedure: AND SEPTOPLASTY;  Surgeon: Ascencion Dike, MD;  Location: [place];  Service: ENT;  Laterality: N/A;   . THYROIDECTOMY  1994  . TONSILLECTOMY    . TOTAL HIP ARTHROPLASTY  2006   right  . TOTAL HIP ARTHROPLASTY  2006   left  . TOTAL KNEE ARTHROPLASTY Right 12/13/2014   Procedure: RIGHT TOTAL KNEE ARTHROPLASTY;  Surgeon: Gaynelle Arabian, MD;  Location[facility];  Service: Orthopedics;  Laterality: Right;    There were no vitals filed for this visit.  Subjective Assessment - 06/21/18 1100    Subjective   S: I haven't been doing my exercises.     Currently in Pain?  No/denies         [facility] OT Assessment - 06/21/18 1101      Assessment   Medical Diagnosis  bilateral shoulder pain      Precautions   Precautions  None               OT Treatments/Exercises (OP) - 06/21/18 1101      Exercises   Exercises  Shoulder      Shoulder Exercises: Supine   Protraction  PROM;5 reps;Both    Horizontal ABduction  PROM;5 reps;Both    External Rotation  PROM;5 reps;Both    Internal Rotation  PROM;5 reps;Both    Flexion  PROM;5 reps;Both    ABduction  PROM;5  reps;Both      Shoulder Exercises: Seated   Protraction  AAROM;10 reps;Both    Horizontal ABduction  AAROM;10 reps;Both    External Rotation  AAROM;10 reps;Both    Internal Rotation  AAROM;10 reps;Both    Flexion  AAROM;10 reps;Both      Shoulder Exercises: ROM/Strengthening   Other ROM/Strengthening Exercises  PVC pipe slide; 10X each arm      Manual Therapy   Manual Therapy  Myofascial release    Manual therapy comments  Manual complete separate than rest of tx    Myofascial Release  Myofascial release and manual stretching to bilateral upper arm, scapular, shoulder regions, and associated areas to decrease pain and restrictions and improve pain free mobility needed for improved independence with ADLs               OT Short Term Goals - 06/05/18 1302      OT SHORT TERM GOAL #1   Title  Pt will be provided with and independent in HEP to improve mobility required for ADL completion using BUE.     Time  4     Period  Weeks    Status  On-going      OT SHORT TERM GOAL #2   Title  Pt will decrease pain in BUE to 3/10 or less to improve ability to perform bathing tasks-washing behind back, washing hair, etc.     Time  4    Period  Weeks    Status  On-going      OT SHORT TERM GOAL #3   Title  Pt will decrease fascial restrictions in BUE to minimal amounts or less to improve ability to perform functional reaching tasks.     Time  4    Period  Weeks    Status  On-going      OT SHORT TERM GOAL #4   Title  Pt will improve BUE ROM to [facility] to improve ability to donn/doff bra and reaching overhead/behind back.     Time  4    Period  Weeks    Status  On-going      OT SHORT TERM GOAL #5   Title  Pt will improve BUE strength to 4/5 to improve ability to perform caregiving tasks for her Mother.     Time  4    Period  Weeks    Status  On-going               Plan - 06/21/18 1145    Clinical Impression Statement  A: Manual therapy not completed due to time constraint and no pain upon arrival. Eliminated supine AA/ROM in order to complete seated. Patient required min VC for form and technique.     Plan  P: Resume wall wash and pulley exercises. Add thumb tacks. Complete Myofascial release if needed.     Consulted and Agree with Plan of Care  Patient       Patient will benefit from skilled therapeutic intervention in order to improve the following deficits and impairments:  Decreased activity tolerance, Decreased strength, Impaired flexibility, Decreased range of motion, Pain, Increased fascial restrictions, Impaired UE functional use  Visit Diagnosis: Chronic left shoulder pain  Chronic right shoulder pain  Stiffness of left shoulder, not elsewhere classified  Stiffness of right shoulder, not elsewhere classified  Other symptoms and signs involving the musculoskeletal system    Problem List Patient Active Problem List   Diagnosis Date Noted  . Bilateral lower extremity edema  02/14/2018  . Essential hypertension 02/14/2018  . Hyperlipidemia 02/14/2018  . Family history of heart disease 02/14/2018  . Onychomycosis 08/26/2016  . OA (osteoarthritis) of knee 12/13/2014   Ailene Ravel, OTR/L,CBIS  [phone]  06/21/2018, 11:49 AM  [facility] [address] [place], [place], [address] Phone: [phone]   Fax:  [phone]  Name: [name] MRN: [id number] Date of Birth: [birth year]

## 2018-06-23 ENCOUNTER — Encounter (HOSPITAL_COMMUNITY): Payer: Self-pay

## 2018-06-23 ENCOUNTER — Ambulatory Visit (HOSPITAL_COMMUNITY): Payer: Medicare Other

## 2018-06-23 DIAGNOSIS — R29898 Other symptoms and signs involving the musculoskeletal system: Secondary | ICD-10-CM | POA: Diagnosis not present

## 2018-06-23 DIAGNOSIS — M25512 Pain in left shoulder: Principal | ICD-10-CM

## 2018-06-23 DIAGNOSIS — M25511 Pain in right shoulder: Secondary | ICD-10-CM

## 2018-06-23 DIAGNOSIS — G8929 Other chronic pain: Secondary | ICD-10-CM | POA: Diagnosis not present

## 2018-06-23 DIAGNOSIS — M25612 Stiffness of left shoulder, not elsewhere classified: Secondary | ICD-10-CM | POA: Diagnosis not present

## 2018-06-23 DIAGNOSIS — M25611 Stiffness of right shoulder, not elsewhere classified: Secondary | ICD-10-CM | POA: Diagnosis not present

## 2018-06-23 NOTE — Therapy (Signed)
[place], [place], [address] Phone: [phone]   Fax:  [phone]  Occupational Therapy Treatment  Patient Details  Name: [name] MRN: [id number] Date of Birth: 08-28-1941 Referring Provider (OT): Dr. Esmond Plants MD   Encounter Date: 06/23/2018  OT End of Session - 06/23/18 1234    Visit Number  7    Number of Visits  8    Date for OT Re-Evaluation  07/01/18    Authorization Type  1) Medicare A & B 2) Generic [employer]    Authorization Time Period  No visit limit, based on medical necessity    OT Start Time  1042   Pt arrive late   OT Stop Time  1115    OT Time Calculation (min)  33 min    Activity Tolerance  Patient tolerated treatment well    Behavior During Therapy  [facility] for tasks assessed/performed       Past Medical History:  Diagnosis Date  . Arthritis   . Bladder spasms    "spastic bladder" wears pads  . Cancer ([place])    Skin cancer -scalp and eyebrow"basal cell"  . Complication of anesthesia    woke up too early from a surgery  . GERD (gastroesophageal reflux disease)    occ. frequent "hiccoughs" after "eating, indigestion"  . Hyperlipemia   . Hypertension   . Hypothyroidism   . Trigger finger, left    alternate fingers affected. -Occ. shooting pain.  . Wears glasses     Past Surgical History:  Procedure Laterality Date  . ABDOMINAL HYSTERECTOMY  1989  . APPENDECTOMY    . CARDIAC CATHETERIZATION  2006   normal-done for work up pre hip surgeries  . CARPAL TUNNEL RELEASE Left   . CHOLECYSTECTOMY  1997  . JOINT REPLACEMENT     BTHA  . POLYPECTOMY Bilateral 06/26/2013   Procedure: BILATERAL EXCISION OF NASAL POLYPS ;  Surgeon: Ascencion Dike, MD;  Location: [place];  Service: ENT;  Laterality: Bilateral;  . SEPTOPLASTY N/A 06/26/2013   Procedure: AND SEPTOPLASTY;  Surgeon: Ascencion Dike, MD;  Location: [place];  Service: ENT;  Laterality: N/A;  .  THYROIDECTOMY  1994  . TONSILLECTOMY    . TOTAL HIP ARTHROPLASTY  2006   right  . TOTAL HIP ARTHROPLASTY  2006   left  . TOTAL KNEE ARTHROPLASTY Right 12/13/2014   Procedure: RIGHT TOTAL KNEE ARTHROPLASTY;  Surgeon: Gaynelle Arabian, MD;  [facility];  Service: Orthopedics;  Laterality: Right;    There were no vitals filed for this visit.  Subjective Assessment - 06/23/18 1045    Subjective   S: I attempted my shoulder stretches yesterday.     Currently in Pain?  No/denies         [facility] OT Assessment - 06/23/18 1054      Assessment   Medical Diagnosis  bilateral shoulder pain      Precautions   Precautions  None               OT Treatments/Exercises (OP) - 06/23/18 1055      Exercises   Exercises  Shoulder      Shoulder Exercises: Supine   Protraction  PROM;5 reps;Both    Horizontal ABduction  PROM;5 reps;Both    External Rotation  PROM;5 reps;Both    Internal Rotation  PROM;5 reps;Both    Flexion  PROM;5 reps;Both    ABduction  PROM;5  reps;Both      Shoulder Exercises: Seated   Protraction  AAROM;12 reps    Horizontal ABduction  AAROM;12 reps;Both    Flexion  AAROM;12 reps;Both      Shoulder Exercises: Pulleys   Flexion  1 minute   standing     Shoulder Exercises: ROM/Strengthening   Wall Wash  bilateral wall wash 1'                OT Short Term Goals - 06/05/18 1302      OT SHORT TERM GOAL #1   Title  Pt will be provided with and independent in HEP to improve mobility required for ADL completion using BUE.     Time  4    Period  Weeks    Status  On-going      OT SHORT TERM GOAL #2   Title  Pt will decrease pain in BUE to 3/10 or less to improve ability to perform bathing tasks-washing behind back, washing hair, etc.     Time  4    Period  Weeks    Status  On-going      OT SHORT TERM GOAL #3   Title  Pt will decrease fascial restrictions in BUE to minimal amounts or less to improve ability to perform functional reaching tasks.      Time  4    Period  Weeks    Status  On-going      OT SHORT TERM GOAL #4   Title  Pt will improve BUE ROM to [facility] to improve ability to donn/doff bra and reaching overhead/behind back.     Time  4    Period  Weeks    Status  On-going      OT SHORT TERM GOAL #5   Title  Pt will improve BUE strength to 4/5 to improve ability to perform caregiving tasks for her Mother.     Time  4    Period  Weeks    Status  On-going               Plan - 06/23/18 1235    Clinical Impression Statement  A: No manual therapy completed this date due to time constraint and no pain level upon arrival. Patient progressed to 12 repetitions for AA/ROM. Pulleys completed standing in flexion for best form and technique. VC for form and technique were provided.     Plan  P: Add abduction/scaption with pulleys. Add thumb tacks.     Consulted and Agree with Plan of Care  Patient       Patient will benefit from skilled therapeutic intervention in order to improve the following deficits and impairments:  Decreased activity tolerance, Decreased strength, Impaired flexibility, Decreased range of motion, Pain, Increased fascial restrictions, Impaired UE functional use  Visit Diagnosis: Chronic left shoulder pain  Chronic right shoulder pain  Stiffness of left shoulder, not elsewhere classified  Stiffness of right shoulder, not elsewhere classified  Other symptoms and signs involving the musculoskeletal system    Problem List Patient Active Problem List   Diagnosis Date Noted  . Bilateral lower extremity edema 02/14/2018  . Essential hypertension 02/14/2018  . Hyperlipidemia 02/14/2018  . Family history of heart disease 02/14/2018  . Onychomycosis 08/26/2016  . OA (osteoarthritis) of knee 12/13/2014   Ailene Ravel, OTR/L,CBIS  [phone]  06/23/2018, 12:53 PM  [place] [address] [place], [place], [address] Phone: [phone]   Fax:   [phone]  Name: SULY VUKELICH MRN: [id number] Date of Birth: August 29, 1941

## 2018-06-28 ENCOUNTER — Encounter (HOSPITAL_COMMUNITY): Payer: Self-pay | Admitting: Occupational Therapy

## 2018-06-28 ENCOUNTER — Ambulatory Visit (HOSPITAL_COMMUNITY): Payer: Medicare Other | Admitting: Occupational Therapy

## 2018-06-28 DIAGNOSIS — G8929 Other chronic pain: Secondary | ICD-10-CM | POA: Diagnosis not present

## 2018-06-28 DIAGNOSIS — M25511 Pain in right shoulder: Secondary | ICD-10-CM | POA: Diagnosis not present

## 2018-06-28 DIAGNOSIS — R29898 Other symptoms and signs involving the musculoskeletal system: Secondary | ICD-10-CM | POA: Diagnosis not present

## 2018-06-28 DIAGNOSIS — M25512 Pain in left shoulder: Principal | ICD-10-CM

## 2018-06-28 DIAGNOSIS — M25612 Stiffness of left shoulder, not elsewhere classified: Secondary | ICD-10-CM | POA: Diagnosis not present

## 2018-06-28 DIAGNOSIS — M25611 Stiffness of right shoulder, not elsewhere classified: Secondary | ICD-10-CM | POA: Diagnosis not present

## 2018-06-28 NOTE — Therapy (Signed)
[place], [place], [address] Phone: [phone]   Fax:  [phone]  Occupational Therapy Treatment  Patient Details  Name: [name] MRN: [id number] Date of Birth: 1941/02/22 Referring Provider (OT): Dr. Esmond Plants MD   Encounter Date: 06/28/2018  OT End of Session - 06/28/18 1246    Visit Number  8    Number of Visits  8    Date for OT Re-Evaluation  07/01/18    Authorization Type  1) Medicare A & B 2) Generic [employer]    Authorization Time Period  No visit limit, based on medical necessity    OT Start Time  1128   Pt arrived late   OT Stop Time  1202    OT Time Calculation (min)  34 min    Activity Tolerance  Patient tolerated treatment well    Behavior During Therapy  [facility] for tasks assessed/performed       Past Medical History:  Diagnosis Date  . Arthritis   . Bladder spasms    "spastic bladder" wears pads  . Cancer ([place])    Skin cancer -scalp and eyebrow"basal cell"  . Complication of anesthesia    woke up too early from a surgery  . GERD (gastroesophageal reflux disease)    occ. frequent "hiccoughs" after "eating, indigestion"  . Hyperlipemia   . Hypertension   . Hypothyroidism   . Trigger finger, left    alternate fingers affected. -Occ. shooting pain.  . Wears glasses     Past Surgical History:  Procedure Laterality Date  . ABDOMINAL HYSTERECTOMY  1989  . APPENDECTOMY    . CARDIAC CATHETERIZATION  2006   normal-done for work up pre hip surgeries  . CARPAL TUNNEL RELEASE Left   . CHOLECYSTECTOMY  1997  . JOINT REPLACEMENT     BTHA  . POLYPECTOMY Bilateral 06/26/2013   Procedure: BILATERAL EXCISION OF NASAL POLYPS ;  Surgeon: Ascencion Dike, MD;  Location: [place];  Service: ENT;  Laterality: Bilateral;  . SEPTOPLASTY N/A 06/26/2013   Procedure: AND SEPTOPLASTY;  Surgeon: Ascencion Dike, MD;  Location: [place];  Service: ENT;  Laterality: N/A;  .  THYROIDECTOMY  1994  . TONSILLECTOMY    . TOTAL HIP ARTHROPLASTY  2006   right  . TOTAL HIP ARTHROPLASTY  2006   left  . TOTAL KNEE ARTHROPLASTY Right 12/13/2014   Procedure: RIGHT TOTAL KNEE ARTHROPLASTY;  Surgeon: Gaynelle Arabian, MD;  Location[facility];  Service: Orthopedics;  Laterality: Right;    There were no vitals filed for this visit.  Subjective Assessment - 06/28/18 1242    Subjective   S: My right shoulder is bothering me today.     Currently in Pain?  Yes    Pain Score  4     Pain Location  Shoulder    Pain Orientation  Right    Pain Descriptors / Indicators  Sore    Pain Type  Acute pain    Pain Radiating Towards  Elbow    Pain Onset  More than a month ago    Pain Frequency  Intermittent    Aggravating Factors   Exercises and movement of arm    Pain Relieving Factors  Medication    Effect of Pain on Daily Activities  Mod effect on daily activities.     Multiple Pain Sites  No         [facility] OT Assessment -  06/28/18 1131      Assessment   Medical Diagnosis  bilateral shoulder pain      Precautions   Precautions  None               OT Treatments/Exercises (OP) - 06/28/18 1131      Exercises   Exercises  Shoulder      Shoulder Exercises: Supine   Protraction  PROM;5 reps;Both    Horizontal ABduction  PROM;5 reps;Both    External Rotation  PROM;5 reps;Both    Internal Rotation  PROM;5 reps;Both    Flexion  PROM;5 reps;Both    ABduction  PROM;5 reps;Both      Shoulder Exercises: Pulleys   Scaption  1 minute;Other (comment)   Required significant cuing for form     Shoulder Exercises: Therapy Ball   Flexion  Both;Other (comment)   12X   ABduction  Both;10 reps      Shoulder Exercises: ROM/Strengthening   Wall Wash  bilateral wall wash 1'     Thumb Tacks  1'    Other ROM/Strengthening Exercises  Bilateral table wash 1' using bed at waist level      Manual Therapy   Manual Therapy  Myofascial release    Manual therapy comments  Manual  complete separate than rest of tx    Myofascial Release  Myofascial release and manual stretching to bilateral upper arm, scapular, shoulder regions, and associated areas to decrease pain and restrictions and improve pain free mobility needed for improved independence with ADLs               OT Short Term Goals - 06/05/18 1302      OT SHORT TERM GOAL #1   Title  Pt will be provided with and independent in HEP to improve mobility required for ADL completion using BUE.     Time  4    Period  Weeks    Status  On-going      OT SHORT TERM GOAL #2   Title  Pt will decrease pain in BUE to 3/10 or less to improve ability to perform bathing tasks-washing behind back, washing hair, etc.     Time  4    Period  Weeks    Status  On-going      OT SHORT TERM GOAL #3   Title  Pt will decrease fascial restrictions in BUE to minimal amounts or less to improve ability to perform functional reaching tasks.     Time  4    Period  Weeks    Status  On-going      OT SHORT TERM GOAL #4   Title  Pt will improve BUE ROM to [facility] to improve ability to donn/doff bra and reaching overhead/behind back.     Time  4    Period  Weeks    Status  On-going      OT SHORT TERM GOAL #5   Title  Pt will improve BUE strength to 4/5 to improve ability to perform caregiving tasks for her Mother.     Time  4    Period  Weeks    Status  On-going               Plan - 06/28/18 1248    Clinical Impression Statement  A: Completed manual therapy to address mod fascial restrictions. Added pulleys in scaption, requiring significant cuing for form. Added thumb tacks, which pt tolerated well. Verbal cues for form and technique provided throughout  session.     Plan  P: Complete reassessment next session. Discuss progress with pt and whether or not to discharge. Provide HEP at discharge.      Consulted and Agree with Plan of Care  Patient       Patient will benefit from skilled therapeutic intervention in order  to improve the following deficits and impairments:  Decreased activity tolerance, Decreased strength, Impaired flexibility, Decreased range of motion, Pain, Increased fascial restrictions, Impaired UE functional use  Visit Diagnosis: Chronic left shoulder pain  Chronic right shoulder pain  Stiffness of left shoulder, not elsewhere classified  Stiffness of right shoulder, not elsewhere classified  Other symptoms and signs involving the musculoskeletal system    Problem List Patient Active Problem List   Diagnosis Date Noted  . Bilateral lower extremity edema 02/14/2018  . Essential hypertension 02/14/2018  . Hyperlipidemia 02/14/2018  . Family history of heart disease 02/14/2018  . Onychomycosis 08/26/2016  . OA (osteoarthritis) of knee 12/13/2014   Guadelupe Sabin, OTR/L  [phone] 06/28/2018, 5:16 PM  [place], [place], [address] Phone: [phone]   Fax:  [phone]  Name: [name] MRN: [id number] Date of Birth: [birth year]

## 2018-06-30 ENCOUNTER — Encounter (HOSPITAL_COMMUNITY): Payer: Self-pay

## 2018-06-30 ENCOUNTER — Ambulatory Visit (HOSPITAL_COMMUNITY): Payer: Medicare Other

## 2018-06-30 DIAGNOSIS — M25512 Pain in left shoulder: Secondary | ICD-10-CM | POA: Diagnosis not present

## 2018-06-30 DIAGNOSIS — M25612 Stiffness of left shoulder, not elsewhere classified: Secondary | ICD-10-CM | POA: Diagnosis not present

## 2018-06-30 DIAGNOSIS — M25611 Stiffness of right shoulder, not elsewhere classified: Secondary | ICD-10-CM

## 2018-06-30 DIAGNOSIS — R29898 Other symptoms and signs involving the musculoskeletal system: Secondary | ICD-10-CM

## 2018-06-30 DIAGNOSIS — M25511 Pain in right shoulder: Secondary | ICD-10-CM

## 2018-06-30 DIAGNOSIS — G8929 Other chronic pain: Secondary | ICD-10-CM

## 2018-06-30 NOTE — Patient Instructions (Signed)
Perform each exercise ___10, 12, 15_____ reps. 2-3x days.   Protraction - Sitting  Start by holding a wand or cane at chest height.  Next, slowly push the wand outwards in front of your body so that your elbows become fully straightened. Then, return to the original position.     Shoulder FLEXION - Sitting  In the standing position, hold a wand/cane with both arms, palms down on both sides. Raise up the wand/cane allowing your unaffected arm to perform most of the effort. Your affected arm should be partially relaxed.      Internal/External ROTATION - Sitting  In the standing position, hold a wand/cane with both hands keeping your elbows bent. Move your arms and wand/cane to one side.  Your affected arm should be partially relaxed while your unaffected arm performs most of the effort.       Shoulder ABDUCTION - Sitting  While holding a wand/cane palm face up on the injured side and palm face down on the uninjured side, slowly raise up your injured arm to the side.                     Horizontal Abduction/Adduction      Straight arms holding cane at shoulder height, bring cane to right, center, left. Repeat starting to left.   Copyright  VHI. All rights reserved.

## 2018-07-03 NOTE — Therapy (Signed)
[place], [place], [address] Phone: [phone]   Fax:  [phone]  Occupational Therapy Treatment And reassessment/discharge Patient Details  Name: [name] MRN: [id number] Date of Birth: Jul 20, 1941 Referring Provider (OT): Dr. Esmond Plants MD   Encounter Date: 06/30/2018     06/30/18 1012  OT Visits / Re-Eval  Visit Number 9  Number of Visits 9  Authorization  Authorization Type 1) Medicare A & B 2) Generic [employer]  Authorization Time Period No visit limit, based on medical necessity  OT Time Calculation  OT Start Time 1125 (reassessment and discharge)  OT Stop Time 1205  OT Time Calculation (min) 40 min  End of Session  Activity Tolerance Patient tolerated treatment well  Behavior During Therapy [facility] for tasks assessed/performed    Past Medical History:  Diagnosis Date  . Arthritis   . Bladder spasms    "spastic bladder" wears pads  . Cancer ([place])    Skin cancer -scalp and eyebrow"basal cell"  . Complication of anesthesia    woke up too early from a surgery  . GERD (gastroesophageal reflux disease)    occ. frequent "hiccoughs" after "eating, indigestion"  . Hyperlipemia   . Hypertension   . Hypothyroidism   . Trigger finger, left    alternate fingers affected. -Occ. shooting pain.  . Wears glasses     Past Surgical History:  Procedure Laterality Date  . ABDOMINAL HYSTERECTOMY  1989  . APPENDECTOMY    . CARDIAC CATHETERIZATION  2006   normal-done for work up pre hip surgeries  . CARPAL TUNNEL RELEASE Left   . CHOLECYSTECTOMY  1997  . JOINT REPLACEMENT     BTHA  . POLYPECTOMY Bilateral 06/26/2013   Procedure: BILATERAL EXCISION OF NASAL POLYPS ;  Surgeon: Ascencion Dike, MD;  Location: [place];  Service: ENT;  Laterality: Bilateral;  . SEPTOPLASTY N/A 06/26/2013   Procedure: AND SEPTOPLASTY;  Surgeon: Ascencion Dike, MD;  Location: [place];  Service:  ENT;  Laterality: N/A;  . THYROIDECTOMY  1994  . TONSILLECTOMY    . TOTAL HIP ARTHROPLASTY  2006   right  . TOTAL HIP ARTHROPLASTY  2006   left  . TOTAL KNEE ARTHROPLASTY Right 12/13/2014   Procedure: RIGHT TOTAL KNEE ARTHROPLASTY;  Surgeon: Gaynelle Arabian, MD;  [facility];  Service: Orthopedics;  Laterality: Right;    There were no vitals filed for this visit.  Subjective Assessment - 07/03/18 1004    Subjective   S: I was able to reach overhead and get a bottle of hot sauce today.     Special Tests  FOTO score: 59/100    Currently in Pain?  No/denies             06/30/18 1126  Assessment  Medical Diagnosis bilateral shoulder pain  Precautions  Precautions None  Observation/Other Assessments  Focus on Therapeutic Outcomes (FOTO)  59/100  Palpation  Palpation comment Trace fascial restriction in bilateral upper arm, trapezius, and scapularis region.   AROM  Overall AROM Comments Assessed seated, er/IR adducted  AROM Assessment Site Shoulder  Right/Left Shoulder Left;Right  Right Shoulder Flexion 120 Degrees (previous: 105)  Right Shoulder ABduction 120 Degrees (previous: 95)  Right Shoulder Internal Rotation 90 Degrees (previous: same)  Right Shoulder External Rotation 60 Degrees (previous: 68)  Left Shoulder Flexion 108 Degrees (previous: 105)  Left Shoulder ABduction 90 Degrees (previous: 112)  Left Shoulder Internal Rotation 90  Degrees (previous: same)  Left Shoulder External Rotation 45 Degrees (previous: 34)  PROM  Right Shoulder Flexion 135 Degrees (previous: 120)  Right Shoulder ABduction 180 Degrees (preivous: 170)  Right Shoulder Internal Rotation 90 Degrees (previous: same)  Right Shoulder External Rotation 35 Degrees (previous; 41)  Left Shoulder Flexion 130 Degrees (previous: 130)  Left Shoulder ABduction 110 Degrees (previous: 121)  Left Shoulder Internal Rotation 90 Degrees (previous: same)  Left Shoulder External Rotation 30  Degrees (previous: 43)  Overall PROM Comments Assessed supine, er/IR adducted  PROM Assessment Site Shoulder  Right/Left Shoulder Right;Left  Strength  Right Shoulder Flexion 4/5 (previous: 3+/5)  Right Shoulder ABduction 4/5 (previous: 3+/5)  Right Shoulder Internal Rotation 5/5 (previous: 4-/5)  Right Shoulder External Rotation 5/5 (previous: 3+/5)  Overall Strength Comments Assessed seated, er/IR adducted. Left UE strength not assessed previously  Strength Assessment Site Shoulder  Right/Left Shoulder Left;Right  Left Shoulder Flexion 4/5  Left Shoulder ABduction 3-/5  Left Shoulder Internal Rotation 5/5  Left Shoulder External Rotation 4/5                   OT Education - 07/03/18 1011    Education Details  AA/ROM shoulder exercises. Patient may stop previous HEP exercises.     Person(s) Educated  Patient    Methods  Explanation;Handout    Comprehension  Verbalized understanding       OT Short Term Goals - 06/30/18 1145      OT SHORT TERM GOAL #1   Title  Pt will be provided with and independent in HEP to improve mobility required for ADL completion using BUE.     Time  4    Period  Weeks    Status  Achieved      OT SHORT TERM GOAL #2   Title  Pt will decrease pain in BUE to 3/10 or less to improve ability to perform bathing tasks-washing behind back, washing hair, etc.     Time  4    Period  Weeks    Status  Achieved      OT SHORT TERM GOAL #3   Title  Pt will decrease fascial restrictions in BUE to minimal amounts or less to improve ability to perform functional reaching tasks.     Time  4    Period  Weeks    Status  Achieved      OT SHORT TERM GOAL #4   Title  Pt will improve BUE ROM to [facility] to improve ability to donn/doff bra and reaching overhead/behind back.     Time  4    Period  Weeks    Status  Partially Met      OT SHORT TERM GOAL #5   Title  Pt will improve BUE strength to 4/5 to improve ability to perform caregiving tasks for her  Mother.     Time  4    Period  Weeks    Status  Achieved               Plan - 07/03/18 1012    Clinical Impression Statement  A: Reassessment completed this date. patient has met all therapy goals with the exception of her  ROM goal which she partially met. At this point, patient is completing all activities as needed at home with less difficulty than when she started therapy. She has been educated on updated HEP and is able to complete independently at home. Discussed attending the [facility] and/or [facility]  in town to continue working on BUE strength and ROM. Patient voices understanding.     Plan  P: D/C from OT services with HEP. Follow up with MD as needed.     Consulted and Agree with Plan of Care  Patient       Patient will benefit from skilled therapeutic intervention in order to improve the following deficits and impairments:  Decreased activity tolerance, Decreased strength, Impaired flexibility, Decreased range of motion, Pain, Increased fascial restrictions, Impaired UE functional use  Visit Diagnosis: Chronic left shoulder pain  Chronic right shoulder pain  Stiffness of left shoulder, not elsewhere classified  Stiffness of right shoulder, not elsewhere classified  Other symptoms and signs involving the musculoskeletal system    Problem List Patient Active Problem List   Diagnosis Date Noted  . Bilateral lower extremity edema 02/14/2018  . Essential hypertension 02/14/2018  . Hyperlipidemia 02/14/2018  . Family history of heart disease 02/14/2018  . Onychomycosis 08/26/2016  . OA (osteoarthritis) of knee 12/13/2014   OCCUPATIONAL THERAPY DISCHARGE SUMMARY  Visits from Start of Care: 9  Current functional level related to goals / functional outcomes: See above  Remaining deficits: See above   Education / Equipment: See above Plan: Patient agrees to discharge.  Patient goals were met. Patient is being discharged due to meeting the stated rehab  goals.  ?????          Ailene Ravel, OTR/L,CBIS  [phone]  07/03/2018, 10:25 AM  [place] [address] [place], [place], [address] Phone: [phone]   Fax:  [phone]  Name: [name] MRN: [id number] Date of Birth: [birth year]

## 2018-07-04 ENCOUNTER — Encounter (HOSPITAL_COMMUNITY): Payer: 59 | Admitting: Occupational Therapy

## 2018-07-06 ENCOUNTER — Encounter (HOSPITAL_COMMUNITY): Payer: 59 | Admitting: Occupational Therapy

## 2018-07-06 DIAGNOSIS — L820 Inflamed seborrheic keratosis: Secondary | ICD-10-CM | POA: Diagnosis not present

## 2018-07-06 DIAGNOSIS — C44629 Squamous cell carcinoma of skin of left upper limb, including shoulder: Secondary | ICD-10-CM | POA: Diagnosis not present

## 2018-07-12 DIAGNOSIS — Z23 Encounter for immunization: Secondary | ICD-10-CM | POA: Diagnosis not present

## 2018-07-24 DIAGNOSIS — Z9889 Other specified postprocedural states: Secondary | ICD-10-CM | POA: Diagnosis not present

## 2018-07-24 DIAGNOSIS — Z853 Personal history of malignant neoplasm of breast: Secondary | ICD-10-CM | POA: Diagnosis not present

## 2018-07-24 DIAGNOSIS — B372 Candidiasis of skin and nail: Secondary | ICD-10-CM | POA: Diagnosis not present

## 2018-07-24 DIAGNOSIS — M85832 Other specified disorders of bone density and structure, left forearm: Secondary | ICD-10-CM | POA: Diagnosis not present

## 2018-07-24 DIAGNOSIS — C50912 Malignant neoplasm of unspecified site of left female breast: Secondary | ICD-10-CM | POA: Diagnosis not present

## 2018-07-24 DIAGNOSIS — M85859 Other specified disorders of bone density and structure, unspecified thigh: Secondary | ICD-10-CM | POA: Diagnosis not present

## 2018-07-24 DIAGNOSIS — R0789 Other chest pain: Secondary | ICD-10-CM | POA: Diagnosis not present

## 2018-07-24 DIAGNOSIS — C44629 Squamous cell carcinoma of skin of left upper limb, including shoulder: Secondary | ICD-10-CM | POA: Diagnosis not present

## 2018-07-24 DIAGNOSIS — Z08 Encounter for follow-up examination after completed treatment for malignant neoplasm: Secondary | ICD-10-CM | POA: Diagnosis not present

## 2018-07-24 DIAGNOSIS — Z79811 Long term (current) use of aromatase inhibitors: Secondary | ICD-10-CM | POA: Diagnosis not present

## 2018-08-10 DIAGNOSIS — Z85828 Personal history of other malignant neoplasm of skin: Secondary | ICD-10-CM | POA: Diagnosis not present

## 2018-08-10 DIAGNOSIS — Z08 Encounter for follow-up examination after completed treatment for malignant neoplasm: Secondary | ICD-10-CM | POA: Diagnosis not present

## 2018-08-14 ENCOUNTER — Encounter: Payer: Self-pay | Admitting: Cardiology

## 2018-08-14 ENCOUNTER — Ambulatory Visit (INDEPENDENT_AMBULATORY_CARE_PROVIDER_SITE_OTHER): Payer: Medicare Other | Admitting: Cardiology

## 2018-08-14 DIAGNOSIS — Z8249 Family history of ischemic heart disease and other diseases of the circulatory system: Secondary | ICD-10-CM | POA: Diagnosis not present

## 2018-08-14 DIAGNOSIS — Z853 Personal history of malignant neoplasm of breast: Secondary | ICD-10-CM

## 2018-08-14 DIAGNOSIS — M1711 Unilateral primary osteoarthritis, right knee: Secondary | ICD-10-CM | POA: Diagnosis not present

## 2018-08-14 DIAGNOSIS — I10 Essential (primary) hypertension: Secondary | ICD-10-CM

## 2018-08-14 DIAGNOSIS — E669 Obesity, unspecified: Secondary | ICD-10-CM

## 2018-08-14 MED ORDER — LOSARTAN POTASSIUM 50 MG PO TABS: 50.0000 mg | ORAL_TABLET | Freq: Every day | ORAL | 3 refills | Status: DC

## 2018-08-14 MED ORDER — ASPIRIN 81 MG PO TBEC: 81.0000 mg | DELAYED_RELEASE_TABLET | Freq: Every day | ORAL | 3 refills | Status: DC

## 2018-08-14 NOTE — Patient Instructions (Signed)
Medication Instructions:   Your physician has recommended you make the following change in your medication:  START LOSARTAN 50 MG, 1 TABLET BY MOUTH DAILY STOP ASPIRIN 325 MG START ASPIRIN 81 MG, 1 TABLET BY MOUTH DAILY  If you need a refill on your cardiac medications before your next appointment, please call your pharmacy.   Lab work: Your physician recommends that you return for lab work in: [place] (BMET)  If you have labs (blood work) drawn today and your tests are completely normal, you will receive your results only by: Marland Kitchen MyChart Message (if you have MyChart) OR . A paper copy in the mail If you have any lab test that is abnormal or we need to change your treatment, we will call you to review the results.  Testing/Procedures: NONE  Follow-Up: At [facility], you and your health needs are our priority.  As part of our continuing mission to provide you with exceptional heart care, we have created designated Provider Care Teams.  These Care Teams include your primary Cardiologist (physician) and Advanced Practice Providers (APPs -  Physician Assistants and Nurse Practitioners) who all work together to provide you with the care you need, when you need it. You will need a follow up appointment in 3 weeks [place], [place] Kerin Ransom, [place]

## 2018-08-14 NOTE — Assessment & Plan Note (Signed)
She has Rt knee DJD- Dr Wynelle Link following

## 2018-08-14 NOTE — Assessment & Plan Note (Signed)
Pt's mother (76 y/o) is a patient of Dr Kennon Holter

## 2018-08-14 NOTE — Assessment & Plan Note (Signed)
BMI 38 

## 2018-08-14 NOTE — Assessment & Plan Note (Signed)
Surgery in March 2018- no radiation or chemotherapy (on Arimidex).

## 2018-08-14 NOTE — Assessment & Plan Note (Addendum)
B/P rechecked in Rt arm-176/84

## 2018-08-14 NOTE — Progress Notes (Signed)
08/14/2018 [name]   10/20/1940  [id number]  Primary Physician Sharilyn Sites, MD Primary Cardiologist: Dr Gwenlyn Found  HPI: Patient is a pleasant 77 year old female with a history of breast cancer, status post surgery without radiation or chemotherapy in 2018, DJD status post bilateral hip in '06 and right knee replacement in 2016, and obesity. She was referred to Dr. Gwenlyn Found in June 2019 for lower extremity edema.  Dr. Gwenlyn Found actually follows her 26 year old mother.  The patient admits she is not too good about eating a low-salt diet.  Dr. Gwenlyn Found added a diuretic as needed and ordered an echo.  Echocardiogram revealed an EF of 60 to 65%.  She had grade 2 diastolic dysfunction.  She is seen in the office today for follow-up.  She has been doing well, she only takes the Lasix occasionally.    Current Outpatient Medications  Medication Sig Dispense Refill  . anastrozole (ARIMIDEX) 1 MG tablet Take 1 mg by mouth daily.    Marland Kitchen aspirin 81 MG EC tablet Take 1 tablet (81 mg total) by mouth daily. 90 tablet 3  . Calcium Citrate (CITRACAL PO) Take 2 tablets by mouth daily.    . furosemide (LASIX) 20 MG tablet Take 20-40 mg by mouth as needed.    Marland Kitchen levothyroxine (SYNTHROID, LEVOTHROID) 125 MCG tablet Take 125 mcg by mouth daily before breakfast.    . metoprolol (LOPRESSOR) 50 MG tablet Take 50 mg by mouth 2 (two) times daily.    . Multiple Vitamins-Minerals (PRESERVISION AREDS 2 PO) Take 1 tablet by mouth 2 (two) times daily.    . Omega-3 Fatty Acids (FISH OIL) 1000 MG CAPS Take 1,000 mg by mouth 2 (two) times daily.    Marland Kitchen oxybutynin (DITROPAN) 5 MG tablet Take 5 mg by mouth 2 (two) times daily.    . simvastatin (ZOCOR) 10 MG tablet Take 10 mg by mouth daily.    Marland Kitchen losartan (COZAAR) 50 MG tablet Take 1 tablet (50 mg total) by mouth daily. 90 tablet 3   No current facility-administered medications for this visit.     Allergies  Allergen Reactions  . Feldene [Piroxicam] Other (See Comments)    CANT  REMEMBER  . Ivp Dye [Iodinated Diagnostic Agents] Other (See Comments)    CANT REMEMBER  . Naproxen Nausea And Vomiting  . Sulfa Antibiotics     CANT REMEMBER  . Vicodin [Hydrocodone-Acetaminophen] Nausea And Vomiting    Past Medical History:  Diagnosis Date  . Arthritis   . Bladder spasms    "spastic bladder" wears pads  . Cancer ([place])    Skin cancer -scalp and eyebrow"basal cell"  . Complication of anesthesia    woke up too early from a surgery  . GERD (gastroesophageal reflux disease)    occ. frequent "hiccoughs" after "eating, indigestion"  . Hyperlipemia   . Hypertension   . Hypothyroidism   . Trigger finger, left    alternate fingers affected. -Occ. shooting pain.  . Wears glasses     Social History   Socioeconomic History  . Marital status: Single    Spouse name: Not on file  . Number of children: Not on file  . Years of education: Not on file  . Highest education level: Not on file  Occupational History  . Not on file  Social Needs  . Financial resource strain: Not on file  . Food insecurity:    Worry: Not on file    Inability: Not on file  . Transportation needs:  Medical: Not on file    Non-medical: Not on file  Tobacco Use  . Smoking status: Never Smoker  . Smokeless tobacco: Never Used  Substance and Sexual Activity  . Alcohol use: No  . Drug use: No  . Sexual activity: Not Currently  Lifestyle  . Physical activity:    Days per week: Not on file    Minutes per session: Not on file  . Stress: Not on file  Relationships  . Social connections:    Talks on phone: Not on file    Gets together: Not on file    Attends religious service: Not on file    Active member of club or organization: Not on file    Attends meetings of clubs or organizations: Not on file    Relationship status: Not on file  . Intimate partner violence:    Fear of current or ex partner: Not on file    Emotionally abused: Not on file    Physically abused: Not on file     Forced sexual activity: Not on file  Other Topics Concern  . Not on file  Social History Narrative  . Not on file     History reviewed. No pertinent family history.   Review of Systems: General: negative for chills, fever, night sweats or weight changes.  Cardiovascular: negative for chest pain, dyspnea on exertion, edema, orthopnea, palpitations, paroxysmal nocturnal dyspnea or shortness of breath Dermatological: negative for rash Respiratory: negative for cough or wheezing Urologic: negative for hematuria Abdominal: negative for nausea, vomiting, diarrhea, bright red blood per rectum, melena, or hematemesis Neurologic: negative for visual changes, syncope, or dizziness All other systems reviewed and are otherwise negative except as noted above.    Blood pressure (!) 174/86, pulse 61, height 5\' 2"  (1.575 m), weight 209 lb (94.8 kg), SpO2 98 %.  General appearance: alert, cooperative, no distress and moderately obese Neck: no carotid bruit and no JVD Lungs: clear to auscultation bilaterally Heart: regular rate and rhythm Extremities: no edema Skin: warm and dry Neurologic: Grossly normal  ASSESSMENT AND PLAN:   Essential hypertension B/P rechecked in Rt arm-176/84   History of breast cancer Surgery in March 2018- no radiation or chemotherapy (on Arimidex).  Obesity (BMI 30-39.9) BMI 38  Family history of heart disease Pt's mother (33 y/o) is a patient of Dr Kennon Holter  OA (osteoarthritis) of knee She has Rt knee DJD- Dr Wynelle Link following   PLAN  I cut her ASA back to 81 mg daily. If she needs a NSAID for DJD consider Celebrex.  I added Losartan 50 mg- uncontrolled HTN and grade 2 DD (hesitant to use Norvasc with her history of LE edema).  Check BMP in two weeks then f/u B/P check.   Kerin Ransom PA-C 08/14/2018 2:07 PM

## 2018-08-28 DIAGNOSIS — I10 Essential (primary) hypertension: Secondary | ICD-10-CM | POA: Diagnosis not present

## 2018-08-29 LAB — BASIC METABOLIC PANEL
BUN/Creatinine Ratio: 25 (ref 12–28)
BUN: 20 mg/dL (ref 8–27)
CO2: 21 mmol/L (ref 20–29)
Calcium: 9.4 mg/dL (ref 8.7–10.3)
Chloride: 105 mmol/L (ref 96–106)
Creatinine, Ser: 0.81 mg/dL (ref 0.57–1.00)
GFR calc Af Amer: 82 mL/min/{1.73_m2} (ref >59)
GFR calc non Af Amer: 71 mL/min/{1.73_m2} (ref >59)
Glucose: 86 mg/dL (ref 65–99)
Potassium: 5 mmol/L (ref 3.5–5.2)
Sodium: 143 mmol/L (ref 134–144)

## 2018-09-07 ENCOUNTER — Ambulatory Visit (INDEPENDENT_AMBULATORY_CARE_PROVIDER_SITE_OTHER): Payer: Medicare Other | Admitting: Cardiology

## 2018-09-07 ENCOUNTER — Encounter: Payer: Self-pay | Admitting: Cardiology

## 2018-09-07 VITALS — BP 142/78 | HR 76 | Ht 62.0 in | Wt 210.0 lb

## 2018-09-07 DIAGNOSIS — I10 Essential (primary) hypertension: Secondary | ICD-10-CM | POA: Diagnosis not present

## 2018-09-07 DIAGNOSIS — E669 Obesity, unspecified: Secondary | ICD-10-CM | POA: Diagnosis not present

## 2018-09-07 DIAGNOSIS — Z853 Personal history of malignant neoplasm of breast: Secondary | ICD-10-CM

## 2018-09-07 NOTE — Progress Notes (Signed)
09/07/2018 TAMEIA RAFFERTY   November 01, 1940  [id number]  Primary Physician Sharilyn Sites, MD Primary Cardiologist: Dr Gwenlyn Found  HPI:  Ms Hritz is a pleasant 78 year old female with a history of breast cancer, status post surgery without radiation or chemotherapy in 2018, DJD status post bilateral hip in '06 and right knee replacement in 2016, and obesity.  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  Dr. Gwenlyn Found actually follows her 22 year old mother.  The patient was referred to Dr. Gwenlyn Found in June 2019 for lower extremity edema.   The patient admits she is not too good about eating a low-salt diet.  Dr. Gwenlyn Found added a diuretic as needed and ordered an echo.  Echocardiogram revealed an EF of 60 to 65%.  She had grade 2 diastolic dysfunction.  She is seen in the office 08/14/18 for follow-up.  The Lasix helped her edema but her B/P was uncontrolled 178/84.  I added Losartan 50 mg.  She is in the office today for follow up.  She is taking the Losartan in the evening (?).  Her B/P in the office is 158/86.  She denies SOB or edema.  I had suggested she cut her ASA back to 81 mg daily but she has DJD in her Lt knee and she says 325 mg of ASA helps.     Current Outpatient Medications  Medication Sig Dispense Refill  . anastrozole (ARIMIDEX) 1 MG tablet Take 1 mg by mouth daily.    Marland Kitchen aspirin 325 MG tablet Take 325 mg by mouth every other day.    Marland Kitchen aspirin 81 MG EC tablet Take 1 tablet (81 mg total) by mouth daily. (Patient taking differently: Take 81 mg by mouth every other day. Every other day) 90 tablet 3  . Calcium Citrate (CITRACAL PO) Take 2 tablets by mouth 2 (two) times daily.     . furosemide (LASIX) 20 MG tablet Take 20-40 mg by mouth as needed.    Marland Kitchen levothyroxine (SYNTHROID, LEVOTHROID) 125 MCG tablet Take 125 mcg by mouth daily before breakfast.    . losartan (COZAAR) 50 MG tablet Take 1 tablet (50 mg total) by mouth daily. 90 tablet 3  . metoprolol (LOPRESSOR) 50 MG tablet Take 50 mg by mouth 2  (two) times daily.    . Multiple Vitamins-Minerals (PRESERVISION AREDS 2 PO) Take 1 tablet by mouth 2 (two) times daily.    . Omega-3 Fatty Acids (FISH OIL) 1000 MG CAPS Take 1,000 mg by mouth 2 (two) times daily.    Marland Kitchen oxybutynin (DITROPAN) 5 MG tablet Take 5 mg by mouth 2 (two) times daily.    . simvastatin (ZOCOR) 10 MG tablet Take 10 mg by mouth daily.     No current facility-administered medications for this visit.     Allergies  Allergen Reactions  . Feldene [Piroxicam] Other (See Comments)    CANT REMEMBER  . Ivp Dye [Iodinated Diagnostic Agents] Other (See Comments)    CANT REMEMBER  . Naproxen Nausea And Vomiting  . Sulfa Antibiotics     CANT REMEMBER  . Vicodin [Hydrocodone-Acetaminophen] Nausea And Vomiting    Past Medical History:  Diagnosis Date  . Arthritis   . Bladder spasms    "spastic bladder" wears pads  . Cancer ([place])    Skin cancer -scalp and eyebrow"basal cell"  . Complication of anesthesia    woke up too early from a surgery  . GERD (gastroesophageal reflux disease)    occ. frequent "hiccoughs" after "eating,  indigestion"  . Hyperlipemia   . Hypertension   . Hypothyroidism   . Trigger finger, left    alternate fingers affected. -Occ. shooting pain.  . Wears glasses     Social History   Socioeconomic History  . Marital status: Single    Spouse name: Not on file  . Number of children: Not on file  . Years of education: Not on file  . Highest education level: Not on file  Occupational History  . Not on file  Social Needs  . Financial resource strain: Not on file  . Food insecurity:    Worry: Not on file    Inability: Not on file  . Transportation needs:    Medical: Not on file    Non-medical: Not on file  Tobacco Use  . Smoking status: Never Smoker  . Smokeless tobacco: Never Used  Substance and Sexual Activity  . Alcohol use: No  . Drug use: No  . Sexual activity: Not Currently  Lifestyle  . Physical activity:    Days per week: Not  on file    Minutes per session: Not on file  . Stress: Not on file  Relationships  . Social connections:    Talks on phone: Not on file    Gets together: Not on file    Attends religious service: Not on file    Active member of club or organization: Not on file    Attends meetings of clubs or organizations: Not on file    Relationship status: Not on file  . Intimate partner violence:    Fear of current or ex partner: Not on file    Emotionally abused: Not on file    Physically abused: Not on file    Forced sexual activity: Not on file  Other Topics Concern  . Not on file  Social History Narrative  . Not on file     No family history on file.   Review of Systems: General: negative for chills, fever, night sweats or weight changes.  Cardiovascular: negative for chest pain, dyspnea on exertion, edema, orthopnea, palpitations, paroxysmal nocturnal dyspnea or shortness of breath Dermatological: negative for rash Respiratory: negative for cough or wheezing Urologic: negative for hematuria Abdominal: negative for nausea, vomiting, diarrhea, bright red blood per rectum, melena, or hematemesis Neurologic: negative for visual changes, syncope, or dizziness All other systems reviewed and are otherwise negative except as noted above.    Blood pressure (!) 142/78, pulse 76, height 5\' 2"  (1.575 m), weight 210 lb (95.3 kg), SpO2 99 %.  General appearance: alert, cooperative, no distress and morbidly obese Lungs: clear to auscultation bilaterally Heart: regular rate and rhythm Extremities: no edema Skin: Skin color, texture, turgor normal. No rashes or lesions Neurologic: Grossly normal   ASSESSMENT AND PLAN:   Essential hypertension B/P rechecked in Rt arm-156/84   History of breast cancer Surgery in March 2018- no radiation or chemotherapy (on Arimidex).  Obesity (BMI 30-39.9) BMI 38  Family history of heart disease Pt's mother (19 y/o) is a patient of Dr  Kennon Holter  OA (osteoarthritis) of knee She has Rt knee DJD- Dr Wynelle Link following   PLAN  I suggested she take the Losartan in the am.  She will check her B/P a few times a week and return in  4 weeks for follow up.  Her BMP showed a BUN of 20, a SCr 0.81, and a K+ of 5.0.  I suggested she avoid salt substitutes and high K+ foods.  OK to take ASA 325 mg three times a week if needed.    Kerin Ransom PA-C 09/07/2018 10:29 AM

## 2018-09-07 NOTE — Patient Instructions (Signed)
Medication Instructions:    Take Losartan tablet every morning   If you need a refill on your cardiac medications before your next appointment, please call your pharmacy.   Lab work: NONE  If you have labs (blood work) drawn today and your tests are completely normal, you will receive your results only by: Marland Kitchen MyChart Message (if you have MyChart) OR . A paper copy in the mail If you have any lab test that is abnormal or we need to change your treatment, we will call you to review the results.  Testing/Procedures:  NONE  Follow-Up: . Your physician recommends that you schedule a follow-up appointment in: 4 weeks with Kerin Ransom, PA-C .   Any Other Special Instructions Will Be Listed Below (If Applicable).

## 2018-10-06 ENCOUNTER — Ambulatory Visit: Payer: 59 | Admitting: Cardiology

## 2018-10-23 MED ORDER — METOPROLOL TARTRATE 50 MG PO TABS: 50.0000 mg | ORAL_TABLET | Freq: Every morning | ORAL | 0 refills | Status: DC

## 2018-10-23 NOTE — Addendum Note (Signed)
Addended by: Ulice Brilliant T on: 10/23/2018 03:54 PM   Modules accepted: Orders

## 2018-11-03 ENCOUNTER — Encounter: Payer: Self-pay | Admitting: Cardiology

## 2018-11-03 ENCOUNTER — Ambulatory Visit (INDEPENDENT_AMBULATORY_CARE_PROVIDER_SITE_OTHER): Payer: Medicare Other | Admitting: Cardiology

## 2018-11-03 VITALS — BP 160/92 | HR 58 | Ht 62.0 in | Wt 207.8 lb

## 2018-11-03 DIAGNOSIS — I10 Essential (primary) hypertension: Secondary | ICD-10-CM

## 2018-11-03 MED ORDER — BLOOD PRESSURE KIT DEVI: 1.0000 | Freq: Every day | 0 refills | Status: DC

## 2018-11-03 MED ORDER — HYDROCHLOROTHIAZIDE 12.5 MG PO CAPS: 12.5000 mg | ORAL_CAPSULE | Freq: Every day | ORAL | 6 refills | Status: DC

## 2018-11-03 MED ORDER — ASPIRIN EC 325 MG PO TBEC: 325.0000 mg | DELAYED_RELEASE_TABLET | Freq: Every day | ORAL | 0 refills | Status: DC

## 2018-11-03 NOTE — Patient Instructions (Addendum)
Medication Instructions:  STOP Aspirin 81mg   START Coated Aspirin 325mg  take 1 tablet once a day START Hydrochlorothiazide 12.5mg  take 1 tablet once a day  If you need a refill on your cardiac medications before your next appointment, please call your pharmacy.   Lab work: Your physician recommends that you return for lab work in: TODAY-BMET If you have labs (blood work) drawn today and your tests are completely normal, you will receive your results only by: Marland Kitchen MyChart Message (if you have MyChart) OR . A paper copy in the mail If you have any lab test that is abnormal or we need to change your treatment, we will call you to review the results.  Testing/Procedures: NONE   Follow-Up: At [facility], you and your health needs are our priority.  As part of our continuing mission to provide you with exceptional heart care, we have created designated Provider Care Teams.  These Care Teams include your primary Cardiologist (physician) and Advanced Practice Providers (APPs -  Physician Assistants and Nurse Practitioners) who all work together to provide you with the care you need, when you need it. You will need a follow up appointment in 2-3 months.  You may see Dr Quay Burow or one of the following Advanced Practice Providers on your designated Care Team:   Kerin Ransom, PA-C Roby Lofts, [place] . Sande Rives, PA-C  Any Other Special Instructions Will Be Listed Below (If Applicable). Blood pressure monitor prescription

## 2018-11-03 NOTE — Progress Notes (Signed)
11/03/2018 [name]   July 24, 1941  [id number]  Primary Physician Sharilyn Sites, MD Primary Cardiologist: Dr Gwenlyn Found  HPI:  The patient is a pleasant 78 year old female with a history of breast cancer, status post surgery without radiation or chemotherapy in 2018, DJD status post bilateral hip in '06, right knee replacement in 2016, and obesity.  Prior to her hip surgery in 2006 she had an abnormal Myoview.  This was followed by diagnostic catheterization by Dr. Rex Kras which revealed normal coronaries.    She was referred to Dr. Gwenlyn Found in June 2019 for lower extremity edema.  Dr. Gwenlyn Found added a diuretic.  Echocardiogram 03/03/2018 revealed an EF of 60 to 65%.  She had grade 2 diastolic dysfunction.  She was seen in the office 08/14/18  for follow-up.  She was doing well, Losartan was added for HTN.  She wasn't taking her Lasix on a regular basis.  When seen in f/u 09/07/2018 her B/P was elevated- she was taking her Losartan Q HS.  We asked her to take it in the am and she returns today for follow up. B/P by me was 150/80 with large cuff.  She denies any chest pain.  Her main complaint is pain in her Lt knee.  She says that ASA 325 mg is the only thing that helps.  She was told by dr Maureen Ralphs that she is "bone on bone".  She is hesitant to schedule TKR secondary to caring for her 42 y/o mother.    Current Outpatient Medications  Medication Sig Dispense Refill  . anastrozole (ARIMIDEX) 1 MG tablet Take 1 mg by mouth daily.    Marland Kitchen aspirin 81 MG EC tablet Take 1 tablet (81 mg total) by mouth daily. (Patient taking differently: Take 81 mg by mouth every other day. Every other day) 90 tablet 3  . Calcium Citrate (CITRACAL PO) Take 2 tablets by mouth 2 (two) times daily.     . furosemide (LASIX) 20 MG tablet Take 20-40 mg by mouth as needed.    Marland Kitchen levothyroxine (SYNTHROID, LEVOTHROID) 125 MCG tablet Take 125 mcg by mouth daily before breakfast.    . losartan (COZAAR) 50 MG tablet Take 1 tablet (50 mg  total) by mouth daily. (Patient taking differently: Take 50 mg by mouth. ) 90 tablet 3  . metoprolol tartrate (LOPRESSOR) 50 MG tablet Take 1 tablet (50 mg total) by mouth every morning. 30 tablet 0  . Multiple Vitamins-Minerals (PRESERVISION AREDS 2 PO) Take 1 tablet by mouth 2 (two) times daily.    . Omega-3 Fatty Acids (FISH OIL) 1000 MG CAPS Take 1,000 mg by mouth 2 (two) times daily.    Marland Kitchen oxybutynin (DITROPAN) 5 MG tablet Take 5 mg by mouth 2 (two) times daily.    . simvastatin (ZOCOR) 10 MG tablet Take 10 mg by mouth daily.     No current facility-administered medications for this visit.     Allergies  Allergen Reactions  . Feldene [Piroxicam] Other (See Comments)    CANT REMEMBER  . Ivp Dye [Iodinated Diagnostic Agents] Other (See Comments)    CANT REMEMBER  . Naproxen Nausea And Vomiting  . Sulfa Antibiotics     CANT REMEMBER  . Vicodin [Hydrocodone-Acetaminophen] Nausea And Vomiting    Past Medical History:  Diagnosis Date  . Arthritis   . Bladder spasms    "spastic bladder" wears pads  . Cancer ([place])    Skin cancer -scalp and eyebrow"basal cell"  . Complication of anesthesia  woke up too early from a surgery  . GERD (gastroesophageal reflux disease)    occ. frequent "hiccoughs" after "eating, indigestion"  . Hyperlipemia   . Hypertension   . Hypothyroidism   . Trigger finger, left    alternate fingers affected. -Occ. shooting pain.  . Wears glasses     Social History   Socioeconomic History  . Marital status: Single    Spouse name: Not on file  . Number of children: Not on file  . Years of education: Not on file  . Highest education level: Not on file  Occupational History  . Not on file  Social Needs  . Financial resource strain: Not on file  . Food insecurity:    Worry: Not on file    Inability: Not on file  . Transportation needs:    Medical: Not on file    Non-medical: Not on file  Tobacco Use  . Smoking status: Never Smoker  . Smokeless  tobacco: Never Used  Substance and Sexual Activity  . Alcohol use: No  . Drug use: No  . Sexual activity: Not Currently  Lifestyle  . Physical activity:    Days per week: Not on file    Minutes per session: Not on file  . Stress: Not on file  Relationships  . Social connections:    Talks on phone: Not on file    Gets together: Not on file    Attends religious service: Not on file    Active member of club or organization: Not on file    Attends meetings of clubs or organizations: Not on file    Relationship status: Not on file  . Intimate partner violence:    Fear of current or ex partner: Not on file    Emotionally abused: Not on file    Physically abused: Not on file    Forced sexual activity: Not on file  Other Topics Concern  . Not on file  Social History Narrative  . Not on file     No family history on file.   Review of Systems: General: negative for chills, fever, night sweats or weight changes.  Cardiovascular: negative for chest pain, dyspnea on exertion, orthopnea, palpitations, paroxysmal nocturnal dyspnea or shortness of breath Dermatological: negative for rash Respiratory: negative for cough or wheezing Urologic: negative for hematuria Abdominal: negative for nausea, vomiting, diarrhea, bright red blood per rectum, melena, or hematemesis Neurologic: negative for visual changes, syncope, or dizziness All other systems reviewed and are otherwise negative except as noted above.    Height 5\' 2"  (1.575 m), weight 207 lb 12.8 oz (94.3 kg).  General appearance: alert, cooperative, no distress and moderately obese Lungs: clear to auscultation bilaterally Heart: regular rate and rhythm Extremities: trace edema Skin: warm and dry Neurologic: Grossly normal  EKG NSR, SB-58  ASSESSMENT AND PLAN:   Essential hypertension B/P rechecked inRt arm-150/80  History of breast cancer Surgery in March 2018- no radiation or chemotherapy (on Arimidex).  Obesity  (BMI 30-39.9) BMI 38  OA (osteoarthritis) of knee She has Rt knee DJD- Dr Wynelle Link following  Family history of heart disease Pt's mother (2 y/o) is a patient of Dr Kennon Holter   PLAN  Add HCTZ 12.5 mg daily- check BMP today (last K+ 5.0).  I told her she could take coated ASA 325 mg daily instead of 81 mg secondary to knee arthritis.    Kerin Ransom PA-C 11/03/2018 1:54 PM

## 2018-11-04 LAB — BASIC METABOLIC PANEL
BUN/Creatinine Ratio: 30 — ABNORMAL HIGH (ref 12–28)
BUN: 24 mg/dL (ref 8–27)
CO2: 25 mmol/L (ref 20–29)
Calcium: 9.7 mg/dL (ref 8.7–10.3)
Chloride: 107 mmol/L — ABNORMAL HIGH (ref 96–106)
Creatinine, Ser: 0.8 mg/dL (ref 0.57–1.00)
GFR calc Af Amer: 83 mL/min/{1.73_m2} (ref >59)
GFR calc non Af Amer: 72 mL/min/{1.73_m2} (ref >59)
Glucose: 82 mg/dL (ref 65–99)
Potassium: 4.8 mmol/L (ref 3.5–5.2)
Sodium: 143 mmol/L (ref 134–144)

## 2018-11-08 ENCOUNTER — Ambulatory Visit (INDEPENDENT_AMBULATORY_CARE_PROVIDER_SITE_OTHER): Payer: Medicare Other | Admitting: Family

## 2018-11-27 DIAGNOSIS — E7849 Other hyperlipidemia: Secondary | ICD-10-CM | POA: Diagnosis not present

## 2018-11-27 DIAGNOSIS — E559 Vitamin D deficiency, unspecified: Secondary | ICD-10-CM | POA: Diagnosis not present

## 2018-11-27 DIAGNOSIS — E039 Hypothyroidism, unspecified: Secondary | ICD-10-CM | POA: Diagnosis not present

## 2018-11-27 DIAGNOSIS — M1991 Primary osteoarthritis, unspecified site: Secondary | ICD-10-CM | POA: Diagnosis not present

## 2018-11-27 DIAGNOSIS — Z6837 Body mass index (BMI) 37.0-37.9, adult: Secondary | ICD-10-CM | POA: Diagnosis not present

## 2018-11-27 DIAGNOSIS — Z1389 Encounter for screening for other disorder: Secondary | ICD-10-CM | POA: Diagnosis not present

## 2018-11-27 DIAGNOSIS — I10 Essential (primary) hypertension: Secondary | ICD-10-CM | POA: Diagnosis not present

## 2018-11-27 DIAGNOSIS — S93505A Unspecified sprain of left lesser toe(s), initial encounter: Secondary | ICD-10-CM | POA: Diagnosis not present

## 2019-01-19 DIAGNOSIS — M8588 Other specified disorders of bone density and structure, other site: Secondary | ICD-10-CM | POA: Diagnosis not present

## 2019-01-22 DIAGNOSIS — C50912 Malignant neoplasm of unspecified site of left female breast: Secondary | ICD-10-CM | POA: Diagnosis not present

## 2019-01-22 DIAGNOSIS — Z006 Encounter for examination for normal comparison and control in clinical research program: Secondary | ICD-10-CM | POA: Diagnosis not present

## 2019-01-22 DIAGNOSIS — Z170 Estrogen receptor positive status [ER+]: Secondary | ICD-10-CM | POA: Diagnosis not present

## 2019-01-22 DIAGNOSIS — M85832 Other specified disorders of bone density and structure, left forearm: Secondary | ICD-10-CM | POA: Diagnosis not present

## 2019-01-22 DIAGNOSIS — Z79811 Long term (current) use of aromatase inhibitors: Secondary | ICD-10-CM | POA: Diagnosis not present

## 2019-01-22 DIAGNOSIS — M8589 Other specified disorders of bone density and structure, multiple sites: Secondary | ICD-10-CM | POA: Diagnosis not present

## 2019-01-22 DIAGNOSIS — Z85828 Personal history of other malignant neoplasm of skin: Secondary | ICD-10-CM | POA: Diagnosis not present

## 2019-01-22 DIAGNOSIS — L989 Disorder of the skin and subcutaneous tissue, unspecified: Secondary | ICD-10-CM | POA: Diagnosis not present

## 2019-01-22 DIAGNOSIS — R0789 Other chest pain: Secondary | ICD-10-CM | POA: Diagnosis not present

## 2019-02-02 DIAGNOSIS — M1991 Primary osteoarthritis, unspecified site: Secondary | ICD-10-CM | POA: Diagnosis not present

## 2019-02-02 DIAGNOSIS — I10 Essential (primary) hypertension: Secondary | ICD-10-CM | POA: Diagnosis not present

## 2019-02-02 DIAGNOSIS — Z1389 Encounter for screening for other disorder: Secondary | ICD-10-CM | POA: Diagnosis not present

## 2019-02-02 DIAGNOSIS — E7849 Other hyperlipidemia: Secondary | ICD-10-CM | POA: Diagnosis not present

## 2019-02-02 DIAGNOSIS — Z0001 Encounter for general adult medical examination with abnormal findings: Secondary | ICD-10-CM | POA: Diagnosis not present

## 2019-02-02 DIAGNOSIS — Z6837 Body mass index (BMI) 37.0-37.9, adult: Secondary | ICD-10-CM | POA: Diagnosis not present

## 2019-02-28 ENCOUNTER — Other Ambulatory Visit: Payer: Self-pay

## 2019-02-28 ENCOUNTER — Ambulatory Visit (INDEPENDENT_AMBULATORY_CARE_PROVIDER_SITE_OTHER): Payer: Medicare Other | Admitting: Orthopedic Surgery

## 2019-02-28 ENCOUNTER — Encounter: Payer: Self-pay | Admitting: Orthopedic Surgery

## 2019-02-28 VITALS — Ht 62.0 in | Wt 207.8 lb

## 2019-02-28 DIAGNOSIS — B351 Tinea unguium: Secondary | ICD-10-CM

## 2019-02-28 DIAGNOSIS — M1712 Unilateral primary osteoarthritis, left knee: Secondary | ICD-10-CM | POA: Diagnosis not present

## 2019-02-28 DIAGNOSIS — L97521 Non-pressure chronic ulcer of other part of left foot limited to breakdown of skin: Secondary | ICD-10-CM | POA: Diagnosis not present

## 2019-02-28 MED ORDER — LIDOCAINE HCL 1 % IJ SOLN
5.0000 mL | INTRAMUSCULAR | Status: AC | PRN
  Administered 2019-02-28: 5 mL

## 2019-02-28 MED ORDER — METHYLPREDNISOLONE ACETATE 40 MG/ML IJ SUSP
40.0000 mg | INTRAMUSCULAR | Status: AC | PRN
  Administered 2019-02-28: 40 mg via INTRA_ARTICULAR

## 2019-02-28 NOTE — Progress Notes (Signed)
Office Visit Note   Patient: [name]           Date of Birth: 07/23/1941           MRN: [id number] Visit Date: 02/28/2019              Requested by: Sharilyn Sites, [address] [place],  [place] [address] PCP: Sharilyn Sites, MD  Chief Complaint  Patient presents with   Right Foot - Follow-up   Left Foot - Follow-up      HPI: Patient is a 78 year old woman who presents with painful onychomycotic nails x10 she is unable to safely trim the nails on her own.  #2 she complains of increasing arthritic pain in her left knee.  She states she has been told by her knee doctor she has bone-on-bone degenerative changes in the left knee.  She states she cannot bend her knee has difficulty with weightbearing.  #3 she complains of a painful ulcer beneath the left first metatarsal head.  Assessment & Plan: Visit Diagnoses:  1. Onychomycosis   2. Non-pressure chronic ulcer of other part of left foot limited to breakdown of skin (HCC)     Plan: The ulcer was debrided of skin and soft tissue the nails were trimmed x10 the left knee was injected.  Recommended that she use her walker and not her cane to assist with ambulation.  Follow-Up Instructions: Return in about 4 weeks (around 03/28/2019).   Ortho Exam  Patient is alert, oriented, no adenopathy, well-dressed, normal affect, normal respiratory effort. Examination patient is currently ambulating in a wheelchair due to pain she is able to slowly move in the office with a cane with her knee straight without flexion.  She is global tenderness to palpation around the left knee decreased range of motion there is no effusion no cellulitis.  Examination the left first metatarsal head she has a Wegner grade 1 ulcer.  After informed consent a 10 blade knife was used to debride the skin and soft tissue back to healthy tissue this is 10 mm in diameter 1 mm deep.  She has thickened discolored onychomycotic nails x10 she is unable to safely  trim them on her own and the nails were trimmed H47 without complications.  Imaging: No results found. No images are attached to the encounter.  Labs: No results found for: HGBA1C, ESRSEDRATE, CRP, LABURIC, REPTSTATUS, GRAMSTAIN, CULT, LABORGA   Lab Results  Component Value Date   ALBUMIN 3.9 12/06/2014    Body mass index is 38.01 kg/m.  Orders:  No orders of the defined types were placed in this encounter.  No orders of the defined types were placed in this encounter.    Procedures: Large Joint Inj: L knee on 02/28/2019 4:59 PM Indications: pain and diagnostic evaluation Details: 22 G 1.5 in needle, anteromedial approach  Arthrogram: No  Medications: 5 mL lidocaine 1 %; 40 mg methylPREDNISolone acetate 40 MG/ML Outcome: tolerated well, no immediate complications Procedure, treatment alternatives, risks and benefits explained, specific risks discussed. Consent was given by the patient. Immediately prior to procedure a time out was called to verify the correct patient, procedure, equipment, support staff and site/side marked as required. Patient was prepped and draped in the usual sterile fashion.      Clinical Data: No additional findings.  ROS:  All other systems negative, except as noted in the HPI. Review of Systems  Objective: Vital Signs: Ht 5\' 2"  (1.575 m)    Wt 207 lb  12.8 oz (94.3 kg)    BMI 38.01 kg/m   Specialty Comments:  No specialty comments available.  PMFS History: Patient Active Problem List   Diagnosis Date Noted   History of breast cancer 08/14/2018   Obesity (BMI 30-39.9) 08/14/2018   Bilateral lower extremity edema 02/14/2018   Essential hypertension 02/14/2018   Hyperlipidemia 02/14/2018   Family history of heart disease 02/14/2018   Onychomycosis 08/26/2016   OA (osteoarthritis) of knee 12/13/2014   Past Medical History:  Diagnosis Date   Arthritis    Bladder spasms    "spastic bladder" wears pads   Cancer ([place])     Skin cancer -scalp and eyebrow"basal cell"   Complication of anesthesia    woke up too early from a surgery   GERD (gastroesophageal reflux disease)    occ. frequent "hiccoughs" after "eating, indigestion"   Hyperlipemia    Hypertension    Hypothyroidism    Trigger finger, left    alternate fingers affected. -Occ. shooting pain.   Wears glasses     History reviewed. No pertinent family history.  Past Surgical History:  Procedure Laterality Date   ABDOMINAL HYSTERECTOMY  1989   APPENDECTOMY     CARDIAC CATHETERIZATION  2006   normal-done for work up pre hip surgeries   CARPAL TUNNEL RELEASE Left    [place]   POLYPECTOMY Bilateral 06/26/2013   Procedure: BILATERAL EXCISION OF NASAL POLYPS ;  Surgeon: Ascencion Dike, MD;  Location: [facility];  Service: ENT;  Laterality: Bilateral;   SEPTOPLASTY N/A 06/26/2013   Procedure: AND SEPTOPLASTY;  Surgeon: Ascencion Dike, MD;  Location: [facility];  Service: ENT;  Laterality: N/A;   [place]  2006   right   TOTAL HIP ARTHROPLASTY  2006   left   TOTAL KNEE ARTHROPLASTY Right 12/13/2014   Procedure: RIGHT TOTAL KNEE ARTHROPLASTY;  Surgeon: Gaynelle Arabian, MD;  Location: [facility];  Service: Orthopedics;  Laterality: Right;   Social History   Occupational History   Not on file  Tobacco Use   Smoking status: Never Smoker   Smokeless tobacco: Never Used  Substance and Sexual Activity   Alcohol use: No   Drug use: No   Sexual activity: Not Currently

## 2019-03-16 ENCOUNTER — Telehealth: Payer: Self-pay

## 2019-03-16 DIAGNOSIS — Z9012 Acquired absence of left breast and nipple: Secondary | ICD-10-CM | POA: Diagnosis not present

## 2019-03-16 DIAGNOSIS — C50612 Malignant neoplasm of axillary tail of left female breast: Secondary | ICD-10-CM | POA: Diagnosis not present

## 2019-03-16 DIAGNOSIS — Z79811 Long term (current) use of aromatase inhibitors: Secondary | ICD-10-CM | POA: Diagnosis not present

## 2019-03-16 DIAGNOSIS — Z170 Estrogen receptor positive status [ER+]: Secondary | ICD-10-CM | POA: Diagnosis not present

## 2019-03-16 DIAGNOSIS — C50512 Malignant neoplasm of lower-outer quadrant of left female breast: Secondary | ICD-10-CM | POA: Diagnosis not present

## 2019-03-16 DIAGNOSIS — C50812 Malignant neoplasm of overlapping sites of left female breast: Secondary | ICD-10-CM | POA: Diagnosis not present

## 2019-03-16 DIAGNOSIS — C50912 Malignant neoplasm of unspecified site of left female breast: Secondary | ICD-10-CM | POA: Diagnosis not present

## 2019-03-16 NOTE — Telephone Encounter (Signed)
    COVID-19 Pre-Screening Questions:  . In the past 7 to 10 days have you had a cough,  shortness of breath, headache, congestion, fever (100 or greater) body aches, chills, sore throat, or sudden loss of taste or sense of smell? NO . Have you been around anyone with known Covid 19? NO . Have you been around anyone who is awaiting Covid 19 test results in the past 7 to 10 days? NO . Have you been around anyone who has been exposed to Covid 19, or has mentioned symptoms of Covid 19 within the past 7 to 10 days? NO  If you have any concerns/questions about symptoms patients report during screening (either on the phone or at threshold). Contact the provider seeing the patient or DOD for further guidance.  If neither are available contact a member of the leadership team.   Spoke with pt who is ok with 7/15 virtual appt change to 7/14 as OV at 10:45am. She is aware of mask policy and of restrictions regarding guests and verbalized understanding

## 2019-03-20 ENCOUNTER — Encounter: Payer: Self-pay | Admitting: Cardiovascular Disease

## 2019-03-20 ENCOUNTER — Other Ambulatory Visit: Payer: Self-pay

## 2019-03-20 ENCOUNTER — Ambulatory Visit (INDEPENDENT_AMBULATORY_CARE_PROVIDER_SITE_OTHER): Payer: Medicare Other | Admitting: Cardiovascular Disease

## 2019-03-20 DIAGNOSIS — Z87898 Personal history of other specified conditions: Secondary | ICD-10-CM | POA: Insufficient documentation

## 2019-03-20 DIAGNOSIS — E782 Mixed hyperlipidemia: Secondary | ICD-10-CM

## 2019-03-20 DIAGNOSIS — R0789 Other chest pain: Secondary | ICD-10-CM

## 2019-03-20 DIAGNOSIS — I10 Essential (primary) hypertension: Secondary | ICD-10-CM | POA: Diagnosis not present

## 2019-03-20 DIAGNOSIS — R079 Chest pain, unspecified: Secondary | ICD-10-CM

## 2019-03-20 DIAGNOSIS — R600 Localized edema: Secondary | ICD-10-CM

## 2019-03-20 MED ORDER — ASPIRIN EC 325 MG PO TBEC: 325.0000 mg | DELAYED_RELEASE_TABLET | Freq: Every day | ORAL | Status: DC

## 2019-03-20 MED ORDER — ASPIRIN 81 MG PO TBEC: 81.0000 mg | DELAYED_RELEASE_TABLET | Freq: Every day | ORAL | 3 refills | Status: DC

## 2019-03-20 NOTE — Assessment & Plan Note (Signed)
History of bilateral lower extremity edema improved on low-dose diuretic

## 2019-03-20 NOTE — Assessment & Plan Note (Signed)
History of essential hypertension blood pressure measured at 124/70.  She is on losartan and metoprolol

## 2019-03-20 NOTE — Assessment & Plan Note (Signed)
History of hyperlipidemia on statin therapy with lipid profile performed 11/27/2018 revealing total cholesterol 185, LDL of 107 and HDL 55.

## 2019-03-20 NOTE — Progress Notes (Signed)
03/20/2019 [name]   June 26, 1941  [id number]  Primary Physician Sharilyn Sites, MD Primary Cardiologist: Lorretta Harp MD Garret [facility]dish, [place], [place]  HPI:  [name] is a 78 y.o.  mildly overweight single Caucasian female with no children referred by Collene Mares, PA-C to evaluate bilateral lower extremity edema.  I last saw her in the office 02/14/2018 She does have a history of treated hypertension and hyperlipidemia.  She is never had a heart attack or stroke.  Her mother does have CAD and she is my patient.  She denies chest pain or shortness of breath.  She is had lower extremity edema for the last several weeks with a recent BMP drawn by her PCP of 144.  She was begun on as needed Lasix which has improved her swelling somewhat.  Since I saw her a year ago she is done well until recently when she developed some chest pain occurring several times a week lasting minutes at a time.   Current Meds  Medication Sig   anastrozole (ARIMIDEX) 1 MG tablet Take 1 mg by mouth daily.   aspirin EC 325 MG tablet Take 1 tablet (325 mg total) by mouth daily.   Blood Pressure Monitoring (BLOOD PRESSURE KIT) DEVI 1 each by Does not apply route daily.   Calcium Citrate (CITRACAL PO) Take 2 tablets by mouth 2 (two) times daily.    furosemide (LASIX) 20 MG tablet Take 20-40 mg by mouth as needed.   hydrochlorothiazide (MICROZIDE) 12.5 MG capsule Take 1 capsule (12.5 mg total) by mouth daily.   levothyroxine (SYNTHROID, LEVOTHROID) 125 MCG tablet Take 125 mcg by mouth daily before breakfast.   metoprolol tartrate (LOPRESSOR) 50 MG tablet Take 1 tablet (50 mg total) by mouth every morning. (Patient taking differently: Take 50 mg by mouth 2 (two) times a day. )   Multiple Vitamins-Minerals (PRESERVISION AREDS 2 PO) Take 1 tablet by mouth 2 (two) times daily.   Omega-3 Fatty Acids (FISH OIL) 1000 MG CAPS Take 1,000 mg by mouth 2 (two) times daily.   oxybutynin (DITROPAN) 5 MG tablet  Take 5 mg by mouth 2 (two) times daily.   simvastatin (ZOCOR) 10 MG tablet Take 10 mg by mouth daily.     Allergies  Allergen Reactions   Feldene [Piroxicam] Other (See Comments)    CANT REMEMBER   Ivp Dye [Iodinated Diagnostic Agents] Other (See Comments)    CANT REMEMBER   Naproxen Nausea And Vomiting   Sulfa Antibiotics     CANT REMEMBER   Vicodin [Hydrocodone-Acetaminophen] Nausea And Vomiting    Social History   Socioeconomic History   Marital status: Single    Spouse name: Not on file   Number of children: Not on file   Years of education: Not on file   Highest education level: Not on file  Occupational History   Not on file  Social Needs   Financial resource strain: Not on file   Food insecurity    Worry: Not on file    Inability: Not on file   Transportation needs    Medical: Not on file    Non-medical: Not on file  Tobacco Use   Smoking status: Never Smoker   Smokeless tobacco: Never Used  Substance and Sexual Activity   Alcohol use: No   Drug use: No   Sexual activity: Not Currently  Lifestyle   Physical activity    Days per week: Not on file    Minutes  per session: Not on file   Stress: Not on file  Relationships   Social connections    Talks on phone: Not on file    Gets together: Not on file    Attends religious service: Not on file    Active member of club or organization: Not on file    Attends meetings of clubs or organizations: Not on file    Relationship status: Not on file   Intimate partner violence    Fear of current or ex partner: Not on file    Emotionally abused: Not on file    Physically abused: Not on file    Forced sexual activity: Not on file  Other Topics Concern   Not on file  Social History Narrative   Not on file     Review of Systems: General: negative for chills, fever, night sweats or weight changes.  Cardiovascular: negative for chest pain, dyspnea on exertion, edema, orthopnea,  palpitations, paroxysmal nocturnal dyspnea or shortness of breath Dermatological: negative for rash Respiratory: negative for cough or wheezing Urologic: negative for hematuria Abdominal: negative for nausea, vomiting, diarrhea, bright red blood per rectum, melena, or hematemesis Neurologic: negative for visual changes, syncope, or dizziness All other systems reviewed and are otherwise negative except as noted above.    Blood pressure 124/70, pulse (!) 59, temperature (!) 97.4 F (36.3 C), height _0  (1.575 m), weight 212 lb (96.2 kg).  General appearance: alert and no distress Neck: no adenopathy, no carotid bruit, no JVD, supple, symmetrical, trachea midline and thyroid not enlarged, symmetric, no tenderness/mass/nodules Lungs: clear to auscultation bilaterally Heart: regular rate and rhythm, S1, S2 normal, no murmur, click, rub or gallop Extremities: extremities normal, atraumatic, no cyanosis or edema Pulses: 2+ and symmetric Skin: Skin color, texture, turgor normal. No rashes or lesions Neurologic: Alert and oriented X 3, normal strength and tone. Normal symmetric reflexes. Normal coordination and gait  EKG not performed today  ASSESSMENT AND PLAN:   Bilateral lower extremity edema History of bilateral lower extremity edema improved on low-dose diuretic  Essential hypertension History of essential hypertension blood pressure measured at 124/70.  She is on losartan and metoprolol  Hyperlipidemia History of hyperlipidemia on statin therapy with lipid profile performed 11/27/2018 revealing total cholesterol 185, LDL of 107 and HDL 55.  Chest pain of uncertain etiology Recent onset of chest pain occurring several times a week lasting minutes at a time.  This is a new finding for her.  Going to get a pharmacologic Myoview stress test to further evaluate.      Lorretta Harp MD FACP,FACC,FAHA, [facility] 03/20/2019 11:09 AM

## 2019-03-20 NOTE — Patient Instructions (Addendum)
Medication Instructions:  Your physician recommends that you continue on your current medications as directed. Please refer to the Current Medication list given to you today.  If you need a refill on your cardiac medications before your next appointment, please call your pharmacy.   Lab work: NONE If you have labs (blood work) drawn today and your tests are completely normal, you will receive your results only by: Marland Kitchen MyChart Message (if you have MyChart) OR . A paper copy in the mail If you have any lab test that is abnormal or we need to change your treatment, we will call you to review the results.  Testing/Procedures: Your physician has requested that you have a lexiscan myoview. For further information please visit [email or web address]. Please follow instruction sheet, as given.   Cardiac Nuclear Scan A cardiac nuclear scan is a test that is done to check the flow of blood to your heart. It is done when you are resting and when you are exercising. The test looks for problems such as:  Not enough blood reaching a portion of the heart.  The heart muscle not working as it should. You may need this test if:  You have heart disease.  You have had lab results that are not normal.  You have had heart surgery or a balloon procedure to open up blocked arteries (angioplasty).  You have chest pain.  You have shortness of breath. In this test, a special dye (tracer) is put into your bloodstream. The tracer will travel to your heart. A camera will then take pictures of your heart to see how the tracer moves through your heart. This test is usually done at a hospital and takes 2-4 hours. Tell a doctor about:  Any allergies you have.  All medicines you are taking, including vitamins, herbs, eye drops, creams, and over-the-counter medicines.  Any problems you or family members have had with anesthetic medicines.  Any blood disorders you have.  Any surgeries you have had.  Any medical  conditions you have.  Whether you are pregnant or may be pregnant. What are the risks? Generally, this is a safe test. However, problems may occur, such as:  Serious chest pain and heart attack. This is only a risk if the stress portion of the test is done.  Rapid heartbeat.  A feeling of warmth in your chest. This feeling usually does not last long.  Allergic reaction to the tracer. What happens before the test?  Ask your doctor about changing or stopping your normal medicines. This is important.  Follow instructions from your doctor about what you cannot eat or drink.  Remove your jewelry on the day of the test. What happens during the test?  An IV tube will be inserted into one of your veins.  Your doctor will give you a small amount of tracer through the IV tube.  You will wait for 20-40 minutes while the tracer moves through your bloodstream.  Your heart will be monitored with an electrocardiogram (ECG).  You will lie down on an exam table.  Pictures of your heart will be taken for about 15-20 minutes.  You may also have a stress test. For this test, one of these things may be done: ? You will be asked to exercise on a treadmill or a stationary bike. ? You will be given medicines that will make your heart work harder. This is done if you are unable to exercise.  When blood flow to your heart has peaked, a  tracer will again be given through the IV tube.  After 20-40 minutes, you will get back on the exam table. More pictures will be taken of your heart.  Depending on the tracer that is used, more pictures may need to be taken 3-4 hours later.  Your IV tube will be removed when the test is over. The test may vary among doctors and hospitals. What happens after the test?  Ask your doctor: ? Whether you can return to your normal schedule, including diet, activities, and medicines. ? Whether you should drink more fluids. This will help to remove the tracer from your  body. Drink enough fluid to keep your pee (urine) pale yellow.  Ask your doctor, or the department that is doing the test: ? When will my results be ready? ? How will I get my results? Summary  A cardiac nuclear scan is a test that is done to check the flow of blood to your heart.  Tell your doctor whether you are pregnant or may be pregnant.  Before the test, ask your doctor about changing or stopping your normal medicines. This is important.  Ask your doctor whether you can return to your normal activities. You may be asked to drink more fluids. This information is not intended to replace advice given to you by your health care provider. Make sure you discuss any questions you have with your health care provider. Document Released: 02/06/2018 Document Revised: 12/13/2018 Document Reviewed: 02/06/2018 Elsevier Patient Education  [place]: At [facility], you and your health needs are our priority.  As part of our continuing mission to provide you with exceptional heart care, we have created designated Provider Care Teams.  These Care Teams include your primary Cardiologist (physician) and Advanced Practice Providers (APPs -  Physician Assistants and Nurse Practitioners) who all work together to provide you with the care you need, when you need it. You will need a follow up appointment in 6 months with Abigail Ransom, PA-C and in 12 months with Dr. Gwenlyn Mccormick.  Please call our office 2 months in advance to schedule each appointment.

## 2019-03-20 NOTE — Assessment & Plan Note (Signed)
Recent onset of chest pain occurring several times a week lasting minutes at a time.  This is a new finding for her.  Going to get a pharmacologic Myoview stress test to further evaluate.

## 2019-03-21 ENCOUNTER — Telehealth: Payer: Medicare Other | Admitting: Cardiovascular Disease

## 2019-03-29 ENCOUNTER — Encounter: Payer: Self-pay | Admitting: Orthopedic Surgery

## 2019-03-29 ENCOUNTER — Other Ambulatory Visit: Payer: Self-pay

## 2019-03-29 ENCOUNTER — Ambulatory Visit (INDEPENDENT_AMBULATORY_CARE_PROVIDER_SITE_OTHER): Payer: Medicare Other | Admitting: Orthopedic Surgery

## 2019-03-29 VITALS — Ht 62.0 in | Wt 212.0 lb

## 2019-03-29 DIAGNOSIS — B351 Tinea unguium: Secondary | ICD-10-CM

## 2019-03-29 DIAGNOSIS — M1712 Unilateral primary osteoarthritis, left knee: Secondary | ICD-10-CM | POA: Diagnosis not present

## 2019-03-29 DIAGNOSIS — L97521 Non-pressure chronic ulcer of other part of left foot limited to breakdown of skin: Secondary | ICD-10-CM

## 2019-03-30 ENCOUNTER — Telehealth (HOSPITAL_COMMUNITY): Payer: Self-pay

## 2019-03-30 NOTE — Telephone Encounter (Signed)
Encounter complete. 

## 2019-03-30 NOTE — Telephone Encounter (Signed)
  Patient returned call. Please call back after 2pm

## 2019-04-02 ENCOUNTER — Encounter: Payer: Self-pay | Admitting: Orthopedic Surgery

## 2019-04-02 NOTE — Progress Notes (Signed)
Office Visit Note   Patient: [name]           Date of Birth: 1941/02/15           MRN: [id number] Visit Date: 03/29/2019              Requested by: Sharilyn Sites, [place],  [place] [address] PCP: Sharilyn Sites, MD  Chief Complaint  Patient presents with  . Left Foot - Follow-up  . Right Foot - Follow-up  . Left Knee - Follow-up      HPI: Patient is a 78 year old woman who presents in follow-up for both lower extremities she states the injection in her left knee a month ago has helped her knee feels better she has no concerns with her knee she does complain of onychomycotic nails as well as plantar calluses.  Assessment & Plan: Visit Diagnoses: No diagnosis found.  Plan: Nails were trimmed calluses pared.  Follow-Up Instructions: Return in about 2 months (around 05/30/2019).   Ortho Exam  Patient is alert, oriented, no adenopathy, well-dressed, normal affect, normal respiratory effort. Examination patient has crepitation with range of motion of the left knee there is no effusion there is tenderness to palpation no redness no cellulitis.  Semination of her feet she does have some hypertrophic callus and after informed consent a 10 blade knife was used to pare the callus without complications.  She has thickened discolored onychomycotic nails and the nails were trimmed without complication.  Imaging: No results found. No images are attached to the encounter.  Labs: No results found for: HGBA1C, ESRSEDRATE, CRP, LABURIC, REPTSTATUS, GRAMSTAIN, CULT, LABORGA   Lab Results  Component Value Date   ALBUMIN 3.9 12/06/2014    No results found for: MG No results found for: VD25OH  No results found for: PREALBUMIN CBC EXTENDED Latest Ref Rng & Units 12/16/2014 12/15/2014 12/14/2014  WBC 4.0 - 10.5 K/uL 7.9 9.3 8.8  RBC 3.87 - 5.11 MIL/uL 3.17(L) 3.38(L) 3.76(L)  HGB 12.0 - 15.0 g/dL 9.4(L) 10.2(L) 11.2(L)  HCT 36.0 - 46.0 % 28.2(L) 29.9(L) 33.4(L)   PLT 150 - 400 K/uL 127(L) 141(L) 146(L)     Body mass index is 38.78 kg/m.  Orders:  No orders of the defined types were placed in this encounter.  No orders of the defined types were placed in this encounter.    Procedures: No procedures performed  Clinical Data: No additional findings.  ROS:  All other systems negative, except as noted in the HPI. Review of Systems  Objective: Vital Signs: Ht 5\' 2"  (1.575 m)   Wt 212 lb (96.2 kg)   BMI 38.78 kg/m   Specialty Comments:  No specialty comments available.  PMFS History: Patient Active Problem List   Diagnosis Date Noted  . Chest pain of uncertain etiology 16/60/6301  . History of breast cancer 08/14/2018  . Obesity (BMI 30-39.9) 08/14/2018  . Bilateral lower extremity edema 02/14/2018  . Essential hypertension 02/14/2018  . Hyperlipidemia 02/14/2018  . Family history of heart disease 02/14/2018  . Onychomycosis 08/26/2016  . OA (osteoarthritis) of knee 12/13/2014   Past Medical History:  Diagnosis Date  . Arthritis   . Bladder spasms    "spastic bladder" wears pads  . Cancer ([place])    Skin cancer -scalp and eyebrow"basal cell"  . Complication of anesthesia    woke up too early from a surgery  . GERD (gastroesophageal reflux disease)    occ. frequent "hiccoughs" after "eating, indigestion"  .  Hyperlipemia   . Hypertension   . Hypothyroidism   . Trigger finger, left    alternate fingers affected. -Occ. shooting pain.  . Wears glasses     History reviewed. No pertinent family history.  Past Surgical History:  Procedure Laterality Date  . ABDOMINAL HYSTERECTOMY  1989  . APPENDECTOMY    . CARDIAC CATHETERIZATION  2006   normal-done for work up pre hip surgeries  . CARPAL TUNNEL RELEASE Left   . CHOLECYSTECTOMY  1997  . JOINT REPLACEMENT     BTHA  . POLYPECTOMY Bilateral 06/26/2013   Procedure: BILATERAL EXCISION OF NASAL POLYPS ;  Surgeon: Ascencion Dike, MD;  Location: [facility];   Service: ENT;  Laterality: Bilateral;  . SEPTOPLASTY N/A 06/26/2013   Procedure: AND SEPTOPLASTY;  Surgeon: Ascencion Dike, MD;  Location: [facility];  Service: ENT;  Laterality: N/A;  . THYROIDECTOMY  1994  . TONSILLECTOMY    . TOTAL HIP ARTHROPLASTY  2006   right  . TOTAL HIP ARTHROPLASTY  2006   left  . TOTAL KNEE ARTHROPLASTY Right 12/13/2014   Procedure: RIGHT TOTAL KNEE ARTHROPLASTY;  Surgeon: Gaynelle Arabian, MD;  Location: [facility];  Service: Orthopedics;  Laterality: Right;   Social History   Occupational History  . Not on file  Tobacco Use  . Smoking status: Never Smoker  . Smokeless tobacco: Never Used  Substance and Sexual Activity  . Alcohol use: No  . Drug use: No  . Sexual activity: Not Currently

## 2019-04-03 ENCOUNTER — Telehealth (HOSPITAL_COMMUNITY): Payer: Self-pay | Admitting: *Deleted

## 2019-04-03 NOTE — Telephone Encounter (Signed)
Close encounter 

## 2019-04-04 ENCOUNTER — Ambulatory Visit (HOSPITAL_COMMUNITY)
Admission: RE | Admit: 2019-04-04 | Discharge: 2019-04-04 | Disposition: A | Discharge status: Home / Self Care | Payer: Medicare Other | Source: Ambulatory Visit | Attending: Internal Medicine | Admitting: Internal Medicine

## 2019-04-04 ENCOUNTER — Other Ambulatory Visit: Payer: Self-pay

## 2019-04-04 DIAGNOSIS — R079 Chest pain, unspecified: Secondary | ICD-10-CM | POA: Diagnosis not present

## 2019-04-04 MED ORDER — TECHNETIUM TC 99M TETROFOSMIN IV KIT
29.1000 | PACK | Freq: Once | INTRAVENOUS | Status: AC | PRN
  Administered 2019-04-04: 29.1 via INTRAVENOUS
  Filled 2019-04-04: qty 30

## 2019-04-04 MED ORDER — TECHNETIUM TC 99M TETROFOSMIN IV KIT
10.8000 | PACK | Freq: Once | INTRAVENOUS | Status: AC | PRN
  Administered 2019-04-04: 10.8 via INTRAVENOUS
  Filled 2019-04-04: qty 11

## 2019-04-04 MED ORDER — REGADENOSON 0.4 MG/5ML IV SOLN
0.4000 mg | Freq: Once | INTRAVENOUS | Status: AC
  Administered 2019-04-04: 0.4 mg via INTRAVENOUS

## 2019-04-05 LAB — MYOCARDIAL PERFUSION IMAGING
LV dias vol: 92 mL (ref 46–106)
LV sys vol: 41 mL
Peak HR: 90 {beats}/min
Rest HR: 51 {beats}/min
SDS: 6
SRS: 0
SSS: 6
TID: 1.27

## 2019-04-12 ENCOUNTER — Other Ambulatory Visit: Payer: Self-pay | Admitting: Cardiology

## 2019-04-13 ENCOUNTER — Other Ambulatory Visit: Payer: Self-pay | Admitting: Cardiology

## 2019-04-19 DIAGNOSIS — Z23 Encounter for immunization: Secondary | ICD-10-CM | POA: Diagnosis not present

## 2019-05-09 DIAGNOSIS — H5211 Myopia, right eye: Secondary | ICD-10-CM | POA: Diagnosis not present

## 2019-05-09 DIAGNOSIS — H524 Presbyopia: Secondary | ICD-10-CM | POA: Diagnosis not present

## 2019-05-09 DIAGNOSIS — Z961 Presence of intraocular lens: Secondary | ICD-10-CM | POA: Diagnosis not present

## 2019-05-09 DIAGNOSIS — H52223 Regular astigmatism, bilateral: Secondary | ICD-10-CM | POA: Diagnosis not present

## 2019-05-09 DIAGNOSIS — H353111 Nonexudative age-related macular degeneration, right eye, early dry stage: Secondary | ICD-10-CM | POA: Diagnosis not present

## 2019-05-09 DIAGNOSIS — H353121 Nonexudative age-related macular degeneration, left eye, early dry stage: Secondary | ICD-10-CM | POA: Diagnosis not present

## 2019-05-09 DIAGNOSIS — H26492 Other secondary cataract, left eye: Secondary | ICD-10-CM | POA: Diagnosis not present

## 2019-05-09 DIAGNOSIS — H26491 Other secondary cataract, right eye: Secondary | ICD-10-CM | POA: Diagnosis not present

## 2019-05-22 DIAGNOSIS — N3946 Mixed incontinence: Secondary | ICD-10-CM | POA: Diagnosis not present

## 2019-05-31 ENCOUNTER — Other Ambulatory Visit: Payer: Self-pay

## 2019-05-31 ENCOUNTER — Encounter: Payer: Self-pay | Admitting: Orthopedic Surgery

## 2019-05-31 ENCOUNTER — Ambulatory Visit (INDEPENDENT_AMBULATORY_CARE_PROVIDER_SITE_OTHER): Payer: Medicare Other | Admitting: Orthopedic Surgery

## 2019-05-31 VITALS — Ht 62.0 in | Wt 212.0 lb

## 2019-05-31 DIAGNOSIS — B351 Tinea unguium: Secondary | ICD-10-CM | POA: Diagnosis not present

## 2019-06-05 ENCOUNTER — Encounter: Payer: Self-pay | Admitting: Orthopedic Surgery

## 2019-06-05 NOTE — Progress Notes (Signed)
Office Visit Note   Patient: [name]           Date of Birth: 1941-03-05           MRN: [id number] Visit Date: 05/31/2019              Requested by: Sharilyn Sites, [place],  [place] [address] PCP: Sharilyn Sites, MD  Chief Complaint  Patient presents with  . Right Foot - Follow-up  . Left Foot - Follow-up      HPI: Patient is a 78 year old woman who presents in follow-up for both feet she has had history of ulcerations she has onychomycotic nails which she is unable to safely trim on her own.  Assessment & Plan: Visit Diagnoses:  1. Onychomycosis     Plan: Nails were trimmed [id number] without complication there are no foot ulcers continue with routine foot care.  Calluses pared x2.  Follow-Up Instructions: Return in about 3 months (around 08/30/2019).   Ortho Exam  Patient is alert, oriented, no adenopathy, well-dressed, normal affect, normal respiratory effort. Examination patient has 2 hypertrophic calluses on the plantar aspect of both feet.  I performed consent a 10 blade knife was used to pare the calluses without complications.  She has no redness no cellulitis no open ulcers.  She does have thickened discolored onychomycotic nails x10 she is unable to safely trim the nails on her own from the nails were trimmed Q000111Q without complications.  Imaging: No results found. No images are attached to the encounter.  Labs: No results found for: HGBA1C, ESRSEDRATE, CRP, LABURIC, REPTSTATUS, GRAMSTAIN, CULT, LABORGA   Lab Results  Component Value Date   ALBUMIN 3.9 12/06/2014    No results found for: MG No results found for: VD25OH  No results found for: PREALBUMIN CBC EXTENDED Latest Ref Rng & Units 12/16/2014 12/15/2014 12/14/2014  WBC 4.0 - 10.5 K/uL 7.9 9.3 8.8  RBC 3.87 - 5.11 MIL/uL 3.17(L) 3.38(L) 3.76(L)  HGB 12.0 - 15.0 g/dL 9.4(L) 10.2(L) 11.2(L)  HCT 36.0 - 46.0 % 28.2(L) 29.9(L) 33.4(L)  PLT 150 - 400 K/uL 127(L) 141(L) 146(L)      Body mass index is 38.78 kg/m.  Orders:  No orders of the defined types were placed in this encounter.  No orders of the defined types were placed in this encounter.    Procedures: No procedures performed  Clinical Data: No additional findings.  ROS:  All other systems negative, except as noted in the HPI. Review of Systems  Objective: Vital Signs: Ht 5\' 2"  (1.575 m)   Wt 212 lb (96.2 kg)   BMI 38.78 kg/m   Specialty Comments:  No specialty comments available.  PMFS History: Patient Active Problem List   Diagnosis Date Noted  . Chest pain of uncertain etiology [id number]  . History of breast cancer 08/14/2018  . Obesity (BMI 30-39.9) 08/14/2018  . Bilateral lower extremity edema 02/14/2018  . Essential hypertension 02/14/2018  . Hyperlipidemia 02/14/2018  . Family history of heart disease 02/14/2018  . Onychomycosis 08/26/2016  . OA (osteoarthritis) of knee 12/13/2014   Past Medical History:  Diagnosis Date  . Arthritis   . Bladder spasms    "spastic bladder" wears pads  . Cancer ([place])    Skin cancer -scalp and eyebrow"basal cell"  . Complication of anesthesia    woke up too early from a surgery  . GERD (gastroesophageal reflux disease)    occ. frequent "hiccoughs" after "eating, indigestion"  . Hyperlipemia   .  Hypertension   . Hypothyroidism   . Trigger finger, left    alternate fingers affected. -Occ. shooting pain.  . Wears glasses     History reviewed. No pertinent family history.  Past Surgical History:  Procedure Laterality Date  . ABDOMINAL HYSTERECTOMY  1989  . APPENDECTOMY    . CARDIAC CATHETERIZATION  2006   normal-done for work up pre hip surgeries  . CARPAL TUNNEL RELEASE Left   . CHOLECYSTECTOMY  1997  . JOINT REPLACEMENT     BTHA  . POLYPECTOMY Bilateral 06/26/2013   Procedure: BILATERAL EXCISION OF NASAL POLYPS ;  Surgeon: Ascencion Dike, MD;  Location: [facility];  Service: ENT;  Laterality: Bilateral;  .  SEPTOPLASTY N/A 06/26/2013   Procedure: AND SEPTOPLASTY;  Surgeon: Ascencion Dike, MD;  Location: [facility];  Service: ENT;  Laterality: N/A;  . THYROIDECTOMY  1994  . TONSILLECTOMY    . TOTAL HIP ARTHROPLASTY  2006   right  . TOTAL HIP ARTHROPLASTY  2006   left  . TOTAL KNEE ARTHROPLASTY Right 12/13/2014   Procedure: RIGHT TOTAL KNEE ARTHROPLASTY;  Surgeon: Gaynelle Arabian, MD;  Location: [facility];  Service: Orthopedics;  Laterality: Right;   Social History   Occupational History  . Not on file  Tobacco Use  . Smoking status: Never Smoker  . Smokeless tobacco: Never Used  Substance and Sexual Activity  . Alcohol use: No  . Drug use: No  . Sexual activity: Not Currently

## 2019-06-06 DIAGNOSIS — E782 Mixed hyperlipidemia: Secondary | ICD-10-CM | POA: Diagnosis not present

## 2019-06-06 DIAGNOSIS — M1991 Primary osteoarthritis, unspecified site: Secondary | ICD-10-CM | POA: Diagnosis not present

## 2019-06-06 DIAGNOSIS — E039 Hypothyroidism, unspecified: Secondary | ICD-10-CM | POA: Diagnosis not present

## 2019-06-06 DIAGNOSIS — I10 Essential (primary) hypertension: Secondary | ICD-10-CM | POA: Diagnosis not present

## 2019-06-15 DIAGNOSIS — H26491 Other secondary cataract, right eye: Secondary | ICD-10-CM | POA: Diagnosis not present

## 2019-06-15 DIAGNOSIS — Z961 Presence of intraocular lens: Secondary | ICD-10-CM | POA: Diagnosis not present

## 2019-06-15 DIAGNOSIS — H18413 Arcus senilis, bilateral: Secondary | ICD-10-CM | POA: Diagnosis not present

## 2019-06-15 DIAGNOSIS — H26492 Other secondary cataract, left eye: Secondary | ICD-10-CM | POA: Diagnosis not present

## 2019-06-21 DIAGNOSIS — Z961 Presence of intraocular lens: Secondary | ICD-10-CM | POA: Diagnosis not present

## 2019-06-21 DIAGNOSIS — H2701 Aphakia, right eye: Secondary | ICD-10-CM | POA: Diagnosis not present

## 2019-06-21 DIAGNOSIS — H52223 Regular astigmatism, bilateral: Secondary | ICD-10-CM | POA: Diagnosis not present

## 2019-06-21 DIAGNOSIS — H5211 Myopia, right eye: Secondary | ICD-10-CM | POA: Diagnosis not present

## 2019-06-21 DIAGNOSIS — Z9841 Cataract extraction status, right eye: Secondary | ICD-10-CM | POA: Diagnosis not present

## 2019-07-19 DIAGNOSIS — M1991 Primary osteoarthritis, unspecified site: Secondary | ICD-10-CM | POA: Diagnosis not present

## 2019-07-19 DIAGNOSIS — E7849 Other hyperlipidemia: Secondary | ICD-10-CM | POA: Diagnosis not present

## 2019-07-19 DIAGNOSIS — I10 Essential (primary) hypertension: Secondary | ICD-10-CM | POA: Diagnosis not present

## 2019-08-13 DIAGNOSIS — Z79811 Long term (current) use of aromatase inhibitors: Secondary | ICD-10-CM | POA: Diagnosis not present

## 2019-08-13 DIAGNOSIS — M8580 Other specified disorders of bone density and structure, unspecified site: Secondary | ICD-10-CM | POA: Diagnosis not present

## 2019-08-13 DIAGNOSIS — Z85828 Personal history of other malignant neoplasm of skin: Secondary | ICD-10-CM | POA: Diagnosis not present

## 2019-08-13 DIAGNOSIS — C50912 Malignant neoplasm of unspecified site of left female breast: Secondary | ICD-10-CM | POA: Diagnosis not present

## 2019-08-13 DIAGNOSIS — C50512 Malignant neoplasm of lower-outer quadrant of left female breast: Secondary | ICD-10-CM | POA: Diagnosis not present

## 2019-08-13 DIAGNOSIS — M85832 Other specified disorders of bone density and structure, left forearm: Secondary | ICD-10-CM | POA: Diagnosis not present

## 2019-08-13 DIAGNOSIS — Z872 Personal history of diseases of the skin and subcutaneous tissue: Secondary | ICD-10-CM | POA: Diagnosis not present

## 2019-08-13 DIAGNOSIS — Z170 Estrogen receptor positive status [ER+]: Secondary | ICD-10-CM | POA: Diagnosis not present

## 2019-08-27 ENCOUNTER — Other Ambulatory Visit: Payer: Self-pay

## 2019-08-27 ENCOUNTER — Ambulatory Visit (INDEPENDENT_AMBULATORY_CARE_PROVIDER_SITE_OTHER): Payer: Medicare Other | Admitting: Orthopedic Surgery

## 2019-08-27 ENCOUNTER — Encounter: Payer: Self-pay | Admitting: Orthopedic Surgery

## 2019-08-27 VITALS — Ht 62.0 in | Wt 212.0 lb

## 2019-08-27 DIAGNOSIS — L97521 Non-pressure chronic ulcer of other part of left foot limited to breakdown of skin: Secondary | ICD-10-CM

## 2019-08-27 DIAGNOSIS — B351 Tinea unguium: Secondary | ICD-10-CM | POA: Diagnosis not present

## 2019-08-27 NOTE — Progress Notes (Signed)
Office Visit Note   Patient: [name]           Date of Birth: 11/02/1940           MRN: [id number] Visit Date: 08/27/2019              Requested by: Sharilyn Sites, [address],  [place] [id number] PCP: Sharilyn Sites, MD  Chief Complaint  Patient presents with  . Left Foot - Follow-up  . Right Foot - Follow-up      HPI: Patient is a 78 year old woman who presents in evaluation for both feet she complains of a painful callus on the plantar aspect of her left foot as well as painful onychomycotic nails x10.  Assessment & Plan: Visit Diagnoses:  1. Onychomycosis   2. Non-pressure chronic ulcer of other part of left foot limited to breakdown of skin ([place])     Plan: Nails were trimmed x10 callus pared x1.  Reevaluate in 3 months.  Follow-Up Instructions: Return in about 3 months (around 11/25/2019).   Ortho Exam  Patient is alert, oriented, no adenopathy, well-dressed, normal affect, normal respiratory effort. Examination patient has no venous ulcers or plantar ulcers on her feet she does have a hyperkeratotic lesion on the plantar aspect the left foot.  After informed consent a 10 blade knife was used to pare the callus x1 she states her foot felt better.  She has thickened discolored onychomycotic nails x10 she is unable to safely trim the nails on her own and the nails were trimmed [id number] without complications.  Imaging: No results found. No images are attached to the encounter.  Labs: No results found for: HGBA1C, ESRSEDRATE, CRP, LABURIC, REPTSTATUS, GRAMSTAIN, CULT, LABORGA   Lab Results  Component Value Date   ALBUMIN 3.9 12/06/2014    No results found for: MG No results found for: VD25OH  No results found for: PREALBUMIN CBC EXTENDED Latest Ref Rng & Units 12/16/2014 12/15/2014 12/14/2014  WBC 4.0 - 10.5 K/uL 7.9 9.3 8.8  RBC 3.87 - 5.11 MIL/uL 3.17(L) 3.38(L) 3.76(L)  HGB 12.0 - 15.0 g/dL 9.4(L) 10.2(L) 11.2(L)  HCT 36.0 - 46.0 % 28.2(L)  29.9(L) 33.4(L)  PLT 150 - 400 K/uL 127(L) 141(L) 146(L)     Body mass index is 38.78 kg/m.  Orders:  No orders of the defined types were placed in this encounter.  No orders of the defined types were placed in this encounter.    Procedures: No procedures performed  Clinical Data: No additional findings.  ROS:  All other systems negative, except as noted in the HPI. Review of Systems  Objective: Vital Signs: Ht 5\' 2"  (1.575 m)   Wt 212 lb (96.2 kg)   BMI 38.78 kg/m   Specialty Comments:  No specialty comments available.  PMFS History: Patient Active Problem List   Diagnosis Date Noted  . Chest pain of uncertain etiology [id number]  . History of breast cancer 08/14/2018  . Obesity (BMI 30-39.9) 08/14/2018  . Bilateral lower extremity edema 02/14/2018  . Essential hypertension 02/14/2018  . Hyperlipidemia 02/14/2018  . Family history of heart disease 02/14/2018  . Onychomycosis 08/26/2016  . OA (osteoarthritis) of knee 12/13/2014   Past Medical History:  Diagnosis Date  . Arthritis   . Bladder spasms    "spastic bladder" wears pads  . Cancer ([facility])    Skin cancer -scalp and eyebrow"basal cell"  . Complication of anesthesia    woke up too early from a surgery  .  GERD (gastroesophageal reflux disease)    occ. frequent "hiccoughs" after "eating, indigestion"  . Hyperlipemia   . Hypertension   . Hypothyroidism   . Trigger finger, left    alternate fingers affected. -Occ. shooting pain.  . Wears glasses     History reviewed. No pertinent family history.  Past Surgical History:  Procedure Laterality Date  . ABDOMINAL HYSTERECTOMY  1989  . APPENDECTOMY    . CARDIAC CATHETERIZATION  2006   normal-done for work up pre hip surgeries  . CARPAL TUNNEL RELEASE Left   . CHOLECYSTECTOMY  1997  . JOINT REPLACEMENT     BTHA  . POLYPECTOMY Bilateral 06/26/2013   Procedure: BILATERAL EXCISION OF NASAL POLYPS ;  Surgeon: Ascencion Dike, MD;  Location: [facility];  Service: ENT;  Laterality: Bilateral;  . SEPTOPLASTY N/A 06/26/2013   Procedure: AND SEPTOPLASTY;  Surgeon: Ascencion Dike, MD;  Location: [facility];  Service: ENT;  Laterality: N/A;  . THYROIDECTOMY  1994  . TONSILLECTOMY    . TOTAL HIP ARTHROPLASTY  2006   right  . TOTAL HIP ARTHROPLASTY  2006   left  . TOTAL KNEE ARTHROPLASTY Right 12/13/2014   Procedure: RIGHT TOTAL KNEE ARTHROPLASTY;  Surgeon: Gaynelle Arabian, MD;  Location: [facility];  Service: Orthopedics;  Laterality: Right;   Social History   Occupational History  . Not on file  Tobacco Use  . Smoking status: Never Smoker  . Smokeless tobacco: Never Used  Substance and Sexual Activity  . Alcohol use: No  . Drug use: No  . Sexual activity: Not Currently

## 2019-09-10 ENCOUNTER — Telehealth: Payer: Self-pay

## 2019-09-10 NOTE — Telephone Encounter (Signed)
Called patient to change her appt to virtual. She was agreeable.      Virtual Visit Pre-Appointment Phone Call  "[name], I am calling you today to discuss your upcoming appointment. We are currently trying to limit exposure to the virus that causes COVID-19 by seeing patients at home rather than in the office."  1. "What is the BEST phone number to call the day of the visit?" - include this in appointment notes  2. "Do you have or have access to (through a family member/friend) a smartphone with video capability that we can use for your visit?" a. If yes - list this number in appt notes as "cell" (if different from BEST phone #) and list the appointment type as a VIDEO visit in appointment notes b. If no - list the appointment type as a PHONE visit in appointment notes  3. Confirm consent - "In the setting of the current Covid19 crisis, you are scheduled for a (phone or video) visit with your provider on (date) at (time).  Just as we do with many in-office visits, in order for you to participate in this visit, we must obtain consent.  If you'd like, I can send this to your mychart (if signed up) or email for you to review.  Otherwise, I can obtain your verbal consent now.  All virtual visits are billed to your insurance company just like a normal visit would be.  By agreeing to a virtual visit, we'd like you to understand that the technology does not allow for your provider to perform an examination, and thus may limit your provider's ability to fully assess your condition. If your provider identifies any concerns that need to be evaluated in person, we will make arrangements to do so.  Finally, though the technology is pretty good, we cannot assure that it will always work on either your or our end, and in the setting of a video visit, we may have to convert it to a phone-only visit.  In either situation, we cannot ensure that we have a secure connection.  Are you willing to proceed?"  STAFF: Did the patient verbally acknowledge consent to telehealth visit? Document YES/NO here: YES   4. Advise patient to be prepared - "Two hours prior to your appointment, go ahead and check your blood pressure, pulse, oxygen saturation, and your weight (if you have the equipment to check those) and write them all down. When your visit starts, your provider will ask you for this information. If you have an Apple Watch or Kardia device, please plan to have heart rate information ready on the day of your appointment. Please have a pen and paper handy nearby the day of the visit as well."  5. Give patient instructions for MyChart download to smartphone OR Doximity/Doxy.me as below if video visit (depending on what platform provider is using)  6. Inform patient they will receive a phone call 15 minutes prior to their appointment time (may be from unknown caller ID) so they should be prepared to answer    TELEPHONE CALL NOTE  [name] has been deemed a candidate for a follow-up tele-health visit to limit community exposure during the Covid-19 pandemic. I spoke with the patient via phone to ensure availability of phone/video source, confirm preferred email & phone number, and discuss instructions and expectations.  I reminded [name] to be prepared with any vital sign and/or heart rhythm information that could potentially be obtained via home monitoring, at  the time of her visit. I reminded [name] to expect a phone call prior to her visit.  Harold Hedge, [place] 09/10/2019 9:47 AM   INSTRUCTIONS FOR DOWNLOADING THE MYCHART APP TO SMARTPHONE  - The patient must first make sure to have activated MyChart and know their login information - If Apple, go to [employer] and type in MyChart in the search bar and download the app. If Android, ask patient to go to [employer] and type in [place] in the search bar and download the app. The app is free but as with any other app  downloads, their phone may require them to verify saved payment information or Apple/Android password.  - The patient will need to then log into the app with their MyChart username and password, and select [facility] as their healthcare provider to link the account. When it is time for your visit, go to the MyChart app, find appointments, and click Begin Video Visit. Be sure to Select Allow for your device to access the Microphone and Camera for your visit. You will then be connected, and your provider will be with you shortly.  **If they have any issues connecting, or need assistance please contact MyChart service desk [phone])**  **If using a computer, in order to ensure the best quality for their visit they will need to use either of the following Internet Browsers: [employer], or Google Chrome**  IF USING DOXIMITY or DOXY.ME - The patient will receive a link just prior to their visit by text.     FULL LENGTH CONSENT FOR TELE-HEALTH VISIT   I hereby voluntarily request, consent and authorize [facility] and its employed or contracted physicians, physician assistants, nurse practitioners or other licensed health care professionals (the Practitioner), to provide me with telemedicine health care services (the "Services") as deemed necessary by the treating Practitioner. I acknowledge and consent to receive the Services by the Practitioner via telemedicine. I understand that the telemedicine visit will involve communicating with the Practitioner through live audiovisual communication technology and the disclosure of certain medical information by electronic transmission. I acknowledge that I have been given the opportunity to request an in-person assessment or other available alternative prior to the telemedicine visit and am voluntarily participating in the telemedicine visit.  I understand that I have the right to withhold or withdraw my consent to the use of telemedicine  in the course of my care at any time, without affecting my right to future care or treatment, and that the Practitioner or I may terminate the telemedicine visit at any time. I understand that I have the right to inspect all information obtained and/or recorded in the course of the telemedicine visit and may receive copies of available information for a reasonable fee.  I understand that some of the potential risks of receiving the Services via telemedicine include:  Marland Kitchen Delay or interruption in medical evaluation due to technological equipment failure or disruption; . Information transmitted may not be sufficient (e.g. poor resolution of images) to allow for appropriate medical decision making by the Practitioner; and/or  . In rare instances, security protocols could fail, causing a breach of personal health information.  Furthermore, I acknowledge that it is my responsibility to provide information about my medical history, conditions and care that is complete and accurate to the best of my ability. I acknowledge that Practitioner's advice, recommendations, and/or decision may be based on factors not within their control, such as incomplete or inaccurate  data provided by me or distortions of diagnostic images or specimens that may result from electronic transmissions. I understand that the practice of medicine is not an exact science and that Practitioner makes no warranties or guarantees regarding treatment outcomes. I acknowledge that I will receive a copy of this consent concurrently upon execution via email to the email address I last provided but may also request a printed copy by calling the office of [facility].    I understand that my insurance will be billed for this visit.   I have read or had this consent read to me. . I understand the contents of this consent, which adequately explains the benefits and risks of the Services being provided via telemedicine.  . I have been provided ample  opportunity to ask questions regarding this consent and the Services and have had my questions answered to my satisfaction. . I give my informed consent for the services to be provided through the use of telemedicine in my medical care  By participating in this telemedicine visit I agree to the above.

## 2019-09-19 ENCOUNTER — Ambulatory Visit: Payer: Medicare Other | Admitting: Cardiology

## 2019-09-22 ENCOUNTER — Ambulatory Visit
Admission: EM | Admit: 2019-09-22 | Discharge: 2019-09-22 | Disposition: A | Discharge status: Home / Self Care | Payer: Medicare Other | Attending: Emergency Medicine | Admitting: Emergency Medicine

## 2019-09-22 ENCOUNTER — Other Ambulatory Visit: Payer: Self-pay

## 2019-09-22 DIAGNOSIS — A084 Viral intestinal infection, unspecified: Secondary | ICD-10-CM | POA: Diagnosis not present

## 2019-09-22 DIAGNOSIS — Z20822 Contact with and (suspected) exposure to covid-19: Secondary | ICD-10-CM

## 2019-09-22 MED ORDER — LOPERAMIDE HCL 2 MG PO CAPS: 2.0000 mg | ORAL_CAPSULE | Freq: Four times a day (QID) | ORAL | 0 refills | Status: DC | PRN

## 2019-09-22 NOTE — Discharge Instructions (Addendum)
DIET Instructions:  Increase your fluid intake to replace losses. Advance to bland foods, applesauce, rice, baked or boiled chicken, ect. Avoid milk, greasy foods and anything that doesn't agree with you.  If you experience new or worsening symptoms return or go to ER such as fever, chills, nausea, vomiting, diarrhea, bloody or dark tarry stools, mucoid stool, constipation, urinary symptoms, worsening abdominal discomfort, symptoms that do not improve, inability to keep fluids down, etc...  Reviewed expectations re: course of current medical issues. Questions answered. Outlined signs and symptoms indicating need for more acute intervention. Patient verbalized understanding. After Visit Summary given.

## 2019-09-22 NOTE — ED Triage Notes (Signed)
Pt has had inttermittent diarrhea since Tuesday , pt states she has appetite but has avoided eating to avoid diarrhea . Took 2 imodium yesterday afternoon then 1 at 10:30 then 1 at 4:30 am .  pt had accident in the waiting room and soiled clothes, assisted patient with undressing and getting covered with gown .

## 2019-09-22 NOTE — ED Provider Notes (Addendum)
[facility]    CSN: [id number] Arrival date & time: 09/22/19  1414      History   Chief Complaint Chief Complaint  Patient presents with  . Diarrhea    HPI [name] is a 79 y.o. female.   [name] 79 years old female presented to the urgent care with a  complaint of diarrhea for the past 5 days.  She reports eating cheesy foods the previous day. States no one around her has similar symptoms.  She has not vomit.  Patient has had 2 episode of diarrhea today.  She has tried taking imodium with mild relief.  Her symptom are made worst with eating.  She denies fever, chills, nausea, abdominal pain, melena, hematochezia.   The history is provided by the patient. A language interpreter was used.  Diarrhea   Past Medical History:  Diagnosis Date  . Arthritis   . Bladder spasms    "spastic bladder" wears pads  . Cancer ([place])    Skin cancer -scalp and eyebrow"basal cell"  . Complication of anesthesia    woke up too early from a surgery  . GERD (gastroesophageal reflux disease)    occ. frequent "hiccoughs" after "eating, indigestion"  . Hyperlipemia   . Hypertension   . Hypothyroidism   . Trigger finger, left    alternate fingers affected. -Occ. shooting pain.  . Wears glasses     Patient Active Problem List   Diagnosis Date Noted  . Chest pain of uncertain etiology 40/98/1191  . History of breast cancer 08/14/2018  . Obesity (BMI 30-39.9) 08/14/2018  . Bilateral lower extremity edema 02/14/2018  . Essential hypertension 02/14/2018  . Hyperlipidemia 02/14/2018  . Family history of heart disease 02/14/2018  . Onychomycosis 08/26/2016  . OA (osteoarthritis) of knee 12/13/2014    Past Surgical History:  Procedure Laterality Date  . ABDOMINAL HYSTERECTOMY  1989  . APPENDECTOMY    . CARDIAC CATHETERIZATION  2006   normal-done for work up pre hip surgeries  . CARPAL TUNNEL RELEASE Left   . CHOLECYSTECTOMY  1997  . JOINT REPLACEMENT     BTHA  .  POLYPECTOMY Bilateral 06/26/2013   Procedure: BILATERAL EXCISION OF NASAL POLYPS ;  Surgeon: Ascencion Dike, MD;  Location: [facility];  Service: ENT;  Laterality: Bilateral;  . SEPTOPLASTY N/A 06/26/2013   Procedure: AND SEPTOPLASTY;  Surgeon: Ascencion Dike, MD;  Location: [facility];  Service: ENT;  Laterality: N/A;  . THYROIDECTOMY  1994  . TONSILLECTOMY    . TOTAL HIP ARTHROPLASTY  2006   right  . TOTAL HIP ARTHROPLASTY  2006   left  . TOTAL KNEE ARTHROPLASTY Right 12/13/2014   Procedure: RIGHT TOTAL KNEE ARTHROPLASTY;  Surgeon: Gaynelle Arabian, MD;  Location: [facility];  Service: Orthopedics;  Laterality: Right;    OB History   No obstetric history on file.      Home Medications    Prior to Admission medications   Medication Sig Start Date End Date Taking? Authorizing Provider  anastrozole (ARIMIDEX) 1 MG tablet Take 1 mg by mouth daily.    [provider]  aspirin EC 325 MG tablet Take 1 tablet (325 mg total) by mouth daily. 03/20/19   Lorretta Harp, MD  Blood Pressure Monitoring (BLOOD PRESSURE KIT) DEVI 1 each by Does not apply route daily. 11/03/18   Erlene Quan, PA-C  Calcium Citrate (CITRACAL PO) Take 2 tablets by mouth 2 (two) times daily.  [provider]  furosemide (LASIX) 20 MG tablet Take 20-40 mg by mouth as needed.    [provider]  hydrochlorothiazide (MICROZIDE) 12.5 MG capsule TAKE 1 CAPSULE BY MOUTH EVERY DAY 04/12/19   Lorretta Harp, MD  levothyroxine (SYNTHROID, LEVOTHROID) 125 MCG tablet Take 125 mcg by mouth daily before breakfast.    [provider]  loperamide (IMODIUM) 2 MG capsule Take 1 capsule (2 mg total) by mouth 4 (four) times daily as needed for diarrhea or loose stools. 09/22/19   Isola Mehlman, Darrelyn Hillock, FNP  losartan (COZAAR) 50 MG tablet Take 1 tablet (50 mg total) by mouth daily. Patient taking differently: Take 50 mg by mouth.  08/14/18 11/12/18  Erlene Quan, PA-C  metoprolol  tartrate (LOPRESSOR) 50 MG tablet Take 1 tablet (50 mg total) by mouth every morning. Patient taking differently: Take 50 mg by mouth 2 (two) times a day.  10/23/18   Erlene Quan, PA-C  Multiple Vitamins-Minerals (PRESERVISION AREDS 2 PO) Take 1 tablet by mouth 2 (two) times daily.    [provider]  Omega-3 Fatty Acids (FISH OIL) 1000 MG CAPS Take 1,000 mg by mouth 2 (two) times daily.    [provider]  oxybutynin (DITROPAN) 5 MG tablet Take 5 mg by mouth 2 (two) times daily.    [provider]  simvastatin (ZOCOR) 10 MG tablet Take 10 mg by mouth daily.    [provider]    Family History History reviewed. No pertinent family history.  Social History Social History   Tobacco Use  . Smoking status: Never Smoker  . Smokeless tobacco: Never Used  Substance Use Topics  . Alcohol use: No  . Drug use: No     Allergies   Feldene [piroxicam], Ivp dye [iodinated diagnostic agents], Naproxen, Sulfa antibiotics, and Vicodin [hydrocodone-acetaminophen]   Review of Systems Review of Systems  Constitutional: Negative.   Respiratory: Negative.   Cardiovascular: Negative.   Gastrointestinal: Positive for diarrhea.  All other systems reviewed and are negative.    Physical Exam Triage Vital Signs ED Triage Vitals  Enc Vitals Group     BP      Pulse      Resp      Temp      Temp src      SpO2      Weight      Height      Head Circumference      Peak Flow      Pain Score      Pain Loc      Pain Edu?      Excl. in [place]?    No data found.  Updated Vital Signs BP 102/66   Pulse 82   Temp 99 F (37.2 C)   Resp 18   Visual Acuity Right Eye Distance:   Left Eye Distance:   Bilateral Distance:    Right Eye Near:   Left Eye Near:    Bilateral Near:     Physical Exam Vitals and nursing note reviewed.  Constitutional:      General: She is not in acute distress.    Appearance: She is normal weight. She is not ill-appearing,  toxic-appearing or diaphoretic.  Cardiovascular:     Rate and Rhythm: Normal rate and regular rhythm.     Pulses: Normal pulses.     Heart sounds: Normal heart sounds. No murmur.  Pulmonary:     Effort: Pulmonary effort is normal. No respiratory  distress.     Breath sounds: Normal breath sounds. No wheezing.  Chest:     Chest wall: No tenderness.  Abdominal:     General: Abdomen is flat. Bowel sounds are normal. There is no distension.     Palpations: Abdomen is soft. There is no mass.     Tenderness: There is no abdominal tenderness. There is no right CVA tenderness, left CVA tenderness, guarding or rebound.     Hernia: No hernia is present.  Neurological:     Mental Status: She is alert.      UC Treatments / Results  Labs (all labs ordered are listed, but only abnormal results are displayed) Labs Reviewed  NOVEL CORONAVIRUS, NAA    EKG   Radiology No results found.  Procedures Procedures (including critical care time)  Medications Ordered in UC Medications - No data to display  Initial Impression / Assessment and Plan / UC Course  I have reviewed the triage vital signs and the nursing notes.  Pertinent labs & imaging results that were available during my care of the patient were reviewed by me and considered in my medical decision making (see chart for details).     Patient symptomatically of a viral gastroenteritis Advised patient to eat bland food To increase diet as tolerated To return or go to ED for worsening of symptoms  Final Clinical Impressions(s) / UC Diagnoses   Final diagnoses:  Viral gastroenteritis  COVID-19 ruled out     Discharge Instructions     DIET Instructions:  Increase your fluid intake to replace losses. Advance to bland foods, applesauce, rice, baked or boiled chicken, ect. Avoid milk, greasy foods and anything that doesn't agree with you.  If you experience new or worsening symptoms return or go to ER such as fever, chills,  nausea, vomiting, diarrhea, bloody or dark tarry stools, mucoid stool, constipation, urinary symptoms, worsening abdominal discomfort, symptoms that do not improve, inability to keep fluids down, etc...  Reviewed expectations re: course of current medical issues. Questions answered. Outlined signs and symptoms indicating need for more acute intervention. Patient verbalized understanding. After Visit Summary given.    ED Prescriptions    Medication Sig Dispense Auth. Provider   loperamide (IMODIUM) 2 MG capsule Take 1 capsule (2 mg total) by mouth 4 (four) times daily as needed for diarrhea or loose stools. 12 capsule Makenzye Troutman, Darrelyn Hillock, FNP     PDMP not reviewed this encounter.   Emerson Monte, FNP 09/22/19 1533    Emerson Monte, FNP 09/22/19 1534

## 2019-09-23 LAB — NOVEL CORONAVIRUS, NAA: SARS-CoV-2, NAA: NOT DETECTED

## 2019-10-02 ENCOUNTER — Telehealth: Payer: Self-pay

## 2019-10-02 ENCOUNTER — Encounter: Payer: Self-pay | Admitting: Cardiology

## 2019-10-02 ENCOUNTER — Telehealth (INDEPENDENT_AMBULATORY_CARE_PROVIDER_SITE_OTHER): Payer: Medicare Other | Admitting: Cardiology

## 2019-10-02 VITALS — BP 147/99 | HR 53 | Ht 63.0 in | Wt 212.0 lb

## 2019-10-02 DIAGNOSIS — E782 Mixed hyperlipidemia: Secondary | ICD-10-CM

## 2019-10-02 DIAGNOSIS — I10 Essential (primary) hypertension: Secondary | ICD-10-CM

## 2019-10-02 DIAGNOSIS — Z87898 Personal history of other specified conditions: Secondary | ICD-10-CM

## 2019-10-02 DIAGNOSIS — M1711 Unilateral primary osteoarthritis, right knee: Secondary | ICD-10-CM

## 2019-10-02 DIAGNOSIS — Z853 Personal history of malignant neoplasm of breast: Secondary | ICD-10-CM

## 2019-10-02 NOTE — Assessment & Plan Note (Signed)
Surgery (Lt mastectomy) in March 2018- no radiation or chemotherapy (on Arimidex).

## 2019-10-02 NOTE — Patient Instructions (Signed)
Medication Instructions:  Your physician recommends that you continue on your current medications as directed. Please refer to the Current Medication list given to you today. *If you need a refill on your cardiac medications before your next appointment, please call your pharmacy*  Lab Work: None  If you have labs (blood work) drawn today and your tests are completely normal, you will receive your results only by: Marland Kitchen MyChart Message (if you have MyChart) OR . A paper copy in the mail If you have any lab test that is abnormal or we need to change your treatment, we will call you to review the results.  Testing/Procedures: None   Follow-Up: At [facility], you and your health needs are our priority.  As part of our continuing mission to provide you with exceptional heart care, we have created designated Provider Care Teams.  These Care Teams include your primary Cardiologist (physician) and Advanced Practice Providers (APPs -  Physician Assistants and Nurse Practitioners) who all work together to provide you with the care you need, when you need it.  Your next appointment:   2 day(s)  The format for your next appointment:   In Person  Provider:   Kerin Ransom, PA-C  Other Instructions

## 2019-10-02 NOTE — Telephone Encounter (Signed)
Contacted patient to discuss AVS Instructions. Gave patient Luke's recommendations from today's virtual office visit. Follow up appt scheduled for Thursday 10/04/2019. Patient voiced understanding; AVS printed and mailed to patient.

## 2019-10-02 NOTE — Assessment & Plan Note (Signed)
Normal coronaries 2006, Myoview negative July 2020 No recent symptoms

## 2019-10-02 NOTE — Progress Notes (Signed)
Virtual Visit via Telephone Note   This visit type was conducted due to national recommendations for restrictions regarding the COVID-19 Pandemic (e.g. social distancing) in an effort to limit this patient's exposure and mitigate transmission in our community.  Due to her co-morbid illnesses, this patient is at least at moderate risk for complications without adequate follow up.  This format is felt to be most appropriate for this patient at this time.  The patient did not have access to video technology/had technical difficulties with video requiring transitioning to audio format only (telephone).  All issues noted in this document were discussed and addressed.  No physical exam could be performed with this format.  Please refer to the patient's chart for her  consent to telehealth for [facility].   Date:  10/02/2019   ID:  [name], DOB 09-08-1940, MRN [id number]  Patient Location: Home Provider Location: Office  PCP:  Sharilyn Sites, MD  Cardiologist:  Quay Burow, MD  Electrophysiologist:  None   Evaluation Performed:  Follow-Up Visit  Chief Complaint:  none  History of Present Illness:    [name] is a 79 y.o. female with a history of breast cancer, status post surgery without radiation or chemotherapy in 2018, DJD status post bilateral hip in '06, right knee replacement in2016,andLt knee DJD.  Prior to her hip surgery in 2006 she had an abnormal Myoview.  This was followed by diagnostic catheterization by Dr. Rex Kras which revealed normal coronaries.    She was referred to Dr. Gwenlyn Found in June 2047for lower extremity edema. Dr. Gwenlyn Found addedadiuretic. Echocardiogram 03/03/2018 revealed an EF of 60 to 65%. She had grade 2 diastolic dysfunction. She was seen in the office in July 2020 and complained of chest pain.  Myoview done 04/04/2019 and was low risk.   She was contacted today for follow up.  She still has issues with her Lt knee but is afraid to have this  fixed because she is the primary caretaker for her `61 y/o mother.  The patient does use a cane or walker at times if need.  Her B/P has been running high but she is not sure if she is using her wrist cuff correctly.  I suggested she come to the office later this week with her cuff and I'll evaluate her then.    Past Medical History:  Diagnosis Date  . Arthritis   . Bladder spasms    "spastic bladder" wears pads  . Cancer ([place])    Skin cancer -scalp and eyebrow"basal cell"  . Complication of anesthesia    woke up too early from a surgery  . GERD (gastroesophageal reflux disease)    occ. frequent "hiccoughs" after "eating, indigestion"  . Hyperlipemia   . Hypertension   . Hypothyroidism   . Trigger finger, left    alternate fingers affected. -Occ. shooting pain.  . Wears glasses    Past Surgical History:  Procedure Laterality Date  . ABDOMINAL HYSTERECTOMY  1989  . APPENDECTOMY    . CARDIAC CATHETERIZATION  2006   normal-done for work up pre hip surgeries  . CARPAL TUNNEL RELEASE Left   . CHOLECYSTECTOMY  1997  . JOINT REPLACEMENT     BTHA  . POLYPECTOMY Bilateral 06/26/2013   Procedure: BILATERAL EXCISION OF NASAL POLYPS ;  Surgeon: Ascencion Dike, MD;  Location: [facility];  Service: ENT;  Laterality: Bilateral;  . SEPTOPLASTY N/A 06/26/2013   Procedure: AND SEPTOPLASTY;  Surgeon: Ascencion Dike,  MD;  Location: [place];  Service: ENT;  Laterality: N/A;  . THYROIDECTOMY  1994  . TONSILLECTOMY    . TOTAL HIP ARTHROPLASTY  2006   right  . TOTAL HIP ARTHROPLASTY  2006   left  . TOTAL KNEE ARTHROPLASTY Right 12/13/2014   Procedure: RIGHT TOTAL KNEE ARTHROPLASTY;  Surgeon: Gaynelle Arabian, MD;  Location: [facility];  Service: Orthopedics;  Laterality: Right;     Current Meds  Medication Sig  . anastrozole (ARIMIDEX) 1 MG tablet Take 1 mg by mouth daily.  Marland Kitchen aspirin EC 325 MG tablet Take 1 tablet (325 mg total) by mouth daily.  . Calcium Citrate (CITRACAL  PO) Take 2 tablets by mouth 2 (two) times daily.   . Cholecalciferol (VITAMIN D) 50 MCG (2000 UT) CAPS Take 1 capsule by mouth 2 (two) times daily.  . furosemide (LASIX) 20 MG tablet Take 20-40 mg by mouth as needed.  . hydrochlorothiazide (MICROZIDE) 12.5 MG capsule TAKE 1 CAPSULE BY MOUTH EVERY DAY  . levothyroxine (SYNTHROID, LEVOTHROID) 125 MCG tablet Take 125 mcg by mouth daily before breakfast.  . losartan (COZAAR) 50 MG tablet Take 1 tablet (50 mg total) by mouth daily.  . metoprolol tartrate (LOPRESSOR) 50 MG tablet Take 50 mg by mouth 2 (two) times daily.  . Multiple Vitamins-Minerals (PRESERVISION AREDS 2 PO) Take 1 tablet by mouth 2 (two) times daily.  Marland Kitchen oxybutynin (DITROPAN) 5 MG tablet Take 5 mg by mouth 2 (two) times daily.  . simvastatin (ZOCOR) 10 MG tablet Take 10 mg by mouth daily.     Allergies:   Feldene [piroxicam], Ivp dye [iodinated diagnostic agents], Naproxen, Sulfa antibiotics, and Vicodin [hydrocodone-acetaminophen]   Social History   Tobacco Use  . Smoking status: Never Smoker  . Smokeless tobacco: Never Used  Substance Use Topics  . Alcohol use: No  . Drug use: No     Family Hx: The patient's family history is not on file.  ROS:   Please see the history of present illness.    All other systems reviewed and are negative.   Prior CV studies:   The following studies were reviewed today: Myoview 04/04/2019  Labs/Other Tests and Data Reviewed:    EKG:  An ECG dated 11/03/2018 was personally reviewed today and demonstrated:  NSR, SB 58  Recent Labs: 11/03/2018: BUN 24; Creatinine, Ser 0.80; Potassium 4.8; Sodium 143   Recent Lipid Panel No results found for: CHOL, TRIG, HDL, CHOLHDL, LDLCALC, LDLDIRECT  Wt Readings from Last 3 Encounters:  10/02/19 212 lb (96.2 kg)  08/27/19 212 lb (96.2 kg)  05/31/19 212 lb (96.2 kg)     Objective:    Vital Signs:  BP (!) 147/99   Pulse (!) 53   Ht 5\' 3"  (1.6 m)   Wt 212 lb (96.2 kg)   BMI 37.55 kg/m     VITAL SIGNS:  reviewed  ASSESSMENT & PLAN:    Essential hypertension I have asked her to come in later this week for a B/P check  History of chest pain- Normal coronaries 2006, negative Myoview July 2020  History of breast cancer Surgery in March 2018- no radiation or chemotherapy (on Arimidex).  Obesity (BMI 30-39.9) BMI 38  OA (osteoarthritis) of knee She has Rt knee DJD- Dr Wynelle Link following  Family history of heart disease Pt's mother (76 y/o) is a patient of Dr Kennon Holter  COVID-19 Education: The signs and symptoms of COVID-19 were discussed with the patient and how to seek  care for testing (follow up with PCP or arrange E-visit).  The importance of social distancing was discussed today.  Time:   Today, I have spent 10 minutes with the patient with telehealth technology discussing the above problems.     Medication Adjustments/Labs and Tests Ordered: Current medicines are reviewed at length with the patient today.  Concerns regarding medicines are outlined above.   Tests Ordered: No orders of the defined types were placed in this encounter.   Medication Changes: No orders of the defined types were placed in this encounter.   Follow Up:  In Person Thursday 10/04/2019 with me- 2 pm  Signed, Kerin Ransom, PA-C  10/02/2019 2:20 PM    [place]

## 2019-10-02 NOTE — Assessment & Plan Note (Signed)
She has Lt knee DJD- Dr Wynelle Link following

## 2019-10-02 NOTE — Assessment & Plan Note (Signed)
B/P running high though she did have a reading of 102 /66 as well recently

## 2019-10-04 ENCOUNTER — Other Ambulatory Visit: Payer: Self-pay

## 2019-10-04 ENCOUNTER — Ambulatory Visit (INDEPENDENT_AMBULATORY_CARE_PROVIDER_SITE_OTHER): Payer: Medicare Other | Admitting: Cardiology

## 2019-10-04 ENCOUNTER — Encounter: Payer: Self-pay | Admitting: Cardiology

## 2019-10-04 VITALS — BP 148/92 | HR 58 | Temp 97.7°F | Ht 63.0 in | Wt 212.0 lb

## 2019-10-04 DIAGNOSIS — I10 Essential (primary) hypertension: Secondary | ICD-10-CM

## 2019-10-04 DIAGNOSIS — M1711 Unilateral primary osteoarthritis, right knee: Secondary | ICD-10-CM

## 2019-10-04 DIAGNOSIS — Z87898 Personal history of other specified conditions: Secondary | ICD-10-CM | POA: Diagnosis not present

## 2019-10-04 DIAGNOSIS — Z8249 Family history of ischemic heart disease and other diseases of the circulatory system: Secondary | ICD-10-CM

## 2019-10-04 DIAGNOSIS — Z853 Personal history of malignant neoplasm of breast: Secondary | ICD-10-CM | POA: Diagnosis not present

## 2019-10-04 NOTE — Progress Notes (Signed)
Cardiology Office Note:    Date:  10/04/2019   ID:  [name], DOB 09-Nov-1940, MRN [id number]  PCP:  Sharilyn Sites, MD  Cardiologist:  Quay Burow, MD  Electrophysiologist:  None   Referring MD: Sharilyn Sites, MD   No chief complaint on file. B/P follow up  History of Present Illness:    [name] is a delightful  79 y.o. female with a hx of a history of breast cancer, status post surgery without radiation or chemotherapy in 2018, DJD status post bilateral hip in '06,right knee replacement in2016,andLt knee DJD.Prior to her hip surgery in 2006 she had an abnormal Myoview. This was followed by diagnostic catheterization by Dr. Rex Kras which revealed normal coronaries.   She was referred to Dr. Gwenlyn Found in June 2028for lower extremity edema. Dr. Gwenlyn Found addedadiuretic. Echocardiogram6/28/2019revealed an EF of 60 to 65%. She had grade 2 diastolic dysfunction. Shewas seen in the officein July 2020 and complained of chest pain.  Myoview done 04/04/2019 and was low risk.   She was contacted earlier this week for a virtual visit.  Her B/P had been running high but she was not sure if she is using her wrist cuff correctly.  I suggested she come to the office with her cuff and for me to further evaluate her.   Today her B/P in the office with a large cuff was 146/84.  Her B/P by wrist cuff was initially 95/68 but when I had her extend her arm straight and re do it with wrist cuff it was 144/84.  She admits she could do better with low sodium diet.   Past Medical History:  Diagnosis Date  . Arthritis   . Bladder spasms    "spastic bladder" wears pads  . Cancer ([place])    Skin cancer -scalp and eyebrow"basal cell"  . Complication of anesthesia    woke up too early from a surgery  . GERD (gastroesophageal reflux disease)    occ. frequent "hiccoughs" after "eating, indigestion"  . Hyperlipemia   . Hypertension   . Hypothyroidism   . Trigger finger, left    alternate  fingers affected. -Occ. shooting pain.  . Wears glasses     Past Surgical History:  Procedure Laterality Date  . ABDOMINAL HYSTERECTOMY  1989  . APPENDECTOMY    . CARDIAC CATHETERIZATION  2006   normal-done for work up pre hip surgeries  . CARPAL TUNNEL RELEASE Left   . CHOLECYSTECTOMY  1997  . JOINT REPLACEMENT     BTHA  . POLYPECTOMY Bilateral 06/26/2013   Procedure: BILATERAL EXCISION OF NASAL POLYPS ;  Surgeon: Ascencion Dike, MD;  Location: [facility];  Service: ENT;  Laterality: Bilateral;  . SEPTOPLASTY N/A 06/26/2013   Procedure: AND SEPTOPLASTY;  Surgeon: Ascencion Dike, MD;  Location: [facility];  Service: ENT;  Laterality: N/A;  . THYROIDECTOMY  1994  . TONSILLECTOMY    . TOTAL HIP ARTHROPLASTY  2006   right  . TOTAL HIP ARTHROPLASTY  2006   left  . TOTAL KNEE ARTHROPLASTY Right 12/13/2014   Procedure: RIGHT TOTAL KNEE ARTHROPLASTY;  Surgeon: Gaynelle Arabian, MD;  [facility];  Service: Orthopedics;  Laterality: Right;    Current Medications: Current Meds  Medication Sig  . anastrozole (ARIMIDEX) 1 MG tablet Take 1 mg by mouth daily.  Marland Kitchen aspirin EC 325 MG tablet Take 1 tablet (325 mg total) by mouth daily.  . Calcium Citrate (CITRACAL PO) Take 2 tablets  by mouth 2 (two) times daily.   . Cholecalciferol (VITAMIN D) 50 MCG (2000 UT) CAPS Take 1 capsule by mouth 2 (two) times daily.  . furosemide (LASIX) 20 MG tablet Take 20-40 mg by mouth as needed.  . hydrochlorothiazide (MICROZIDE) 12.5 MG capsule TAKE 1 CAPSULE BY MOUTH EVERY DAY  . levothyroxine (SYNTHROID, LEVOTHROID) 125 MCG tablet Take 125 mcg by mouth daily before breakfast.  . loperamide (IMODIUM) 2 MG capsule Take 1 capsule (2 mg total) by mouth 4 (four) times daily as needed for diarrhea or loose stools.  Marland Kitchen losartan (COZAAR) 50 MG tablet Take 1 tablet (50 mg total) by mouth daily.  . metoprolol tartrate (LOPRESSOR) 50 MG tablet Take 50 mg by mouth 2 (two) times daily.  . Multiple  Vitamins-Minerals (PRESERVISION AREDS 2 PO) Take 1 tablet by mouth 2 (two) times daily.  Marland Kitchen oxybutynin (DITROPAN) 5 MG tablet Take 5 mg by mouth 2 (two) times daily.  . simvastatin (ZOCOR) 10 MG tablet Take 10 mg by mouth daily.     Allergies:   Feldene [piroxicam], Ivp dye [iodinated diagnostic agents], Naproxen, Sulfa antibiotics, and Vicodin [hydrocodone-acetaminophen]   Social History   Socioeconomic History  . Marital status: Single    Spouse name: Not on file  . Number of children: Not on file  . Years of education: Not on file  . Highest education level: Not on file  Occupational History  . Not on file  Tobacco Use  . Smoking status: Never Smoker  . Smokeless tobacco: Never Used  Substance and Sexual Activity  . Alcohol use: No  . Drug use: No  . Sexual activity: Not Currently  Other Topics Concern  . Not on file  Social History Narrative  . Not on file   Social Determinants of Health   Financial Resource Strain:   . Difficulty of Paying Living Expenses: Not on file  Food Insecurity:   . Worried About Charity fundraiser in the Last Year: Not on file  . Ran Out of Food in the Last Year: Not on file  Transportation Needs:   . Lack of Transportation (Medical): Not on file  . Lack of Transportation (Non-Medical): Not on file  Physical Activity:   . Days of Exercise per Week: Not on file  . Minutes of Exercise per Session: Not on file  Stress:   . Feeling of Stress : Not on file  Social Connections:   . Frequency of Communication with Friends and Family: Not on file  . Frequency of Social Gatherings with Friends and Family: Not on file  . Attends Religious Services: Not on file  . Active Member of Clubs or Organizations: Not on file  . Attends Archivist Meetings: Not on file  . Marital Status: Not on file     Family History: The patient's family history is not on file.  ROS:   Please see the history of present illness.     All other systems  reviewed and are negative.  EKGs/Labs/Other Studies Reviewed:    The following studies were reviewed today:   EKG:  EKG is not ordered today.    Recent Labs: 11/03/2018: BUN 24; Creatinine, Ser 0.80; Potassium 4.8; Sodium 143  Recent Lipid Panel No results found for: CHOL, TRIG, HDL, CHOLHDL, VLDL, LDLCALC, LDLDIRECT  Physical Exam:    VS:  BP (!) 148/92   Pulse (!) 58   Temp 97.7 F (36.5 C) (Temporal)   Ht 5\' 3"  (1.6 m)  Wt 212 lb (96.2 kg)   SpO2 98%   BMI 37.55 kg/m     Wt Readings from Last 3 Encounters:  10/04/19 212 lb (96.2 kg)  10/02/19 212 lb (96.2 kg)  08/27/19 212 lb (96.2 kg)     GEN:  Overweight Caucasian female well developed in no acute distress HEENT: Normal NECK: No JVD CARDIAC: RRR, no murmurs, rubs, gallops RESPIRATORY:  Clear to auscultation without rales, wheezing or rhonchi  ABDOMEN: Soft, non-tender, non-distended MUSCULOSKELETAL:  No edema; No deformity  SKIN: Warm and dry NEUROLOGIC:  Alert and oriented x 3 PSYCHIATRIC:  Normal affect   ASSESSMENT:     Essential hypertension I have asked her to watch her sodium intake and monitor her B/P for the next few weeks.  I suspect she will need an additional agent (Amlodipine 5 mg) but she would like to try diet adjustment first.  History of chest pain- Normal coronaries 2006, negative Myoview July 2020  History of breast cancer Surgery in March 2018- no radiation or chemotherapy (on Arimidex).  Obesity (BMI 30-39.9) BMI 38  OA (osteoarthritis) of knee She has Rt knee DJD- Dr Wynelle Link following-on full dose ASA for DJD  Family history of heart disease Pt's mother (48 y/o) is a patient of Dr Kennon Holter  PLAN:    Virtual f/u with me in 5 weeks    Medication Adjustments/Labs and Tests Ordered: Current medicines are reviewed at length with the patient today.  Concerns regarding medicines are outlined above.  No orders of the defined types were placed in this encounter.  No  orders of the defined types were placed in this encounter.   Patient Instructions  Medication Instructions:  Your physician recommends that you continue on your current medications as directed. Please refer to the Current Medication list given to you today. *If you need a refill on your cardiac medications before your next appointment, please call your pharmacy*  Lab Work: None  If you have labs (blood work) drawn today and your tests are completely normal, you will receive your results only by: Marland Kitchen MyChart Message (if you have MyChart) OR . A paper copy in the mail If you have any lab test that is abnormal or we need to change your treatment, we will call you to review the results.  Testing/Procedures: None   Follow-Up: At [facility], you and your health needs are our priority.  As part of our continuing mission to provide you with exceptional heart care, we have created designated Provider Care Teams.  These Care Teams include your primary Cardiologist (physician) and Advanced Practice Providers (APPs -  Physician Assistants and Nurse Practitioners) who all work together to provide you with the care you need, when you need it.  Your next appointment:   5 week(s)  The format for your next appointment:   Virtual Visit   Provider:   Kerin Ransom, PA-C  Other Instructions CHECK YOUR BLOOD PRESSURE THREE TIMES A WEEK EAT A LOS SODIUM DIET SALTY 6 HANDOUT GIVEN    Signed, Kerin Ransom, PA-C  10/04/2019 2:43 PM    [place]

## 2019-10-04 NOTE — Patient Instructions (Addendum)
Medication Instructions:  Your physician recommends that you continue on your current medications as directed. Please refer to the Current Medication list given to you today. *If you need a refill on your cardiac medications before your next appointment, please call your pharmacy*  Lab Work: None  If you have labs (blood work) drawn today and your tests are completely normal, you will receive your results only by: Marland Kitchen MyChart Message (if you have MyChart) OR . A paper copy in the mail If you have any lab test that is abnormal or we need to change your treatment, we will call you to review the results.  Testing/Procedures: None   Follow-Up: At [facility], you and your health needs are our priority.  As part of our continuing mission to provide you with exceptional heart care, we have created designated Provider Care Teams.  These Care Teams include your primary Cardiologist (physician) and Advanced Practice Providers (APPs -  Physician Assistants and Nurse Practitioners) who all work together to provide you with the care you need, when you need it.  Your next appointment:   5 week(s)  The format for your next appointment:   Virtual Visit   Provider:   Kerin Ransom, PA-C  Other Instructions CHECK YOUR BLOOD PRESSURE THREE TIMES A WEEK EAT A LOW SODIUM DIET SALTY 6 HANDOUT GIVEN

## 2019-10-07 DIAGNOSIS — I10 Essential (primary) hypertension: Secondary | ICD-10-CM | POA: Diagnosis not present

## 2019-10-07 DIAGNOSIS — M1991 Primary osteoarthritis, unspecified site: Secondary | ICD-10-CM | POA: Diagnosis not present

## 2019-10-07 DIAGNOSIS — E039 Hypothyroidism, unspecified: Secondary | ICD-10-CM | POA: Diagnosis not present

## 2019-10-07 DIAGNOSIS — E7849 Other hyperlipidemia: Secondary | ICD-10-CM | POA: Diagnosis not present

## 2019-10-18 DIAGNOSIS — Z23 Encounter for immunization: Secondary | ICD-10-CM | POA: Diagnosis not present

## 2019-11-04 DIAGNOSIS — M1991 Primary osteoarthritis, unspecified site: Secondary | ICD-10-CM | POA: Diagnosis not present

## 2019-11-04 DIAGNOSIS — I10 Essential (primary) hypertension: Secondary | ICD-10-CM | POA: Diagnosis not present

## 2019-11-04 DIAGNOSIS — E039 Hypothyroidism, unspecified: Secondary | ICD-10-CM | POA: Diagnosis not present

## 2019-11-04 DIAGNOSIS — E7849 Other hyperlipidemia: Secondary | ICD-10-CM | POA: Diagnosis not present

## 2019-11-08 ENCOUNTER — Telehealth (INDEPENDENT_AMBULATORY_CARE_PROVIDER_SITE_OTHER): Payer: Medicare Other | Admitting: Cardiology

## 2019-11-08 ENCOUNTER — Encounter: Payer: Self-pay | Admitting: Cardiology

## 2019-11-08 VITALS — BP 150/77 | HR 65 | Wt 212.0 lb

## 2019-11-08 DIAGNOSIS — Z8249 Family history of ischemic heart disease and other diseases of the circulatory system: Secondary | ICD-10-CM

## 2019-11-08 DIAGNOSIS — Z79899 Other long term (current) drug therapy: Secondary | ICD-10-CM

## 2019-11-08 DIAGNOSIS — M1711 Unilateral primary osteoarthritis, right knee: Secondary | ICD-10-CM

## 2019-11-08 DIAGNOSIS — I10 Essential (primary) hypertension: Secondary | ICD-10-CM | POA: Diagnosis not present

## 2019-11-08 DIAGNOSIS — Z853 Personal history of malignant neoplasm of breast: Secondary | ICD-10-CM

## 2019-11-08 DIAGNOSIS — Z87898 Personal history of other specified conditions: Secondary | ICD-10-CM

## 2019-11-08 MED ORDER — LOSARTAN POTASSIUM 50 MG PO TABS: 100.0000 mg | ORAL_TABLET | Freq: Every day | ORAL | 3 refills | Status: DC

## 2019-11-08 NOTE — Progress Notes (Signed)
Virtual Visit via Telephone Note   This visit type was conducted due to national recommendations for restrictions regarding the COVID-19 Pandemic (e.g. social distancing) in an effort to limit this patient's exposure and mitigate transmission in our community.  Due to her co-morbid illnesses, this patient is at least at moderate risk for complications without adequate follow up.  This format is felt to be most appropriate for this patient at this time.  The patient did not have access to video technology/had technical difficulties with video requiring transitioning to audio format only (telephone).  All issues noted in this document were discussed and addressed.  No physical exam could be performed with this format.  Please refer to the patient's chart for her  consent to telehealth for [facility].   Date:  11/08/2019   ID:  [name], DOB 1941-06-25, MRN [id number]  Patient Location: Home Provider Location: Home  PCP:  Sharilyn Sites, MD  Cardiologist:  Quay Burow, MD  Electrophysiologist:  None   Evaluation Performed:  Follow-Up Visit  Chief Complaint:  None- f/u b/p  History of Present Illness:    [name] is a 79 y.o. Mccormick with a hx of a history of breast cancer, status post surgery without radiation or chemotherapy in 2018, DJD status post bilateral hip in '06,right knee replacement in2016,andLt knee DJD.Prior to her hip surgery in 2006 she had an abnormal Myoview. This was followed by diagnostic catheterization by Dr. Rex Kras which revealed normal coronaries.   She was referred to Dr. Gwenlyn Found in June 2018for lower extremity edema. Dr. Gwenlyn Found addedadiuretic. Echo6/28/2019revealed an EF of 60 to 65%. She had grade 2 diastolic dysfunction. Shewas seen in the officein July 2020 and complained of chest pain. Myoview done 04/04/2019 and was low risk.   She was seen in the office 10/04/2019.  Her B/P had been running high but she was not sure if she is  using her wrist cuff correctly. I suggested she come to the office with her cuff and for me to further evaluate her. Her B/P in the office with a large cuff was 146/84.  Her B/P by wrist cuff was initially 95/68 but when I had her extend her arm straight and re do it with wrist cuff it was 144/84.  I asked her to watch her sodium intake and monitor her B/P for the next few weeks.  I suspected she will need a medication adjustment but she wanted to try diet adjustment first.  She was contacted today for follow up.  Her systolic B/P runs [id number].  She has occasional diastolic readings in the 0000000.   The patient does not have symptoms concerning for COVID-19 infection (fever, chills, cough, or new shortness of breath).    Past Medical History:  Diagnosis Date  . Arthritis   . Bladder spasms    "spastic bladder" wears pads  . Cancer ([place])    Skin cancer -scalp and eyebrow"basal cell"  . Complication of anesthesia    woke up too early from a surgery  . GERD (gastroesophageal reflux disease)    occ. frequent "hiccoughs" after "eating, indigestion"  . Hyperlipemia   . Hypertension   . Hypothyroidism   . Trigger finger, left    alternate fingers affected. -Occ. shooting pain.  . Wears glasses    Past Surgical History:  Procedure Laterality Date  . ABDOMINAL HYSTERECTOMY  1989  . APPENDECTOMY    . CARDIAC CATHETERIZATION  2006   normal-done for work up pre  hip surgeries  . CARPAL TUNNEL RELEASE Left   . CHOLECYSTECTOMY  1997  . JOINT REPLACEMENT     BTHA  . POLYPECTOMY Bilateral 06/26/2013   Procedure: BILATERAL EXCISION OF NASAL POLYPS ;  Surgeon: Ascencion Dike, MD;  Location: [place];  Service: ENT;  Laterality: Bilateral;  . SEPTOPLASTY N/A 06/26/2013   Procedure: AND SEPTOPLASTY;  Surgeon: Ascencion Dike, MD;  Location: [place];  Service: ENT;  Laterality: N/A;  . THYROIDECTOMY  1994  . TONSILLECTOMY    . TOTAL HIP ARTHROPLASTY  2006   right  . TOTAL  HIP ARTHROPLASTY  2006   left  . TOTAL KNEE ARTHROPLASTY Right 12/13/2014   Procedure: RIGHT TOTAL KNEE ARTHROPLASTY;  Surgeon: Gaynelle Arabian, MD;  Location: [facility];  Service: Orthopedics;  Laterality: Right;     Current Meds  Medication Sig  . anastrozole (ARIMIDEX) 1 MG tablet Take 1 mg by mouth daily.  Marland Kitchen aspirin EC 325 MG tablet Take 1 tablet (325 mg total) by mouth daily.  . Calcium Citrate (CITRACAL PO) Take 2 tablets by mouth 2 (two) times daily.   . Cholecalciferol (VITAMIN D) 50 MCG (2000 UT) CAPS Take 1 capsule by mouth 2 (two) times daily.  . hydrochlorothiazide (MICROZIDE) 12.5 MG capsule TAKE 1 CAPSULE BY MOUTH EVERY DAY  . levothyroxine (SYNTHROID, LEVOTHROID) 125 MCG tablet Take 125 mcg by mouth daily before breakfast.  . loperamide (IMODIUM) 2 MG capsule Take 1 capsule (2 mg total) by mouth 4 (four) times daily as needed for diarrhea or loose stools.  Marland Kitchen losartan (COZAAR) 50 MG tablet Take 1 tablet (50 mg total) by mouth daily.  . metoprolol tartrate (LOPRESSOR) 50 MG tablet Take 50 mg by mouth 2 (two) times daily.  . Multiple Vitamins-Minerals (PRESERVISION AREDS 2 PO) Take 1 tablet by mouth 2 (two) times daily.  Marland Kitchen oxybutynin (DITROPAN) 5 MG tablet Take 5 mg by mouth 2 (two) times daily.  . simvastatin (ZOCOR) 10 MG tablet Take 10 mg by mouth daily.     Allergies:   Feldene [piroxicam], Ivp dye [iodinated diagnostic agents], Naproxen, Sulfa antibiotics, and Vicodin [hydrocodone-acetaminophen]   Social History   Tobacco Use  . Smoking status: Never Smoker  . Smokeless tobacco: Never Used  Substance Use Topics  . Alcohol use: No  . Drug use: No     Family Hx: The patient's family history is not on file.  ROS:   Please see the history of present illness.    All other systems reviewed and are negative.   Prior CV studies:   The following studies were reviewed today: Myoview July 2020  Labs/Other Tests and Data Reviewed:    EKG:  No ECG reviewed.  Recent  Labs: No results found for requested labs within last 8760 hours.   Recent Lipid Panel No results found for: CHOL, TRIG, HDL, CHOLHDL, LDLCALC, LDLDIRECT  Wt Readings from Last 3 Encounters:  11/08/19 212 lb (96.2 kg)  10/04/19 212 lb (96.2 kg)  10/02/19 212 lb (96.2 kg)     Objective:    Vital Signs:  BP (!) 150/77   Pulse 65   Wt 212 lb (96.2 kg)   BMI 37.55 kg/m    VITAL SIGNS:  reviewed  ASSESSMENT & PLAN:    Essential hypertension I decided to increase her Losartan today as opposed to adding a 4th agent. She needs a BMP next week and a f/u virtual visit in two weeks.  History  of chest pain- Normal coronaries 2006, negative Myoview July 2020  History of breast cancer Surgery in March 2018- no radiation or chemotherapy (on Arimidex).  Obesity (BMI 30-39.9) BMI 38  OA (osteoarthritis) of knee She has Rt knee DJD- Dr Wynelle Link following-on full dose ASA for DJD  Family history of heart disease Pt's mother (32 y/o) is a patient of Dr Kennon Holter  COVID-19 Education: The signs and symptoms of COVID-19 were discussed with the patient and how to seek care for testing (follow up with PCP or arrange E-visit).  The importance of social distancing was discussed today.  Time:   Today, I have spent 10 minutes with the patient with telehealth technology discussing the above problems.     Medication Adjustments/Labs and Tests Ordered: Current medicines are reviewed at length with the patient today.  Concerns regarding medicines are outlined above.   Tests Ordered: No orders of the defined types were placed in this encounter.   Medication Changes: No orders of the defined types were placed in this encounter.   Follow Up:  Virtual Visit  march 18th with me  Signed, Kerin Ransom, PA-C  11/08/2019 11:04 AM    [place]

## 2019-11-08 NOTE — Patient Instructions (Addendum)
Medication Instructions:  INCREASE LOSARTAN 100MG  DAILY-YOU MAY TAKE 2 MORE TODAY AND 4 DAILY UNTIL THEY ARE DONE If you need a refill on your cardiac medications before your next appointment, please call your pharmacy.  Labwork: BMET ON [facility] 11-14-2019 HERE IN OUR OFFICE AT [facility]      You will NOT need to fast   If you have labs (blood work) drawn today and your tests are completely normal, you will receive your results only by: Marland Kitchen MyChart Message (if you have MyChart) OR A paper copy in the mail If you have any lab test that is abnormal or we need to change your treatment, we will call you to review these results. You may go to any [facility] lab that is convenient for you however, we do have a lab in our office that is able to assist you. You do NOT need an appointment for our lab. Once in our office in our office lobby there is a podium where you sign-in and ring the doorbell to alert [place] that you are here. Lab is open 8:00am and closes at 4:00pm; closes for lunch from 12:45 - 1:45pm. PLEASE BRING A COPY OF YOUR INSURANCE CARD WITH YOU.  Follow-Up: 11-22-2019 @ 1145am AS DISCUSSED  Virtual Visit  Kerin Ransom, [place].  11-22-2019 @11 :45am    At [facility], you and your health needs are our priority.  As part of our continuing mission to provide you with exceptional heart care, we have created designated Provider Care Teams.  These Care Teams include your primary Cardiologist (physician) and Advanced Practice Providers (APPs -  Physician Assistants and Nurse Practitioners) who all work together to provide you with the care you need, when you need it.  Reduce your risk of getting COVID-19 With your heart disease it is especially important for people at increased risk of severe illness from COVID-19, and those who live with them, to protect themselves from getting COVID-19. The best way to protect yourself and to help reduce the spread of the virus that causes COVID-19 is to: Marland Kitchen Limit your  interactions with other people as much as possible. . Take COVID-19 when you do interact with others. If you start feeling sick and think you may have COVID-19, get in touch with your healthcare provider within 24 hours.  Thank you for choosing [facility] at [facility]!!

## 2019-11-14 DIAGNOSIS — Z79899 Other long term (current) drug therapy: Secondary | ICD-10-CM | POA: Diagnosis not present

## 2019-11-14 DIAGNOSIS — I10 Essential (primary) hypertension: Secondary | ICD-10-CM | POA: Diagnosis not present

## 2019-11-15 LAB — BASIC METABOLIC PANEL
BUN/Creatinine Ratio: 26 (ref 12–28)
BUN: 22 mg/dL (ref 8–27)
CO2: 25 mmol/L (ref 20–29)
Calcium: 9.6 mg/dL (ref 8.7–10.3)
Chloride: 103 mmol/L (ref 96–106)
Creatinine, Ser: 0.85 mg/dL (ref 0.57–1.00)
GFR calc Af Amer: 76 mL/min/{1.73_m2} (ref >59)
GFR calc non Af Amer: 66 mL/min/{1.73_m2} (ref >59)
Glucose: 81 mg/dL (ref 65–99)
Potassium: 4.2 mmol/L (ref 3.5–5.2)
Sodium: 142 mmol/L (ref 134–144)

## 2019-11-16 DIAGNOSIS — Z23 Encounter for immunization: Secondary | ICD-10-CM | POA: Diagnosis not present

## 2019-11-22 ENCOUNTER — Telehealth (INDEPENDENT_AMBULATORY_CARE_PROVIDER_SITE_OTHER): Payer: Medicare Other | Admitting: Cardiology

## 2019-11-22 ENCOUNTER — Encounter: Payer: Self-pay | Admitting: Cardiology

## 2019-11-22 VITALS — BP 150/76 | HR 69 | Ht 63.0 in | Wt 214.4 lb

## 2019-11-22 DIAGNOSIS — Z853 Personal history of malignant neoplasm of breast: Secondary | ICD-10-CM

## 2019-11-22 DIAGNOSIS — I10 Essential (primary) hypertension: Secondary | ICD-10-CM

## 2019-11-22 DIAGNOSIS — Z87898 Personal history of other specified conditions: Secondary | ICD-10-CM

## 2019-11-22 DIAGNOSIS — M1712 Unilateral primary osteoarthritis, left knee: Secondary | ICD-10-CM

## 2019-11-22 MED ORDER — LOSARTAN POTASSIUM 50 MG PO TABS: 50.0000 mg | ORAL_TABLET | Freq: Two times a day (BID) | ORAL | 1 refills | Status: DC

## 2019-11-22 NOTE — Patient Instructions (Signed)
Medication Instructions:   Split your Losartan doses taking 50 mg (1 tab) every morning and 50 mg (1 tab) every evening.  *If you need a refill on your cardiac medications before your next appointment, please call your pharmacy*   Follow-Up: At [facility], you and your health needs are our priority.  As part of our continuing mission to provide you with exceptional heart care, we have created designated Provider Care Teams.  These Care Teams include your primary Cardiologist (physician) and Advanced Practice Providers (APPs -  Physician Assistants and Nurse Practitioners) who all work together to provide you with the care you need, when you need it.  We recommend signing up for the patient portal called "MyChart".  Sign up information is provided on this After Visit Summary.  MyChart is used to connect with patients for Virtual Visits (Telemedicine).  Patients are able to view lab/test results, encounter notes, upcoming appointments, etc.  Non-urgent messages can be sent to your provider as well.   To learn more about what you can do with MyChart, go to [email or web address].    Your next appointment:   Thursday, 12/06/19 at 12:00 PM  The format for your next appointment:   Virtual Visit   Provider:   Kerin Ransom, PA-C   Other Instructions Your physician has requested that you regularly monitor and record your blood pressure readings at home. Please use the same machine at different times of the day, 1 hour after taking your blood pressure medications to check your readings and record them to bring to your follow-up visit.

## 2019-11-22 NOTE — Progress Notes (Signed)
Virtual Visit via Telephone Note   This visit type was conducted due to national recommendations for restrictions regarding the COVID-19 Pandemic (e.g. social distancing) in an effort to limit this patient's exposure and mitigate transmission in our community.  Due to her co-morbid illnesses, this patient is at least at moderate risk for complications without adequate follow up.  This format is felt to be most appropriate for this patient at this time.  The patient did not have access to video technology/had technical difficulties with video requiring transitioning to audio format only (telephone).  All issues noted in this document were discussed and addressed.  No physical exam could be performed with this format.  Please refer to the patient's chart for her  consent to telehealth for [facility].   The patient was identified using 2 identifiers.  Date:  11/22/2019   ID:  [name], DOB [birth year], MRN [id number]  Patient Location: Home Provider Location: Home  PCP:  Sharilyn Sites, MD  Cardiologist:  Quay Burow, MD  Electrophysiologist:  None   Evaluation Performed:  Follow-Up Visit  Chief Complaint:  none  History of Present Illness:    [name] is a 79 y.o. female with a history of breast cancer, status post surgery without radiation or chemotherapy in 2018, DJD status post bilateral hip in '06,right knee replacement in2016,andLt knee DJD.Prior to her hip surgery in 2006 she had an abnormal Myoview. This was followed by diagnostic catheterization by Dr. Rex Kras which revealed normal coronaries.   She was referred to Dr. Gwenlyn Found in June 2047for lower extremity edema. Dr. Gwenlyn Found addedadiuretic. Echo6/28/2019revealed an EF of 60 to 65%. She had grade 2 diastolic dysfunction. Shewas seen in the officein July 2020 and complained of chest pain. Myoview done 04/04/2019 and was low risk.  She was seen in the office in jan 2021 and her B/P had been running  high. Her Losartan was increased to 100 mg daily and she was contacted today for follow up. She keeps unusual hours- sleeps late and is up till 2-3am.  Her B/P late at night is higher than during the day.  I asked her to split the Losartan to 50 mg BID and monitor her B/P over the next week or two.  She is to take her B/P once a day-at different times of the day but she should till one hour after taking her medications.  I'll f/u her up in two weeks.  The patient does not have symptoms concerning for COVID-19 infection (fever, chills, cough, or new shortness of breath).    Past Medical History:  Diagnosis Date  . Arthritis   . Bladder spasms    "spastic bladder" wears pads  . Cancer ([place])    Skin cancer -scalp and eyebrow"basal cell"  . Complication of anesthesia    woke up too early from a surgery  . GERD (gastroesophageal reflux disease)    occ. frequent "hiccoughs" after "eating, indigestion"  . Hyperlipemia   . Hypertension   . Hypothyroidism   . Trigger finger, left    alternate fingers affected. -Occ. shooting pain.  . Wears glasses    Past Surgical History:  Procedure Laterality Date  . ABDOMINAL HYSTERECTOMY  1989  . APPENDECTOMY    . CARDIAC CATHETERIZATION  2006   normal-done for work up pre hip surgeries  . CARPAL TUNNEL RELEASE Left   . CHOLECYSTECTOMY  1997  . JOINT REPLACEMENT     BTHA  . POLYPECTOMY Bilateral 06/26/2013  Procedure: BILATERAL EXCISION OF NASAL POLYPS ;  Surgeon: Ascencion Dike, MD;  Location: [place];  Service: ENT;  Laterality: Bilateral;  . SEPTOPLASTY N/A 06/26/2013   Procedure: AND SEPTOPLASTY;  Surgeon: Ascencion Dike, MD;  Location: [place];  Service: ENT;  Laterality: N/A;  . THYROIDECTOMY  1994  . TONSILLECTOMY    . TOTAL HIP ARTHROPLASTY  2006   right  . TOTAL HIP ARTHROPLASTY  2006   left  . TOTAL KNEE ARTHROPLASTY Right 12/13/2014   Procedure: RIGHT TOTAL KNEE ARTHROPLASTY;  Surgeon: Gaynelle Arabian, MD;   Location: [facility];  Service: Orthopedics;  Laterality: Right;     Current Meds  Medication Sig  . anastrozole (ARIMIDEX) 1 MG tablet Take 1 mg by mouth daily.  Marland Kitchen aspirin EC 325 MG tablet Take 1 tablet (325 mg total) by mouth daily.  . Calcium Citrate (CITRACAL PO) Take 2 tablets by mouth 2 (two) times daily.   . Cholecalciferol (VITAMIN D) 50 MCG (2000 UT) CAPS Take 1 capsule by mouth 2 (two) times daily.  . furosemide (LASIX) 20 MG tablet Take 20-40 mg by mouth as needed.  . hydrochlorothiazide (MICROZIDE) 12.5 MG capsule TAKE 1 CAPSULE BY MOUTH EVERY DAY  . levothyroxine (SYNTHROID, LEVOTHROID) 125 MCG tablet Take 125 mcg by mouth daily before breakfast.  . loperamide (IMODIUM) 2 MG capsule Take 1 capsule (2 mg total) by mouth 4 (four) times daily as needed for diarrhea or loose stools.  Marland Kitchen losartan (COZAAR) 50 MG tablet Take 2 tablets (100 mg total) by mouth daily.  . metoprolol tartrate (LOPRESSOR) 50 MG tablet Take 50 mg by mouth 2 (two) times daily.  . Multiple Vitamins-Minerals (PRESERVISION AREDS 2 PO) Take 1 tablet by mouth 2 (two) times daily.  Marland Kitchen oxybutynin (DITROPAN) 5 MG tablet Take 5 mg by mouth 2 (two) times daily.  . simvastatin (ZOCOR) 10 MG tablet Take 10 mg by mouth daily.     Allergies:   Feldene [piroxicam], Ivp dye [iodinated diagnostic agents], Naproxen, Sulfa antibiotics, and Vicodin [hydrocodone-acetaminophen]   Social History   Tobacco Use  . Smoking status: Never Smoker  . Smokeless tobacco: Never Used  Substance Use Topics  . Alcohol use: No  . Drug use: No     Family Hx: The patient's family history is not on file.  ROS:   Please see the history of present illness.    All other systems reviewed and are negative.   Prior CV studies:   The following studies were reviewed today:   Labs/Other Tests and Data Reviewed:    EKG:  No ECG reviewed.  Recent Labs: 11/14/2019: BUN 22; Creatinine, Ser 0.85; Potassium 4.2; Sodium 142   Recent Lipid  Panel No results found for: CHOL, TRIG, HDL, CHOLHDL, LDLCALC, LDLDIRECT  Wt Readings from Last 3 Encounters:  11/22/19 214 lb 6.4 oz (97.3 kg)  11/08/19 212 lb (96.2 kg)  10/04/19 212 lb (96.2 kg)     Objective:    Vital Signs:  BP (!) 150/76   Pulse 69   Ht 5\' 3"  (1.6 m)   Wt 214 lb 6.4 oz (97.3 kg)   BMI 37.98 kg/m    VITAL SIGNS:  reviewed  ASSESSMENT & PLAN:    Essential hypertension Change Losartan to 50 mg BID- monitor B/P as directed  History of chest pain- Normal coronaries 2006, negative Myoview July 2020  History of breast cancer Surgery in March 2018- no radiation or chemotherapy (on Arimidex).  Obesity (BMI 30-39.9) BMI 38  OA (osteoarthritis) of knee She has Rt knee DJD- Dr Wynelle Link following-on full dose ASA for DJD  COVID-19 Education: The signs and symptoms of COVID-19 were discussed with the patient and how to seek care for testing (follow up with PCP or arrange E-visit).  The importance of social distancing was discussed today.  Time:   Today, I have spent 10 minutes with the patient with telehealth technology discussing the above problems.     Medication Adjustments/Labs and Tests Ordered: Current medicines are reviewed at length with the patient today.  Concerns regarding medicines are outlined above.   Tests Ordered: No orders of the defined types were placed in this encounter.   Medication Changes: No orders of the defined types were placed in this encounter.   Follow Up:  Virtual Visit  two weeks with me  Signed, Kerin Ransom, PA-C  11/22/2019 12:06 PM    [place]

## 2019-11-22 NOTE — Addendum Note (Signed)
Addended by: Therisa Doyne on: 11/22/2019 02:52 PM   Modules accepted: Orders

## 2019-11-22 NOTE — Addendum Note (Signed)
Addended by: Therisa Doyne on: 11/22/2019 03:17 PM   Modules accepted: Orders

## 2019-12-02 ENCOUNTER — Other Ambulatory Visit: Payer: Self-pay | Admitting: Cardiovascular Disease

## 2019-12-05 DIAGNOSIS — E7849 Other hyperlipidemia: Secondary | ICD-10-CM | POA: Diagnosis not present

## 2019-12-05 DIAGNOSIS — E039 Hypothyroidism, unspecified: Secondary | ICD-10-CM | POA: Diagnosis not present

## 2019-12-05 DIAGNOSIS — M1991 Primary osteoarthritis, unspecified site: Secondary | ICD-10-CM | POA: Diagnosis not present

## 2019-12-05 DIAGNOSIS — I10 Essential (primary) hypertension: Secondary | ICD-10-CM | POA: Diagnosis not present

## 2019-12-06 ENCOUNTER — Telehealth: Payer: Medicare Other | Admitting: Cardiology

## 2019-12-12 ENCOUNTER — Telehealth (INDEPENDENT_AMBULATORY_CARE_PROVIDER_SITE_OTHER): Payer: Medicare Other | Admitting: Cardiology

## 2019-12-12 ENCOUNTER — Encounter: Payer: Self-pay | Admitting: Cardiology

## 2019-12-12 VITALS — BP 148/64 | HR 68 | Wt 214.0 lb

## 2019-12-12 DIAGNOSIS — I10 Essential (primary) hypertension: Secondary | ICD-10-CM | POA: Diagnosis not present

## 2019-12-12 DIAGNOSIS — M1712 Unilateral primary osteoarthritis, left knee: Secondary | ICD-10-CM

## 2019-12-12 DIAGNOSIS — E669 Obesity, unspecified: Secondary | ICD-10-CM

## 2019-12-12 DIAGNOSIS — Z87898 Personal history of other specified conditions: Secondary | ICD-10-CM

## 2019-12-12 DIAGNOSIS — Z853 Personal history of malignant neoplasm of breast: Secondary | ICD-10-CM

## 2019-12-12 DIAGNOSIS — Z6838 Body mass index (BMI) 38.0-38.9, adult: Secondary | ICD-10-CM | POA: Diagnosis not present

## 2019-12-12 DIAGNOSIS — Z8249 Family history of ischemic heart disease and other diseases of the circulatory system: Secondary | ICD-10-CM

## 2019-12-12 DIAGNOSIS — M1711 Unilateral primary osteoarthritis, right knee: Secondary | ICD-10-CM

## 2019-12-12 DIAGNOSIS — E782 Mixed hyperlipidemia: Secondary | ICD-10-CM

## 2019-12-12 NOTE — Progress Notes (Signed)
Virtual Visit via Telephone Note   This visit type was conducted due to national recommendations for restrictions regarding the COVID-19 Pandemic (e.g. social distancing) in an effort to limit this patient's exposure and mitigate transmission in our community.  Due to her co-morbid illnesses, this patient is at least at moderate risk for complications without adequate follow up.  This format is felt to be most appropriate for this patient at this time.  The patient did not have access to video technology/had technical difficulties with video requiring transitioning to audio format only (telephone).  All issues noted in this document were discussed and addressed.  No physical exam could be performed with this format.  Please refer to the patient's chart for her  consent to telehealth for [facility].   The patient was identified using 2 identifiers.  Date:  12/12/2019   ID:  [name], DOB Jul 07, 1941, MRN [id number]  Patient Location: Home Provider Location: Home  PCP:  Sharilyn Sites, MD  Cardiologist:  Quay Burow, MD  Electrophysiologist:  None   Evaluation Performed:  Follow-Up Visit  Chief Complaint:  none  History of Present Illness:    [name] is a 79 y.o. female with a history of breast cancer, status post surgery without radiation or chemotherapy in 2018, DJD status post bilateral hip in '06,right knee replacement in2016,andLt knee DJD.Prior to her hip surgery in 2006 she had an abnormal Myoview. This was followed by diagnostic catheterization by Dr. Rex Kras which revealed normal coronaries.   She was referred to Dr. Gwenlyn Found in June 2081for lower extremity edema. Dr. Gwenlyn Found addedadiuretic. Echo6/28/2019revealed an EF of 60 to 65%. She had grade 2 diastolic dysfunction. Shewas seen in the officein July 2020 and complained of chest pain. Myoview done 04/04/2019 and was low risk.  She was seen in the office in Jan 2021 and her B/P had been running  high. Her Losartan was increased to 100 mg daily and she was contacted 11/22/2019 for follow up. She keeps unusual hours- sleeps late and is up till 2-3am.  Her B/P late at night is higher than during the day.  I asked her to split the Losartan to 50 mg BID and monitor her B/P over the next week or two. She was contacted today for follow up.  Her B/P seems to be better controlled on her current regimen. She tells me though that her insurance company is giving her a hard time about the Losartan as ordered.  I asked her to get a list of ARB medications they do cover and we can work with that, or she'll have to buy Losartan OTC.   The patient does not have symptoms concerning for COVID-19 infection (fever, chills, cough, or new shortness of breath).    Past Medical History:  Diagnosis Date  . Arthritis   . Bladder spasms    "spastic bladder" wears pads  . Cancer ([place])    Skin cancer -scalp and eyebrow"basal cell"  . Complication of anesthesia    woke up too early from a surgery  . GERD (gastroesophageal reflux disease)    occ. frequent "hiccoughs" after "eating, indigestion"  . Hyperlipemia   . Hypertension   . Hypothyroidism   . Trigger finger, left    alternate fingers affected. -Occ. shooting pain.  . Wears glasses    Past Surgical History:  Procedure Laterality Date  . ABDOMINAL HYSTERECTOMY  1989  . APPENDECTOMY    . CARDIAC CATHETERIZATION  2006   normal-done for  work up pre hip surgeries  . CARPAL TUNNEL RELEASE Left   . CHOLECYSTECTOMY  1997  . JOINT REPLACEMENT     BTHA  . POLYPECTOMY Bilateral 06/26/2013   Procedure: BILATERAL EXCISION OF NASAL POLYPS ;  Surgeon: Ascencion Dike, MD;  Location: [place];  Service: ENT;  Laterality: Bilateral;  . SEPTOPLASTY N/A 06/26/2013   Procedure: AND SEPTOPLASTY;  Surgeon: Ascencion Dike, MD;  Location: [place];  Service: ENT;  Laterality: N/A;  . THYROIDECTOMY  1994  . TONSILLECTOMY    . TOTAL HIP  ARTHROPLASTY  2006   right  . TOTAL HIP ARTHROPLASTY  2006   left  . TOTAL KNEE ARTHROPLASTY Right 12/13/2014   Procedure: RIGHT TOTAL KNEE ARTHROPLASTY;  Surgeon: Gaynelle Arabian, MD;  [facility];  Service: Orthopedics;  Laterality: Right;     Current Meds  Medication Sig  . anastrozole (ARIMIDEX) 1 MG tablet Take 1 mg by mouth daily.  Marland Kitchen aspirin EC 325 MG tablet Take 1 tablet (325 mg total) by mouth daily.  . Calcium Citrate (CITRACAL PO) Take 2 tablets by mouth 2 (two) times daily.   . Cholecalciferol (VITAMIN D) 50 MCG (2000 UT) CAPS Take 1 capsule by mouth 2 (two) times daily.  . furosemide (LASIX) 20 MG tablet Take 20-40 mg by mouth as needed.  . hydrochlorothiazide (MICROZIDE) 12.5 MG capsule TAKE 1 CAPSULE BY MOUTH EVERY DAY  . levothyroxine (SYNTHROID, LEVOTHROID) 125 MCG tablet Take 125 mcg by mouth daily before breakfast.  . loperamide (IMODIUM) 2 MG capsule Take 1 capsule (2 mg total) by mouth 4 (four) times daily as needed for diarrhea or loose stools.  Marland Kitchen losartan (COZAAR) 50 MG tablet Take 1 tablet (50 mg total) by mouth in the morning and at bedtime.  . metoprolol tartrate (LOPRESSOR) 50 MG tablet Take 50 mg by mouth 2 (two) times daily.  . Multiple Vitamins-Minerals (PRESERVISION AREDS 2 PO) Take 1 tablet by mouth 2 (two) times daily.  Marland Kitchen oxybutynin (DITROPAN) 5 MG tablet Take 5 mg by mouth 2 (two) times daily.  . simvastatin (ZOCOR) 10 MG tablet Take 10 mg by mouth daily.     Allergies:   Feldene [piroxicam], Ivp dye [iodinated diagnostic agents], Naproxen, Sulfa antibiotics, and Vicodin [hydrocodone-acetaminophen]   Social History   Tobacco Use  . Smoking status: Never Smoker  . Smokeless tobacco: Never Used  Substance Use Topics  . Alcohol use: No  . Drug use: No     Family Hx: The patient's family history is not on file.  ROS:   Please see the history of present illness.    All other systems reviewed and are negative.   Prior CV studies:   The  following studies were reviewed today: Myoview 04/04/2019  Labs/Other Tests and Data Reviewed:    EKG:  An ECG dated 11/03/2018 was personally reviewed today and demonstrated:  NSR-SB-HR 58- LVH  Recent Labs: 11/14/2019: BUN 22; Creatinine, Ser 0.85; Potassium 4.2; Sodium 142   Recent Lipid Panel No results found for: CHOL, TRIG, HDL, CHOLHDL, LDLCALC, LDLDIRECT  Wt Readings from Last 3 Encounters:  12/12/19 214 lb (97.1 kg)  11/22/19 214 lb 6.4 oz (97.3 kg)  11/08/19 212 lb (96.2 kg)     Objective:    Vital Signs:  BP (!) 148/64 (BP Location: Left Wrist, Patient Position: Sitting, Cuff Size: Normal)   Pulse 68   Wt 214 lb (97.1 kg)   BMI 37.91 kg/m  VITAL SIGNS:  reviewed  ASSESSMENT & PLAN:    Essential hypertension Improved on BID Losartan dosing  History of chest pain- Normal coronaries 2006, negative Myoview July 2020  History of breast cancer Surgery in March 2018- no radiation or chemotherapy (on Arimidex).  Obesity (BMI 30-39.9) BMI 38  OA (osteoarthritis) of knee She has Rt knee DJD- Dr Wynelle Link following-on full dose ASA for DJD   Plan: Same Rx- f/u in office in 6 months.   COVID-19 Education: The signs and symptoms of COVID-19 were discussed with the patient and how to seek care for testing (follow up with PCP or arrange E-visit).  The importance of social distancing was discussed today.  Time:   Today, I have spent 10 minutes with the patient with telehealth technology discussing the above problems.     Medication Adjustments/Labs and Tests Ordered: Current medicines are reviewed at length with the patient today.  Concerns regarding medicines are outlined above.   Tests Ordered: No orders of the defined types were placed in this encounter.   Medication Changes: No orders of the defined types were placed in this encounter.   Follow Up:  In Person Dr Gwenlyn Found or myself in 6 months.   Signed, Kerin Ransom, PA-C  12/12/2019 2:18 PM    [facility]

## 2019-12-12 NOTE — Patient Instructions (Signed)
Medication Instructions:  NEED TO OBTAIN LIST OF BP MEDS YOUR INSURANCE COMPANY WILL COVER FOR TWICE A DAY DOSING  Your physician recommends that you continue on your current medications as directed. Please refer to the Current Medication list given to you today.  *If you need a refill on your cardiac medications before your next appointment, please call your pharmacy*   Lab Work: none If you have labs (blood work) drawn today and your tests are completely normal, you will receive your results only by: Marland Kitchen MyChart Message (if you have MyChart) OR . A paper copy in the mail If you have any lab test that is abnormal or we need to change your treatment, we will call you to review the results.   Testing/Procedures: none   Follow-Up: At [facility], you and your health needs are our priority.  As part of our continuing mission to provide you with exceptional heart care, we have created designated Provider Care Teams.  These Care Teams include your primary Cardiologist (physician) and Advanced Practice Providers (APPs -  Physician Assistants and Nurse Practitioners) who all work together to provide you with the care you need, when you need it.  We recommend signing up for the patient portal called "MyChart".  Sign up information is provided on this After Visit Summary.  MyChart is used to connect with patients for Virtual Visits (Telemedicine).  Patients are able to view lab/test results, encounter notes, upcoming appointments, etc.  Non-urgent messages can be sent to your provider as well.   To learn more about what you can do with MyChart, go to [email or web address].    Your next appointment:   6 month(s)  The format for your next appointment:   In Person  Provider:   You may see Quay Burow, MD or one of the following Advanced Practice Providers on your designated Care Team:    Kerin Ransom, [place]   Other Instructions

## 2020-01-04 DIAGNOSIS — M1991 Primary osteoarthritis, unspecified site: Secondary | ICD-10-CM | POA: Diagnosis not present

## 2020-01-04 DIAGNOSIS — E7849 Other hyperlipidemia: Secondary | ICD-10-CM | POA: Diagnosis not present

## 2020-01-04 DIAGNOSIS — E039 Hypothyroidism, unspecified: Secondary | ICD-10-CM | POA: Diagnosis not present

## 2020-01-04 DIAGNOSIS — I10 Essential (primary) hypertension: Secondary | ICD-10-CM | POA: Diagnosis not present

## 2020-01-21 ENCOUNTER — Other Ambulatory Visit: Payer: Self-pay

## 2020-01-21 ENCOUNTER — Encounter: Payer: Self-pay | Admitting: Orthopedic Surgery

## 2020-01-21 ENCOUNTER — Ambulatory Visit (INDEPENDENT_AMBULATORY_CARE_PROVIDER_SITE_OTHER): Payer: Medicare Other | Admitting: Orthopedic Surgery

## 2020-01-21 VITALS — Ht 63.0 in | Wt 214.0 lb

## 2020-01-21 DIAGNOSIS — B351 Tinea unguium: Secondary | ICD-10-CM | POA: Diagnosis not present

## 2020-01-21 NOTE — Progress Notes (Signed)
Office Visit Note   Patient: [name]           Date of Birth: 10-04-1940           MRN: [id number] Visit Date: 01/21/2020              Requested by: Sharilyn Sites, [place],  [place] [address] PCP: Sharilyn Sites, MD  Chief Complaint  Patient presents with  . Left Foot - Follow-up    Toenail and callus trimming  . Right Foot - Follow-up    Toenail and callus trimming      HPI: Patient is a 79 year old woman who presents in follow-up for both feet she has had a history of calluses and ulcers on both feet as well as onychomycotic nails.  Patient denies any pain at this time.  Assessment & Plan: Visit Diagnoses:  1. Onychomycosis     Plan: Nails were trimmed Q000111Q without complications no plantar ulcers.  Follow-Up Instructions: Return in about 3 months (around 04/22/2020).   Ortho Exam  Patient is alert, oriented, no adenopathy, well-dressed, normal affect, normal respiratory effort. Examination both feet have no ulcers no cellulitis.  She does have thickened discolored onychomycotic nails x10 she is unable to safely trim the nails on her own and the nails were trimmed Q000111Q without complications.  Imaging: No results found. No images are attached to the encounter.  Labs: No results found for: HGBA1C, ESRSEDRATE, CRP, LABURIC, REPTSTATUS, GRAMSTAIN, CULT, LABORGA   Lab Results  Component Value Date   ALBUMIN 3.9 12/06/2014    No results found for: MG No results found for: VD25OH  No results found for: PREALBUMIN CBC EXTENDED Latest Ref Rng & Units 12/16/2014 12/15/2014 12/14/2014  WBC 4.0 - 10.5 K/uL 7.9 9.3 8.8  RBC 3.87 - 5.11 MIL/uL 3.17(L) 3.38(L) 3.76(L)  HGB 12.0 - 15.0 g/dL 9.4(L) 10.2(L) 11.2(L)  HCT 36.0 - 46.0 % 28.2(L) 29.9(L) 33.4(L)  PLT 150 - 400 K/uL 127(L) 141(L) 146(L)     Body mass index is 37.91 kg/m.  Orders:  No orders of the defined types were placed in this encounter.  No orders of the defined types were  placed in this encounter.    Procedures: No procedures performed  Clinical Data: No additional findings.  ROS:  All other systems negative, except as noted in the HPI. Review of Systems  Objective: Vital Signs: Ht 5\' 3"  (1.6 m)   Wt 214 lb (97.1 kg)   BMI 37.91 kg/m   Specialty Comments:  No specialty comments available.  PMFS History: Patient Active Problem List   Diagnosis Date Noted  . History of chest pain 03/20/2019  . History of breast cancer 08/14/2018  . Obesity (BMI 30-39.9) 08/14/2018  . Bilateral lower extremity edema 02/14/2018  . Essential hypertension 02/14/2018  . Hyperlipidemia 02/14/2018  . Family history of heart disease 02/14/2018  . Onychomycosis 08/26/2016  . OA (osteoarthritis) of knee 12/13/2014   Past Medical History:  Diagnosis Date  . Arthritis   . Bladder spasms    "spastic bladder" wears pads  . Cancer ([place])    Skin cancer -scalp and eyebrow"basal cell"  . Complication of anesthesia    woke up too early from a surgery  . GERD (gastroesophageal reflux disease)    occ. frequent "hiccoughs" after "eating, indigestion"  . Hyperlipemia   . Hypertension   . Hypothyroidism   . Trigger finger, left    alternate fingers affected. -Occ. shooting pain.  Marland Kitchen  Wears glasses     History reviewed. No pertinent family history.  Past Surgical History:  Procedure Laterality Date  . ABDOMINAL HYSTERECTOMY  1989  . APPENDECTOMY    . CARDIAC CATHETERIZATION  2006   normal-done for work up pre hip surgeries  . CARPAL TUNNEL RELEASE Left   . CHOLECYSTECTOMY  1997  . JOINT REPLACEMENT     BTHA  . POLYPECTOMY Bilateral 06/26/2013   Procedure: BILATERAL EXCISION OF NASAL POLYPS ;  Surgeon: Ascencion Dike, MD;  Location: [facility];  Service: ENT;  Laterality: Bilateral;  . SEPTOPLASTY N/A 06/26/2013   Procedure: AND SEPTOPLASTY;  Surgeon: Ascencion Dike, MD;  Location: [facility];  Service: ENT;  Laterality: N/A;  .  THYROIDECTOMY  1994  . TONSILLECTOMY    . TOTAL HIP ARTHROPLASTY  2006   right  . TOTAL HIP ARTHROPLASTY  2006   left  . TOTAL KNEE ARTHROPLASTY Right 12/13/2014   Procedure: RIGHT TOTAL KNEE ARTHROPLASTY;  Surgeon: Gaynelle Arabian, MD;  [facility];  Service: Orthopedics;  Laterality: Right;   Social History   Occupational History  . Not on file  Tobacco Use  . Smoking status: Never Smoker  . Smokeless tobacco: Never Used  Substance and Sexual Activity  . Alcohol use: No  . Drug use: No  . Sexual activity: Not Currently

## 2020-02-04 DIAGNOSIS — E039 Hypothyroidism, unspecified: Secondary | ICD-10-CM | POA: Diagnosis not present

## 2020-02-04 DIAGNOSIS — I10 Essential (primary) hypertension: Secondary | ICD-10-CM | POA: Diagnosis not present

## 2020-02-04 DIAGNOSIS — M1991 Primary osteoarthritis, unspecified site: Secondary | ICD-10-CM | POA: Diagnosis not present

## 2020-02-04 DIAGNOSIS — E7849 Other hyperlipidemia: Secondary | ICD-10-CM | POA: Diagnosis not present

## 2020-03-05 DIAGNOSIS — E7849 Other hyperlipidemia: Secondary | ICD-10-CM | POA: Diagnosis not present

## 2020-03-05 DIAGNOSIS — M1991 Primary osteoarthritis, unspecified site: Secondary | ICD-10-CM | POA: Diagnosis not present

## 2020-03-05 DIAGNOSIS — E039 Hypothyroidism, unspecified: Secondary | ICD-10-CM | POA: Diagnosis not present

## 2020-03-05 DIAGNOSIS — I10 Essential (primary) hypertension: Secondary | ICD-10-CM | POA: Diagnosis not present

## 2020-03-13 ENCOUNTER — Telehealth: Payer: Self-pay | Admitting: Cardiology

## 2020-03-13 NOTE — Telephone Encounter (Signed)
Unable to reach pt or leave a message will forward to luke to review and advise

## 2020-03-13 NOTE — Telephone Encounter (Signed)
Pt c/o medication issue:  1. Name of Medication: losartan (COZAAR) 50 MG tablet  2. How are you currently taking this medication (dosage and times per day)? 1 tablet daily  3. Are you having a reaction (difficulty breathing--STAT)? no  4. What is your medication issue? Patient states she did not qualify for her insurance to cover the cost of the medication. She states she went to her insurance agents website and called their pharmacist. She states he gave her a list of 3 drugs Lisinopril, Benazepril, Enalapril and ranked them of what would be best alternative medications. She states she will need a new prescription of one of the medications and only has enough losartan part of next week.

## 2020-03-14 MED ORDER — LISINOPRIL 20 MG PO TABS: 20.0000 mg | ORAL_TABLET | Freq: Every day | ORAL | 3 refills | Status: DC

## 2020-03-14 NOTE — Telephone Encounter (Signed)
Spoke with pt, aware of luke's recommendations. New script sent to the pharmacy

## 2020-03-14 NOTE — Telephone Encounter (Signed)
That's pretty bad insurance.  OK to switch her to Lisinopril 20mg  daily.  She should continue to monitor her B/P and let us know if she develops a dry cough.  Kerin Ransom PA-C 03/14/2020 8:58 AM

## 2020-04-04 DIAGNOSIS — M1991 Primary osteoarthritis, unspecified site: Secondary | ICD-10-CM | POA: Diagnosis not present

## 2020-04-04 DIAGNOSIS — I10 Essential (primary) hypertension: Secondary | ICD-10-CM | POA: Diagnosis not present

## 2020-04-04 DIAGNOSIS — E7849 Other hyperlipidemia: Secondary | ICD-10-CM | POA: Diagnosis not present

## 2020-04-04 DIAGNOSIS — E039 Hypothyroidism, unspecified: Secondary | ICD-10-CM | POA: Diagnosis not present

## 2020-04-24 ENCOUNTER — Ambulatory Visit: Payer: Medicare Other | Admitting: Orthopedic Surgery

## 2020-04-24 DIAGNOSIS — E039 Hypothyroidism, unspecified: Secondary | ICD-10-CM | POA: Diagnosis not present

## 2020-05-28 DIAGNOSIS — Z1389 Encounter for screening for other disorder: Secondary | ICD-10-CM | POA: Diagnosis not present

## 2020-05-28 DIAGNOSIS — E7849 Other hyperlipidemia: Secondary | ICD-10-CM | POA: Diagnosis not present

## 2020-05-28 DIAGNOSIS — Z6837 Body mass index (BMI) 37.0-37.9, adult: Secondary | ICD-10-CM | POA: Diagnosis not present

## 2020-05-28 DIAGNOSIS — Z0001 Encounter for general adult medical examination with abnormal findings: Secondary | ICD-10-CM | POA: Diagnosis not present

## 2020-05-28 DIAGNOSIS — E039 Hypothyroidism, unspecified: Secondary | ICD-10-CM | POA: Diagnosis not present

## 2020-05-28 DIAGNOSIS — I10 Essential (primary) hypertension: Secondary | ICD-10-CM | POA: Diagnosis not present

## 2020-05-28 DIAGNOSIS — H3530 Unspecified macular degeneration: Secondary | ICD-10-CM | POA: Diagnosis not present

## 2020-05-28 DIAGNOSIS — E559 Vitamin D deficiency, unspecified: Secondary | ICD-10-CM | POA: Diagnosis not present

## 2020-05-28 DIAGNOSIS — R7309 Other abnormal glucose: Secondary | ICD-10-CM | POA: Diagnosis not present

## 2020-06-03 ENCOUNTER — Encounter: Payer: Self-pay | Admitting: Urology

## 2020-06-03 ENCOUNTER — Ambulatory Visit (INDEPENDENT_AMBULATORY_CARE_PROVIDER_SITE_OTHER): Payer: Medicare Other | Admitting: Urology

## 2020-06-03 ENCOUNTER — Other Ambulatory Visit: Payer: Self-pay

## 2020-06-03 VITALS — BP 141/77 | HR 70 | Temp 98.0°F | Ht 63.0 in | Wt 214.0 lb

## 2020-06-03 DIAGNOSIS — N3946 Mixed incontinence: Secondary | ICD-10-CM

## 2020-06-03 LAB — URINALYSIS, ROUTINE W REFLEX MICROSCOPIC
Bilirubin, UA: NEGATIVE
Glucose, UA: NEGATIVE
Ketones, UA: NEGATIVE
Leukocytes,UA: NEGATIVE
Nitrite, UA: NEGATIVE
Protein,UA: NEGATIVE
RBC, UA: NEGATIVE
Specific Gravity, UA: 1.025 (ref 1.005–1.030)
Urobilinogen, Ur: 0.2 mg/dL (ref 0.2–1.0)
pH, UA: 7 (ref 5.0–7.5)

## 2020-06-03 NOTE — Progress Notes (Signed)
H&P    History of Present Illness: [name] is a 79 y.o. year old female  9.28.2021: Pt here for f/u of her intermittent urgency incontinence, urinary frequency, and nocturia. Pt will saturate approximately 1 incontinence pad per day. Pt notes that she will void approximately 1/4 cup of urine per nocturnal voiding episode. Pt was prescribed furosemide, but does not take it regularly.  (below copied from [facility] records):  9.15.2020: I was consulted by Dr Ethlyn Gallery regarding Ms Lenhard's urinary incontinence and nocturia.   For years she was getting up 4-5 times a night, but other times less. She is a [employer] that works in [place] and she has had this problem for a long time. It sounds like her nocturia and incontinence is worse, but things have improved. She still sometimes leaks with coughing and sneezing, and sometimes with urgency. She used to leak when she got out of the car. She has no enuresis. She uses 1 pad a day and sometimes it is dry.   Ms Happe was assessed in May. She has seen her physical therapy team. She failed VESIcare, Toviaz, and Myrbetriq. We talked about PTNS. She might try DDAVP in the future. She was improved on Oxybutynin 15 mg.   Patient switched to oxybutynin twice a day for insurance reasons. Reasonable control of urge incontinence. Frequency stable. Clinically not infected. Three-month supply given see in 1 year   Today  Frequency is stable.  She has good days and bad days on the oxybutynin twice a day. There is really not another medication to switch her to. Overall she is stable. A 3 month supply with 3 refills given. At her request she would like to be followed up in [place] fill and I will have her see Dr. Jeffie Pollock who do not think will mine filling her prescription. She feels a little twinge 9 again in the vaginal area and I sent the urine for culture.     Past Medical History:  Diagnosis Date  . Arthritis   . Bladder spasms    "spastic bladder" wears  pads  . Cancer ([place])    Skin cancer -scalp and eyebrow"basal cell"  . Complication of anesthesia    woke up too early from a surgery  . GERD (gastroesophageal reflux disease)    occ. frequent "hiccoughs" after "eating, indigestion"  . Hyperlipemia   . Hypertension   . Hypothyroidism   . Trigger finger, left    alternate fingers affected. -Occ. shooting pain.  . Wears glasses     Past Surgical History:  Procedure Laterality Date  . ABDOMINAL HYSTERECTOMY  1989  . APPENDECTOMY    . CARDIAC CATHETERIZATION  2006   normal-done for work up pre hip surgeries  . CARPAL TUNNEL RELEASE Left   . CHOLECYSTECTOMY  1997  . JOINT REPLACEMENT     BTHA  . POLYPECTOMY Bilateral 06/26/2013   Procedure: BILATERAL EXCISION OF NASAL POLYPS ;  Surgeon: Ascencion Dike, MD;  Location: [facility];  Service: ENT;  Laterality: Bilateral;  . SEPTOPLASTY N/A 06/26/2013   Procedure: AND SEPTOPLASTY;  Surgeon: Ascencion Dike, MD;  Location: [facility];  Service: ENT;  Laterality: N/A;  . THYROIDECTOMY  1994  . TONSILLECTOMY    . TOTAL HIP ARTHROPLASTY  2006   right  . TOTAL HIP ARTHROPLASTY  2006   left  . TOTAL KNEE ARTHROPLASTY Right 12/13/2014   Procedure: RIGHT TOTAL KNEE ARTHROPLASTY;  Surgeon: Gaynelle Arabian, MD;  [facility];  Service: Orthopedics;  Laterality: Right;    Home Medications:  (Not in a hospital admission)   Allergies:  Allergies  Allergen Reactions  . Feldene [Piroxicam] Other (See Comments)    CANT REMEMBER  . Ivp Dye [Iodinated Diagnostic Agents] Other (See Comments)    CANT REMEMBER  . Naproxen Nausea And Vomiting  . Sulfa Antibiotics     CANT REMEMBER  . Vicodin [Hydrocodone-Acetaminophen] Nausea And Vomiting    No family history on file.  Social History:  reports that she has never smoked. She has never used smokeless tobacco. She reports that she does not drink alcohol and does not use drugs.  ROS: A complete review of systems was  performed.  All systems are negative except for pertinent findings as noted.  Physical Exam:  Vital signs in last 24 hours: BP: ()/()  Arterial Line BP: ()/()  General:  Alert and oriented, No acute distress HEENT: Normocephalic, atraumatic Neck: No JVD Lungs: Normal inspiratory/expiratory excursion Abdomen: Soft, nontender, nondistended, no abdominal masses Back: No CVA tenderness Extremities:2+ Peripheral edema Neurologic: Grossly intact  Laboratory Data:  No results found for this or any previous visit (from the past 24 hour(s)). No results found for this or any previous visit (from the past 240 hour(s)). Creatinine: No results for input(s): CREATININE in the last 168 hours.  Radiologic Imaging: No results found.  Impression/Assessment:  OAB  Nocturia  Plan:  1. Discussed desmopressin w/ pt but advised pt to try self-help measures prior to pharmaceutical intervention - limit sodium intake, limit pm fluids, and consider am furosemide. Pt will call   2. Pt advised to check on the price of solifenacin and tolterodine and will call to relay pharmaceutical preference (refill oxybutynin or change)  3. F/U in 2 months for OV and symptom recheck.  CC: Dr. Sharilyn Sites  Budd Palmer 06/03/2020, 11:21 AM  Lillette Boxer. Jais Demir MD

## 2020-06-03 NOTE — Progress Notes (Signed)
Urological Symptom Review  Patient is experiencing the following symptoms: Frequent urination Hard to postpone urination Get up at night to urinate   Review of Systems  Gastrointestinal (upper)  : Indigestion/heartburn  Gastrointestinal (lower) : Constipation  Constitutional : Negative for symptoms  Skin: Negative for skin symptoms  Eyes: Negative for eye symptoms  Ear/Nose/Throat : Negative for Ear/Nose/Throat symptoms  Hematologic/Lymphatic: Negative for Hematologic/Lymphatic symptoms  Cardiovascular : Negative for cardiovascular symptoms  Respiratory : Negative for respiratory symptoms  Endocrine: Negative for endocrine symptoms  Musculoskeletal: Back pain Joint pain  Neurological: Negative for neurological symptoms  Psychologic: Negative for psychiatric symptoms

## 2020-06-25 ENCOUNTER — Other Ambulatory Visit: Payer: Self-pay

## 2020-06-25 ENCOUNTER — Ambulatory Visit (INDEPENDENT_AMBULATORY_CARE_PROVIDER_SITE_OTHER): Payer: Medicare Other | Admitting: Cardiovascular Disease

## 2020-06-25 ENCOUNTER — Encounter: Payer: Self-pay | Admitting: Cardiovascular Disease

## 2020-06-25 DIAGNOSIS — E782 Mixed hyperlipidemia: Secondary | ICD-10-CM | POA: Diagnosis not present

## 2020-06-25 DIAGNOSIS — H524 Presbyopia: Secondary | ICD-10-CM | POA: Diagnosis not present

## 2020-06-25 DIAGNOSIS — H52223 Regular astigmatism, bilateral: Secondary | ICD-10-CM | POA: Diagnosis not present

## 2020-06-25 DIAGNOSIS — R600 Localized edema: Secondary | ICD-10-CM

## 2020-06-25 DIAGNOSIS — H5211 Myopia, right eye: Secondary | ICD-10-CM | POA: Diagnosis not present

## 2020-06-25 DIAGNOSIS — H353121 Nonexudative age-related macular degeneration, left eye, early dry stage: Secondary | ICD-10-CM | POA: Diagnosis not present

## 2020-06-25 DIAGNOSIS — I10 Essential (primary) hypertension: Secondary | ICD-10-CM

## 2020-06-25 DIAGNOSIS — H26492 Other secondary cataract, left eye: Secondary | ICD-10-CM | POA: Diagnosis not present

## 2020-06-25 DIAGNOSIS — Z87898 Personal history of other specified conditions: Secondary | ICD-10-CM

## 2020-06-25 DIAGNOSIS — H353111 Nonexudative age-related macular degeneration, right eye, early dry stage: Secondary | ICD-10-CM | POA: Diagnosis not present

## 2020-06-25 NOTE — Progress Notes (Signed)
06/25/2020 MAIMOUNA RONDEAU   [birth year]  [id number]  Primary Physician Sharilyn Sites, MD Primary Cardiologist: Lorretta Harp MD Garret Reddish, [place], [place]  HPI:  [name] is a 79 y.o.    mildly overweight single Caucasian female with no children referred by Collene Mares, PA-C to evaluate bilateral lower extremity edema.  I last saw her in the office 02/14/2018.  Her mother Chelsye Suhre was also a patient of mine who unfortunately passed away at age 70 on 04-12-2020. She does have a history of treated hypertension and hyperlipidemia. She is never had a heart attack or stroke. Her mother does have CAD and she is my patient. She denies chest pain or shortness of breath. She is had lower extremity edema for the last several weeks with a recent BMP drawn by her PCP of 144. She was begun on as needed Lasix which has improved her swelling somewhat.  She developed some chest pain occurring several times a week lasting minutes at a time.  I did perform Myoview stress testing 04/04/2019 which was low risk and nonischemic.  Since I saw her a year ago she is remained asymptomatic.  Unfortunately, she lost her mother [name] on Apr 13, 2023 of this year who is also a longtime patient of mine.   Current Meds  Medication Sig  . anastrozole (ARIMIDEX) 1 MG tablet Take 1 mg by mouth daily.  Marland Kitchen aspirin EC 325 MG tablet Take 1 tablet (325 mg total) by mouth daily.  . Calcium Citrate (CITRACAL PO) Take 2 tablets by mouth 2 (two) times daily.   . Cholecalciferol (VITAMIN D) 50 MCG (2000 UT) CAPS Take 1 capsule by mouth 2 (two) times daily.  . Cholecalciferol 1.25 MG (50000 UT) TABS Take by mouth.  . furosemide (LASIX) 20 MG tablet Take 20-40 mg by mouth as needed.  . hydrochlorothiazide (MICROZIDE) 12.5 MG capsule TAKE 1 CAPSULE BY MOUTH EVERY DAY  . levothyroxine (SYNTHROID, LEVOTHROID) 125 MCG tablet Take 125 mcg by mouth daily before breakfast.  . loperamide (IMODIUM) 2 MG capsule Take 1 capsule (2  mg total) by mouth 4 (four) times daily as needed for diarrhea or loose stools.  . metoprolol tartrate (LOPRESSOR) 50 MG tablet Take 50 mg by mouth 2 (two) times daily.  . Multiple Vitamins-Minerals (PRESERVISION AREDS 2 PO) Take 1 tablet by mouth 2 (two) times daily.  Marland Kitchen oxybutynin (DITROPAN) 5 MG tablet Take 5 mg by mouth 2 (two) times daily.  . simvastatin (ZOCOR) 10 MG tablet Take 10 mg by mouth daily.     Allergies  Allergen Reactions  . Feldene [Piroxicam] Other (See Comments)    CANT REMEMBER  . Ivp Dye [Iodinated Diagnostic Agents] Other (See Comments)    CANT REMEMBER  . Naproxen Nausea And Vomiting  . Sulfa Antibiotics     CANT REMEMBER  . Vicodin [Hydrocodone-Acetaminophen] Nausea And Vomiting    Social History   Socioeconomic History  . Marital status: Single    Spouse name: Not on file  . Number of children: Not on file  . Years of education: Not on file  . Highest education level: Not on file  Occupational History  . Not on file  Tobacco Use  . Smoking status: Never Smoker  . Smokeless tobacco: Never Used  Substance and Sexual Activity  . Alcohol use: No  . Drug use: No  . Sexual activity: Not Currently  Other Topics Concern  . Not on file  Social History Narrative  .  Not on file   Social Determinants of Health   Financial Resource Strain:   . Difficulty of Paying Living Expenses: Not on file  Food Insecurity:   . Worried About Charity fundraiser in the Last Year: Not on file  . Ran Out of Food in the Last Year: Not on file  Transportation Needs:   . Lack of Transportation (Medical): Not on file  . Lack of Transportation (Non-Medical): Not on file  Physical Activity:   . Days of Exercise per Week: Not on file  . Minutes of Exercise per Session: Not on file  Stress:   . Feeling of Stress : Not on file  Social Connections:   . Frequency of Communication with Friends and Family: Not on file  . Frequency of Social Gatherings with Friends and Family:  Not on file  . Attends Religious Services: Not on file  . Active Member of Clubs or Organizations: Not on file  . Attends Archivist Meetings: Not on file  . Marital Status: Not on file  Intimate Partner Violence:   . Fear of Current or Ex-Partner: Not on file  . Emotionally Abused: Not on file  . Physically Abused: Not on file  . Sexually Abused: Not on file     Review of Systems: General: negative for chills, fever, night sweats or weight changes.  Cardiovascular: negative for chest pain, dyspnea on exertion, edema, orthopnea, palpitations, paroxysmal nocturnal dyspnea or shortness of breath Dermatological: negative for rash Respiratory: negative for cough or wheezing Urologic: negative for hematuria Abdominal: negative for nausea, vomiting, diarrhea, bright red blood per rectum, melena, or hematemesis Neurologic: negative for visual changes, syncope, or dizziness All other systems reviewed and are otherwise negative except as noted above.    Blood pressure (!) 146/68, pulse (!) 59, height 5\' 2"  (1.575 m), weight 206 lb (93.4 kg).  General appearance: alert and no distress Neck: no adenopathy, no carotid bruit, no JVD, supple, symmetrical, trachea midline and thyroid not enlarged, symmetric, no tenderness/mass/nodules Lungs: clear to auscultation bilaterally Heart: regular rate and rhythm, S1, S2 normal, no murmur, click, rub or gallop Extremities: extremities normal, atraumatic, no cyanosis or edema Pulses: 2+ and symmetric Skin: Skin color, texture, turgor normal. No rashes or lesions Neurologic: Alert and oriented X 3, normal strength and tone. Normal symmetric reflexes. Normal coordination and gait  EKG sinus bradycardia at 59 with minimal criteria for LVH and nonspecific ST and T wave changes.  Personally reviewed this EKG.  ASSESSMENT AND PLAN:   Bilateral lower extremity edema History of bilateral lower extreme edema on furosemide in the past which she no  longer takes.  She has no edema on exam today.  Essential hypertension History of essential hypertension a blood pressure measured today at 146/68.  She is on hydrochlorothiazide, metoprolol and lisinopril which was recently started that has caused a dry hacking cough.  She was on losartan in the past which she tolerated but unfortunately insurance was problematic.  Going to readdress an ARB.  Hyperlipidemia History of hyperlipidemia on statin therapy with lipid profile performed 05/28/2020 revealing 10 cholesterol 165, LDL of 93 and HDL 41.  History of chest pain History of atypical chest pain in the past with Myoview stress test performed 04/04/2019 which was low risk and nonischemic.      Lorretta Harp MD FACP,FACC,FAHA, [facility] 06/25/2020 11:46 AM

## 2020-06-25 NOTE — Assessment & Plan Note (Signed)
History of hyperlipidemia on statin therapy with lipid profile performed 05/28/2020 revealing 10 cholesterol 165, LDL of 93 and HDL 41.

## 2020-06-25 NOTE — Assessment & Plan Note (Signed)
History of atypical chest pain in the past with Myoview stress test performed 04/04/2019 which was low risk and nonischemic.

## 2020-06-25 NOTE — Patient Instructions (Signed)
Medication Instructions:  No changes *If you need a refill on your cardiac medications before your next appointment, please call your pharmacy*   Lab Work: None ordered If you have labs (blood work) drawn today and your tests are completely normal, you will receive your results only by: Marland Kitchen MyChart Message (if you have MyChart) OR . A paper copy in the mail If you have any lab test that is abnormal or we need to change your treatment, we will call you to review the results.   Testing/Procedures: None ordered   Follow-Up: At [facility], you and your health needs are our priority.  As part of our continuing mission to provide you with exceptional heart care, we have created designated Provider Care Teams.  These Care Teams include your primary Cardiologist (physician) and Advanced Practice Providers (APPs -  Physician Assistants and Nurse Practitioners) who all work together to provide you with the care you need, when you need it.  We recommend signing up for the patient portal called "MyChart".  Sign up information is provided on this After Visit Summary.  MyChart is used to connect with patients for Virtual Visits (Telemedicine).  Patients are able to view lab/test results, encounter notes, upcoming appointments, etc.  Non-urgent messages can be sent to your provider as well.   To learn more about what you can do with MyChart, go to [email or web address].    Your next appointment:   12 month(s)  The format for your next appointment:   In Person  Provider:   You may see Quay Burow, MD or one of the following Advanced Practice Providers on your designated Care Team:    Kerin Ransom, PA-C  [place], [place]  Coletta Memos, [place]

## 2020-06-25 NOTE — Assessment & Plan Note (Signed)
History of essential hypertension a blood pressure measured today at 146/68.  She is on hydrochlorothiazide, metoprolol and lisinopril which was recently started that has caused a dry hacking cough.  She was on losartan in the past which she tolerated but unfortunately insurance was problematic.  Going to readdress an ARB.

## 2020-06-25 NOTE — Assessment & Plan Note (Signed)
History of bilateral lower extreme edema on furosemide in the past which she no longer takes.  She has no edema on exam today.

## 2020-06-30 ENCOUNTER — Telehealth: Payer: Self-pay | Admitting: *Deleted

## 2020-06-30 DIAGNOSIS — C50912 Malignant neoplasm of unspecified site of left female breast: Secondary | ICD-10-CM | POA: Diagnosis not present

## 2020-06-30 DIAGNOSIS — Z79811 Long term (current) use of aromatase inhibitors: Secondary | ICD-10-CM | POA: Diagnosis not present

## 2020-06-30 DIAGNOSIS — Z9889 Other specified postprocedural states: Secondary | ICD-10-CM | POA: Diagnosis not present

## 2020-06-30 DIAGNOSIS — Z885 Allergy status to narcotic agent status: Secondary | ICD-10-CM | POA: Diagnosis not present

## 2020-06-30 DIAGNOSIS — C50512 Malignant neoplasm of lower-outer quadrant of left female breast: Secondary | ICD-10-CM | POA: Diagnosis not present

## 2020-06-30 DIAGNOSIS — F4321 Adjustment disorder with depressed mood: Secondary | ICD-10-CM | POA: Diagnosis not present

## 2020-06-30 DIAGNOSIS — Z634 Disappearance and death of family member: Secondary | ICD-10-CM | POA: Diagnosis not present

## 2020-06-30 DIAGNOSIS — Z85828 Personal history of other malignant neoplasm of skin: Secondary | ICD-10-CM | POA: Diagnosis not present

## 2020-06-30 DIAGNOSIS — Z2821 Immunization not carried out because of patient refusal: Secondary | ICD-10-CM | POA: Diagnosis not present

## 2020-06-30 DIAGNOSIS — Z08 Encounter for follow-up examination after completed treatment for malignant neoplasm: Secondary | ICD-10-CM | POA: Diagnosis not present

## 2020-06-30 DIAGNOSIS — Z96698 Presence of other orthopedic joint implants: Secondary | ICD-10-CM | POA: Diagnosis not present

## 2020-06-30 DIAGNOSIS — M8588 Other specified disorders of bone density and structure, other site: Secondary | ICD-10-CM | POA: Diagnosis not present

## 2020-06-30 DIAGNOSIS — Z888 Allergy status to other drugs, medicaments and biological substances status: Secondary | ICD-10-CM | POA: Diagnosis not present

## 2020-06-30 DIAGNOSIS — Z882 Allergy status to sulfonamides status: Secondary | ICD-10-CM | POA: Diagnosis not present

## 2020-06-30 DIAGNOSIS — Z170 Estrogen receptor positive status [ER+]: Secondary | ICD-10-CM | POA: Diagnosis not present

## 2020-06-30 DIAGNOSIS — Z853 Personal history of malignant neoplasm of breast: Secondary | ICD-10-CM | POA: Diagnosis not present

## 2020-06-30 DIAGNOSIS — C50112 Malignant neoplasm of central portion of left female breast: Secondary | ICD-10-CM | POA: Diagnosis not present

## 2020-06-30 DIAGNOSIS — M85832 Other specified disorders of bone density and structure, left forearm: Secondary | ICD-10-CM | POA: Diagnosis not present

## 2020-06-30 DIAGNOSIS — Z91040 Latex allergy status: Secondary | ICD-10-CM | POA: Diagnosis not present

## 2020-06-30 NOTE — Telephone Encounter (Signed)
Prior Auth attempted for Losartan 50 mg once daily through CoverMyMeds :[id number]  Prior Josem Kaufmann is not needed for this medication because "Drug is covered by current benefit plan. No further PA activity needed".

## 2020-07-01 NOTE — Telephone Encounter (Signed)
Left a message for the patient to call back.  

## 2020-07-01 NOTE — Telephone Encounter (Signed)
Patient returned call

## 2020-07-01 NOTE — Telephone Encounter (Signed)
Spoke with the patient. She stated that she just got a 90 day refill on the Lisinopril so would like to know if she can wait 3 months before switching to the Losartan 50 mg. She stated that the cough is not bad and she would be able to tolerate it but would do what Dr. Gwenlyn Found recommends.

## 2020-07-01 NOTE — Telephone Encounter (Signed)
Yes

## 2020-07-03 NOTE — Telephone Encounter (Signed)
The patient has been made aware. She stated that she did not want the Losartan sent in yet. She will call when she is ready.

## 2020-07-03 NOTE — Telephone Encounter (Signed)
Patient returning Lisa's phone call, connected call to [place].

## 2020-07-03 NOTE — Telephone Encounter (Signed)
Left a message for the patient to call back.  

## 2020-07-03 NOTE — Telephone Encounter (Signed)
If the cough isn't that bad can absolutely wait to switch

## 2020-07-09 ENCOUNTER — Telehealth: Payer: Self-pay

## 2020-07-09 NOTE — Telephone Encounter (Signed)
Pt was given all info from Dr. Diona Fanti. She will try to get as he has suggested.

## 2020-07-09 NOTE — Telephone Encounter (Signed)
-----   Message from Franchot Gallo, MD sent at 07/08/2020  4:42 PM EDT ----- Regarding: letter This nice lady you wrote me the letter which I will give to you.  I will answer a few questions you can let her know about-1.  The cost for 3 months of tolterodine and solifenacin are both about $250.  I would strongly suggest that she look on good Rx- for a 21-month prescription at [facility] or more than likely [facility] will all be about $100.  Would necessitate a drive, but it would save her $150.2.  If she will start tolterodine, she probably will not need desmopressin.  I would recommend that she not start desmopressin at this time3.  I recommended that she consider asking her doctor for prescription for furosemide-this will limit her nighttime frequency

## 2020-07-22 DIAGNOSIS — Z23 Encounter for immunization: Secondary | ICD-10-CM | POA: Diagnosis not present

## 2020-08-05 ENCOUNTER — Ambulatory Visit (INDEPENDENT_AMBULATORY_CARE_PROVIDER_SITE_OTHER): Payer: Medicare Other | Admitting: Urology

## 2020-08-05 ENCOUNTER — Encounter: Payer: Self-pay | Admitting: Urology

## 2020-08-05 ENCOUNTER — Other Ambulatory Visit: Payer: Self-pay

## 2020-08-05 ENCOUNTER — Ambulatory Visit: Payer: Medicare Other | Admitting: Urology

## 2020-08-05 VITALS — BP 130/80 | HR 73 | Temp 98.4°F | Wt 208.6 lb

## 2020-08-05 DIAGNOSIS — N3946 Mixed incontinence: Secondary | ICD-10-CM

## 2020-08-05 LAB — URINALYSIS, ROUTINE W REFLEX MICROSCOPIC
Bilirubin, UA: NEGATIVE
Glucose, UA: NEGATIVE
Ketones, UA: NEGATIVE
Leukocytes,UA: NEGATIVE
Nitrite, UA: NEGATIVE
Protein,UA: NEGATIVE
RBC, UA: NEGATIVE
Specific Gravity, UA: >1.03 — ABNORMAL HIGH (ref 1.005–1.030)
Urobilinogen, Ur: 0.2 mg/dL (ref 0.2–1.0)
pH, UA: 5.5 (ref 5.0–7.5)

## 2020-08-05 MED ORDER — TOLTERODINE TARTRATE ER 4 MG PO CP24: 4.0000 mg | ORAL_CAPSULE | Freq: Every day | ORAL | 3 refills | Status: DC

## 2020-08-05 NOTE — Progress Notes (Signed)
H&P  Chief Complaint: UUI, Frequency, and Nocturia  History of Present Illness: [name] is a 79 y.o. year old female  11.30.2021: Pt continues on oxybutynin BID. She reports that she wakes on average 3-4 times per night and that she experiences day-time frequency as well. She reports urinary incontinence upon waking each morning.  (below copied from [facility] records):  9.15.2020: I was consulted by Dr Ethlyn Gallery regarding [name] urinary incontinence and nocturia.   For years she was getting up 4-5 times a night, but other times less. She is a [employer] that works in [place] and she has had this problem for a long time. It sounds like her nocturia and incontinence is worse, but things have improved. She still sometimes leaks with coughing and sneezing, and sometimes with urgency. She used to leak when she got out of the car. She has no enuresis. She uses 1 pad a day and sometimes it is dry.   [name] was assessed in May. She has seen her physical therapy team. She failed VESIcare, Toviaz, and Myrbetriq. We talked about PTNS. She might try DDAVP in the future. She was improved on Oxybutynin 15 mg.   Patient switched to oxybutynin twice a day for insurance reasons. Reasonable control of urge incontinence. Frequency stable. Clinically not infected. Three-month supply given see in 1 year   Today  Frequency is stable.  She has good days and bad days on the oxybutynin twice a day. There is really not another medication to switch her to. Overall she is stable. A 3 month supply with 3 refills given. At her request she would like to be followed up in [place] fill and I will have her see Dr. Jeffie Pollock who do not think will mine filling her prescription. She feels a little twinge 9 again in the vaginal area and I sent the urine for culture.   9.28.2021: Pt here for f/u of her intermittent urgency incontinence, urinary frequency, and nocturia. Pt will saturate approximately 1 incontinence pad per day.  Pt notes that she will void approximately 1/4 cup of urine per nocturnal voiding episode. Pt was prescribed furosemide, but does not take it regularly.  Past Medical History:  Diagnosis Date  . Arthritis   . Bladder spasms    "spastic bladder" wears pads  . Cancer ([place])    Skin cancer -scalp and eyebrow"basal cell"  . Complication of anesthesia    woke up too early from a surgery  . GERD (gastroesophageal reflux disease)    occ. frequent "hiccoughs" after "eating, indigestion"  . Hyperlipemia   . Hypertension   . Hypothyroidism   . Trigger finger, left    alternate fingers affected. -Occ. shooting pain.  . Wears glasses     Past Surgical History:  Procedure Laterality Date  . ABDOMINAL HYSTERECTOMY  1989  . APPENDECTOMY    . CARDIAC CATHETERIZATION  2006   normal-done for work up pre hip surgeries  . CARPAL TUNNEL RELEASE Left   . CHOLECYSTECTOMY  1997  . JOINT REPLACEMENT     BTHA  . POLYPECTOMY Bilateral 06/26/2013   Procedure: BILATERAL EXCISION OF NASAL POLYPS ;  Surgeon: Ascencion Dike, MD;  Location: [facility];  Service: ENT;  Laterality: Bilateral;  . SEPTOPLASTY N/A 06/26/2013   Procedure: AND SEPTOPLASTY;  Surgeon: Ascencion Dike, MD;  Location: [facility];  Service: ENT;  Laterality: N/A;  . THYROIDECTOMY  1994  . TONSILLECTOMY    . TOTAL  HIP ARTHROPLASTY  2006   right  . TOTAL HIP ARTHROPLASTY  2006   left  . TOTAL KNEE ARTHROPLASTY Right 12/13/2014   Procedure: RIGHT TOTAL KNEE ARTHROPLASTY;  Surgeon: Gaynelle Arabian, MD;  Location: [facility];  Service: Orthopedics;  Laterality: Right;    Home Medications:  (Not in a hospital admission)   Allergies:  Allergies  Allergen Reactions  . Feldene [Piroxicam] Other (See Comments)    CANT REMEMBER  . Ivp Dye [Iodinated Diagnostic Agents] Other (See Comments)    CANT REMEMBER  . Naproxen Nausea And Vomiting  . Sulfa Antibiotics     CANT REMEMBER  . Vicodin [Hydrocodone-Acetaminophen]  Nausea And Vomiting    No family history on file.  Social History:  reports that she has never smoked. She has never used smokeless tobacco. She reports that she does not drink alcohol and does not use drugs.  ROS: A complete review of systems was performed.  All systems are negative except for pertinent findings as noted.  Physical Exam:  Vital signs in last 24 hours: BP: ()/()  Arterial Line BP: ()/()  General:  Alert and oriented, No acute distress HEENT: Normocephalic, atraumatic Neck: No JVD Extremities: No edema Neurologic: Grossly intact  I have reviewed prior pt notes  I have reviewed notes from referring/previous physicians  I have reviewed urinalysis results       Impression/Assessment:  OAB - Pt symptoms are stable but persistent despite taking oxybutynin  Plan:  1. Pt discontinued on oxybutynin and started on tolterodine LA 4 mg  (qd). I'd rather not put her on desmopressin  2. F/U in 1 year for OV and symptom recheck  CC: Dr. Sharilyn Sites  Budd Palmer 08/05/2020, 10:28 AM  Lillette Boxer. Severus Brodzinski MD

## 2020-08-05 NOTE — Addendum Note (Signed)
Addended by: Valentina Lucks on: 08/05/2020 12:00 PM   Modules accepted: Orders

## 2020-08-05 NOTE — Progress Notes (Signed)
Urological Symptom Review  Patient is experiencing the following symptoms: Frequent urination Hard to postpone urination Get up at night to urinate   Review of Systems  Gastrointestinal (upper)  : Indigestion/heartburn  Gastrointestinal (lower) : Negative for lower GI symptoms  Constitutional : Negative for symptoms  Skin: Itching  Eyes: Negative for eye symptoms  Ear/Nose/Throat : Negative for Ear/Nose/Throat symptoms  Hematologic/Lymphatic: Easy bruising  Cardiovascular : Negative for cardiovascular symptoms  Respiratory : Negative for respiratory symptoms  Endocrine: Negative for endocrine symptoms  Musculoskeletal: Joint pain  Neurological: Negative for neurological symptoms  Psychologic: Negative for psychiatric symptoms

## 2020-08-12 ENCOUNTER — Telehealth: Payer: Self-pay | Admitting: Cardiovascular Disease

## 2020-08-12 NOTE — Telephone Encounter (Signed)
*  STAT* If patient is at the pharmacy, call can be transferred to refill team.   1. Which medications need to be refilled? (please list name of each medication and dose if known) Losartan  2. Which pharmacy/location (including street and city if local pharmacy) is medication to be sent to? [id number] - [place], [place] - [address]  3. Do they need a 30 day or 90 day supply? 90 day supply

## 2020-08-21 DIAGNOSIS — Z23 Encounter for immunization: Secondary | ICD-10-CM | POA: Diagnosis not present

## 2020-08-26 MED ORDER — LOSARTAN POTASSIUM 50 MG PO TABS: 50.0000 mg | ORAL_TABLET | Freq: Every day | ORAL | 3 refills | Status: DC

## 2020-08-26 NOTE — Telephone Encounter (Signed)
Patient follow up. [facility]

## 2020-08-26 NOTE — Telephone Encounter (Signed)
Spoke with pt, new prescription for losartan 50 mg once daily sent to the pharmacy per telephone note.

## 2020-09-08 ENCOUNTER — Other Ambulatory Visit: Payer: Self-pay | Admitting: Cardiovascular Disease

## 2020-09-12 ENCOUNTER — Encounter: Payer: Self-pay | Admitting: Emergency Medicine

## 2020-09-12 ENCOUNTER — Ambulatory Visit
Admission: EM | Admit: 2020-09-12 | Discharge: 2020-09-12 | Disposition: A | Discharge status: Home / Self Care | Payer: Medicare Other | Attending: Family Medicine | Admitting: Family Medicine

## 2020-09-12 ENCOUNTER — Ambulatory Visit (INDEPENDENT_AMBULATORY_CARE_PROVIDER_SITE_OTHER): Payer: Medicare Other

## 2020-09-12 DIAGNOSIS — R5383 Other fatigue: Secondary | ICD-10-CM | POA: Diagnosis not present

## 2020-09-12 DIAGNOSIS — R062 Wheezing: Secondary | ICD-10-CM | POA: Diagnosis not present

## 2020-09-12 DIAGNOSIS — R059 Cough, unspecified: Secondary | ICD-10-CM

## 2020-09-12 DIAGNOSIS — R0789 Other chest pain: Secondary | ICD-10-CM | POA: Diagnosis not present

## 2020-09-12 DIAGNOSIS — J069 Acute upper respiratory infection, unspecified: Secondary | ICD-10-CM

## 2020-09-12 DIAGNOSIS — R21 Rash and other nonspecific skin eruption: Secondary | ICD-10-CM | POA: Diagnosis not present

## 2020-09-12 MED ORDER — DOXYCYCLINE HYCLATE 100 MG PO CAPS: 100.0000 mg | ORAL_CAPSULE | Freq: Two times a day (BID) | ORAL | 0 refills | Status: DC

## 2020-09-12 NOTE — ED Triage Notes (Signed)
Pt started cough on Friday. Had tele visit on Monday, was sent 3 prescriptions for cough codeine cough syrup, tessalon pearls, z pack. Cant stop coughing.

## 2020-09-12 NOTE — ED Provider Notes (Signed)
[facility]   [id number] 09/12/20 Arrival Time: 4098   CC: COVID symptoms  SUBJECTIVE: History from: patient.  [name] is a 80 y.o. female who presents with cough, x 7 days. Denies sick exposure to COVID, flu or strep. Denies recent travel. Has negative history of Covid. Has not completed Covid vaccines. Had televisit for the same and was treated with tessalon perles, z pack and codeine cough syrup. Reports that these are not helping. There are no aggravating or alleviating factors. Denies previous symptoms in the past. Denies fever, chills, fatigue, sinus pain, rhinorrhea, sore throat, SOB, wheezing, chest pain, nausea, changes in bowel or bladder habits.    ROS: As per HPI.  All other pertinent ROS negative.     Past Medical History:  Diagnosis Date  . Arthritis   . Bladder spasms    "spastic bladder" wears pads  . Cancer ([place])    Skin cancer -scalp and eyebrow"basal cell"  . Complication of anesthesia    woke up too early from a surgery  . GERD (gastroesophageal reflux disease)    occ. frequent "hiccoughs" after "eating, indigestion"  . Hyperlipemia   . Hypertension   . Hypothyroidism   . Trigger finger, left    alternate fingers affected. -Occ. shooting pain.  . Wears glasses    Past Surgical History:  Procedure Laterality Date  . ABDOMINAL HYSTERECTOMY  1989  . APPENDECTOMY    . CARDIAC CATHETERIZATION  2006   normal-done for work up pre hip surgeries  . CARPAL TUNNEL RELEASE Left   . CHOLECYSTECTOMY  1997  . JOINT REPLACEMENT     BTHA  . POLYPECTOMY Bilateral 06/26/2013   Procedure: BILATERAL EXCISION OF NASAL POLYPS ;  Surgeon: Ascencion Dike, MD;  Location: [facility];  Service: ENT;  Laterality: Bilateral;  . SEPTOPLASTY N/A 06/26/2013   Procedure: AND SEPTOPLASTY;  Surgeon: Ascencion Dike, MD;  Location: [facility];  Service: ENT;  Laterality: N/A;  . THYROIDECTOMY  1994  . TONSILLECTOMY    . TOTAL HIP ARTHROPLASTY   2006   right  . TOTAL HIP ARTHROPLASTY  2006   left  . TOTAL KNEE ARTHROPLASTY Right 12/13/2014   Procedure: RIGHT TOTAL KNEE ARTHROPLASTY;  Surgeon: Gaynelle Arabian, MD;  Location: [facility];  Service: Orthopedics;  Laterality: Right;   Allergies  Allergen Reactions  . Feldene [Piroxicam] Other (See Comments)    CANT REMEMBER  . Ivp Dye [Iodinated Diagnostic Agents] Other (See Comments)    CANT REMEMBER  . Naproxen Nausea And Vomiting  . Sulfa Antibiotics     CANT REMEMBER  . Vicodin [Hydrocodone-Acetaminophen] Nausea And Vomiting   No current facility-administered medications on file prior to encounter.   Current Outpatient Medications on File Prior to Encounter  Medication Sig Dispense Refill  . anastrozole (ARIMIDEX) 1 MG tablet Take 1 mg by mouth daily.    Marland Kitchen aspirin EC 325 MG tablet Take 1 tablet (325 mg total) by mouth daily.    . Calcium Citrate (CITRACAL PO) Take 2 tablets by mouth 2 (two) times daily.     . Cholecalciferol (VITAMIN D) 50 MCG (2000 UT) CAPS Take 1 capsule by mouth 2 (two) times daily.    . Cholecalciferol 1.25 MG (50000 UT) TABS Take by mouth.    . furosemide (LASIX) 20 MG tablet Take 20-40 mg by mouth as needed.    . hydrochlorothiazide (MICROZIDE) 12.5 MG capsule TAKE 1 CAPSULE BY MOUTH EVERY DAY 90 capsule  2  . levothyroxine (SYNTHROID, LEVOTHROID) 125 MCG tablet Take 125 mcg by mouth daily before breakfast.    . loperamide (IMODIUM) 2 MG capsule Take 1 capsule (2 mg total) by mouth 4 (four) times daily as needed for diarrhea or loose stools. 12 capsule 0  . losartan (COZAAR) 50 MG tablet Take 1 tablet (50 mg total) by mouth daily. 90 tablet 3  . metoprolol tartrate (LOPRESSOR) 50 MG tablet Take 50 mg by mouth 2 (two) times daily.    . Multiple Vitamins-Minerals (PRESERVISION AREDS 2 PO) Take 1 tablet by mouth 2 (two) times daily.    Marland Kitchen oxybutynin (DITROPAN) 5 MG tablet Take 5 mg by mouth 2 (two) times daily.    . simvastatin (ZOCOR) 10 MG tablet Take 10 mg by  mouth daily.    Marland Kitchen tolterodine (DETROL LA) 4 MG 24 hr capsule Take 1 capsule (4 mg total) by mouth daily. 90 capsule 3   Social History   Socioeconomic History  . Marital status: Single    Spouse name: Not on file  . Number of children: Not on file  . Years of education: Not on file  . Highest education level: Not on file  Occupational History  . Not on file  Tobacco Use  . Smoking status: Never Smoker  . Smokeless tobacco: Never Used  Substance and Sexual Activity  . Alcohol use: No  . Drug use: No  . Sexual activity: Not Currently  Other Topics Concern  . Not on file  Social History Narrative  . Not on file   Social Determinants of Health   Financial Resource Strain: Not on file  Food Insecurity: Not on file  Transportation Needs: Not on file  Physical Activity: Not on file  Stress: Not on file  Social Connections: Not on file  Intimate Partner Violence: Not on file   No family history on file.  OBJECTIVE:  Vitals:   09/12/20 1800  BP: 119/72  Pulse: 68  Resp: 20  Temp: 98.4 F (36.9 C)  TempSrc: Oral  SpO2: 92%     General appearance: alert; appears fatigued, but nontoxic; speaking in full sentences and tolerating own secretions HEENT: NCAT; Ears: EACs clear, TMs pearly gray; Eyes: PERRL.  EOM grossly intact. Sinuses: nontender; Nose: nares patent with clear rhinorrhea, Throat: oropharynx erythematous, cobblestoning present, tonsils non erythematous or enlarged, uvula midline  Neck: supple without LAD Lungs: unlabored respirations, symmetrical air entry; cough: moderate; no respiratory distress; mild wheezing noted to bilateral lung fields, coarse lung sounds to R lung Heart: regular rate and rhythm.  Radial pulses 2+ symmetrical bilaterally Skin: warm and dry Psychological: alert and cooperative; normal mood and affect  LABS:  No results found for this or any previous visit (from the past 24 hour(s)).   ASSESSMENT & PLAN:  1. Upper respiratory tract  infection, unspecified type   2. Cough   3. Chest discomfort   4. Wheezing   5. Other fatigue    Chest xray today is negative for pneumonia Will treat with doxycycline BID x 7 days since she improved some with previous course of antibiotics and then symptoms worsened again afterward Continue supportive care at home COVID and flu testing ordered.  It will take between 2-3 days for test results. Someone will contact you regarding abnormal results.   Patient should remain in quarantine until they have received Covid results.  If negative you may resume normal activities (go back to work/school) while practicing hand hygiene, social distance, and  mask wearing.  If positive, patient should remain in quarantine for at least 5 days from symptom onset AND greater than 72 hours after symptoms resolution (absence of fever without the use of fever-reducing medication and improvement in respiratory symptoms), whichever is longer Get plenty of rest and push fluids Use OTC zyrtec for nasal congestion, runny nose, and/or sore throat Use OTC flonase for nasal congestion and runny nose Use medications daily for symptom relief Use OTC medications like ibuprofen or tylenol as needed fever or pain Call or go to the ED if you have any new or worsening symptoms such as fever, worsening cough, shortness of breath, chest tightness, chest pain, turning blue, changes in mental status.  Reviewed expectations re: course of current medical issues. Questions answered. Outlined signs and symptoms indicating need for more acute intervention. Patient verbalized understanding. After Visit Summary given.         Faustino Congress, NP 09/14/20 2018

## 2020-09-12 NOTE — Discharge Instructions (Addendum)
Chest xray shows no pneumonia  Prescribed doxycycline for URI  Your COVID and Flu tests are pending.  You should self quarantine until the test results are back.    Take Tylenol or ibuprofen as needed for fever or discomfort.  Rest and keep yourself hydrated.    Follow-up with your primary care provider if your symptoms are not improving.

## 2020-09-17 LAB — COVID-19, FLU A+B NAA
Influenza A, NAA: NOT DETECTED
Influenza B, NAA: NOT DETECTED
SARS-CoV-2, NAA: NOT DETECTED

## 2020-11-13 ENCOUNTER — Other Ambulatory Visit: Payer: Self-pay

## 2020-11-13 DIAGNOSIS — N3946 Mixed incontinence: Secondary | ICD-10-CM

## 2020-11-13 MED ORDER — TOLTERODINE TARTRATE ER 4 MG PO CP24: 4.0000 mg | ORAL_CAPSULE | Freq: Every day | ORAL | 2 refills | Status: DC

## 2020-12-14 ENCOUNTER — Other Ambulatory Visit: Payer: Self-pay

## 2020-12-14 ENCOUNTER — Encounter (HOSPITAL_COMMUNITY): Payer: Self-pay | Admitting: Emergency Medicine

## 2020-12-14 ENCOUNTER — Emergency Department (HOSPITAL_COMMUNITY)
Admission: EM | Admit: 2020-12-14 | Discharge: 2020-12-14 | Disposition: A | Discharge status: Home / Self Care | Payer: Medicare Other | Attending: Emergency Medicine | Admitting: Emergency Medicine

## 2020-12-14 ENCOUNTER — Emergency Department (HOSPITAL_COMMUNITY): Payer: Medicare Other

## 2020-12-14 DIAGNOSIS — Z96643 Presence of artificial hip joint, bilateral: Secondary | ICD-10-CM | POA: Diagnosis not present

## 2020-12-14 DIAGNOSIS — I10 Essential (primary) hypertension: Secondary | ICD-10-CM | POA: Diagnosis not present

## 2020-12-14 DIAGNOSIS — Z85828 Personal history of other malignant neoplasm of skin: Secondary | ICD-10-CM | POA: Diagnosis not present

## 2020-12-14 DIAGNOSIS — M5442 Lumbago with sciatica, left side: Secondary | ICD-10-CM | POA: Diagnosis not present

## 2020-12-14 DIAGNOSIS — I700 Atherosclerosis of aorta: Secondary | ICD-10-CM | POA: Insufficient documentation

## 2020-12-14 DIAGNOSIS — K753 Granulomatous hepatitis, not elsewhere classified: Secondary | ICD-10-CM | POA: Diagnosis not present

## 2020-12-14 DIAGNOSIS — K449 Diaphragmatic hernia without obstruction or gangrene: Secondary | ICD-10-CM | POA: Insufficient documentation

## 2020-12-14 DIAGNOSIS — Z96651 Presence of right artificial knee joint: Secondary | ICD-10-CM | POA: Diagnosis not present

## 2020-12-14 DIAGNOSIS — Z79899 Other long term (current) drug therapy: Secondary | ICD-10-CM | POA: Insufficient documentation

## 2020-12-14 DIAGNOSIS — N281 Cyst of kidney, acquired: Secondary | ICD-10-CM | POA: Diagnosis not present

## 2020-12-14 DIAGNOSIS — Z7982 Long term (current) use of aspirin: Secondary | ICD-10-CM | POA: Insufficient documentation

## 2020-12-14 DIAGNOSIS — E039 Hypothyroidism, unspecified: Secondary | ICD-10-CM | POA: Diagnosis not present

## 2020-12-14 DIAGNOSIS — M545 Low back pain, unspecified: Secondary | ICD-10-CM | POA: Diagnosis present

## 2020-12-14 DIAGNOSIS — K7689 Other specified diseases of liver: Secondary | ICD-10-CM | POA: Diagnosis not present

## 2020-12-14 LAB — CBC WITH DIFFERENTIAL/PLATELET
Abs Immature Granulocytes: 0.04 10*3/uL (ref 0.00–0.07)
Basophils Absolute: 0.1 10*3/uL (ref 0.0–0.1)
Basophils Relative: 1 %
Eosinophils Absolute: 0 10*3/uL (ref 0.0–0.5)
Eosinophils Relative: 1 %
HCT: 42.6 % (ref 36.0–46.0)
Hemoglobin: 13.7 g/dL (ref 12.0–15.0)
Immature Granulocytes: 1 %
Lymphocytes Relative: 10 %
Lymphs Abs: 0.8 10*3/uL (ref 0.7–4.0)
MCH: 29.2 pg (ref 26.0–34.0)
MCHC: 32.2 g/dL (ref 30.0–36.0)
MCV: 90.8 fL (ref 80.0–100.0)
Monocytes Absolute: 0.4 10*3/uL (ref 0.1–1.0)
Monocytes Relative: 5 %
Neutro Abs: 6 10*3/uL (ref 1.7–7.7)
Neutrophils Relative %: 82 %
Platelets: 192 10*3/uL (ref 150–400)
RBC: 4.69 MIL/uL (ref 3.87–5.11)
RDW: 14.2 % (ref 11.5–15.5)
WBC: 7.3 10*3/uL (ref 4.0–10.5)
nRBC: 0 % (ref 0.0–0.2)

## 2020-12-14 LAB — COMPREHENSIVE METABOLIC PANEL
ALT: 19 U/L (ref 0–44)
AST: 19 U/L (ref 15–41)
Albumin: 4 g/dL (ref 3.5–5.0)
Alkaline Phosphatase: 93 U/L (ref 38–126)
Anion gap: 10 (ref 5–15)
BUN: 23 mg/dL (ref 8–23)
CO2: 26 mmol/L (ref 22–32)
Calcium: 9.6 mg/dL (ref 8.9–10.3)
Chloride: 100 mmol/L (ref 98–111)
Creatinine, Ser: 0.74 mg/dL (ref 0.44–1.00)
GFR, Estimated: >60 mL/min (ref >60)
Glucose, Bld: 137 mg/dL — ABNORMAL HIGH (ref 70–99)
Potassium: 3.7 mmol/L (ref 3.5–5.1)
Sodium: 136 mmol/L (ref 135–145)
Total Bilirubin: 0.6 mg/dL (ref 0.3–1.2)
Total Protein: 7.1 g/dL (ref 6.5–8.1)

## 2020-12-14 LAB — URINALYSIS, ROUTINE W REFLEX MICROSCOPIC
Bilirubin Urine: NEGATIVE
Glucose, UA: NEGATIVE mg/dL
Hgb urine dipstick: NEGATIVE
Ketones, ur: NEGATIVE mg/dL
Leukocytes,Ua: NEGATIVE
Nitrite: NEGATIVE
Protein, ur: 30 mg/dL — AB
Specific Gravity, Urine: >1.03 — ABNORMAL HIGH (ref 1.005–1.030)
pH: 5.5 (ref 5.0–8.0)

## 2020-12-14 LAB — URINALYSIS, MICROSCOPIC (REFLEX)

## 2020-12-14 MED ORDER — MORPHINE SULFATE (PF) 4 MG/ML IV SOLN
4.0000 mg | Freq: Once | INTRAVENOUS | Status: DC
  Filled 2020-12-14: qty 1

## 2020-12-14 MED ORDER — MORPHINE SULFATE (PF) 4 MG/ML IV SOLN
4.0000 mg | Freq: Once | INTRAVENOUS | Status: AC
  Administered 2020-12-14: 4 mg via INTRAVENOUS

## 2020-12-14 MED ORDER — PREDNISONE 10 MG (21) PO TBPK: ORAL_TABLET | ORAL | 0 refills | Status: DC

## 2020-12-14 MED ORDER — ONDANSETRON HCL 4 MG/2ML IJ SOLN
4.0000 mg | Freq: Once | INTRAMUSCULAR | Status: AC
  Administered 2020-12-14: 4 mg via INTRAVENOUS
  Filled 2020-12-14: qty 2

## 2020-12-14 MED ORDER — OXYCODONE-ACETAMINOPHEN 5-325 MG PO TABS
1.0000 | ORAL_TABLET | Freq: Once | ORAL | Status: AC
  Administered 2020-12-14: 1 via ORAL
  Filled 2020-12-14: qty 1

## 2020-12-14 MED ORDER — MORPHINE SULFATE (PF) 2 MG/ML IV SOLN
2.0000 mg | Freq: Once | INTRAVENOUS | Status: AC
  Administered 2020-12-14: 2 mg via INTRAMUSCULAR
  Filled 2020-12-14: qty 1

## 2020-12-14 MED ORDER — OXYCODONE HCL 5 MG PO TABS: 2.5000 mg | ORAL_TABLET | Freq: Four times a day (QID) | ORAL | 0 refills | Status: DC | PRN

## 2020-12-14 NOTE — ED Notes (Signed)
Pt ambulated to restroom. 

## 2020-12-14 NOTE — ED Provider Notes (Addendum)
[facility] EMERGENCY DEPARTMENT Provider Note   CSN: [id number] Arrival date & time: 12/14/20  1432     History Chief Complaint  Patient presents with  . Back Pain    [name] is a 80 y.o. female.  HPI      [name] is a 80 y.o. female, with a history of hyperlipidemia, HTN, presenting to the ED with left lower back pain beginning earlier today. She feels as though she has spasms down her left back.  Her pain is sharp or aching, moderate to severe.  Denies fever/chills, falls/trauma, changes in bowel or bladder function, saddle anesthesias, numbness, weakness, abdominal pain, chest pain, upper back pain, shortness of breath, urinary symptoms, or any other complaints.    Past Medical History:  Diagnosis Date  . Arthritis   . Bladder spasms    "spastic bladder" wears pads  . Cancer ([place])    Skin cancer -scalp and eyebrow"basal cell"  . Complication of anesthesia    woke up too early from a surgery  . GERD (gastroesophageal reflux disease)    occ. frequent "hiccoughs" after "eating, indigestion"  . Hyperlipemia   . Hypertension   . Hypothyroidism   . Trigger finger, left    alternate fingers affected. -Occ. shooting pain.  . Wears glasses     Patient Active Problem List   Diagnosis Date Noted  . History of chest pain 03/20/2019  . History of breast cancer 08/14/2018  . Obesity (BMI 30-39.9) 08/14/2018  . Bilateral lower extremity edema 02/14/2018  . Essential hypertension 02/14/2018  . Hyperlipidemia 02/14/2018  . Family history of heart disease 02/14/2018  . Onychomycosis 08/26/2016  . OA (osteoarthritis) of knee 12/13/2014    Past Surgical History:  Procedure Laterality Date  . ABDOMINAL HYSTERECTOMY  1989  . APPENDECTOMY    . CARDIAC CATHETERIZATION  2006   normal-done for work up pre hip surgeries  . CARPAL TUNNEL RELEASE Left   . CHOLECYSTECTOMY  1997  . JOINT REPLACEMENT     BTHA  . POLYPECTOMY Bilateral 06/26/2013   Procedure:  BILATERAL EXCISION OF NASAL POLYPS ;  Surgeon: Ascencion Dike, MD;  Location: [facility];  Service: ENT;  Laterality: Bilateral;  . SEPTOPLASTY N/A 06/26/2013   Procedure: AND SEPTOPLASTY;  Surgeon: Ascencion Dike, MD;  Location: [facility];  Service: ENT;  Laterality: N/A;  . THYROIDECTOMY  1994  . TONSILLECTOMY    . TOTAL HIP ARTHROPLASTY  2006   right  . TOTAL HIP ARTHROPLASTY  2006   left  . TOTAL KNEE ARTHROPLASTY Right 12/13/2014   Procedure: RIGHT TOTAL KNEE ARTHROPLASTY;  Surgeon: Gaynelle Arabian, MD;  Location: [facility];  Service: Orthopedics;  Laterality: Right;     OB History   No obstetric history on file.     History reviewed. No pertinent family history.  Social History   Tobacco Use  . Smoking status: Never Smoker  . Smokeless tobacco: Never Used  Vaping Use  . Vaping Use: Never used  Substance Use Topics  . Alcohol use: No  . Drug use: No    Home Medications Prior to Admission medications   Medication Sig Start Date End Date Taking? Authorizing Provider  anastrozole (ARIMIDEX) 1 MG tablet Take 1 mg by mouth daily.   Yes [provider]  aspirin EC 325 MG tablet Take 1 tablet (325 mg total) by mouth daily. 03/20/19  Yes Lorretta Harp, MD  Calcium Citrate (CITRACAL PO) Take  2 tablets by mouth 2 (two) times daily.    Yes [provider]  Cholecalciferol (VITAMIN D) 50 MCG (2000 UT) CAPS Take 1 capsule by mouth 2 (two) times daily.   Yes [provider]  furosemide (LASIX) 20 MG tablet Take 20-40 mg by mouth as needed.   Yes [provider]  hydrochlorothiazide (MICROZIDE) 12.5 MG capsule TAKE 1 CAPSULE BY MOUTH EVERY DAY 09/09/20  Yes Lorretta Harp, MD  levothyroxine (SYNTHROID, LEVOTHROID) 125 MCG tablet Take 125 mcg by mouth daily before breakfast.   Yes [provider]  loperamide (IMODIUM) 2 MG capsule Take 1 capsule (2 mg total) by mouth 4 (four) times daily as needed for diarrhea or loose  stools. 09/22/19  Yes Avegno, Darrelyn Hillock, FNP  losartan (COZAAR) 50 MG tablet Take 1 tablet (50 mg total) by mouth daily. 08/26/20 11/24/20 Yes Lorretta Harp, MD  metoprolol tartrate (LOPRESSOR) 50 MG tablet Take 50 mg by mouth 2 (two) times daily.   Yes [provider]  Multiple Vitamins-Minerals (PRESERVISION AREDS 2 PO) Take 1 tablet by mouth 2 (two) times daily.   Yes [provider]  oxybutynin (DITROPAN) 5 MG tablet Take 5 mg by mouth 2 (two) times daily.   Yes [provider]  oxyCODONE (ROXICODONE) 5 MG immediate release tablet Take 0.5 tablets (2.5 mg total) by mouth every 6 (six) hours as needed for severe pain. 12/14/20  Yes Ho Parisi C, PA-C  predniSONE (STERAPRED UNI-PAK 21 TAB) 10 MG (21) TBPK tablet Take 6 tabs (60mg ) day 1, 5 tabs (50mg ) day 2, 4 tabs (40mg ) day 3, 3 tabs (30mg ) day 4, 2 tabs (20mg ) day 5, and 1 tab (10mg ) day 6. 12/14/20  Yes Saudia Smyser C, PA-C  simvastatin (ZOCOR) 10 MG tablet Take 10 mg by mouth daily.   Yes [provider]  tolterodine (DETROL LA) 4 MG 24 hr capsule Take 1 capsule (4 mg total) by mouth daily. 11/13/20   Franchot Gallo, MD    Allergies    Feldene [piroxicam], Ivp dye [iodinated diagnostic agents], Sulfa antibiotics, Naproxen, and Vicodin [hydrocodone-acetaminophen]  Review of Systems   Review of Systems  Constitutional: Negative for chills, diaphoresis and fever.  Respiratory: Negative for shortness of breath.   Cardiovascular: Negative for chest pain.  Gastrointestinal: Negative for abdominal pain, diarrhea, nausea and vomiting.  Genitourinary: Negative for difficulty urinating, dysuria, flank pain, frequency and hematuria.  Musculoskeletal: Positive for back pain.  Neurological: Negative for dizziness, syncope, weakness and numbness.  All other systems reviewed and are negative.   Physical Exam Updated Vital Signs BP (!) 168/86 (BP Location: Left Arm)   Pulse 71   Temp 97.7 F (36.5 C) (Oral)    Resp 20   Ht 5\' 2"  (1.575 m)   Wt 95.3 kg   SpO2 93%   BMI 38.41 kg/m   Physical Exam Vitals and nursing note reviewed.  Constitutional:      General: She is not in acute distress.    Appearance: She is well-developed. She is not diaphoretic.  HENT:     Head: Normocephalic and atraumatic.     Mouth/Throat:     Mouth: Mucous membranes are moist.     Pharynx: Oropharynx is clear.  Eyes:     Conjunctiva/sclera: Conjunctivae normal.  Cardiovascular:     Rate and Rhythm: Normal rate and regular rhythm.     Pulses: Normal pulses.          Radial pulses are 2+  on the right side and 2+ on the left side.       Posterior tibial pulses are 2+ on the right side and 2+ on the left side.     Heart sounds: Normal heart sounds.     Comments: Tactile temperature in the extremities appropriate and equal bilaterally. Pulmonary:     Effort: Pulmonary effort is normal. No respiratory distress.     Breath sounds: Normal breath sounds.  Abdominal:     Palpations: Abdomen is soft.     Tenderness: There is no abdominal tenderness. There is no guarding.  Musculoskeletal:     Cervical back: Neck supple.       Back:     Right lower leg: No edema.     Left lower leg: No edema.  Lymphadenopathy:     Cervical: No cervical adenopathy.  Skin:    General: Skin is warm and dry.  Neurological:     Mental Status: She is alert.     Comments: Sensation grossly intact to light touch in the lower extremities bilaterally. No saddle anesthesias. Strength 5/5 in the bilateral lower extremities. No noted gait deficit. Coordination intact.  Psychiatric:        Mood and Affect: Mood and affect normal.        Speech: Speech normal.        Behavior: Behavior normal.     ED Results / Procedures / Treatments   Labs (all labs ordered are listed, but only abnormal results are displayed) Labs Reviewed  COMPREHENSIVE METABOLIC PANEL - Abnormal; Notable for the following components:      Result Value    Glucose, Bld 137 (*)    All other components within normal limits  URINALYSIS, ROUTINE W REFLEX MICROSCOPIC - Abnormal; Notable for the following components:   APPearance HAZY (*)    Specific Gravity, Urine >1.030 (*)    Protein, ur 30 (*)    All other components within normal limits  URINALYSIS, MICROSCOPIC (REFLEX) - Abnormal; Notable for the following components:   Bacteria, UA RARE (*)    All other components within normal limits  CBC WITH DIFFERENTIAL/PLATELET    EKG EKG Interpretation  Date/Time:  Sunday December 14 2020 15:41:26 EDT Ventricular Rate:  72 PR Interval:  196 QRS Duration: 94 QT Interval:  396 QTC Calculation: 433 R Axis:   -2 Text Interpretation: Normal sinus rhythm Minimal voltage criteria for LVH, may be normal variant ( R in aVL ) Nonspecific ST and T wave abnormality Abnormal ECG Confirmed by Octaviano Glow [phone]) on 12/14/2020 3:49:50 PM   Radiology No results found.  CT Renal Stone Study  Result Date: 12/14/2020 CLINICAL DATA:  Left flank pain and left lateral abdominal pain. EXAM: CT ABDOMEN AND PELVIS WITHOUT CONTRAST TECHNIQUE: Multidetector CT imaging of the abdomen and pelvis was performed following the standard protocol without IV contrast. COMPARISON:  None. FINDINGS: Lower chest: No acute abnormality. Hepatobiliary: No focal liver abnormality is seen. Status post cholecystectomy. No biliary dilatation. Scattered hepatic granulomata. Pancreas: Unremarkable. No pancreatic ductal dilatation or surrounding inflammatory changes. Spleen: Normal in size without focal abnormality. Adrenals/Urinary Tract: Normal adrenal glands. Normal appearance of the left kidney and ureters. Bladder is obscured by streak artifact from bilateral hip prostheses. There is a 11.9 x 8.7 by 12.3 cm rim calcified right renal cyst. Stomach/Bowel: Small hiatal hernia. Normal appearance of stomach and small bowel. Post appendectomy. Normal decompressed colon. Vascular/Lymphatic: Aortic  atherosclerosis. No enlarged abdominal or pelvic lymph nodes. Reproductive: Post  hysterectomy. The genitalia is largely obscured by streak artifact. Other: No abdominal wall hernia or abnormality. No abdominopelvic ascites. Musculoskeletal: Bilateral hip arthroplasty with intact prosthetic hardware. Spondylosis of the lumbosacral spine. IMPRESSION: 1. No evidence of obstructive uropathy. The distal ureters and the urinary bladder are not visualized due to streak artifact from bilateral hip prosthesis. 2. 11.9 cm rim calcified right renal cyst. 3. Small hiatal hernia. 4. Aortic atherosclerosis. Aortic Atherosclerosis (ICD10-I70.0). Electronically Signed   By: Fidela Salisbury M.D.   On: 12/14/2020 16:44    Procedures Procedures   Medications Ordered in ED Medications  morphine 2 MG/ML injection 2 mg (2 mg Intramuscular Given 12/14/20 1814)  ondansetron (ZOFRAN) injection 4 mg (4 mg Intravenous Given 12/14/20 1930)  morphine 4 MG/ML injection 4 mg (4 mg Intravenous Given 12/14/20 2007)  oxyCODONE-acetaminophen (PERCOCET/ROXICET) 5-325 MG per tablet 1 tablet (1 tablet Oral Given 12/14/20 2338)    ED Course  I have reviewed the triage vital signs and the nursing notes.  Pertinent labs & imaging results that were available during my care of the patient were reviewed by me and considered in my medical decision making (see chart for details).    MDM Rules/Calculators/A&P                          Patient presents with right lower back pain.  No evidence of neurovascular compromise.  No findings to suggest neurosurgical emergency, such as cauda equina. Pain is reproducible on exam.  Alternative diagnoses, such as dissection, were considered but felt less likely as the presentation and vital signs are not suggestive, she has a lack of risk factors, equal pulses, and no neurologic deficits. The patient was given instructions for home care as well as return precautions. Patient voices understanding of  these instructions, accepts the plan, and is comfortable with discharge.  Findings and plan of care discussed with attending physician, Octaviano Glow, MD. Dr. Langston Masker personally evaluated and examined this patient.  Vitals:   12/14/20 1830 12/14/20 1915 12/14/20 2047 12/14/20 2333  BP: (!) 157/73 (!) 167/60 (!) 162/71 132/88  Pulse: 80 75 63 70  Resp: 19 15 16 16   Temp:      TempSrc:      SpO2: 95% 96% 95% 96%  Weight:      Height:         Final Clinical Impression(s) / ED Diagnoses Final diagnoses:  Acute left-sided low back pain with left-sided sciatica    Rx / DC Orders ED Discharge Orders         Ordered    predniSONE (STERAPRED UNI-PAK 21 TAB) 10 MG (21) TBPK tablet        12/14/20 2303    oxyCODONE (ROXICODONE) 5 MG immediate release tablet  Every 6 hours PRN        12/14/20 2306           Lorayne Bender, PA-C 12/17/20 1659    Lorayne Bender, PA-C 12/17/20 1700    Lorayne Bender, PA-C 12/17/20 1702    Wyvonnia Dusky, MD 12/27/20 1635

## 2020-12-14 NOTE — Discharge Instructions (Signed)
  Take it easy, but do not lay around too much as this may make any stiffness worse.  Antiinflammatory medications: Take 400-600 mg of ibuprofen every 6 hours for the next 3 days. After this time, this medication may be used as needed for pain. Take this medication with food to avoid upset stomach.  Acetaminophen (generic for Tylenol): Should you continue to have additional pain while taking the ibuprofen, you may add in acetaminophen as needed. Your daily total maximum amount of acetaminophen from all sources should be limited to 4000mg /day for persons without liver problems, or 2000mg /day for those with liver problems. Oxycodone: May take oxycodone as needed for severe pain.   Do not drive or perform other dangerous activities while taking this medication as it can cause drowsiness as well as changes in reaction time and judgement.  Lidocaine patches: These are available via either prescription or over-the-counter. The over-the-counter option may be more economical one and are likely just as effective. There are multiple over-the-counter brands, such as Salonpas. Prednisone: Take the prednisone, as prescribed, until finished. If you are a diabetic, please know prednisone can raise your blood sugar temporarily. Ice: May apply ice to the area over the next 24 hours for 15 minutes at a time to reduce pain, inflammation, and swelling, if present. Exercises: Be sure to perform the attached exercises starting with three times a week and working up to performing them daily. This is an essential part of preventing long term problems.  Follow up: Follow up with a primary care provider for any future management of these complaints. Be sure to follow up within 7-10 days. Return: Return to the ED should symptoms worsen.  For prescription assistance, may try using prescription discount sites or apps, such as goodrx.com

## 2020-12-14 NOTE — ED Triage Notes (Signed)
Pt c/o back pain radiating to her left side that began this morning.

## 2020-12-14 NOTE — ED Provider Notes (Signed)
MSE was initiated and I personally evaluated the patient and placed orders (if any) at  3:52 PM on December 14, 2020.  Patient is a 80 year old female who presents the emergency department due to sudden onset left lateral abdominal pain.  She states her pain is nonradiating.  It is constant but worsens with movement.  She reports intermittent nausea without vomiting.  No diarrhea.  No urinary complaints.  No history of kidney stones.  Ordered basic labs, UA, CT renal stone study.  IM morphine for pain.  The patient appears stable so that the remainder of the MSE may be completed by another provider.   Rayna Sexton, PA-C 12/14/20 1553    Wyvonnia Dusky, MD 12/15/20 1022

## 2020-12-18 DIAGNOSIS — Z6837 Body mass index (BMI) 37.0-37.9, adult: Secondary | ICD-10-CM | POA: Diagnosis not present

## 2020-12-18 DIAGNOSIS — S39012A Strain of muscle, fascia and tendon of lower back, initial encounter: Secondary | ICD-10-CM | POA: Diagnosis not present

## 2020-12-18 DIAGNOSIS — Z1331 Encounter for screening for depression: Secondary | ICD-10-CM | POA: Diagnosis not present

## 2020-12-18 DIAGNOSIS — R32 Unspecified urinary incontinence: Secondary | ICD-10-CM | POA: Diagnosis not present

## 2021-01-26 DIAGNOSIS — M8588 Other specified disorders of bone density and structure, other site: Secondary | ICD-10-CM | POA: Diagnosis not present

## 2021-01-26 DIAGNOSIS — C50912 Malignant neoplasm of unspecified site of left female breast: Secondary | ICD-10-CM | POA: Diagnosis not present

## 2021-01-26 DIAGNOSIS — Z780 Asymptomatic menopausal state: Secondary | ICD-10-CM | POA: Diagnosis not present

## 2021-02-23 DIAGNOSIS — Z853 Personal history of malignant neoplasm of breast: Secondary | ICD-10-CM | POA: Diagnosis not present

## 2021-02-23 DIAGNOSIS — Z888 Allergy status to other drugs, medicaments and biological substances status: Secondary | ICD-10-CM | POA: Diagnosis not present

## 2021-02-23 DIAGNOSIS — M85832 Other specified disorders of bone density and structure, left forearm: Secondary | ICD-10-CM | POA: Diagnosis not present

## 2021-02-23 DIAGNOSIS — Z08 Encounter for follow-up examination after completed treatment for malignant neoplasm: Secondary | ICD-10-CM | POA: Diagnosis not present

## 2021-02-23 DIAGNOSIS — C50912 Malignant neoplasm of unspecified site of left female breast: Secondary | ICD-10-CM | POA: Diagnosis not present

## 2021-02-23 DIAGNOSIS — Z85828 Personal history of other malignant neoplasm of skin: Secondary | ICD-10-CM | POA: Diagnosis not present

## 2021-02-23 DIAGNOSIS — Z79811 Long term (current) use of aromatase inhibitors: Secondary | ICD-10-CM | POA: Diagnosis not present

## 2021-02-23 DIAGNOSIS — Z885 Allergy status to narcotic agent status: Secondary | ICD-10-CM | POA: Diagnosis not present

## 2021-02-23 DIAGNOSIS — Z886 Allergy status to analgesic agent status: Secondary | ICD-10-CM | POA: Diagnosis not present

## 2021-02-23 DIAGNOSIS — Z91040 Latex allergy status: Secondary | ICD-10-CM | POA: Diagnosis not present

## 2021-02-26 DIAGNOSIS — U071 COVID-19: Secondary | ICD-10-CM | POA: Diagnosis not present

## 2021-03-18 DIAGNOSIS — D225 Melanocytic nevi of trunk: Secondary | ICD-10-CM | POA: Diagnosis not present

## 2021-03-18 DIAGNOSIS — Z1283 Encounter for screening for malignant neoplasm of skin: Secondary | ICD-10-CM | POA: Diagnosis not present

## 2021-03-18 DIAGNOSIS — L820 Inflamed seborrheic keratosis: Secondary | ICD-10-CM | POA: Diagnosis not present

## 2021-03-18 DIAGNOSIS — Z85828 Personal history of other malignant neoplasm of skin: Secondary | ICD-10-CM | POA: Diagnosis not present

## 2021-03-18 DIAGNOSIS — Z08 Encounter for follow-up examination after completed treatment for malignant neoplasm: Secondary | ICD-10-CM | POA: Diagnosis not present

## 2021-03-18 DIAGNOSIS — C4441 Basal cell carcinoma of skin of scalp and neck: Secondary | ICD-10-CM | POA: Diagnosis not present

## 2021-03-29 IMAGING — DX DG CHEST 2V
2 series · 2 of 2 positions shown · non-contrast
Comparison: Chest radiograph dated 05/10/2008.

CLINICAL DATA: 79-year-old female with cough.

EXAM:
CHEST - 2 VIEW

[chest pa]
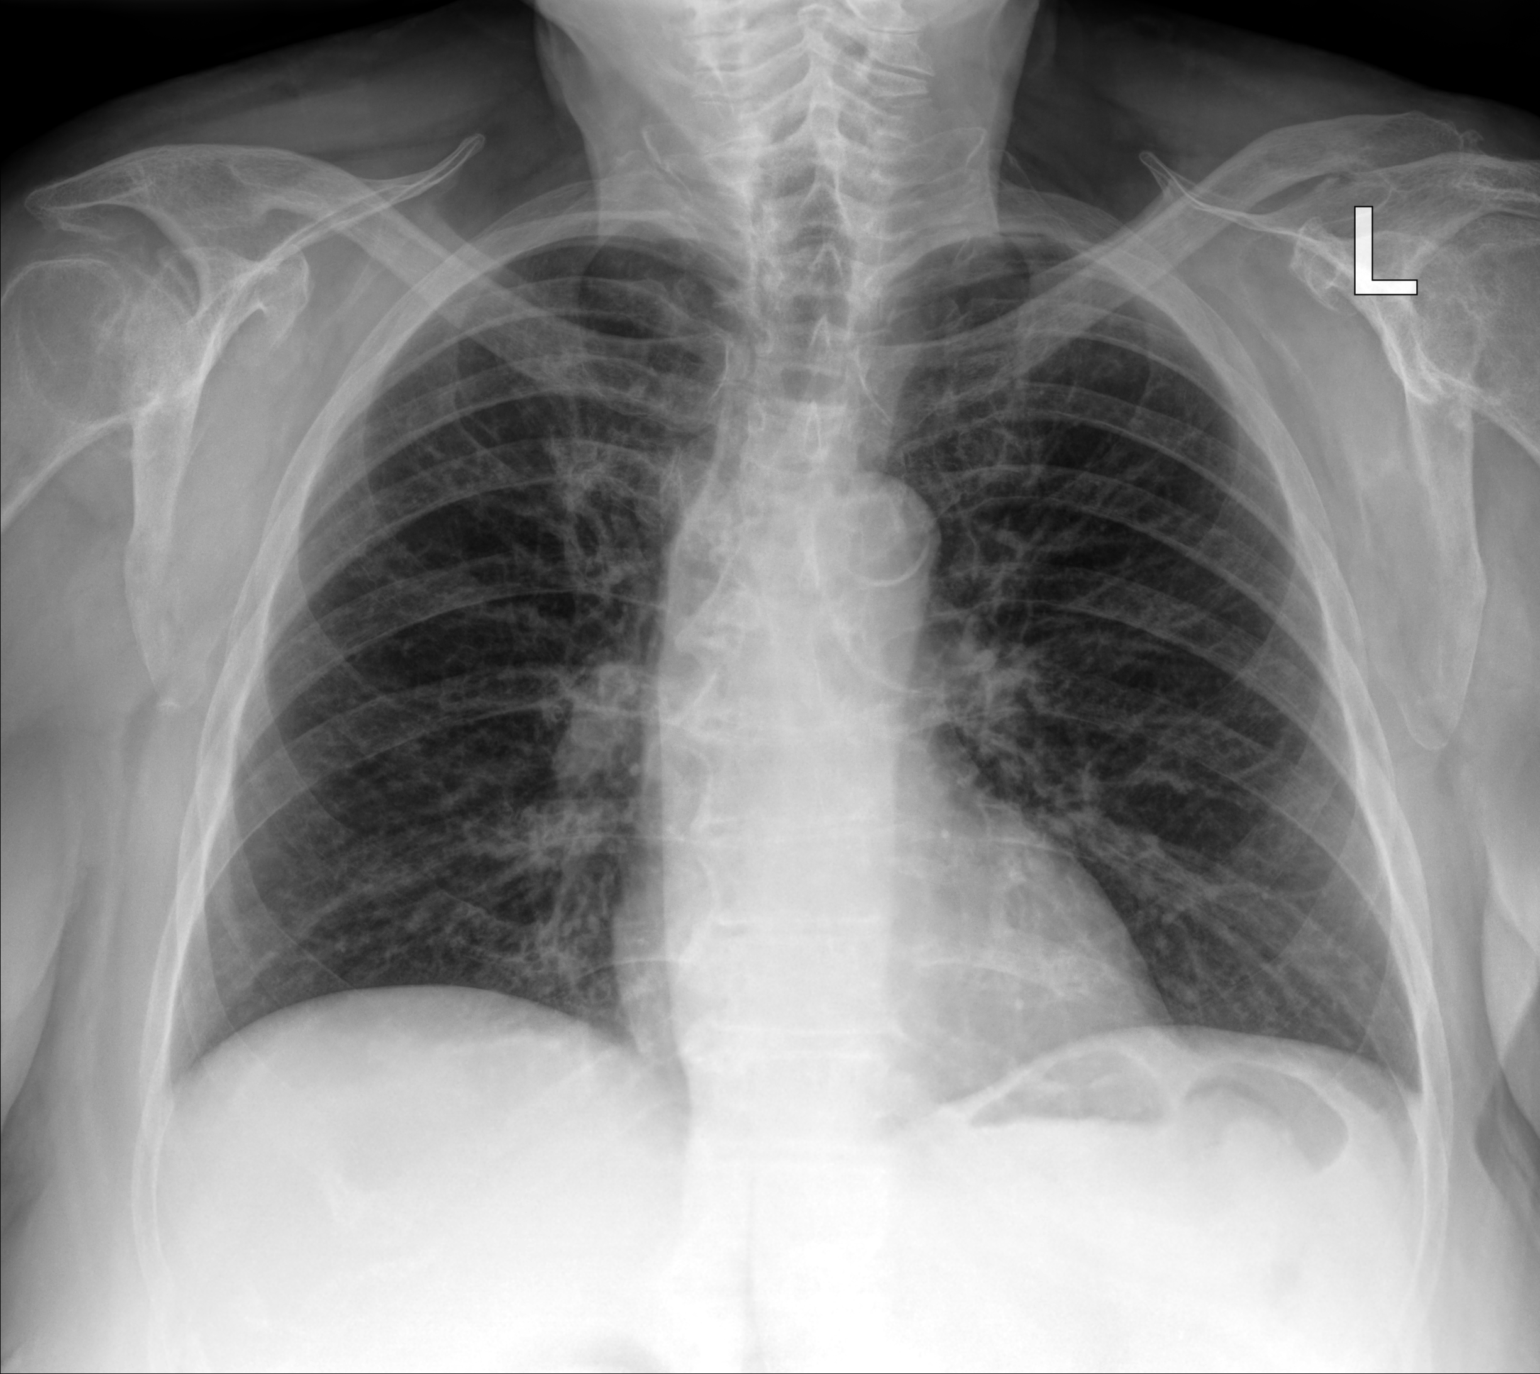

[chest lat]
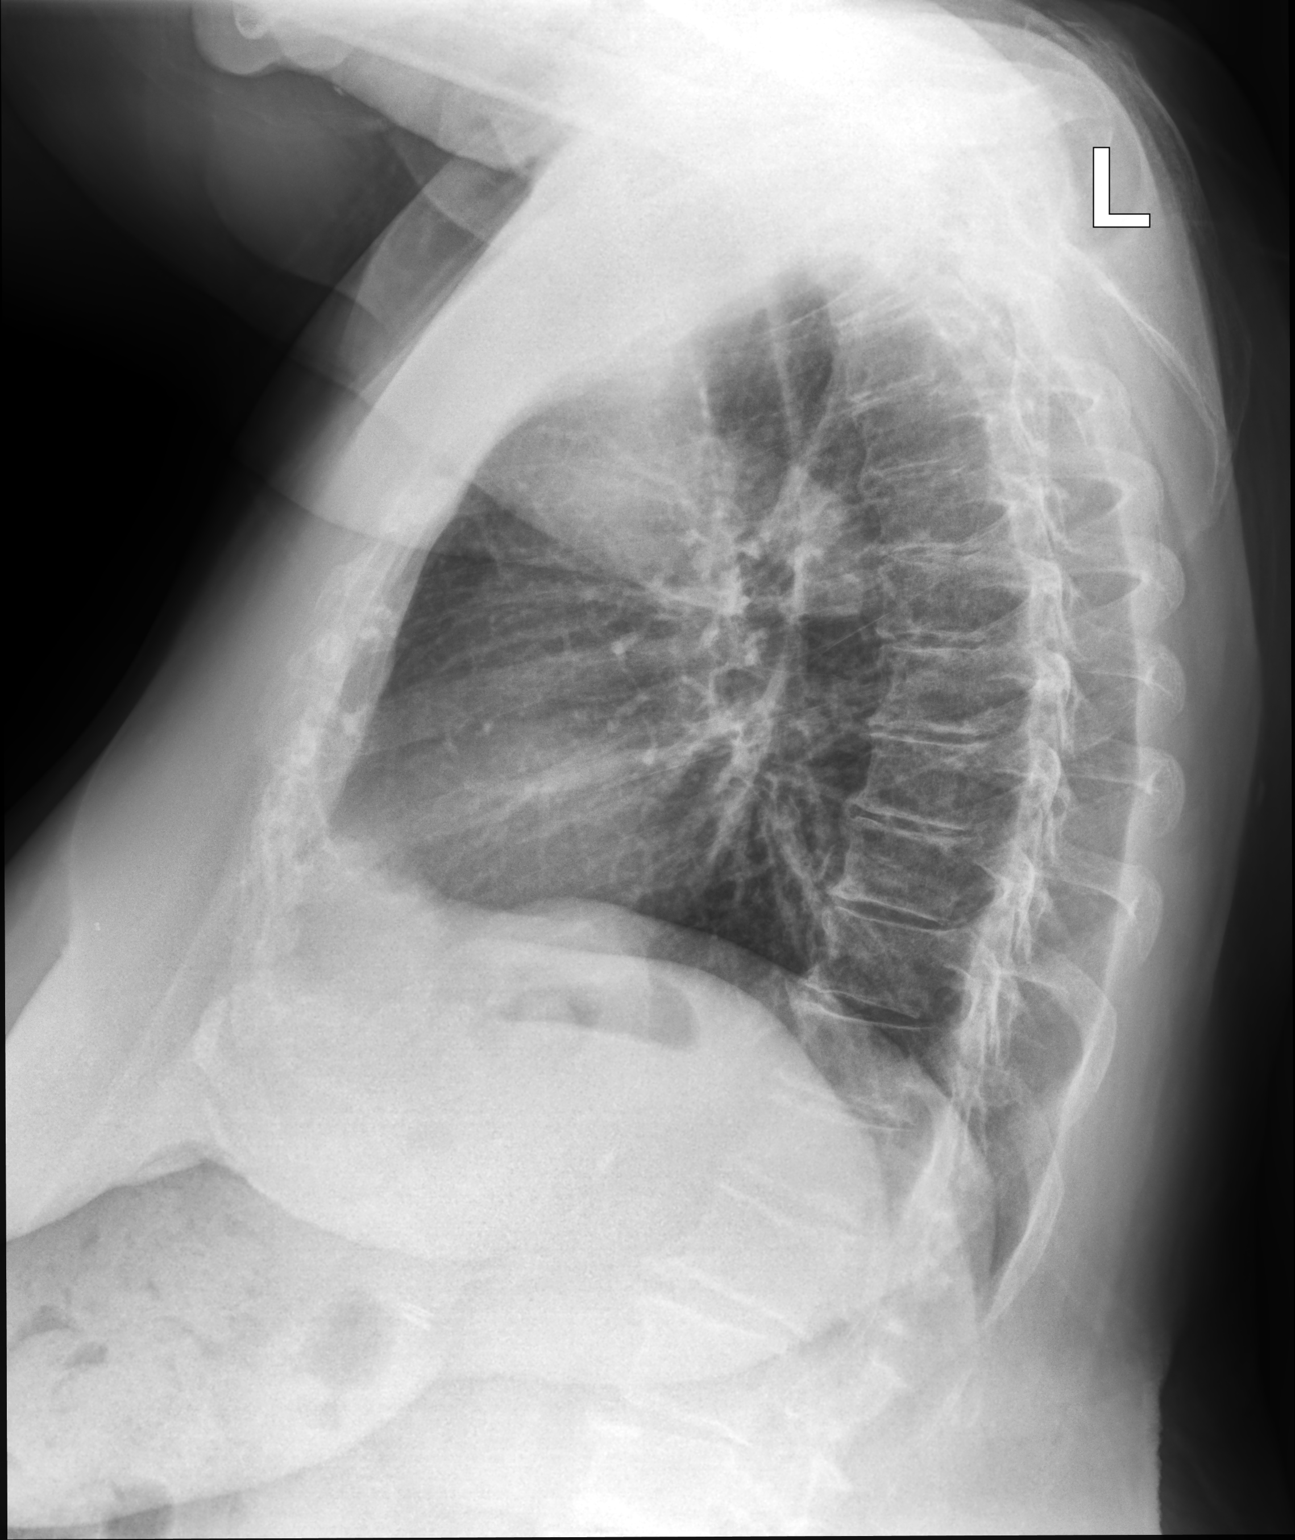

[2 of 2 positions shown; findings below may reference images not displayed]

FINDINGS: No focal consolidation, pleural effusion, or pneumothorax. The
cardiac silhouette is within normal limits. Atherosclerotic
calcification of the aorta. Degenerative changes of the spine. No
acute osseous pathology.
IMPRESSION: No active cardiopulmonary disease.

## 2021-04-16 DIAGNOSIS — M1712 Unilateral primary osteoarthritis, left knee: Secondary | ICD-10-CM | POA: Diagnosis not present

## 2021-04-16 DIAGNOSIS — Z96643 Presence of artificial hip joint, bilateral: Secondary | ICD-10-CM | POA: Diagnosis not present

## 2021-04-16 DIAGNOSIS — Z96651 Presence of right artificial knee joint: Secondary | ICD-10-CM | POA: Diagnosis not present

## 2021-04-23 ENCOUNTER — Telehealth: Payer: Self-pay

## 2021-04-23 DIAGNOSIS — Z08 Encounter for follow-up examination after completed treatment for malignant neoplasm: Secondary | ICD-10-CM | POA: Diagnosis not present

## 2021-04-23 DIAGNOSIS — Z85828 Personal history of other malignant neoplasm of skin: Secondary | ICD-10-CM | POA: Diagnosis not present

## 2021-04-23 NOTE — Telephone Encounter (Signed)
   [facility] Pre-operative Risk Assessment    Patient Name: JENY NIELD  DOB: 1940-10-16 MRN: [id number]  HEARTCARE STAFF:  - IMPORTANT!!!!!! Under Visit Info/Reason for Call, type in Other and utilize the format Clearance MM/DD/YY or Clearance TBD. Do not use dashes or single digits. - Please review there is not already an duplicate clearance open for this procedure. - If request is for dental extraction, please clarify the # of teeth to be extracted. - If the patient is currently at the dentist's office, call Pre-Op Callback Staff (MA/nurse) to input urgent request.  - If the patient is not currently in the dentist office, please route to the Pre-Op pool.  Request for surgical clearance:  What type of surgery is being performed? Left total knee arthroplasty   When is this surgery scheduled? 07/13/21  What type of clearance is required (medical clearance vs. Pharmacy clearance to hold med vs. Both)? Both   Are there any medications that need to be held prior to surgery and how long? ASA  Practice name and name of physician performing surgery? [facility], Dr. Gaynelle Arabian  What is the office phone number? [phone]   7.   What is the office fax number? [phone]  8.   Anesthesia type (None, local, MAC, general) ? Choice    Jacqulynn Cadet 04/23/2021, 9:59 AM  _________________________________________________________________   (provider comments below)

## 2021-04-24 NOTE — Telephone Encounter (Signed)
Primary Cardiologist:Jonathan Gwenlyn Found, MD  Chart reviewed as part of pre-operative protocol coverage. Because of [name]'s past medical history and time since last visit, he/she will require a follow-up visit in order to better assess preoperative cardiovascular risk.  Pre-op covering staff: - Please schedule appointment and call patient to inform them. - Please contact requesting surgeon's office via preferred method (i.e, phone, fax) to inform them of need for appointment prior to surgery.  If applicable, this message will also be routed to pharmacy pool and/or primary cardiologist for input on holding anticoagulant/antiplatelet agent as requested below so that this information is available at time of patient's appointment.   Deberah Pelton, NP  04/24/2021, 8:36 AM

## 2021-04-24 NOTE — Telephone Encounter (Signed)
Pt already has appt scheduled 10-25 @ 945am-added to appt notes.   Called pt to inform. Verbalized understanding. She will arrive early

## 2021-05-07 DIAGNOSIS — M1991 Primary osteoarthritis, unspecified site: Secondary | ICD-10-CM | POA: Diagnosis not present

## 2021-05-07 DIAGNOSIS — H3530 Unspecified macular degeneration: Secondary | ICD-10-CM | POA: Diagnosis not present

## 2021-05-07 DIAGNOSIS — Z6837 Body mass index (BMI) 37.0-37.9, adult: Secondary | ICD-10-CM | POA: Diagnosis not present

## 2021-05-07 DIAGNOSIS — M810 Age-related osteoporosis without current pathological fracture: Secondary | ICD-10-CM | POA: Diagnosis not present

## 2021-05-07 DIAGNOSIS — Z01810 Encounter for preprocedural cardiovascular examination: Secondary | ICD-10-CM | POA: Diagnosis not present

## 2021-05-07 DIAGNOSIS — Z1331 Encounter for screening for depression: Secondary | ICD-10-CM | POA: Diagnosis not present

## 2021-05-07 DIAGNOSIS — E782 Mixed hyperlipidemia: Secondary | ICD-10-CM | POA: Diagnosis not present

## 2021-05-07 DIAGNOSIS — E039 Hypothyroidism, unspecified: Secondary | ICD-10-CM | POA: Diagnosis not present

## 2021-05-07 DIAGNOSIS — I10 Essential (primary) hypertension: Secondary | ICD-10-CM | POA: Diagnosis not present

## 2021-06-09 DIAGNOSIS — Z1331 Encounter for screening for depression: Secondary | ICD-10-CM | POA: Diagnosis not present

## 2021-06-09 DIAGNOSIS — H3530 Unspecified macular degeneration: Secondary | ICD-10-CM | POA: Diagnosis not present

## 2021-06-09 DIAGNOSIS — E559 Vitamin D deficiency, unspecified: Secondary | ICD-10-CM | POA: Diagnosis not present

## 2021-06-09 DIAGNOSIS — Z0001 Encounter for general adult medical examination with abnormal findings: Secondary | ICD-10-CM | POA: Diagnosis not present

## 2021-06-09 DIAGNOSIS — E039 Hypothyroidism, unspecified: Secondary | ICD-10-CM | POA: Diagnosis not present

## 2021-06-09 DIAGNOSIS — E7849 Other hyperlipidemia: Secondary | ICD-10-CM | POA: Diagnosis not present

## 2021-06-09 DIAGNOSIS — M1991 Primary osteoarthritis, unspecified site: Secondary | ICD-10-CM | POA: Diagnosis not present

## 2021-06-09 DIAGNOSIS — I10 Essential (primary) hypertension: Secondary | ICD-10-CM | POA: Diagnosis not present

## 2021-06-09 DIAGNOSIS — R7309 Other abnormal glucose: Secondary | ICD-10-CM | POA: Diagnosis not present

## 2021-06-09 DIAGNOSIS — E782 Mixed hyperlipidemia: Secondary | ICD-10-CM | POA: Diagnosis not present

## 2021-06-14 ENCOUNTER — Other Ambulatory Visit: Payer: Self-pay | Admitting: Cardiovascular Disease

## 2021-06-17 ENCOUNTER — Other Ambulatory Visit: Payer: Self-pay | Admitting: Urology

## 2021-06-17 DIAGNOSIS — N3946 Mixed incontinence: Secondary | ICD-10-CM

## 2021-06-18 DIAGNOSIS — Z23 Encounter for immunization: Secondary | ICD-10-CM | POA: Diagnosis not present

## 2021-06-18 NOTE — Telephone Encounter (Signed)
refilled 

## 2021-06-30 ENCOUNTER — Ambulatory Visit (INDEPENDENT_AMBULATORY_CARE_PROVIDER_SITE_OTHER): Payer: Medicare Other | Admitting: Cardiovascular Disease

## 2021-06-30 ENCOUNTER — Other Ambulatory Visit: Payer: Self-pay

## 2021-06-30 ENCOUNTER — Encounter: Payer: Self-pay | Admitting: Cardiovascular Disease

## 2021-06-30 VITALS — BP 142/90 | HR 65 | Ht 62.0 in | Wt 214.0 lb

## 2021-06-30 DIAGNOSIS — E782 Mixed hyperlipidemia: Secondary | ICD-10-CM | POA: Diagnosis not present

## 2021-06-30 DIAGNOSIS — Z87898 Personal history of other specified conditions: Secondary | ICD-10-CM | POA: Diagnosis not present

## 2021-06-30 DIAGNOSIS — I10 Essential (primary) hypertension: Secondary | ICD-10-CM

## 2021-06-30 IMAGING — CT CT RENAL STONE PROTOCOL
3 of 7 series · 17 of 46 positions shown, 19 images · non-contrast
Comparison: None.

CLINICAL DATA: Left flank pain and left lateral abdominal pain.

EXAM:
CT ABDOMEN AND PELVIS WITHOUT CONTRAST
TECHNIQUE: Multidetector CT imaging of the abdomen and pelvis was performed
following the standard protocol without IV contrast.

[Series 2: axial st · axial · 0.83mm/px · z∈[+879,+1264]mm · 12 of 92 slices shown, 14 images]
[im 8/92  soft-tissue]
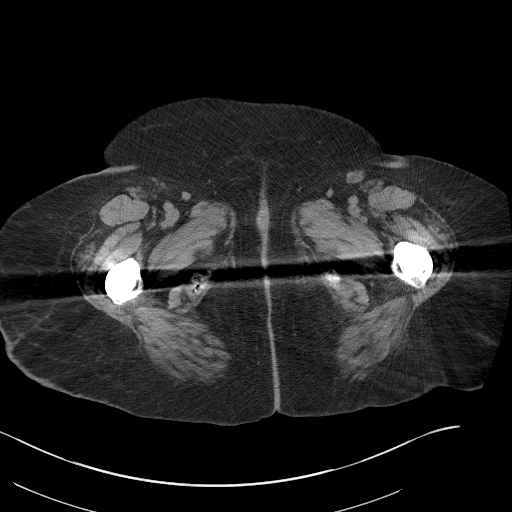
[im 8/92  bone]
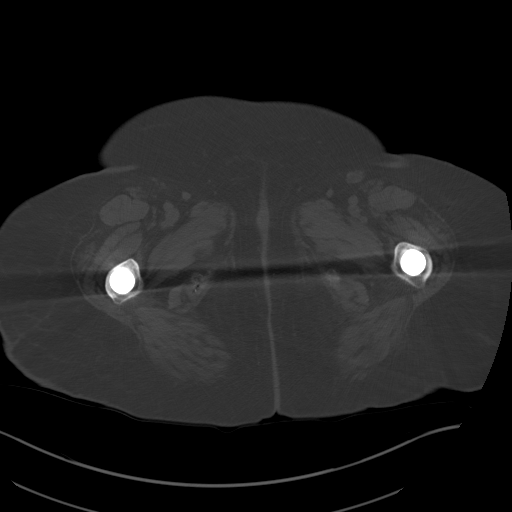
[im 15/92  soft-tissue]
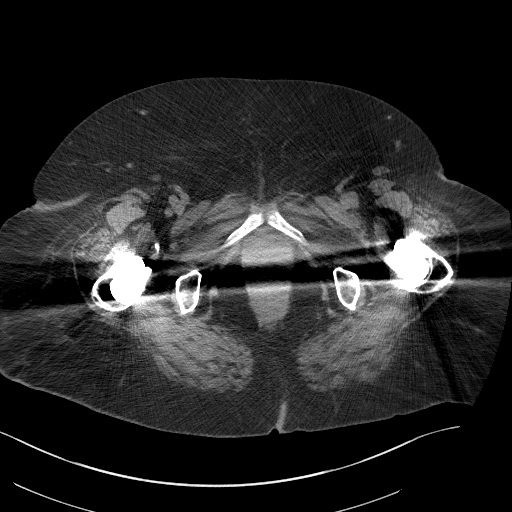
[im 22/92  soft-tissue]
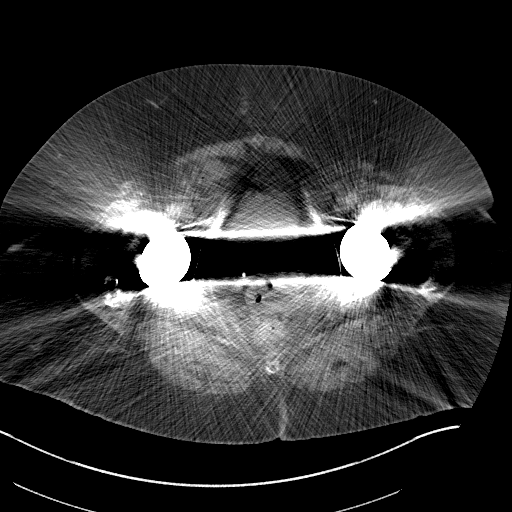
[im 29/92  soft-tissue]
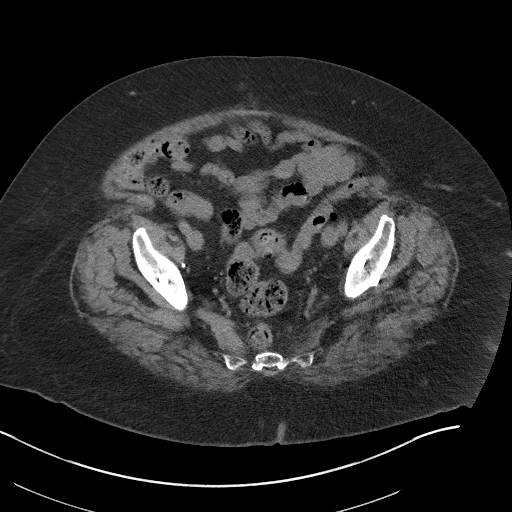
[im 36/92  soft-tissue]
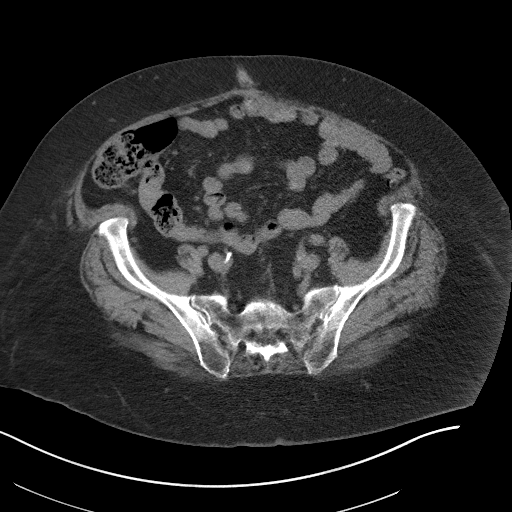
[im 43/92  soft-tissue]
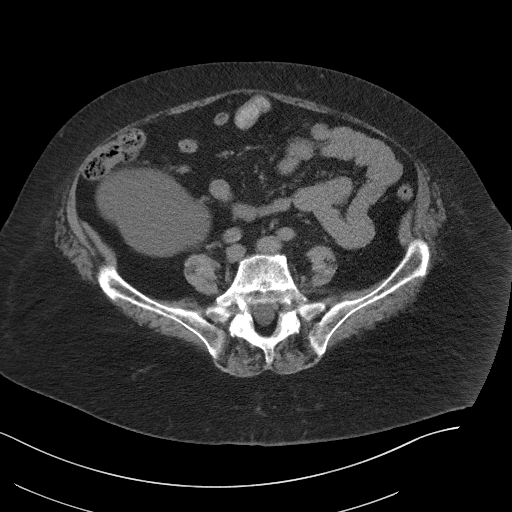
[im 50/92  soft-tissue]
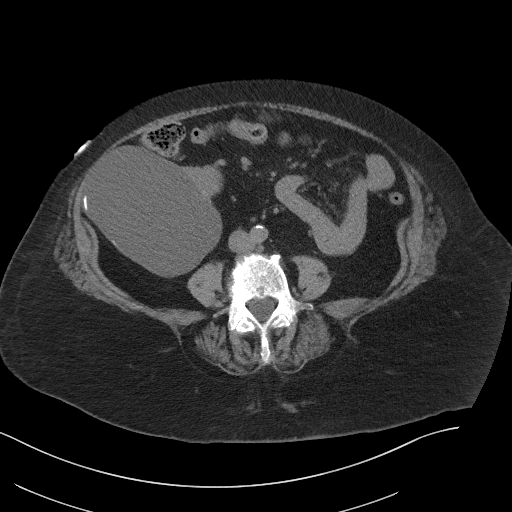
[im 57/92  soft-tissue]
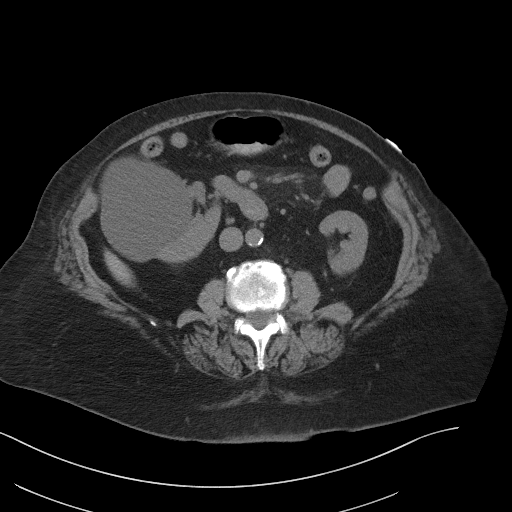
[im 64/92  soft-tissue]
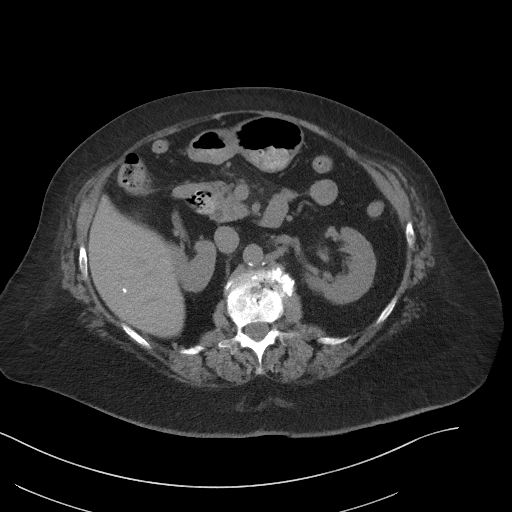
[im 64/92  bone]
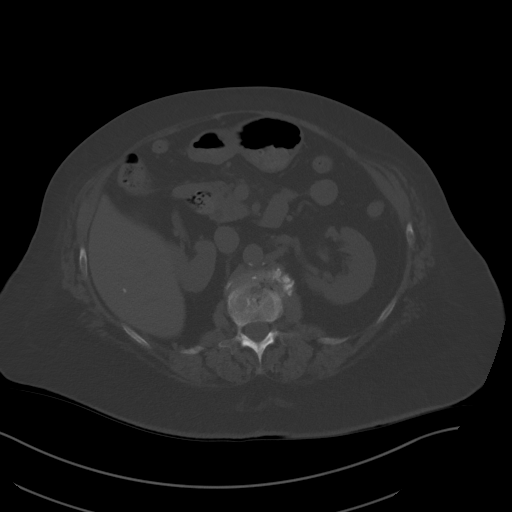
[im 71/92  soft-tissue]
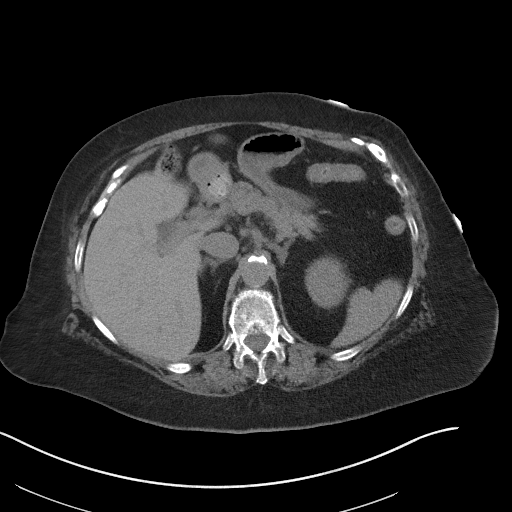
[im 78/92  soft-tissue]
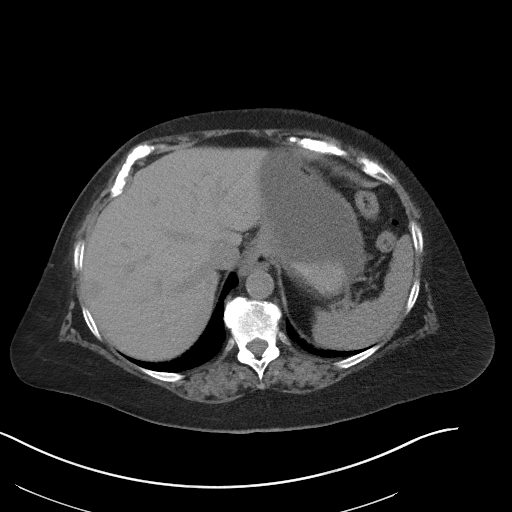
[im 85/92  soft-tissue]
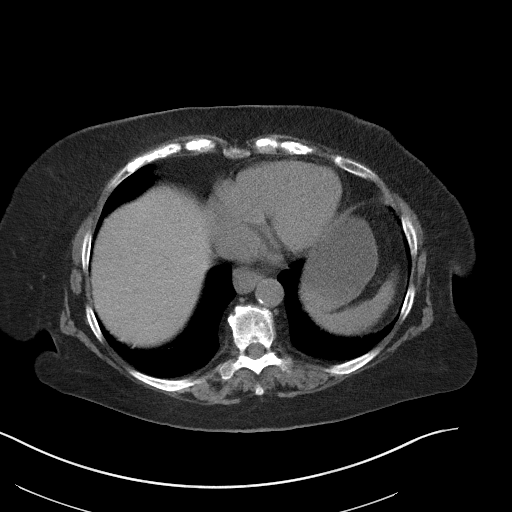

[Series 4: lung bases · axial · 0.83mm/px · z∈[+1119,+1145]mm · 2 of 97 slices shown]
[im 7/97  bone]
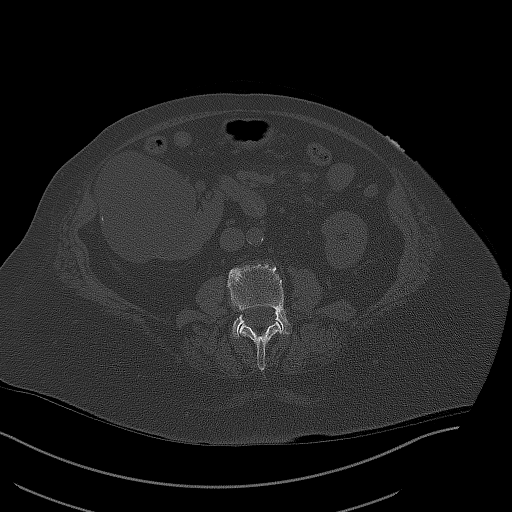
[im 20/97  bone]
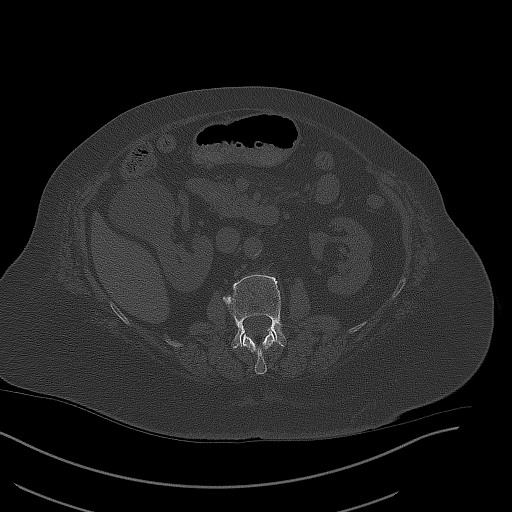

[Series 5: coronal st · coronal · 0.85mm/px · 3 of 115 slices shown]
[im 23/115  soft-tissue]
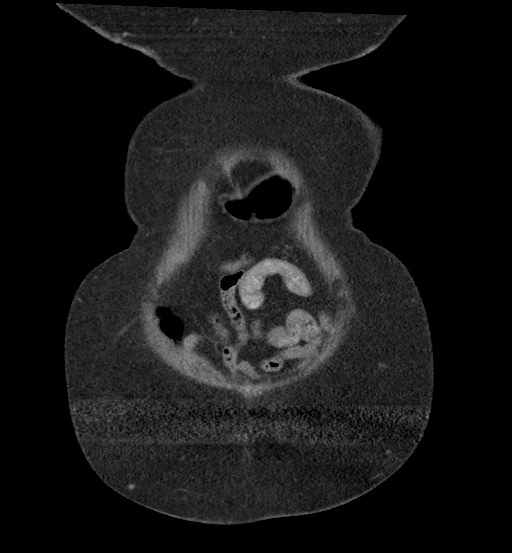
[im 46/115  soft-tissue]
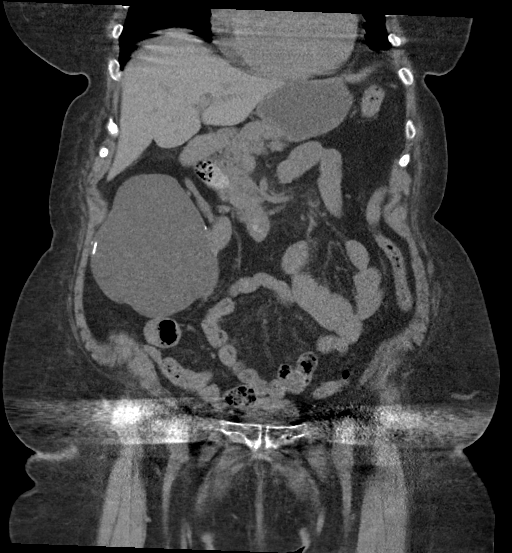
[im 69/115  soft-tissue]
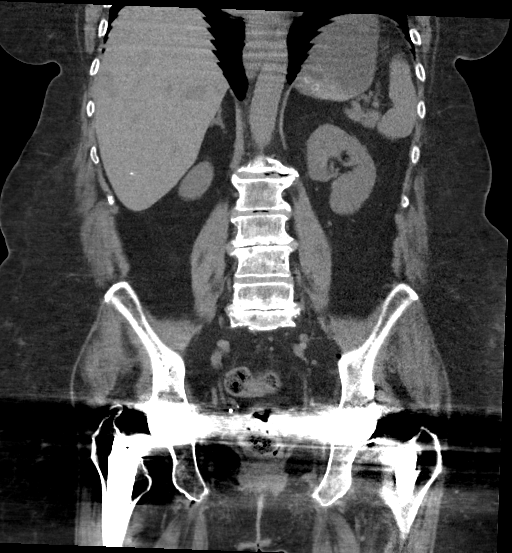

[17 of 46 positions shown; findings below may reference images not displayed]

FINDINGS: Lower chest: No acute abnormality.

Hepatobiliary: No focal liver abnormality is seen. Status post
cholecystectomy. No biliary dilatation. Scattered hepatic
granulomata.

Pancreas: Unremarkable. No pancreatic ductal dilatation or
surrounding inflammatory changes.

Spleen: Normal in size without focal abnormality.

Adrenals/Urinary Tract: Normal adrenal glands. Normal appearance of
the left kidney and ureters. Bladder is obscured by streak artifact
from bilateral hip prostheses. There is a 11.9 x 8.7 by 12.3 cm rim
calcified right renal cyst.

Stomach/Bowel: Small hiatal hernia. Normal appearance of stomach and
small bowel. Post appendectomy. Normal decompressed colon.

Vascular/Lymphatic: Aortic atherosclerosis. No enlarged abdominal or
pelvic lymph nodes.

Reproductive: Post hysterectomy. The genitalia is largely obscured
by streak artifact.

Other: No abdominal wall hernia or abnormality. No abdominopelvic
ascites.

Musculoskeletal: Bilateral hip arthroplasty with intact prosthetic
hardware. Spondylosis of the lumbosacral spine.
IMPRESSION: 1. No evidence of obstructive uropathy. The distal ureters and the
urinary bladder are not visualized due to streak artifact from
bilateral hip prosthesis.
2. 11.9 cm rim calcified right renal cyst.
3. Small hiatal hernia.
4. Aortic atherosclerosis.

Aortic Atherosclerosis (0ARG1-D50.0).

## 2021-06-30 NOTE — Progress Notes (Signed)
06/30/2021 [name]   1940-10-09  [id number]  Primary Physician Sharilyn Sites, MD Primary Cardiologist: Lorretta Harp MD Garret Reddish, [place], [place]  HPI:  [name] is a 80 y.o.  mildly overweight single Caucasian female with no children referred by Collene Mares, PA-C to evaluate bilateral lower extremity edema.  I last saw her in the office 06/25/2020.  Her mother [name] was also a patient of mine who unfortunately passed away at age 32 on 2020-04-14.  She does have a history of treated hypertension and hyperlipidemia.  She is never had a heart attack or stroke.  Her mother does have CAD and she is my patient.  She denies chest pain or shortness of breath.  She is had lower extremity edema for the last several weeks with a recent BMP drawn by her PCP of 144.  She was begun on as needed Lasix which has improved her swelling somewhat.   She developed some chest pain occurring several times a week lasting minutes at a time.  I did perform Myoview stress testing 04/04/2019 which was low risk and nonischemic.   Since I saw her a year ago she is remained asymptomatic.  Unfortunately, she lost her mother [name] on April 14, 2020 who was also a longtime patient of mine.  Since I saw her a year ago she remains well.  She is scheduled for a left total knee replacement by Dr. Wynelle Link 07/13/2021 which she is cleared for at low risk.  She has had bilateral total hip replacements back in 06 and right total knee replacement in 2016.   Current Meds  Medication Sig   anastrozole (ARIMIDEX) 1 MG tablet Take 1 mg by mouth daily.   Calcium Citrate (CITRACAL PO) Take 2 tablets by mouth 2 (two) times daily. 500 mg   Cholecalciferol (VITAMIN D) 50 MCG (2000 UT) CAPS Take 1 capsule by mouth 2 (two) times daily.   hydrochlorothiazide (MICROZIDE) 12.5 MG capsule TAKE 1 CAPSULE BY MOUTH EVERY DAY   levothyroxine (SYNTHROID, LEVOTHROID) 125 MCG tablet Take 125 mcg by mouth daily before breakfast.    losartan (COZAAR) 50 MG tablet Take 1 tablet (50 mg total) by mouth daily.   metoprolol tartrate (LOPRESSOR) 50 MG tablet Take 50 mg by mouth 2 (two) times daily.   Multiple Vitamins-Minerals (PRESERVISION AREDS 2 PO) Take 1 tablet by mouth 2 (two) times daily.   simvastatin (ZOCOR) 10 MG tablet Take 10 mg by mouth at bedtime.   tolterodine (DETROL LA) 4 MG 24 hr capsule TAKE ONE CAPSULE BY MOUTH DAILY   Vitamins A & D (VITAMIN A & D) ointment Apply 1 application topically daily as needed ([place] (Skin Chafing)).     Allergies  Allergen Reactions   Feldene [Piroxicam] Other (See Comments)    CANT REMEMBER   Gabapentin Other (See Comments)    Gi upset   Ivp Dye [Iodinated Diagnostic Agents] Other (See Comments)    CANT REMEMBER   Latex Other (See Comments)    Tickle nose   Sulfa Antibiotics     CANT REMEMBER   Naproxen Nausea And Vomiting   Vicodin [Hydrocodone-Acetaminophen] Nausea And Vomiting    Social History   Socioeconomic History   Marital status: Single    Spouse name: Not on file   Number of children: Not on file   Years of education: Not on file   Highest education level: Not on file  Occupational History   Not on  file  Tobacco Use   Smoking status: Never   Smokeless tobacco: Never  Vaping Use   Vaping Use: Never used  Substance and Sexual Activity   Alcohol use: No   Drug use: No   Sexual activity: Not Currently  Other Topics Concern   Not on file  Social History Narrative   Not on file   Social Determinants of Health   Financial Resource Strain: Not on file  Food Insecurity: Not on file  Transportation Needs: Not on file  Physical Activity: Not on file  Stress: Not on file  Social Connections: Not on file  Intimate Partner Violence: Not on file     Review of Systems: General: negative for chills, fever, night sweats or weight changes.  Cardiovascular: negative for chest pain, dyspnea on exertion, edema, orthopnea, palpitations, paroxysmal  nocturnal dyspnea or shortness of breath Dermatological: negative for rash Respiratory: negative for cough or wheezing Urologic: negative for hematuria Abdominal: negative for nausea, vomiting, diarrhea, bright red blood per rectum, melena, or hematemesis Neurologic: negative for visual changes, syncope, or dizziness All other systems reviewed and are otherwise negative except as noted above.    Blood pressure (!) 142/90, pulse 65, height 5\' 2"  (1.575 m), weight 214 lb (97.1 kg), SpO2 95 %.  General appearance: alert and no distress Neck: no adenopathy, no carotid bruit, no JVD, supple, symmetrical, trachea midline, and thyroid not enlarged, symmetric, no tenderness/mass/nodules Lungs: clear to auscultation bilaterally Heart: regular rate and rhythm, S1, S2 normal, no murmur, click, rub or gallop Extremities: extremities normal, atraumatic, no cyanosis or edema Pulses: 2+ and symmetric Skin: Skin color, texture, turgor normal. No rashes or lesions Neurologic: Grossly normal  EKG sinus rhythm at 65 with nonspecific ST and T wave changes.  I personally reviewed this EKG.  ASSESSMENT AND PLAN:   Essential hypertension History of essential hypertension with blood pressure measured today at 142/90.  She is on hydrochlorothiazide, and losartan and metoprolol.  Hyperlipidemia History of hyperlipidemia on statin therapy with lipid profile performed 06/09/2021 revealing total cholesterol 157, LDL of 93.  History of chest pain History of atypical chest pain with normal cath in 06 and negative Myoview July 2020.  She no longer has chest pain.     Lorretta Harp MD FACP,FACC,FAHA, [facility] 06/30/2021 10:38 AM

## 2021-06-30 NOTE — Assessment & Plan Note (Signed)
History of atypical chest pain with normal cath in 06 and negative Myoview July 2020.  She no longer has chest pain.

## 2021-06-30 NOTE — Patient Instructions (Signed)
Medication Instructions:  Your physician recommends that you continue on your current medications as directed. Please refer to the Current Medication list given to you today.  *If you need a refill on your cardiac medications before your next appointment, please call your pharmacy*  Follow-Up: At [facility], you and your health needs are our priority.  As part of our continuing mission to provide you with exceptional heart care, we have created designated Provider Care Teams.  These Care Teams include your primary Cardiologist (physician) and Advanced Practice Providers (APPs -  Physician Assistants and Nurse Practitioners) who all work together to provide you with the care you need, when you need it.  We recommend signing up for the patient portal called "MyChart".  Sign up information is provided on this After Visit Summary.  MyChart is used to connect with patients for Virtual Visits (Telemedicine).  Patients are able to view lab/test results, encounter notes, upcoming appointments, etc.  Non-urgent messages can be sent to your provider as well.   To learn more about what you can do with MyChart, go to [email or web address].    Your next appointment:   12 month(s)  The format for your next appointment:   In Person  Provider:   Quay Burow, MD   Other Instructions Cleared at low cardiac risk for surgery.

## 2021-06-30 NOTE — Progress Notes (Addendum)
Anesthesia Review:  PCP: DR Sharilyn Sites  Clearance 05/07/21 on chart  Cardiologist : DR Lorie Phenix- [place] 06/30/21. - clearance in note  Chest x-ray : 2v- 09/12/20  EKG : 06/30/21  Echo : 2019  Stress test: 2020  Cardiac Cath :  Activity level: cannot do a flight of stairs without difficulty  Sleep Study/ CPAP : none  Fasting Blood Sugar :      / Checks Blood Sugar -- times a day:   Blood Thinner/ Instructions /Last Dose: ASA / Instructions/ Last Dose :  325 MG aSPIRIN  Covid test on 07/09/21.

## 2021-06-30 NOTE — Assessment & Plan Note (Signed)
History of essential hypertension with blood pressure measured today at 142/90.  She is on hydrochlorothiazide, and losartan and metoprolol.

## 2021-06-30 NOTE — Progress Notes (Signed)
DUE TO COVID-19 ONLY ONE VISITOR IS ALLOWED TO COME WITH YOU AND STAY IN THE WAITING ROOM ONLY DURING PRE OP AND PROCEDURE DAY OF SURGERY.  2 VISITOR  MAY VISIT WITH YOU AFTER SURGERY IN YOUR PRIVATE ROOM DURING VISITING HOURS ONLY!  YOU NEED TO HAVE A COVID 19 TEST ON___11/11/2020 @_  @_from  8am-3pm _____, THIS TEST MUST BE DONE BEFORE SURGERY,  Covid test is done at [place], [place] [address].  This is a drive thru.  No appt required. Please see map.                 Your procedure is scheduled on:    07/13/2021  Report to [facility] Main  Entrance   Report to admitting at     [phone]     Call this number if you have problems the morning of surgery [phone]    REMEMBER: NO  SOLID FOOD CANDY OR GUM AFTER MIDNIGHT. CLEAR LIQUIDS UNTIL     0630AM       . NOTHING BY MOUTH EXCEPT CLEAR LIQUIDS UNTIL   0630AM    . PLEASE FINISH ENSURE DRINK PER SURGEON ORDER  WHICH NEEDS TO BE COMPLETED AT     [id number]  .      CLEAR LIQUID DIET   Foods Allowed                                                                    Coffee and tea, regular and decaf                            Fruit ices (not with fruit pulp)                                      Iced Popsicles                                    Carbonated beverages, regular and diet                                    Cranberry, grape and apple juices Sports drinks like Gatorade Lightly seasoned clear broth or consume(fat free) Sugar, honey syrup ___________________________________________________________________      BRUSH YOUR TEETH MORNING OF SURGERY AND RINSE YOUR MOUTH OUT, NO CHEWING GUM CANDY OR MINTS.     Take these medicines the morning of surgery with A SIP OF WATER:  ARIMIDEX, SYNTHROID, METOPROLOL, DITROPAN, DETROL   DO NOT TAKE ANY DIABETIC MEDICATIONS DAY OF YOUR SURGERY                               You may not have any metal on your body including hair pins and              piercings  Do not  wear jewelry, make-up, lotions, powders or perfumes, deodorant  Do not wear nail polish on your fingernails.  Do not shave  48 hours prior to surgery.              Men may shave face and neck.   Do not bring valuables to the hospital. [place].  Contacts, dentures or bridgework may not be worn into surgery.  Leave suitcase in the car. After surgery it may be brought to your room.     Patients discharged the day of surgery will not be allowed to drive home. IF YOU ARE HAVING SURGERY AND GOING HOME THE SAME DAY, YOU MUST HAVE AN ADULT TO DRIVE YOU HOME AND BE WITH YOU FOR 24 HOURS. YOU MAY GO HOME BY TAXI OR UBER OR ORTHERWISE, BUT AN ADULT MUST ACCOMPANY YOU HOME AND STAY WITH YOU FOR 24 HOURS.  Name and phone number of your driver:  Special Instructions: N/A              Please read over the following fact sheets you were given: _____________________________________________________________________  [facility] - Preparing for Surgery Before surgery, you can play an important role.  Because skin is not sterile, your skin needs to be as free of germs as possible.  You can reduce the number of germs on your skin by washing with CHG (chlorahexidine gluconate) soap before surgery.  CHG is an antiseptic cleaner which kills germs and bonds with the skin to continue killing germs even after washing. Please DO NOT use if you have an allergy to CHG or antibacterial soaps.  If your skin becomes reddened/irritated stop using the CHG and inform your nurse when you arrive at Short Stay. Do not shave (including legs and underarms) for at least 48 hours prior to the first CHG shower.  You may shave your face/neck. Please follow these instructions carefully:  1.  Shower with CHG Soap the night before surgery and the  morning of Surgery.  2.  If you choose to wash your hair, wash your hair first as usual with your  normal  shampoo.  3.  After you  shampoo, rinse your hair and body thoroughly to remove the  shampoo.                           4.  Use CHG as you would any other liquid soap.  You can apply chg directly  to the skin and wash                       Gently with a scrungie or clean washcloth.  5.  Apply the CHG Soap to your body ONLY FROM THE NECK DOWN.   Do not use on face/ open                           Wound or open sores. Avoid contact with eyes, ears mouth and genitals (private parts).                       Wash face,  Genitals (private parts) with your normal soap.             6.  Wash thoroughly, paying special attention to the area where your surgery  will be performed.  7.  Thoroughly rinse your body with  warm water from the neck down.  8.  DO NOT shower/wash with your normal soap after using and rinsing off  the CHG Soap.                9.  Pat yourself dry with a clean towel.            10.  Wear clean pajamas.            11.  Place clean sheets on your bed the night of your first shower and do not  sleep with pets. Day of Surgery : Do not apply any lotions/deodorants the morning of surgery.  Please wear clean clothes to the hospital/surgery center.  FAILURE TO FOLLOW THESE INSTRUCTIONS MAY RESULT IN THE CANCELLATION OF YOUR SURGERY PATIENT SIGNATURE_________________________________  NURSE SIGNATURE__________________________________  ________________________________________________________________________

## 2021-06-30 NOTE — Assessment & Plan Note (Signed)
History of hyperlipidemia on statin therapy with lipid profile performed 06/09/2021 revealing total cholesterol 157, LDL of 93.

## 2021-07-02 ENCOUNTER — Encounter (HOSPITAL_COMMUNITY): Payer: Self-pay | Admitting: Orthopedic Surgery

## 2021-07-02 ENCOUNTER — Other Ambulatory Visit: Payer: Self-pay

## 2021-07-02 ENCOUNTER — Encounter (HOSPITAL_COMMUNITY)
Admission: RE | Admit: 2021-07-02 | Discharge: 2021-07-02 | Disposition: A | Discharge status: Home / Self Care | Payer: Medicare Other | Source: Ambulatory Visit | Attending: Orthopedic Surgery | Admitting: Orthopedic Surgery

## 2021-07-02 ENCOUNTER — Encounter (HOSPITAL_COMMUNITY): Payer: Self-pay

## 2021-07-02 VITALS — BP 178/87 | HR 64 | Temp 98.4°F | Resp 16 | Ht 62.0 in | Wt 211.0 lb

## 2021-07-02 DIAGNOSIS — E785 Hyperlipidemia, unspecified: Secondary | ICD-10-CM | POA: Insufficient documentation

## 2021-07-02 DIAGNOSIS — M1712 Unilateral primary osteoarthritis, left knee: Secondary | ICD-10-CM | POA: Insufficient documentation

## 2021-07-02 DIAGNOSIS — I10 Essential (primary) hypertension: Secondary | ICD-10-CM | POA: Diagnosis not present

## 2021-07-02 DIAGNOSIS — Z79899 Other long term (current) drug therapy: Secondary | ICD-10-CM | POA: Insufficient documentation

## 2021-07-02 DIAGNOSIS — E039 Hypothyroidism, unspecified: Secondary | ICD-10-CM | POA: Diagnosis not present

## 2021-07-02 DIAGNOSIS — K219 Gastro-esophageal reflux disease without esophagitis: Secondary | ICD-10-CM | POA: Insufficient documentation

## 2021-07-02 DIAGNOSIS — Z01812 Encounter for preprocedural laboratory examination: Secondary | ICD-10-CM | POA: Insufficient documentation

## 2021-07-02 DIAGNOSIS — Z01818 Encounter for other preprocedural examination: Secondary | ICD-10-CM

## 2021-07-02 LAB — TYPE AND SCREEN
ABO/RH(D): O NEG
Antibody Screen: NEGATIVE

## 2021-07-02 LAB — CBC
HCT: 41.3 % (ref 36.0–46.0)
Hemoglobin: 13.2 g/dL (ref 12.0–15.0)
MCH: 28.7 pg (ref 26.0–34.0)
MCHC: 32 g/dL (ref 30.0–36.0)
MCV: 89.8 fL (ref 80.0–100.0)
Platelets: 211 10*3/uL (ref 150–400)
RBC: 4.6 MIL/uL (ref 3.87–5.11)
RDW: 14 % (ref 11.5–15.5)
WBC: 6.5 10*3/uL (ref 4.0–10.5)
nRBC: 0 % (ref 0.0–0.2)

## 2021-07-02 LAB — SURGICAL PCR SCREEN
MRSA, PCR: NEGATIVE
Staphylococcus aureus: NEGATIVE

## 2021-07-02 LAB — COMPREHENSIVE METABOLIC PANEL
ALT: 14 U/L (ref 0–44)
AST: 19 U/L (ref 15–41)
Albumin: 3.7 g/dL (ref 3.5–5.0)
Alkaline Phosphatase: 77 U/L (ref 38–126)
Anion gap: 8 (ref 5–15)
BUN: 19 mg/dL (ref 8–23)
CO2: 23 mmol/L (ref 22–32)
Calcium: 9.5 mg/dL (ref 8.9–10.3)
Chloride: 107 mmol/L (ref 98–111)
Creatinine, Ser: 0.74 mg/dL (ref 0.44–1.00)
GFR, Estimated: >60 mL/min (ref >60)
Glucose, Bld: 86 mg/dL (ref 70–99)
Potassium: 4.1 mmol/L (ref 3.5–5.1)
Sodium: 138 mmol/L (ref 135–145)
Total Bilirubin: 0.5 mg/dL (ref 0.3–1.2)
Total Protein: 6.6 g/dL (ref 6.5–8.1)

## 2021-07-02 LAB — PROTIME-INR
INR: 1 (ref 0.8–1.2)
Prothrombin Time: 12.8 s (ref 11.4–15.2)

## 2021-07-03 NOTE — Progress Notes (Signed)
Anesthesia Chart Review   Case: [id number] Date/Time: 07/13/21 0915   Procedure: TOTAL KNEE ARTHROPLASTY (Left: Knee)   Anesthesia type: Choice   Pre-op diagnosis: left knee osteoarthritis   Location: [facility] ROOM 09 / [facility]   Surgeons: Gaynelle Arabian, MD       DISCUSSION:80 y.o. never smoker with h/o HTN, GERD, hypothyroidism, left knee OA scheduled for above procedure 07/13/2021 with Dr. Gaynelle Arabian.   Pt seen by cardiology 06/30/2021 for preoperative evaluation.  Per OV note, "Since I saw her a year ago she remains well.  She is scheduled for a left total knee replacement by Dr. Wynelle Link 07/13/2021 which she is cleared for at low risk.  She has had bilateral total hip replacements back in 06 and right total knee replacement in 2016."  Anticipate pt can proceed with planned procedure barring acute status change.   VS: BP (!) 178/87   Pulse 64   Temp 36.9 C (Oral)   Resp 16   Ht 5\' 2"  (1.575 m)   Wt 95.7 kg   SpO2 99%   BMI 38.59 kg/m   PROVIDERS: Sharilyn Sites, MD is PCP   Quay Burow, MD is Cardiologist  LABS: Labs reviewed: Acceptable for surgery. (all labs ordered are listed, but only abnormal results are displayed)  Labs Reviewed  SURGICAL PCR SCREEN  CBC  COMPREHENSIVE METABOLIC PANEL  PROTIME-INR  TYPE AND SCREEN     IMAGES:   EKG: 06/30/2021 Rate 65 bpm  NSR Nonspecific ST and T wave abnormality   CV: Stress Test 04/04/2019 The left ventricular ejection fraction is normal (55-65%). Nuclear stress EF: 55%. There was no ST segment deviation noted during stress. The study is normal. This is a low risk study.   Normal resting and stress perfusion. No ischemia or infarction EF 55%    Echo 03/03/2018 - Left ventricle: The cavity size was normal. Wall thickness was    normal. Systolic function was normal. The estimated ejection    fraction was in the range of 60% to 65%. Wall motion was normal;    there were no regional wall motion abnormalities.  Features are    consistent with a pseudonormal left ventricular filling pattern,    with concomitant abnormal relaxation and increased filling    pressure (grade 2 diastolic dysfunction). Past Medical History:  Diagnosis Date   Arthritis    Bladder spasms    "spastic bladder" wears pads   Cancer ([place])    Skin cancer -scalp and eyebrow"basal cell"   Complication of anesthesia    , woke up too early from surgery and pt had floppy fish syndrome   GERD (gastroesophageal reflux disease)    occ. frequent "hiccoughs" after "eating, indigestion"   Hyperlipemia    Hypertension    Hypothyroidism    Macular degeneration    Trigger finger, left    alternate fingers affected. -Occ. shooting pain.   Wears glasses     Past Surgical History:  Procedure Laterality Date   ABDOMINAL HYSTERECTOMY  09/07/1987   APPENDECTOMY     CARDIAC CATHETERIZATION  09/06/2004   normal-done for work up pre hip surgeries   CARPAL TUNNEL RELEASE Left    CHOLECYSTECTOMY  09/07/1995   JOINT REPLACEMENT     BTHA   left breast lumpectomy      POLYPECTOMY Bilateral 06/26/2013   Procedure: BILATERAL EXCISION OF NASAL POLYPS ;  Surgeon: Ascencion Dike, MD;  Location: [place] [facility];  Service: ENT;  Laterality: Bilateral;  SEPTOPLASTY N/A 06/26/2013   Procedure: AND SEPTOPLASTY;  Surgeon: Ascencion Dike, MD;  Location: [facility];  Service: ENT;  Laterality: N/A;   THYROIDECTOMY  09/06/1992   TONSILLECTOMY     TOTAL HIP ARTHROPLASTY  09/06/2004   right   TOTAL HIP ARTHROPLASTY  09/06/2004   left   TOTAL KNEE ARTHROPLASTY Right 12/13/2014   Procedure: RIGHT TOTAL KNEE ARTHROPLASTY;  Surgeon: Gaynelle Arabian, MD;  Location: [facility];  Service: Orthopedics;  Laterality: Right;    MEDICATIONS:  anastrozole (ARIMIDEX) 1 MG tablet   Calcium Citrate (CITRACAL PO)   Cholecalciferol (VITAMIN D) 50 MCG (2000 UT) CAPS   hydrochlorothiazide (MICROZIDE) 12.5 MG capsule   levothyroxine (SYNTHROID,  LEVOTHROID) 125 MCG tablet   losartan (COZAAR) 50 MG tablet   metoprolol tartrate (LOPRESSOR) 50 MG tablet   Multiple Vitamins-Minerals (PRESERVISION AREDS 2 PO)   Polyethyl Glycol-Propyl Glycol (SYSTANE) 0.4-0.3 % SOLN   simvastatin (ZOCOR) 10 MG tablet   tolterodine (DETROL LA) 4 MG 24 hr capsule   Vitamins A & D (VITAMIN A & D) ointment   No current facility-administered medications for this encounter.    Konrad Felix Ward, PA-C [facility] Pre-Surgical Testing [phone]

## 2021-07-03 NOTE — Anesthesia Preprocedure Evaluation (Addendum)
Anesthesia Evaluation  Patient identified by MRN, date of birth, ID band Patient awake    Reviewed: Allergy & Precautions, NPO status , Patient's Chart, lab work & pertinent test results  Airway Mallampati: II  TM Distance: >3 FB Neck ROM: Full    Dental no notable dental hx.    Pulmonary neg pulmonary ROS,    Pulmonary exam normal breath sounds clear to auscultation       Cardiovascular hypertension, Normal cardiovascular exam Rhythm:Regular Rate:Normal  EKG: NSR rate 65   Neuro/Psych negative neurological ROS  negative psych ROS   GI/Hepatic Neg liver ROS, GERD  ,  Endo/Other  Hypothyroidism   Renal/GU negative Renal ROS  negative genitourinary   Musculoskeletal  (+) Arthritis ,   Abdominal   Peds negative pediatric ROS (+)  Hematology negative hematology ROS (+)   Anesthesia Other Findings   Reproductive/Obstetrics negative OB ROS                            Anesthesia Physical Anesthesia Plan  ASA: 3  Anesthesia Plan: Spinal and MAC   Post-op Pain Management:    Induction: Intravenous  PONV Risk Score and Plan: 2 and Treatment may vary due to age or medical condition, Propofol infusion, TIVA and Ondansetron  Airway Management Planned: Natural Airway and Simple Face Mask  Additional Equipment:   Intra-op Plan:   Post-operative Plan:   Informed Consent: I have reviewed the patients History and Physical, chart, labs and discussed the procedure including the risks, benefits and alternatives for the proposed anesthesia with the patient or authorized representative who has indicated his/her understanding and acceptance.     Dental advisory given  Plan Discussed with: CRNA, Anesthesiologist and Surgeon  Anesthesia Plan Comments: (Adductor canal block. Spinal. GA/LMA as backup. Norton Blizzard, MD  )      Anesthesia Quick Evaluation

## 2021-07-06 DIAGNOSIS — Z85828 Personal history of other malignant neoplasm of skin: Secondary | ICD-10-CM | POA: Diagnosis not present

## 2021-07-06 DIAGNOSIS — Z853 Personal history of malignant neoplasm of breast: Secondary | ICD-10-CM | POA: Diagnosis not present

## 2021-07-06 DIAGNOSIS — M85832 Other specified disorders of bone density and structure, left forearm: Secondary | ICD-10-CM | POA: Diagnosis not present

## 2021-07-06 DIAGNOSIS — Z08 Encounter for follow-up examination after completed treatment for malignant neoplasm: Secondary | ICD-10-CM | POA: Diagnosis not present

## 2021-07-06 DIAGNOSIS — Z9889 Other specified postprocedural states: Secondary | ICD-10-CM | POA: Diagnosis not present

## 2021-07-06 DIAGNOSIS — C50912 Malignant neoplasm of unspecified site of left female breast: Secondary | ICD-10-CM | POA: Diagnosis not present

## 2021-07-06 DIAGNOSIS — Z79811 Long term (current) use of aromatase inhibitors: Secondary | ICD-10-CM | POA: Diagnosis not present

## 2021-07-06 DIAGNOSIS — C50512 Malignant neoplasm of lower-outer quadrant of left female breast: Secondary | ICD-10-CM | POA: Diagnosis not present

## 2021-07-06 DIAGNOSIS — Z170 Estrogen receptor positive status [ER+]: Secondary | ICD-10-CM | POA: Diagnosis not present

## 2021-07-09 ENCOUNTER — Other Ambulatory Visit: Payer: Self-pay | Admitting: Orthopedic Surgery

## 2021-07-09 LAB — SARS CORONAVIRUS 2 (TAT 6-24 HRS): SARS Coronavirus 2: NEGATIVE

## 2021-07-12 NOTE — H&P (Signed)
TOTAL KNEE ADMISSION H&P  Patient is being admitted for left total knee arthroplasty.  Subjective:  Chief Complaint: Left knee pain.  HPI: [name], 80 y.o. female has a history of pain and functional disability in the left knee due to arthritis and has failed non-surgical conservative treatments for greater than 12 weeks to include NSAID's and/or analgesics, flexibility and strengthening excercises, and activity modification. Onset of symptoms was gradual, starting  several  years ago with gradually worsening course since that time. The patient noted no past surgery on the left knee.  Patient currently rates pain in the left knee at 7 out of 10 with activity. Patient has worsening of pain with activity and weight bearing, pain that interferes with activities of daily living, pain with passive range of motion, and crepitus. Patient has evidence of periarticular osteophytes and joint space narrowing by imaging studies. There is no active infection.  Patient Active Problem List   Diagnosis Date Noted   History of chest pain 03/20/2019   History of breast cancer 08/14/2018   Obesity (BMI 30-39.9) 08/14/2018   Bilateral lower extremity edema 02/14/2018   Essential hypertension 02/14/2018   Hyperlipidemia 02/14/2018   Family history of heart disease 02/14/2018   Onychomycosis 08/26/2016   OA (osteoarthritis) of knee 12/13/2014    Past Medical History:  Diagnosis Date   Arthritis    Bladder spasms    "spastic bladder" wears pads   Cancer ([place])    Skin cancer -scalp and eyebrow"basal cell"   Complication of anesthesia    , woke up too early from surgery and pt had floppy fish syndrome   GERD (gastroesophageal reflux disease)    occ. frequent "hiccoughs" after "eating, indigestion"   Hyperlipemia    Hypertension    Hypothyroidism    Macular degeneration    Trigger finger, left    alternate fingers affected. -Occ. shooting pain.   Wears glasses     Past Surgical History:   Procedure Laterality Date   ABDOMINAL HYSTERECTOMY  09/07/1987   APPENDECTOMY     CARDIAC CATHETERIZATION  09/06/2004   normal-done for work up pre hip surgeries   CARPAL TUNNEL RELEASE Left    CHOLECYSTECTOMY  09/07/1995   JOINT REPLACEMENT     BTHA   left breast lumpectomy      POLYPECTOMY Bilateral 06/26/2013   Procedure: BILATERAL EXCISION OF NASAL POLYPS ;  Surgeon: Ascencion Dike, MD;  Location: [place];  Service: ENT;  Laterality: Bilateral;   SEPTOPLASTY N/A 06/26/2013   Procedure: AND SEPTOPLASTY;  Surgeon: Ascencion Dike, MD;  Location: [place];  Service: ENT;  Laterality: N/A;   THYROIDECTOMY  09/06/1992   TONSILLECTOMY     TOTAL HIP ARTHROPLASTY  09/06/2004   right   TOTAL HIP ARTHROPLASTY  09/06/2004   left   TOTAL KNEE ARTHROPLASTY Right 12/13/2014   Procedure: RIGHT TOTAL KNEE ARTHROPLASTY;  Surgeon: Gaynelle Arabian, MD;  Location: [facility];  Service: Orthopedics;  Laterality: Right;    Prior to Admission medications   Medication Sig Start Date End Date Taking? Authorizing Provider  anastrozole (ARIMIDEX) 1 MG tablet Take 1 mg by mouth daily.   Yes [provider]  Calcium Citrate (CITRACAL PO) Take 2 tablets by mouth 2 (two) times daily. 500 mg   Yes [provider]  Cholecalciferol (VITAMIN D) 50 MCG (2000 UT) CAPS Take 1 capsule by mouth 2 (two) times daily.   Yes [provider]  hydrochlorothiazide (MICROZIDE)  12.5 MG capsule TAKE 1 CAPSULE BY MOUTH EVERY DAY 06/15/21  Yes Lorretta Harp, MD  levothyroxine (SYNTHROID, LEVOTHROID) 125 MCG tablet Take 125 mcg by mouth daily before breakfast.   Yes [provider]  losartan (COZAAR) 50 MG tablet Take 1 tablet (50 mg total) by mouth daily. 08/26/20 06/30/21 Yes Lorretta Harp, MD  metoprolol tartrate (LOPRESSOR) 50 MG tablet Take 50 mg by mouth 2 (two) times daily.   Yes [provider]  Multiple Vitamins-Minerals (PRESERVISION AREDS 2 PO)  Take 1 tablet by mouth 2 (two) times daily.   Yes [provider]  Polyethyl Glycol-Propyl Glycol (SYSTANE) 0.4-0.3 % SOLN Place 1 application into both eyes daily as needed (Dry eye). Patient not taking: Reported on 06/30/2021   Yes [provider]  simvastatin (ZOCOR) 10 MG tablet Take 10 mg by mouth at bedtime.   Yes [provider]  tolterodine (DETROL LA) 4 MG 24 hr capsule TAKE ONE CAPSULE BY MOUTH DAILY 06/18/21 09/16/21 Yes Dahlstedt, Annie Main, MD  Vitamins A & D (VITAMIN A & D) ointment Apply 1 application topically daily as needed ([place] (Skin Chafing)).   Yes [provider]    Allergies  Allergen Reactions   Feldene [Piroxicam] Other (See Comments)    CANT REMEMBER   Gabapentin Other (See Comments)    Gi upset   Ivp Dye [Iodinated Diagnostic Agents] Other (See Comments)    CANT REMEMBER   Latex Other (See Comments)    Tickle nose   Sulfa Antibiotics     CANT REMEMBER   Naproxen Nausea And Vomiting   Vicodin [Hydrocodone-Acetaminophen] Nausea And Vomiting    Social History   Socioeconomic History   Marital status: Single    Spouse name: Not on file   Number of children: Not on file   Years of education: Not on file   Highest education level: Not on file  Occupational History   Not on file  Tobacco Use   Smoking status: Never   Smokeless tobacco: Never  Vaping Use   Vaping Use: Never used  Substance and Sexual Activity   Alcohol use: Never   Drug use: No   Sexual activity: Not Currently  Other Topics Concern   Not on file  Social History Narrative   Not on file   Social Determinants of Health   Financial Resource Strain: Not on file  Food Insecurity: Not on file  Transportation Needs: Not on file  Physical Activity: Not on file  Stress: Not on file  Social Connections: Not on file  Intimate Partner Violence: Not on file    Tobacco Use: Low Risk    Smoking Tobacco Use: Never   Smokeless Tobacco Use: Never    Passive Exposure: Not on file   Social History   Substance and Sexual Activity  Alcohol Use Never    No family history on file.  ROS: Constitutional: no fever, no chills, no night sweats, no significant weight loss Cardiovascular: no chest pain, no palpitations Respiratory: no cough, no shortness of breath, No COPD Gastrointestinal: no vomiting, no nausea Musculoskeletal: no swelling in Joints, Joint Pain Neurologic: no numbness, no tingling, no difficulty with balance   Objective:  Physical Exam: Well nourished and well developed.  General: Alert and oriented x3, cooperative and pleasant, no acute distress.  Head: normocephalic, atraumatic, neck supple.  Eyes: EOMI.  Respiratory: breath sounds clear in all fields, no wheezing, rales, or rhonchi. Cardiovascular: Regular rate and rhythm, no murmurs, gallops or  rubs.  Abdomen: non-tender to palpation and soft, normoactive bowel sounds. Musculoskeletal:  Bilateral Hip Exam:   The range of motion: Flexion to 110 degrees, Internal Rotation to 20 degrees, External Rotation to 30 degrees, and abduction to 30 degrees without discomfort.   There is no tenderness over the greater trochanteric bursa.     Right Knee Exam:   No swelling present. No swelling present.   The range of motion is: 0 to 125 degrees.   No crepitus on range of motion of the knee.   No medial joint line tenderness.   No lateral joint line tenderness.   The knee is stable.     Left Knee Exam:   Significant valgus deformity.   No effusion present. No swelling present.   The range of motion is: 5 to 100 degrees.   Marked crepitus on range of motion of the knee.   Positive lateral greater than medial joint line tenderness.   The knee is stable.  Calves soft and nontender. Motor function intact in LE. Strength 5/5 LE bilaterally. Neuro: Distal pulses 2+. Sensation to light touch intact in LE.  Radiographs- AP pelvis, AP and lateral of the bilateral hips  dated 04/16/2021 demonstrate both prostheses in excellent position with no periprosthetic abnormalities. There is no wear or osteolysis.     Vital signs in last 24 hours:    Imaging Review Radiographs- AP and lateral of the bilateral knees dated 04/16/2021 demonstrate the prosthesis on the right is in excellent position with no periprosthetic abnormalities. On the left, she is bone-on-bone in the lateral and patellofemoral compartments with a valgus deformity.  Assessment/Plan:  End stage arthritis, left knee   The patient history, physical examination, clinical judgment of the provider and imaging studies are consistent with end stage degenerative joint disease of the left knee and total knee arthroplasty is deemed medically necessary. The treatment options including medical management, injection therapy arthroscopy and arthroplasty were discussed at length. The risks and benefits of total knee arthroplasty were presented and reviewed. The risks due to aseptic loosening, infection, stiffness, patella tracking problems, thromboembolic complications and other imponderables were discussed. The patient acknowledged the explanation, agreed to proceed with the plan and consent was signed. Patient is being admitted for inpatient treatment for surgery, pain control, PT, OT, prophylactic antibiotics, VTE prophylaxis, progressive ambulation and ADLs and discharge planning. The patient is planning to be discharged  home .   Patient's anticipated LOS is less than 2 midnights, meeting these requirements: - Lives within 1 hour of care - Has a competent adult at home to recover with post-op - NO history of  - Chronic pain requiring opioids  - Diabetes  - Coronary Artery Disease  - Heart failure  - Heart attack  - Stroke  - DVT/VTE  - Cardiac arrhythmia  - Respiratory Failure/COPD  - Renal failure  - Anemia  - Advanced Liver disease  Therapy Plans: [facility] Disposition: Home with  cousin  Planned DVT Prophylaxis: Xarelto 10mg  (Hx of skin cancer and breast cancer) DME Needed: None PCP: Sharilyn Sites, Md (clearance received) Cardiologist: Denver Faster, MD (patient seeing for clearance today) TXA: IV Allergies: Feldene, Iodinated Contrast Media, NSAIDs, Vicodin, Latex, Sulfa Anesthesia Concerns: Slow to arise following surgery at [facility] BMI: 39.6 Last HgbA1c: N/A  Ph[facility] [address] [facility] [place]     - Patient was instructed on what medications to stop prior to surgery. - Follow-up visit in 2 weeks with Dr. Wynelle Link -  Begin physical therapy following surgery - Pre-operative lab work as pre-surgical testing - Prescriptions will be provided in hospital at time of discharge  Fenton Foy, [place], PA-C Orthopedic Surgery [facility] [place]

## 2021-07-13 ENCOUNTER — Ambulatory Visit (HOSPITAL_COMMUNITY): Payer: Medicare Other | Admitting: Anesthesiology

## 2021-07-13 ENCOUNTER — Encounter (HOSPITAL_COMMUNITY): Payer: Self-pay | Admitting: Orthopedic Surgery

## 2021-07-13 ENCOUNTER — Observation Stay (HOSPITAL_COMMUNITY)
Admission: RE | Admit: 2021-07-13 | Discharge: 2021-07-15 | Disposition: A | Discharge status: Home / Self Care | Payer: Medicare Other | Source: Ambulatory Visit | Attending: Orthopedic Surgery | Admitting: Orthopedic Surgery

## 2021-07-13 ENCOUNTER — Encounter (HOSPITAL_COMMUNITY)
Admission: RE | Disposition: A | Discharge status: Home / Self Care | Payer: Self-pay | Source: Ambulatory Visit | Attending: Orthopedic Surgery

## 2021-07-13 ENCOUNTER — Other Ambulatory Visit: Payer: Self-pay

## 2021-07-13 ENCOUNTER — Ambulatory Visit (HOSPITAL_COMMUNITY): Payer: Medicare Other | Admitting: Physician Assistant

## 2021-07-13 DIAGNOSIS — E039 Hypothyroidism, unspecified: Secondary | ICD-10-CM | POA: Insufficient documentation

## 2021-07-13 DIAGNOSIS — Z79899 Other long term (current) drug therapy: Secondary | ICD-10-CM | POA: Insufficient documentation

## 2021-07-13 DIAGNOSIS — I10 Essential (primary) hypertension: Secondary | ICD-10-CM | POA: Insufficient documentation

## 2021-07-13 DIAGNOSIS — G8918 Other acute postprocedural pain: Secondary | ICD-10-CM | POA: Diagnosis not present

## 2021-07-13 DIAGNOSIS — M1712 Unilateral primary osteoarthritis, left knee: Secondary | ICD-10-CM | POA: Diagnosis not present

## 2021-07-13 HISTORY — DX: Unspecified macular degeneration: H35.30

## 2021-07-13 HISTORY — PX: TOTAL KNEE ARTHROPLASTY: SHX125

## 2021-07-13 SURGERY — ARTHROPLASTY, KNEE, TOTAL
Anesthesia: Monitor Anesthesia Care | Site: Knee | Laterality: Left

## 2021-07-13 MED ORDER — STERILE WATER FOR IRRIGATION IR SOLN
Status: DC | PRN
  Administered 2021-07-13: 2000 mL

## 2021-07-13 MED ORDER — DEXAMETHASONE SODIUM PHOSPHATE 10 MG/ML IJ SOLN
10.0000 mg | Freq: Once | INTRAMUSCULAR | Status: AC
  Administered 2021-07-14: 10 mg via INTRAVENOUS
  Filled 2021-07-13: qty 1

## 2021-07-13 MED ORDER — DOCUSATE SODIUM 100 MG PO CAPS
100.0000 mg | ORAL_CAPSULE | Freq: Two times a day (BID) | ORAL | Status: DC
  Administered 2021-07-13 – 2021-07-15 (×4): 100 mg via ORAL
  Filled 2021-07-13 (×4): qty 1

## 2021-07-13 MED ORDER — HYDROCHLOROTHIAZIDE 12.5 MG PO TABS
12.5000 mg | ORAL_TABLET | Freq: Every day | ORAL | Status: DC
  Administered 2021-07-15: 12.5 mg via ORAL
  Filled 2021-07-13 (×4): qty 1

## 2021-07-13 MED ORDER — LOSARTAN POTASSIUM 50 MG PO TABS
50.0000 mg | ORAL_TABLET | Freq: Every day | ORAL | Status: DC
  Administered 2021-07-14 – 2021-07-15 (×2): 50 mg via ORAL
  Filled 2021-07-13 (×2): qty 1

## 2021-07-13 MED ORDER — CEFAZOLIN SODIUM-DEXTROSE 2-4 GM/100ML-% IV SOLN
2.0000 g | Freq: Four times a day (QID) | INTRAVENOUS | Status: AC
  Administered 2021-07-13 (×2): 2 g via INTRAVENOUS
  Filled 2021-07-13 (×2): qty 100

## 2021-07-13 MED ORDER — PHENYLEPHRINE HCL-NACL 20-0.9 MG/250ML-% IV SOLN
INTRAVENOUS | Status: DC | PRN
  Administered 2021-07-13: 25 ug/min via INTRAVENOUS
  Administered 2021-07-13: 35 ug/min via INTRAVENOUS

## 2021-07-13 MED ORDER — MORPHINE SULFATE (PF) 2 MG/ML IV SOLN
0.5000 mg | INTRAVENOUS | Status: DC | PRN
  Administered 2021-07-13 (×2): 1 mg via INTRAVENOUS
  Filled 2021-07-13 (×2): qty 1

## 2021-07-13 MED ORDER — BISACODYL 10 MG RE SUPP: 10.0000 mg | Freq: Every day | RECTAL | Status: DC | PRN

## 2021-07-13 MED ORDER — ONDANSETRON HCL 4 MG/2ML IJ SOLN
INTRAMUSCULAR | Status: DC | PRN
  Administered 2021-07-13: 4 mg via INTRAVENOUS

## 2021-07-13 MED ORDER — TRANEXAMIC ACID-NACL 1000-0.7 MG/100ML-% IV SOLN
1000.0000 mg | INTRAVENOUS | Status: AC
  Administered 2021-07-13: 1000 mg via INTRAVENOUS
  Filled 2021-07-13: qty 100

## 2021-07-13 MED ORDER — ANASTROZOLE 1 MG PO TABS
1.0000 mg | ORAL_TABLET | Freq: Every day | ORAL | Status: DC
  Administered 2021-07-14 – 2021-07-15 (×2): 1 mg via ORAL
  Filled 2021-07-13 (×2): qty 1

## 2021-07-13 MED ORDER — DIPHENHYDRAMINE HCL 12.5 MG/5ML PO ELIX
12.5000 mg | ORAL_SOLUTION | ORAL | Status: DC | PRN
  Administered 2021-07-13: 25 mg via ORAL
  Filled 2021-07-13: qty 10

## 2021-07-13 MED ORDER — SODIUM CHLORIDE (PF) 0.9 % IJ SOLN
INTRAMUSCULAR | Status: AC
  Filled 2021-07-13: qty 10

## 2021-07-13 MED ORDER — AMISULPRIDE (ANTIEMETIC) 5 MG/2ML IV SOLN: 10.0000 mg | Freq: Once | INTRAVENOUS | Status: DC | PRN

## 2021-07-13 MED ORDER — METOCLOPRAMIDE HCL 5 MG/ML IJ SOLN: 5.0000 mg | Freq: Three times a day (TID) | INTRAMUSCULAR | Status: DC | PRN

## 2021-07-13 MED ORDER — SODIUM CHLORIDE 0.9 % IR SOLN
Status: DC | PRN
  Administered 2021-07-13: 1000 mL

## 2021-07-13 MED ORDER — SODIUM CHLORIDE 0.9 % IV SOLN: INTRAVENOUS | Status: DC

## 2021-07-13 MED ORDER — DEXAMETHASONE SODIUM PHOSPHATE 10 MG/ML IJ SOLN
INTRAMUSCULAR | Status: AC
  Filled 2021-07-13: qty 1

## 2021-07-13 MED ORDER — METHOCARBAMOL 500 MG IVPB - SIMPLE MED
500.0000 mg | Freq: Four times a day (QID) | INTRAVENOUS | Status: DC | PRN
  Administered 2021-07-13: 500 mg via INTRAVENOUS
  Filled 2021-07-13: qty 50

## 2021-07-13 MED ORDER — ONDANSETRON HCL 4 MG/2ML IJ SOLN: 4.0000 mg | Freq: Four times a day (QID) | INTRAMUSCULAR | Status: DC | PRN

## 2021-07-13 MED ORDER — METOPROLOL TARTRATE 50 MG PO TABS
50.0000 mg | ORAL_TABLET | Freq: Two times a day (BID) | ORAL | Status: DC
  Administered 2021-07-13 – 2021-07-15 (×4): 50 mg via ORAL
  Filled 2021-07-13 (×4): qty 1

## 2021-07-13 MED ORDER — SODIUM CHLORIDE (PF) 0.9 % IJ SOLN
INTRAMUSCULAR | Status: DC | PRN
  Administered 2021-07-13: 60 mL

## 2021-07-13 MED ORDER — POVIDONE-IODINE 10 % EX SWAB
2.0000 "application " | Freq: Once | CUTANEOUS | Status: AC
  Administered 2021-07-13: 2 via TOPICAL

## 2021-07-13 MED ORDER — PROPOFOL 10 MG/ML IV BOLUS
INTRAVENOUS | Status: AC
  Filled 2021-07-13: qty 20

## 2021-07-13 MED ORDER — ONDANSETRON HCL 4 MG PO TABS: 4.0000 mg | ORAL_TABLET | Freq: Four times a day (QID) | ORAL | Status: DC | PRN

## 2021-07-13 MED ORDER — METHOCARBAMOL 500 MG IVPB - SIMPLE MED
INTRAVENOUS | Status: AC
  Filled 2021-07-13: qty 50

## 2021-07-13 MED ORDER — RIVAROXABAN 10 MG PO TABS
10.0000 mg | ORAL_TABLET | Freq: Every day | ORAL | Status: DC
  Administered 2021-07-14 – 2021-07-15 (×2): 10 mg via ORAL
  Filled 2021-07-13 (×2): qty 1

## 2021-07-13 MED ORDER — PROPOFOL 1000 MG/100ML IV EMUL
INTRAVENOUS | Status: AC
  Filled 2021-07-13: qty 100

## 2021-07-13 MED ORDER — METOCLOPRAMIDE HCL 5 MG PO TABS: 5.0000 mg | ORAL_TABLET | Freq: Three times a day (TID) | ORAL | Status: DC | PRN

## 2021-07-13 MED ORDER — TRAMADOL HCL 50 MG PO TABS
50.0000 mg | ORAL_TABLET | Freq: Four times a day (QID) | ORAL | Status: DC | PRN
  Administered 2021-07-13 – 2021-07-15 (×4): 100 mg via ORAL
  Administered 2021-07-15: 50 mg via ORAL
  Filled 2021-07-13 (×2): qty 2
  Filled 2021-07-13: qty 1
  Filled 2021-07-13 (×2): qty 2

## 2021-07-13 MED ORDER — MIDAZOLAM HCL 2 MG/2ML IJ SOLN
INTRAMUSCULAR | Status: AC
  Filled 2021-07-13: qty 2

## 2021-07-13 MED ORDER — BUPIVACAINE LIPOSOME 1.3 % IJ SUSP
INTRAMUSCULAR | Status: AC
  Filled 2021-07-13: qty 20

## 2021-07-13 MED ORDER — FENTANYL CITRATE PF 50 MCG/ML IJ SOSY
50.0000 ug | PREFILLED_SYRINGE | INTRAMUSCULAR | Status: DC
  Administered 2021-07-13: 50 ug via INTRAVENOUS
  Filled 2021-07-13: qty 2

## 2021-07-13 MED ORDER — PROPOFOL 500 MG/50ML IV EMUL
INTRAVENOUS | Status: DC | PRN
  Administered 2021-07-13: 75 ug/kg/min via INTRAVENOUS

## 2021-07-13 MED ORDER — FLEET ENEMA 7-19 GM/118ML RE ENEM: 1.0000 | ENEMA | Freq: Once | RECTAL | Status: DC | PRN

## 2021-07-13 MED ORDER — LEVOTHYROXINE SODIUM 125 MCG PO TABS
125.0000 ug | ORAL_TABLET | Freq: Every day | ORAL | Status: DC
  Administered 2021-07-14 – 2021-07-15 (×2): 125 ug via ORAL
  Filled 2021-07-13 (×2): qty 1

## 2021-07-13 MED ORDER — BUPIVACAINE HCL (PF) 0.5 % IJ SOLN
INTRAMUSCULAR | Status: DC | PRN
  Administered 2021-07-13: 30 mL

## 2021-07-13 MED ORDER — ACETAMINOPHEN 500 MG PO TABS
1000.0000 mg | ORAL_TABLET | Freq: Four times a day (QID) | ORAL | Status: AC
  Administered 2021-07-13 – 2021-07-14 (×4): 1000 mg via ORAL
  Filled 2021-07-13 (×4): qty 2

## 2021-07-13 MED ORDER — ACETAMINOPHEN 500 MG PO TABS: 1000.0000 mg | ORAL_TABLET | Freq: Once | ORAL | Status: DC

## 2021-07-13 MED ORDER — MENTHOL 3 MG MT LOZG: 1.0000 | LOZENGE | OROMUCOSAL | Status: DC | PRN

## 2021-07-13 MED ORDER — LACTATED RINGERS IV SOLN: INTRAVENOUS | Status: DC

## 2021-07-13 MED ORDER — METHOCARBAMOL 500 MG PO TABS
500.0000 mg | ORAL_TABLET | Freq: Four times a day (QID) | ORAL | Status: DC | PRN
  Administered 2021-07-14 (×2): 500 mg via ORAL
  Filled 2021-07-13 (×2): qty 1

## 2021-07-13 MED ORDER — FENTANYL CITRATE (PF) 100 MCG/2ML IJ SOLN
INTRAMUSCULAR | Status: DC | PRN
  Administered 2021-07-13: 50 ug via INTRAVENOUS

## 2021-07-13 MED ORDER — BUPIVACAINE LIPOSOME 1.3 % IJ SUSP
INTRAMUSCULAR | Status: DC | PRN
  Administered 2021-07-13: 20 mL

## 2021-07-13 MED ORDER — ONDANSETRON HCL 4 MG/2ML IJ SOLN
INTRAMUSCULAR | Status: AC
  Filled 2021-07-13: qty 2

## 2021-07-13 MED ORDER — PHENOL 1.4 % MT LIQD: 1.0000 | OROMUCOSAL | Status: DC | PRN

## 2021-07-13 MED ORDER — FESOTERODINE FUMARATE ER 8 MG PO TB24
8.0000 mg | ORAL_TABLET | Freq: Every day | ORAL | Status: DC
  Administered 2021-07-13 – 2021-07-15 (×3): 8 mg via ORAL
  Filled 2021-07-13 (×3): qty 1

## 2021-07-13 MED ORDER — POLYETHYLENE GLYCOL 3350 17 G PO PACK: 17.0000 g | PACK | Freq: Every day | ORAL | Status: DC | PRN

## 2021-07-13 MED ORDER — ACETAMINOPHEN 10 MG/ML IV SOLN
INTRAVENOUS | Status: AC
  Filled 2021-07-13: qty 100

## 2021-07-13 MED ORDER — SIMVASTATIN 20 MG PO TABS
10.0000 mg | ORAL_TABLET | Freq: Every day | ORAL | Status: DC
  Administered 2021-07-14: 10 mg via ORAL
  Filled 2021-07-13: qty 1

## 2021-07-13 MED ORDER — OXYCODONE HCL 5 MG PO TABS
ORAL_TABLET | ORAL | Status: AC
  Filled 2021-07-13: qty 1

## 2021-07-13 MED ORDER — FENTANYL CITRATE PF 50 MCG/ML IJ SOSY: 25.0000 ug | PREFILLED_SYRINGE | INTRAMUSCULAR | Status: DC | PRN

## 2021-07-13 MED ORDER — BUPIVACAINE LIPOSOME 1.3 % IJ SUSP: 20.0000 mL | Freq: Once | INTRAMUSCULAR | Status: DC

## 2021-07-13 MED ORDER — ONDANSETRON HCL 4 MG/2ML IJ SOLN: 4.0000 mg | Freq: Once | INTRAMUSCULAR | Status: DC | PRN

## 2021-07-13 MED ORDER — 0.9 % SODIUM CHLORIDE (POUR BTL) OPTIME
TOPICAL | Status: DC | PRN
  Administered 2021-07-13: 1000 mL

## 2021-07-13 MED ORDER — CEFAZOLIN SODIUM-DEXTROSE 2-4 GM/100ML-% IV SOLN
2.0000 g | INTRAVENOUS | Status: AC
  Administered 2021-07-13: 2 g via INTRAVENOUS
  Filled 2021-07-13: qty 100

## 2021-07-13 MED ORDER — OXYCODONE HCL 5 MG PO TABS
5.0000 mg | ORAL_TABLET | ORAL | Status: DC | PRN
  Administered 2021-07-13: 5 mg via ORAL
  Administered 2021-07-13: 10 mg via ORAL
  Administered 2021-07-13: 5 mg via ORAL
  Administered 2021-07-14 (×3): 10 mg via ORAL
  Filled 2021-07-13 (×2): qty 2
  Filled 2021-07-13: qty 1
  Filled 2021-07-13 (×3): qty 2

## 2021-07-13 MED ORDER — PHENYLEPHRINE HCL (PRESSORS) 10 MG/ML IV SOLN
INTRAVENOUS | Status: AC
  Filled 2021-07-13: qty 2

## 2021-07-13 MED ORDER — DEXAMETHASONE SODIUM PHOSPHATE 10 MG/ML IJ SOLN
8.0000 mg | Freq: Once | INTRAMUSCULAR | Status: AC
  Administered 2021-07-13: 8 mg via INTRAVENOUS

## 2021-07-13 MED ORDER — FENTANYL CITRATE (PF) 100 MCG/2ML IJ SOLN
INTRAMUSCULAR | Status: AC
  Filled 2021-07-13: qty 2

## 2021-07-13 MED ORDER — PROPOFOL 10 MG/ML IV BOLUS
INTRAVENOUS | Status: DC | PRN
  Administered 2021-07-13 (×2): 20 mg via INTRAVENOUS

## 2021-07-13 SURGICAL SUPPLY — 57 items
BAG COUNTER SPONGE SURGICOUNT (BAG) IMPLANT
BAG SPEC THK2 15X12 ZIP CLS (MISCELLANEOUS) ×1
BAG SPNG CNTER NS LX DISP (BAG)
BAG SURGICOUNT SPONGE COUNTING (BAG)
BAG ZIPLOCK 12X15 (MISCELLANEOUS) ×3 IMPLANT
BLADE SAG 18X100X1.27 (BLADE) ×3 IMPLANT
BLADE SAW SGTL 11.0X1.19X90.0M (BLADE) ×3 IMPLANT
BNDG ELASTIC 6X5.8 VLCR STR LF (GAUZE/BANDAGES/DRESSINGS) ×3 IMPLANT
BOWL SMART MIX CTS (DISPOSABLE) ×3 IMPLANT
CATH SILICONE 14FRX5CC (CATHETERS) ×2 IMPLANT
CEMENT BONE SIMPLEX SPEEDSET (Cement) ×4 IMPLANT
CEMENT TIBIA MBT (Knees) IMPLANT
CLOSURE STERI-STRIP 1/2X4 (GAUZE/BANDAGES/DRESSINGS) ×1
CLOSURE WOUND 1/2 X4 (GAUZE/BANDAGES/DRESSINGS) ×2
CLSR STERI-STRIP ANTIMIC 1/2X4 (GAUZE/BANDAGES/DRESSINGS) ×1 IMPLANT
COVER SURGICAL LIGHT HANDLE (MISCELLANEOUS) ×3 IMPLANT
CUFF TOURN SGL QUICK 34 (TOURNIQUET CUFF) ×3
CUFF TRNQT CYL 34X4.125X (TOURNIQUET CUFF) ×1 IMPLANT
DECANTER SPIKE VIAL GLASS SM (MISCELLANEOUS) ×3 IMPLANT
DRAPE INCISE IOBAN 66X45 STRL (DRAPES) ×3 IMPLANT
DRAPE U-SHAPE 47X51 STRL (DRAPES) ×3 IMPLANT
DRSG AQUACEL AG ADV 3.5X10 (GAUZE/BANDAGES/DRESSINGS) ×3 IMPLANT
DURAPREP 26ML APPLICATOR (WOUND CARE) ×3 IMPLANT
ELECT REM PT RETURN 15FT ADLT (MISCELLANEOUS) ×3 IMPLANT
FEMUR SIGMA PS KNEE SZ 4.0N L (Femur) ×2 IMPLANT
GLOVE SRG 8 PF TXTR STRL LF DI (GLOVE) ×1 IMPLANT
GLOVE SURG ENC MOIS LTX SZ6.5 (GLOVE) ×3 IMPLANT
GLOVE SURG ENC MOIS LTX SZ8 (GLOVE) ×6 IMPLANT
GLOVE SURG UNDER POLY LF SZ7 (GLOVE) ×3 IMPLANT
GLOVE SURG UNDER POLY LF SZ8 (GLOVE) ×3
GLOVE SURG UNDER POLY LF SZ8.5 (GLOVE) ×3 IMPLANT
GOWN STRL REUS W/TWL LRG LVL3 (GOWN DISPOSABLE) ×6 IMPLANT
GOWN STRL REUS W/TWL XL LVL3 (GOWN DISPOSABLE) ×3 IMPLANT
HANDPIECE INTERPULSE COAX TIP (DISPOSABLE) ×3
HOLDER FOLEY CATH W/STRAP (MISCELLANEOUS) IMPLANT
IMMOBILIZER KNEE 20 (SOFTGOODS) ×3
IMMOBILIZER KNEE 20 THIGH 36 (SOFTGOODS) ×1 IMPLANT
KIT TURNOVER KIT A (KITS) IMPLANT
MANIFOLD NEPTUNE II (INSTRUMENTS) ×3 IMPLANT
NS IRRIG 1000ML POUR BTL (IV SOLUTION) ×3 IMPLANT
PACK TOTAL KNEE CUSTOM (KITS) ×3 IMPLANT
PADDING CAST COTTON 6X4 STRL (CAST SUPPLIES) ×4 IMPLANT
PATELLA DOME PFC 35MM (Knees) ×2 IMPLANT
PLATE ROT INSERT 10MM SIZE 4 (Plate) ×2 IMPLANT
PROTECTOR NERVE ULNAR (MISCELLANEOUS) ×3 IMPLANT
SET HNDPC FAN SPRY TIP SCT (DISPOSABLE) ×1 IMPLANT
STRIP CLOSURE SKIN 1/2X4 (GAUZE/BANDAGES/DRESSINGS) ×4 IMPLANT
SUT MNCRL AB 4-0 PS2 18 (SUTURE) ×3 IMPLANT
SUT STRATAFIX 0 PDS 27 VIOLET (SUTURE) ×3
SUT VIC AB 2-0 CT1 27 (SUTURE) ×9
SUT VIC AB 2-0 CT1 TAPERPNT 27 (SUTURE) ×3 IMPLANT
SUTURE STRATFX 0 PDS 27 VIOLET (SUTURE) ×1 IMPLANT
TIBIA MBT CEMENT (Knees) ×3 IMPLANT
TRAY FOLEY MTR SLVR 16FR STAT (SET/KITS/TRAYS/PACK) ×3 IMPLANT
TUBE SUCTION HIGH CAP CLEAR NV (SUCTIONS) ×3 IMPLANT
WATER STERILE IRR 1000ML POUR (IV SOLUTION) ×6 IMPLANT
WRAP KNEE MAXI GEL POST OP (GAUZE/BANDAGES/DRESSINGS) ×3 IMPLANT

## 2021-07-13 NOTE — Op Note (Signed)
OPERATIVE REPORT-TOTAL KNEE ARTHROPLASTY   Pre-operative diagnosis- Osteoarthritis  Left knee(s)  Post-operative diagnosis- Osteoarthritis Left knee(s)  Procedure-  Left  Total Knee Arthroplasty  Surgeon- Dione Plover. Patricia Fargo, MD  Assistant- Molli Barrows, PA-C   Anesthesia-   Adductor canal block and spinal  EBL-75 mL   Drains None  Tourniquet time-  Total Tourniquet Time Documented: Thigh (Left) - 36 minutes Total: Thigh (Left) - 36 minutes     Complications- None  Condition-PACU - hemodynamically stable.   Brief Clinical Note  [name] is a 80 y.o. year old female with end stage OA of her left knee with progressively worsening pain and dysfunction. She has constant pain, with activity and at rest and significant functional deficits with difficulties even with ADLs. She has had extensive non-op management including analgesics, injections of cortisone and viscosupplements, and home exercise program, but remains in significant pain with significant dysfunction. Radiographs show bone on bone arthritis lateral and patellofemoral with valgus deformity. She presents now for left Total Knee Arthroplasty.     Procedure in detail---   The patient is brought into the operating room and positioned supine on the operating table. After successful administration of  Adductor canal block and spinal,   a tourniquet is placed high on the  Right thigh(s) and the lower extremity is prepped and draped in the usual sterile fashion. Time out is performed by the operating team and then the  Left lower extremity is wrapped in Esmarch, knee flexed and the tourniquet inflated to 300 mmHg.       A midline incision is made with a ten blade through the subcutaneous tissue to the level of the extensor mechanism. A fresh blade is used to make a medial parapatellar arthrotomy. Soft tissue over the proximal medial tibia is subperiosteally elevated to the joint line with a knife and into the semimembranosus  bursa with a Cobb elevator. Soft tissue over the proximal lateral tibia is elevated with attention being paid to avoiding the patellar tendon on the tibial tubercle. The patella is everted, knee flexed 90 degrees and the ACL and PCL are removed. Findings are bone on bone lateral and patellofemoral with massive global osteophytes        The drill is used to create a starting hole in the distal femur and the canal is thoroughly irrigated with sterile saline to remove the fatty contents. The 5 degree Left  valgus alignment guide is placed into the femoral canal and the distal femoral cutting block is pinned to remove 10 mm off the distal femur. Resection is made with an oscillating saw.      The tibia is subluxed forward and the menisci are removed. The extramedullary alignment guide is placed referencing proximally at the medial aspect of the tibial tubercle and distally along the second metatarsal axis and tibial crest. The block is pinned to remove 37mm off the more deficient lateral  side. Resection is made with an oscillating saw. Size 3is the most appropriate size for the tibia and the proximal tibia is prepared with the modular drill and keel punch for that size.      The femoral sizing guide is placed and size 4 is most appropriate. Rotation is marked off the epicondylar axis and confirmed by creating a rectangular flexion gap at 90 degrees. The size 4 cutting block is pinned in this rotation and the anterior, posterior and chamfer cuts are made with the oscillating saw. The intercondylar block is then placed and  that cut is made.      Trial size 3 tibial component, trial size 4 narrow posterior stabilized femur and a 10  mm posterior stabilized rotating platform insert trial is placed. Full extension is achieved with excellent varus/valgus and anterior/posterior balance throughout full range of motion. The patella is everted and thickness measured to be 22  mm. Free hand resection is taken to 12 mm, a 38  template is placed, lug holes are drilled, trial patella is placed, and it tracks normally. Osteophytes are removed off the posterior femur with the trial in place. All trials are removed and the cut bone surfaces prepared with pulsatile lavage. Cement is mixed and once ready for implantation, the size 3 tibial implant, size  4 narrow posterior stabilized femoral component, and the size 35 patella are cemented in place and the patella is held with the clamp. The trial insert is placed and the knee held in full extension. The Exparel (20 ml mixed with 60 ml saline) is injected into the extensor mechanism, posterior capsule, medial and lateral gutters and subcutaneous tissues.  All extruded cement is removed and once the cement is hard the permanent 10 mm posterior stabilized rotating platform insert is placed into the tibial tray.      The wound is copiously irrigated with saline solution and the extensor mechanism closed with # 0 Stratofix suture. The tourniquet is released for a total tourniquet time of 36  minutes. Flexion against gravity is 140 degrees and the patella tracks normally. Subcutaneous tissue is closed with 2.0 vicryl and subcuticular with running 4.0 Monocryl. The incision is cleaned and dried and steri-strips and a bulky sterile dressing are applied. The limb is placed into a knee immobilizer and the patient is awakened and transported to recovery in stable condition.      Please note that a surgical assistant was a medical necessity for this procedure in order to perform it in a safe and expeditious manner. Surgical assistant was necessary to retract the ligaments and vital neurovascular structures to prevent injury to them and also necessary for proper positioning of the limb to allow for anatomic placement of the prosthesis.   Dione Plover Taraji Mungo, MD    07/13/2021, 11:00 AM

## 2021-07-13 NOTE — Anesthesia Procedure Notes (Signed)
Procedure Name: MAC Date/Time: 07/13/2021 9:44 AM Performed by: Lissa Morales, CRNA Pre-anesthesia Checklist: Patient identified, Emergency Drugs available, Suction available and Patient being monitored Patient Re-evaluated:Patient Re-evaluated prior to induction Oxygen Delivery Method: Simple face mask Placement Confirmation: positive ETCO2

## 2021-07-13 NOTE — Anesthesia Procedure Notes (Signed)
Anesthesia Regional Block: Adductor canal block   Pre-Anesthetic Checklist: , timeout performed,  Correct Patient, Correct Site, Correct Laterality,  Correct Procedure, Correct Position, site marked,  Risks and benefits discussed,  Surgical consent,  Pre-op evaluation,  At surgeon's request and post-op pain management  Laterality: Left  Prep: chloraprep       Needles:  Injection technique: Single-shot  Needle Type: Echogenic Stimulator Needle     Needle Length: 10cm  Needle Gauge: 20     Additional Needles:   Procedures:,,,, ultrasound used (permanent image in chart),,    Narrative:  Start time: 07/13/2021 8:20 AM End time: 07/13/2021 8:25 AM Injection made incrementally with aspirations every 5 mL.  Performed by: Personally  Anesthesiologist: Merlinda Frederick, MD  Additional Notes: Functioning IV was confirmed and monitors were applied.  Sterile prep and drape,hand hygiene and sterile gloves were used. Ultrasound guidance: relevant anatomy identified, needle position confirmed, local anesthetic spread visualized around nerve(s)., vascular puncture avoided. Negative aspiration and negative test dose prior to incremental administration of local anesthetic. The patient tolerated the procedure well.

## 2021-07-13 NOTE — Care Plan (Signed)
Ortho Bundle Case Management Note  Patient Details  Name: [name] MRN: [id number] Date of Birth: 10-Oct-1940  L TKA on 07-13-21 DCP:  Home with cousin.  1 story home with 1 ste.  DME:  No needs.  Has a RW and 3-in-1 PT:  [facility]                   DME Arranged:  N/A DME Agency:  NA  HH Arranged:  NA HH Agency:  NA  Additional Comments: Please contact me with any questions of if this plan should need to change.  Marianne Sofia, RN,CCM [facility]  [phone] 07/13/2021, 7:40 AM

## 2021-07-13 NOTE — Transfer of Care (Signed)
Immediate Anesthesia Transfer of Care Note  Patient: [name]  Procedure(s) Performed: TOTAL KNEE ARTHROPLASTY (Left: Knee)  Patient Location: PACU  Anesthesia Type:Spinal  Level of Consciousness: awake, alert , oriented and patient cooperative  Airway & Oxygen Therapy: Patient Spontanous Breathing and Patient connected to face mask oxygen  Post-op Assessment: Report given to RN and Post -op Vital signs reviewed and stable  Post vital signs: stable  Last Vitals:  Vitals Value Taken Time  BP 127/53 07/13/21 1126  Temp    Pulse 72 07/13/21 1129  Resp 29 07/13/21 1129  SpO2 100 % 07/13/21 1129  Vitals shown include unvalidated device data.  Last Pain:  Vitals:   07/13/21 0723  TempSrc: Oral  PainSc: 3       Patients Stated Pain Goal: 3 (11/57/26 2035)  Complications: No notable events documented.

## 2021-07-13 NOTE — Plan of Care (Signed)
Plan of care reviewed and discussed with the patient. 

## 2021-07-13 NOTE — Progress Notes (Signed)
AssistedDr. Elgie Congo with left, ultrasound guided, adductor canal block. Side rails up, monitors on throughout procedure. See vital signs in flow sheet. Tolerated Procedure well.

## 2021-07-13 NOTE — Anesthesia Postprocedure Evaluation (Signed)
Anesthesia Post Note  Patient: [name]  Procedure(s) Performed: TOTAL KNEE ARTHROPLASTY (Left: Knee)     Patient location during evaluation: PACU Anesthesia Type: MAC and Spinal Level of consciousness: oriented and awake and alert Pain management: pain level controlled Vital Signs Assessment: post-procedure vital signs reviewed and stable Respiratory status: spontaneous breathing and respiratory function stable Cardiovascular status: blood pressure returned to baseline and stable Postop Assessment: no headache, no backache, no apparent nausea or vomiting and patient able to bend at knees Anesthetic complications: no   No notable events documented.  Last Vitals:  Vitals:   07/13/21 1230 07/13/21 1245  BP: (!) 155/65 (!) 163/78  Pulse: (!) 57 (!) 58  Resp: 14 16  Temp:  (!) 36.3 C  SpO2: 100% 99%    Last Pain:  Vitals:   07/13/21 1335  TempSrc:   PainSc: 6                  Candra R Amena Dockham

## 2021-07-13 NOTE — Discharge Instructions (Addendum)
 Frank Aluisio, MD Total Joint Specialist [facility] [address] [place], [place] [address] [phone]  TOTAL KNEE REPLACEMENT POSTOPERATIVE DIRECTIONS    Knee Rehabilitation, Guidelines Following Surgery  Results after knee surgery are often greatly improved when you follow the exercise, range of motion and muscle strengthening exercises prescribed by your doctor. Safety measures are also important to protect the knee from further injury. If any of these exercises cause you to have increased pain or swelling in your knee joint, decrease the amount until you are comfortable again and slowly increase them. If you have problems or questions, call your caregiver or physical therapist for advice.   BLOOD CLOT PREVENTION Take a 10 mg Xarelto once a day for three weeks following surgery. Then take an 81 mg Aspirin once a day for three weeks. Then discontinue Aspirin. You may resume your vitamins/supplements once you have discontinued the Xarelto. Do not take any NSAIDs (Advil, Aleve, Ibuprofen, Meloxicam, etc.) until you have discontinued the Xarelto.   HOME CARE INSTRUCTIONS  Remove items at home which could result in a fall. This includes throw rugs or furniture in walking pathways.  ICE to the affected knee as much as tolerated. Icing helps control swelling. If the swelling is well controlled you will be more comfortable and rehab easier. Continue to use ice on the knee for pain and swelling from surgery. You may notice swelling that will progress down to the foot and ankle. This is normal after surgery. Elevate the leg when you are not up walking on it.    Continue to use the breathing machine which will help keep your temperature down. It is common for your temperature to cycle up and down following surgery, especially at night when you are not up moving around and exerting yourself. The breathing machine keeps your lungs expanded and your temperature  down. Do not place pillow under the operative knee, focus on keeping the knee straight while resting  DIET You may resume your previous home diet once you are discharged from the hospital.  DRESSING / WOUND CARE / SHOWERING Keep your bulky bandage on for 2 days. On the third post-operative day you may remove the Ace bandage and gauze. There is a waterproof adhesive bandage on your skin which will stay in place until your first follow-up appointment. Once you remove this you will not need to place another bandage You may begin showering 3 days following surgery, but do not submerge the incision under water.  ACTIVITY For the first 5 days, the key is rest and control of pain and swelling Do your home exercises twice a day starting on post-operative day 3. On the days you go to physical therapy, just do the home exercises once that day. You should rest, ice and elevate the leg for 50 minutes out of every hour. Get up and walk/stretch for 10 minutes per hour. After 5 days you can increase your activity slowly as tolerated. Walk with your walker as instructed. Use the walker until you are comfortable transitioning to a cane. Walk with the cane in the opposite hand of the operative leg. You may discontinue the cane once you are comfortable and walking steadily. Avoid periods of inactivity such as sitting longer than an hour when not asleep. This helps prevent blood clots.  You may discontinue the knee immobilizer once you are able to perform a straight leg raise while lying down. You may resume a sexual relationship in one month or   when given the OK by your doctor.  You may return to work once you are cleared by your doctor.  Do not drive a car for 6 weeks or until released by your surgeon.  Do not drive while taking narcotics.  TED HOSE STOCKINGS Wear the elastic stockings on both legs for three weeks following surgery during the day. You may remove them at night for sleeping.  WEIGHT  BEARING Weight bearing as tolerated with assist device (walker, cane, etc) as directed, use it as long as suggested by your surgeon or therapist, typically at least 4-6 weeks.  POSTOPERATIVE CONSTIPATION PROTOCOL Constipation - defined medically as fewer than three stools per week and severe constipation as less than one stool per week.  One of the most common issues patients have following surgery is constipation.  Even if you have a regular bowel pattern at home, your normal regimen is likely to be disrupted due to multiple reasons following surgery.  Combination of anesthesia, postoperative narcotics, change in appetite and fluid intake all can affect your bowels.  In order to avoid complications following surgery, here are some recommendations in order to help you during your recovery period.  Colace (docusate) - Pick up an over-the-counter form of Colace or another stool softener and take twice a day as long as you are requiring postoperative pain medications.  Take with a full glass of water daily.  If you experience loose stools or diarrhea, hold the colace until you stool forms back up. If your symptoms do not get better within 1 week or if they get worse, check with your doctor. Dulcolax (bisacodyl) - Pick up over-the-counter and take as directed by the product packaging as needed to assist with the movement of your bowels.  Take with a full glass of water.  Use this product as needed if not relieved by Colace only.  MiraLax (polyethylene glycol) - Pick up over-the-counter to have on hand. MiraLax is a solution that will increase the amount of water in your bowels to assist with bowel movements.  Take as directed and can mix with a glass of water, juice, soda, coffee, or tea. Take if you go more than two days without a movement. Do not use MiraLax more than once per day. Call your doctor if you are still constipated or irregular after using this medication for 7 days in a row.  If you continue  to have problems with postoperative constipation, please contact the office for further assistance and recommendations.  If you experience "the worst abdominal pain ever" or develop nausea or vomiting, please contact the office immediatly for further recommendations for treatment.  ITCHING If you experience itching with your medications, try taking only a single pain pill, or even half a pain pill at a time.  You can also use Benadryl over the counter for itching or also to help with sleep.   MEDICATIONS See your medication summary on the "After Visit Summary" that the nursing staff will review with you prior to discharge.  You may have some home medications which will be placed on hold until you complete the course of blood thinner medication.  It is important for you to complete the blood thinner medication as prescribed by your surgeon.  Continue your approved medications as instructed at time of discharge.  PRECAUTIONS If you experience chest pain or shortness of breath - call 911 immediately for transfer to the hospital emergency department.  If you develop a fever greater that 101 F, purulent   drainage from wound, increased redness or drainage from wound, foul odor from the wound/dressing, or calf pain - CONTACT YOUR SURGEON.                                                   FOLLOW-UP APPOINTMENTS Make sure you keep all of your appointments after your operation with your surgeon and caregivers. You should call the office at the above phone number and make an appointment for approximately two weeks after the date of your surgery or on the date instructed by your surgeon outlined in the "After Visit Summary".  RANGE OF MOTION AND STRENGTHENING EXERCISES  Rehabilitation of the knee is important following a knee injury or an operation. After just a few days of immobilization, the muscles of the thigh which control the knee become weakened and shrink (atrophy). Knee exercises are designed to build up  the tone and strength of the thigh muscles and to improve knee motion. Often times heat used for twenty to thirty minutes before working out will loosen up your tissues and help with improving the range of motion but do not use heat for the first two weeks following surgery. These exercises can be done on a training (exercise) mat, on the floor, on a table or on a bed. Use what ever works the best and is most comfortable for you Knee exercises include:  Leg Lifts - While your knee is still immobilized in a splint or cast, you can do straight leg raises. Lift the leg to 60 degrees, hold for 3 sec, and slowly lower the leg. Repeat 10-20 times 2-3 times daily. Perform this exercise against resistance later as your knee gets better.  Quad and Hamstring Sets - Tighten up the muscle on the front of the thigh (Quad) and hold for 5-10 sec. Repeat this 10-20 times hourly. Hamstring sets are done by pushing the foot backward against an object and holding for 5-10 sec. Repeat as with quad sets.  Leg Slides: Lying on your back, slowly slide your foot toward your buttocks, bending your knee up off the floor (only go as far as is comfortable). Then slowly slide your foot back down until your leg is flat on the floor again. Angel Wings: Lying on your back spread your legs to the side as far apart as you can without causing discomfort.  A rehabilitation program following serious knee injuries can speed recovery and prevent re-injury in the future due to weakened muscles. Contact your doctor or a physical therapist for more information on knee rehabilitation.   POST-OPERATIVE OPIOID TAPER INSTRUCTIONS: It is important to wean off of your opioid medication as soon as possible. If you do not need pain medication after your surgery it is ok to stop day one. Opioids include: Codeine, Hydrocodone(Norco, Vicodin), Oxycodone(Percocet, oxycontin) and hydromorphone amongst others.  Long term and even short term use of opiods can  cause: Increased pain response Dependence Constipation Depression Respiratory depression And more.  Withdrawal symptoms can include Flu like symptoms Nausea, vomiting And more Techniques to manage these symptoms Hydrate well Eat regular healthy meals Stay active Use relaxation techniques(deep breathing, meditating, yoga) Do Not substitute Alcohol to help with tapering If you have been on opioids for less than two weeks and do not have pain than it is ok to stop all together.  Plan to   wean off of opioids This plan should start within one week post op of your joint replacement. Maintain the same interval or time between taking each dose and first decrease the dose.  Cut the total daily intake of opioids by one tablet each day Next start to increase the time between doses. The last dose that should be eliminated is the evening dose.   IF YOU ARE TRANSFERRED TO A SKILLED REHAB FACILITY If the patient is transferred to a skilled rehab facility following release from the hospital, a list of the current medications will be sent to the facility for the patient to continue.  When discharged from the skilled rehab facility, please have the facility set up the patient's Home Health Physical Therapy prior to being released. Also, the skilled facility will be responsible for providing the patient with their medications at time of release from the facility to include their pain medication, the muscle relaxants, and their blood thinner medication. If the patient is still at the rehab facility at time of the two week follow up appointment, the skilled rehab facility will also need to assist the patient in arranging follow up appointment in our office and any transportation needs.  MAKE SURE YOU:  Understand these instructions.  Get help right away if you are not doing well or get worse.   DENTAL ANTIBIOTICS:  In most cases prophylactic antibiotics for Dental procdeures after total joint surgery are  not necessary.  Exceptions are as follows:  1. History of prior total joint infection  2. Severely immunocompromised (Organ Transplant, cancer chemotherapy, Rheumatoid biologic meds such as Humera)  3. Poorly controlled diabetes (A1C &gt; 8.0, blood glucose over 200)  If you have one of these conditions, contact your surgeon for an antibiotic prescription, prior to your dental procedure.    Pick up stool softner and laxative for home use following surgery while on pain medications. Do not submerge incision under water. Please use good hand washing techniques while changing dressing each day. May shower starting three days after surgery. Please use a clean towel to pat the incision dry following showers. Continue to use ice for pain and swelling after surgery. Do not use any lotions or creams on the incision until instructed by your surgeon.  Information on my medicine - XARELTO (Rivaroxaban)  Why was Xarelto prescribed for you? Xarelto was prescribed for you to reduce the risk of blood clots forming after orthopedic surgery. The medical term for these abnormal blood clots is venous thromboembolism (VTE).  What do you need to know about xarelto ? Take your Xarelto ONCE DAILY at the same time every day. You may take it either with or without food.  If you have difficulty swallowing the tablet whole, you may crush it and mix in applesauce just prior to taking your dose.  Take Xarelto exactly as prescribed by your doctor and DO NOT stop taking Xarelto without talking to the doctor who prescribed the medication.  Stopping without other VTE prevention medication to take the place of Xarelto may increase your risk of developing a clot.  After discharge, you should have regular check-up appointments with your healthcare provider that is prescribing your Xarelto.    What do you do if you miss a dose? If you miss a dose, take it as soon as you remember on the same day then  continue your regularly scheduled once daily regimen the next day. Do not take two doses of Xarelto on the same day.   Important   Safety Information A possible side effect of Xarelto is bleeding. You should call your healthcare provider right away if you experience any of the following: Bleeding from an injury or your nose that does not stop. Unusual colored urine (red or dark brown) or unusual colored stools (red or black). Unusual bruising for unknown reasons. A serious fall or if you hit your head (even if there is no bleeding).  Some medicines may interact with Xarelto and might increase your risk of bleeding while on Xarelto. To help avoid this, consult your healthcare provider or pharmacist prior to using any new prescription or non-prescription medications, including herbals, vitamins, non-steroidal anti-inflammatory drugs (NSAIDs) and supplements.  This website has more information on Xarelto: www.xarelto.com.    

## 2021-07-13 NOTE — Anesthesia Procedure Notes (Signed)
Spinal  Patient location during procedure: OR End time: 07/13/2021 9:51 AM Reason for block: surgical anesthesia Staffing Performed: resident/CRNA  Anesthesiologist: Nolon Nations, MD Resident/CRNA: Lissa Morales, CRNA Preanesthetic Checklist Completed: patient identified, IV checked, site marked, risks and benefits discussed, surgical consent, monitors and equipment checked, pre-op evaluation and timeout performed Spinal Block Patient position: sitting Prep: DuraPrep Patient monitoring: heart rate, continuous pulse ox and blood pressure Approach: midline Location: L3-4 Injection technique: single-shot Needle Needle type: Pencan  Needle gauge: 24 G Needle length: 9 cm Assessment Events: CSF return Additional Notes  Pt tolerated procedure well. Spinal kit within date. Spinal under sterile conditions. Good CSF flow x2, negative heme.

## 2021-07-13 NOTE — Evaluation (Signed)
Physical Therapy Evaluation Patient Details Name: [name] MRN: [id number] DOB: 03/12/1941 Today's Date: 07/13/2021  History of Present Illness  Patient is 80 y.o. female s/p Lt TKA on 07/13/21 with PMH significant for OA, GERD, HTN, HLD, hypothyroidism, macular degeneration, Rt TKA, bil THA.    Clinical Impression  [name] is a 80 y.o. female POD 0 s/p Lt TKA. Patient reports independence with mobility at baseline. Patient is now limited by functional impairments (see PT problem list below) and requires min assist for transfers and gait with RW. Patient was able to ambulate ~10 feet with RW and min assist; distance limited due to pain. Patient instructed in exercise to facilitate circulation to manage edema and reduce risk of DVT. Patient will benefit from continued skilled PT interventions to address impairments and progress towards PLOF. Acute PT will follow to progress mobility and stair training in preparation for safe discharge home.        Recommendations for follow up therapy are one component of a multi-disciplinary discharge planning process, led by the attending physician.  Recommendations may be updated based on patient status, additional functional criteria and insurance authorization.  Follow Up Recommendations Follow physician's recommendations for discharge plan and follow up therapies    Assistance Recommended at Discharge Frequent or constant Supervision/Assistance  Functional Status Assessment Patient has had a recent decline in their functional status and demonstrates the ability to make significant improvements in function in a reasonable and predictable amount of time.  Equipment Recommendations  None recommended by PT    Recommendations for Other Services       Precautions / Restrictions Precautions Precautions: Fall Restrictions Weight Bearing Restrictions: No Other Position/Activity Restrictions: WBAT      Mobility  Bed Mobility Overal bed  mobility: Needs Assistance Bed Mobility: Supine to Sit     Supine to sit: Min assist;HOB elevated     General bed mobility comments: cues to use bed rail, assist needed to fully roll and raise trunk upright. pt initiated bringing LE's off EOB. Assist with bed pad to scoot forward and place feet on floor.    Transfers Overall transfer level: Needs assistance Equipment used: Rolling walker (2 wheels) Transfers: Sit to/from Stand Sit to Stand: Min assist;From elevated surface           General transfer comment: bed elevated and pt required cues for hand placement on RW, bil UE on RW and min assist to fully rise to stand.    Ambulation/Gait Ambulation/Gait assistance: Min assist Gait Distance (Feet): 10 Feet Assistive device: Rolling walker (2 wheels) Gait Pattern/deviations: Step-to pattern;Decreased stride length;Decreased weight shift to left;Antalgic Gait velocity: decr     General Gait Details: cues to keep walker in safe proximity, pt maintained throughout. assist to steady and pt with good use of UE's to prevent Lt knee buckling and reduce pain.  Stairs            Wheelchair Mobility    Modified Rankin (Stroke Patients Only)       Balance Overall balance assessment: Needs assistance Sitting-balance support: Feet supported Sitting balance-Leahy Scale: Good     Standing balance support: Reliant on assistive device for balance;During functional activity;Bilateral upper extremity supported Standing balance-Leahy Scale: Poor                               Pertinent Vitals/Pain Pain Assessment: 0-10 Pain Score: 7  Pain Location: Lt knee Pain  Descriptors / Indicators: Aching;Discomfort Pain Intervention(s): Limited activity within patient's tolerance;Monitored during session;Repositioned    Home Living Family/patient expects to be discharged to:: Private residence Living Arrangements: Alone Available Help at Discharge: Family Type of Home:  House Home Access: Stairs to enter Entrance Stairs-Rails: None Entrance Stairs-Number of Steps: 1   Home Layout: One level Home Equipment: Conservation officer, nature (2 wheels);Cane - single point;BSC/3in1 Additional Comments: lives alone, son is coming from out of town and staying to help, also her friend will stay and help.    Prior Function Prior Level of Function : Independent/Modified Independent                     Hand Dominance   Dominant Hand: Right    Extremity/Trunk Assessment   Upper Extremity Assessment Upper Extremity Assessment: Overall WFL for tasks assessed    Lower Extremity Assessment Lower Extremity Assessment: LLE deficits/detail LLE Deficits / Details: limited by pain, good quad activation, no extensor lag LLE Sensation: WNL LLE Coordination: WNL    Cervical / Trunk Assessment Cervical / Trunk Assessment: Normal  Communication   Communication: No difficulties  Cognition Arousal/Alertness: Awake/alert Behavior During Therapy: WFL for tasks assessed/performed Overall Cognitive Status: Within Functional Limits for tasks assessed                                          General Comments      Exercises Total Joint Exercises Ankle Circles/Pumps: AROM;Both;20 reps;Seated   Assessment/Plan    PT Assessment Patient needs continued PT services  PT Problem List Decreased strength;Decreased range of motion;Decreased activity tolerance;Decreased balance;Decreased mobility;Decreased knowledge of use of DME;Decreased knowledge of precautions;Pain       PT Treatment Interventions DME instruction;Gait training;Stair training;Functional mobility training;Therapeutic activities;Therapeutic exercise;Balance training;Patient/family education    PT Goals (Current goals can be found in the Care Plan section)  Acute Rehab PT Goals Patient Stated Goal: regain independence and stay active PT Goal Formulation: With patient Time For Goal Achievement:  07/20/21 Potential to Achieve Goals: Good    Frequency 7X/week   Barriers to discharge        Co-evaluation               AM-PAC PT "6 Clicks" Mobility  Outcome Measure Help needed turning from your back to your side while in a flat bed without using bedrails?: A Little Help needed moving from lying on your back to sitting on the side of a flat bed without using bedrails?: A Little Help needed moving to and from a bed to a chair (including a wheelchair)?: A Little Help needed standing up from a chair using your arms (e.g., wheelchair or bedside chair)?: A Little Help needed to walk in hospital room?: A Little Help needed climbing 3-5 steps with a railing? : A Lot 6 Click Score: 17    End of Session Equipment Utilized During Treatment: Gait belt Activity Tolerance: Patient tolerated treatment well Patient left: in chair;with call bell/phone within reach;with chair alarm set;with family/visitor present Nurse Communication: Mobility status PT Visit Diagnosis: Muscle weakness (generalized) (M62.81);Difficulty in walking, not elsewhere classified (R26.2);Pain Pain - Right/Left: Left Pain - part of body: Knee    Time: 2706-2376 PT Time Calculation (min) (ACUTE ONLY): 30 min   Charges:   PT Evaluation $PT Eval Low Complexity: 1 Low PT Treatments $Gait Training: 8-22 mins  Verner Mould, DPT Acute Rehabilitation Services Office [phone] Pager [phone]   Jacques Navy 07/13/2021, 5:44 PM

## 2021-07-13 NOTE — Interval H&P Note (Signed)
History and Physical Interval Note:  07/13/2021 7:02 AM  [name]  has presented today for surgery, with the diagnosis of left knee osteoarthritis.  The various methods of treatment have been discussed with the patient and family. After consideration of risks, benefits and other options for treatment, the patient has consented to  Procedure(s): TOTAL KNEE ARTHROPLASTY (Left) as a surgical intervention.  The patient's history has been reviewed, patient examined, no change in status, stable for surgery.  I have reviewed the patient's chart and labs.  Questions were answered to the patient's satisfaction.     Pilar Plate Arlinda Barcelona

## 2021-07-13 NOTE — Progress Notes (Signed)
Orthopedic Tech Progress Note Patient Details:  [name] [birth year] [id number]  CPM Left Knee CPM Left Knee: On Left Knee Flexion (Degrees): 40 Left Knee Extension (Degrees): 10  Post Interventions Patient Tolerated: Well Instructions Provided: Care of device, Adjustment of device  [name] 07/13/2021, 11:40 AM

## 2021-07-14 DIAGNOSIS — M1712 Unilateral primary osteoarthritis, left knee: Secondary | ICD-10-CM | POA: Diagnosis not present

## 2021-07-14 DIAGNOSIS — I10 Essential (primary) hypertension: Secondary | ICD-10-CM | POA: Diagnosis not present

## 2021-07-14 DIAGNOSIS — Z79899 Other long term (current) drug therapy: Secondary | ICD-10-CM | POA: Diagnosis not present

## 2021-07-14 DIAGNOSIS — E039 Hypothyroidism, unspecified: Secondary | ICD-10-CM | POA: Diagnosis not present

## 2021-07-14 LAB — CBC
HCT: 34.5 % — ABNORMAL LOW (ref 36.0–46.0)
Hemoglobin: 11.6 g/dL — ABNORMAL LOW (ref 12.0–15.0)
MCH: 29.3 pg (ref 26.0–34.0)
MCHC: 33.6 g/dL (ref 30.0–36.0)
MCV: 87.1 fL (ref 80.0–100.0)
Platelets: 174 10*3/uL (ref 150–400)
RBC: 3.96 MIL/uL (ref 3.87–5.11)
RDW: 13.6 % (ref 11.5–15.5)
WBC: 8.5 10*3/uL (ref 4.0–10.5)
nRBC: 0 % (ref 0.0–0.2)

## 2021-07-14 LAB — BASIC METABOLIC PANEL
Anion gap: 9 (ref 5–15)
BUN: 18 mg/dL (ref 8–23)
CO2: 22 mmol/L (ref 22–32)
Calcium: 8.6 mg/dL — ABNORMAL LOW (ref 8.9–10.3)
Chloride: 102 mmol/L (ref 98–111)
Creatinine, Ser: 0.62 mg/dL (ref 0.44–1.00)
GFR, Estimated: >60 mL/min (ref >60)
Glucose, Bld: 120 mg/dL — ABNORMAL HIGH (ref 70–99)
Potassium: 4 mmol/L (ref 3.5–5.1)
Sodium: 133 mmol/L — ABNORMAL LOW (ref 135–145)

## 2021-07-14 MED ORDER — TRAMADOL HCL 50 MG PO TABS
50.0000 mg | ORAL_TABLET | Freq: Four times a day (QID) | ORAL | 0 refills | Status: DC | PRN
Start: 2021-07-14 — End: 2021-08-04

## 2021-07-14 MED ORDER — RIVAROXABAN 10 MG PO TABS: 10.0000 mg | ORAL_TABLET | Freq: Every day | ORAL | 0 refills | Status: AC

## 2021-07-14 MED ORDER — OXYCODONE HCL 5 MG PO TABS: 5.0000 mg | ORAL_TABLET | Freq: Four times a day (QID) | ORAL | 0 refills | Status: DC | PRN

## 2021-07-14 MED ORDER — METHOCARBAMOL 500 MG PO TABS: 500.0000 mg | ORAL_TABLET | Freq: Four times a day (QID) | ORAL | 0 refills | Status: DC | PRN

## 2021-07-14 NOTE — TOC Transition Note (Signed)
Transition of Care [facility]) - CM/SW Discharge Note   Patient Details  Name: [name] MRN: [id number] Date of Birth: [birth year]  Transition of Care [facility]) CM/SW Contact:  Lennart Pall, LCSW Phone Number: 07/14/2021, 9:54 AM   Clinical Narrative:    Met with pt and confirming she has all needed DME at home.  Plan for OPPT at [place].  No TOC needs.   Final next level of care: OP Rehab Barriers to Discharge: No Barriers Identified   Patient Goals and CMS Choice Patient states their goals for this hospitalization and ongoing recovery are:: return home      Discharge Placement                       Discharge Plan and Services                DME Arranged: N/A DME Agency: NA       HH Arranged: NA HH Agency: NA        Social Determinants of Health (SDOH) Interventions     Readmission Risk Interventions No flowsheet data found.

## 2021-07-14 NOTE — Progress Notes (Signed)
Physical Therapy Treatment Patient Details Name: [name] MRN: [id number] DOB: 12/19/1940 Today's Date: 07/14/2021   History of Present Illness Patient is 80 y.o. female s/p Lt TKA on 07/13/21 with PMH significant for OA, GERD, HTN, HLD, hypothyroidism, macular degeneration, Rt TKA, bil THA.    PT Comments    Pt is slowly progressing with mobility. She is struggling with sit to stand, she requires multiple attempts to successfully rise and needs min A to power up. Mild loss of balance x 1 while ambulating 50' with RW this session. She is not yet ready to DC home from a PT standpoint.     Recommendations for follow up therapy are one component of a multi-disciplinary discharge planning process, led by the attending physician.  Recommendations may be updated based on patient status, additional functional criteria and insurance authorization.  Follow Up Recommendations  Outpatient PT     Assistance Recommended at Discharge Frequent or constant Supervision/Assistance  Equipment Recommendations  None recommended by PT    Recommendations for Other Services       Precautions / Restrictions Precautions Precautions: Fall Restrictions Weight Bearing Restrictions: No Other Position/Activity Restrictions: WBAT     Mobility  Bed Mobility               General bed mobility comments: up in recliner    Transfers Overall transfer level: Needs assistance Equipment used: Rolling walker (2 wheels) Transfers: Sit to/from Stand Sit to Stand: Min assist;From elevated surface           General transfer comment: multiple attempts required to successfully come to upright position, pt uses momentum for sit to stand    Ambulation/Gait Ambulation/Gait assistance: Min guard Gait Distance (Feet): 60 Feet Assistive device: Rolling walker (2 wheels) Gait Pattern/deviations: Step-to pattern;Decreased stride length;Decreased weight shift to left;Antalgic Gait velocity: decr      General Gait Details: steady, no loss of balance, distance limited by pain/fatigue, mild loss of balance x 1 requiring min A to recover   Stairs             Wheelchair Mobility    Modified Rankin (Stroke Patients Only)       Balance Overall balance assessment: Needs assistance Sitting-balance support: Feet supported Sitting balance-Leahy Scale: Good     Standing balance support: Reliant on assistive device for balance;During functional activity;Bilateral upper extremity supported Standing balance-Leahy Scale: Poor                              Cognition Arousal/Alertness: Awake/alert Behavior During Therapy: WFL for tasks assessed/performed Overall Cognitive Status: Within Functional Limits for tasks assessed                                          Exercises Total Joint Exercises Ankle Circles/Pumps: AROM;Both;20 reps;Seated Quad Sets: AROM;Left;10 reps;Supine Short Arc Quad: AROM;Left;10 reps;Supine;AAROM Heel Slides: AAROM;Left;10 reps;Supine Hip ABduction/ADduction: AAROM;Left;10 reps;Supine Straight Leg Raises: AROM;Left;AAROM;10 reps;Supine Long Arc Quad: AROM;Left;5 reps;Seated Knee Flexion: AAROM;Left;10 reps;Supine    General Comments        Pertinent Vitals/Pain Pain Score: 8  Pain Location: Lt knee Pain Descriptors / Indicators: Aching;Discomfort;Operative site guarding Pain Intervention(s): Limited activity within patient's tolerance;Monitored during session;Premedicated before session;Ice applied    Home Living  Prior Function            PT Goals (current goals can now be found in the care plan section) Acute Rehab PT Goals Patient Stated Goal: regain independence and stay active PT Goal Formulation: With patient Time For Goal Achievement: 07/20/21 Potential to Achieve Goals: Good Progress towards PT goals: Progressing toward goals    Frequency    7X/week       PT Plan Current plan remains appropriate    Co-evaluation              AM-PAC PT "6 Clicks" Mobility   Outcome Measure  Help needed turning from your back to your side while in a flat bed without using bedrails?: A Little Help needed moving from lying on your back to sitting on the side of a flat bed without using bedrails?: A Little Help needed moving to and from a bed to a chair (including a wheelchair)?: A Little Help needed standing up from a chair using your arms (e.g., wheelchair or bedside chair)?: A Little Help needed to walk in hospital room?: A Little Help needed climbing 3-5 steps with a railing? : A Lot 6 Click Score: 17    End of Session Equipment Utilized During Treatment: Gait belt Activity Tolerance: Patient tolerated treatment well Patient left: in chair;with call bell/phone within reach;with chair alarm set Nurse Communication: Mobility status PT Visit Diagnosis: Muscle weakness (generalized) (M62.81);Difficulty in walking, not elsewhere classified (R26.2);Pain Pain - Right/Left: Left Pain - part of body: Knee     Time: 1326-1405 PT Time Calculation (min) (ACUTE ONLY): 39 min  Charges:  $Gait Training: 8-22 mins $Therapeutic Exercise: 8-22 mins $Therapeutic Activity: 8-22 mins                     Blondell Reveal Kistler PT 07/14/2021  Acute Rehabilitation Services Pager [phone] Office [phone]

## 2021-07-14 NOTE — Progress Notes (Signed)
   Subjective: 1 Day Post-Op Procedure(s) (LRB): TOTAL KNEE ARTHROPLASTY (Left) Patient reports pain as mild.   Patient seen in rounds by Dr. Wynelle Link. Patient is well, and has had no acute complaints or problems We will continue therapy today.   Objective: Vital signs in last 24 hours: Temp:  [97.4 F (36.3 C)-98.7 F (37.1 C)] 98.7 F (37.1 C) (11/08 0301) Pulse Rate:  [55-72] 72 (11/08 0301) Resp:  [10-29] 18 (11/08 0301) BP: (107-163)/(53-95) 126/54 (11/08 0301) SpO2:  [93 %-100 %] 98 % (11/08 0301)  Intake/Output from previous day:  Intake/Output Summary (Last 24 hours) at 07/14/2021 0727 Last data filed at 07/14/2021 0512 Gross per 24 hour  Intake 4035.73 ml  Output 2025 ml  Net 2010.73 ml     Intake/Output this shift: No intake/output data recorded.  Labs: Recent Labs    07/14/21 0315  HGB 11.6*   Recent Labs    07/14/21 0315  WBC 8.5  RBC 3.96  HCT 34.5*  PLT 174   Recent Labs    07/14/21 0315  NA 133*  K 4.0  CL 102  CO2 22  BUN 18  CREATININE 0.62  GLUCOSE 120*  CALCIUM 8.6*   No results for input(s): LABPT, INR in the last 72 hours.  Exam: General - Patient is Alert and Oriented Extremity - Neurologically intact Neurovascular intact Sensation intact distally Dorsiflexion/Plantar flexion intact Dressing - dressing C/D/I Motor Function - intact, moving foot and toes well on exam.   Past Medical History:  Diagnosis Date   Arthritis    Bladder spasms    "spastic bladder" wears pads   Cancer ([place])    Skin cancer -scalp and eyebrow"basal cell"   Complication of anesthesia    , woke up too early from surgery and pt had floppy fish syndrome   GERD (gastroesophageal reflux disease)    occ. frequent "hiccoughs" after "eating, indigestion"   Hyperlipemia    Hypertension    Hypothyroidism    Macular degeneration    Trigger finger, left    alternate fingers affected. -Occ. shooting pain.   Wears glasses     Assessment/Plan: 1 Day  Post-Op Procedure(s) (LRB): TOTAL KNEE ARTHROPLASTY (Left) Active Problems:   Primary osteoarthritis of left knee  Estimated body mass index is 38.59 kg/m as calculated from the following:   Height as of this encounter: 5\' 2"  (1.575 m).   Weight as of this encounter: 95.7 kg. Advance diet Up with therapy D/C IV fluids   Patient's anticipated LOS is less than 2 midnights, meeting these requirements: - Lives within 1 hour of care - Has a competent adult at home to recover with post-op recover - NO history of  - Chronic pain requiring opiods  - Diabetes  - Coronary Artery Disease  - Heart failure  - Heart attack  - Stroke  - DVT/VTE  - Cardiac arrhythmia  - Respiratory Failure/COPD  - Renal failure  - Anemia  - Advanced Liver disease  DVT Prophylaxis - Xarelto Weight bearing as tolerated. Continue therapy.  Plan is to go Home after hospital stay. Possible discharge this afternoon if doing exceptionally well. Will have low threshold for letting her stay an additional night if slower to progress with mobility. Scheduled for OPPT with [facility] Follow-up in the office in 2 weeks  The PDMP database was reviewed today prior to any opioid medications being prescribed to this patient.  Theresa Duty, PA-C Orthopedic Surgery [phone] 07/14/2021, 7:27 AM

## 2021-07-14 NOTE — Progress Notes (Addendum)
Physical Therapy Treatment Patient Details Name: [name] MRN: [id number] DOB: 12-02-1940 Today's Date: 07/14/2021   History of Present Illness Patient is 80 y.o. female s/p Lt TKA on 07/13/21 with PMH significant for OA, GERD, HTN, HLD, hypothyroidism, macular degeneration, Rt TKA, bil THA.    PT Comments    Pt ambulated 69' with RW, distance limited by fatigue. Initiated TKA HEP. Pt is progressing well with mobility. Will plan a second session this afternoon to progress ambulation distance, stair training, and to complete  HEP training.     Recommendations for follow up therapy are one component of a multi-disciplinary discharge planning process, led by the attending physician.  Recommendations may be updated based on patient status, additional functional criteria and insurance authorization.  Follow Up Recommendations  Outpatient PT     Assistance Recommended at Discharge Frequent or constant Supervision/Assistance  Equipment Recommendations  None recommended by PT    Recommendations for Other Services       Precautions / Restrictions Precautions Precautions: Fall Restrictions Weight Bearing Restrictions: No Other Position/Activity Restrictions: WBAT     Mobility  Bed Mobility Overal bed mobility: Needs Assistance Bed Mobility: Supine to Sit     Supine to sit: Min assist;HOB elevated     General bed mobility comments: cues to use bed rail, assist needed to fully roll and raise trunk upright. pt initiated bringing LE's off EOB. Assist with bed pad to scoot forward and place feet on floor.    Transfers Overall transfer level: Needs assistance Equipment used: Rolling walker (2 wheels) Transfers: Sit to/from Stand Sit to Stand: Min assist;From elevated surface           General transfer comment: bed elevated and pt required cues for hand placement on RW, bil UE on RW and min assist to fully rise to stand.    Ambulation/Gait Ambulation/Gait assistance:  Min guard Gait Distance (Feet): 50 Feet Assistive device: Rolling walker (2 wheels) Gait Pattern/deviations: Step-to pattern;Decreased stride length;Decreased weight shift to left;Antalgic Gait velocity: decr     General Gait Details: cues to keep walker in safe proximity, pt maintained throughout. assist to steady and pt with good use of UE's to prevent Lt knee buckling and reduce pain. Distance limited by fatigue   Stairs             Wheelchair Mobility    Modified Rankin (Stroke Patients Only)       Balance Overall balance assessment: Needs assistance Sitting-balance support: Feet supported Sitting balance-Leahy Scale: Good     Standing balance support: Reliant on assistive device for balance;During functional activity;Bilateral upper extremity supported Standing balance-Leahy Scale: Poor                              Cognition Arousal/Alertness: Awake/alert Behavior During Therapy: WFL for tasks assessed/performed Overall Cognitive Status: Within Functional Limits for tasks assessed                                          Exercises Total Joint Exercises Ankle Circles/Pumps: AROM;Both;20 reps;Seated Quad Sets: AROM;Left;10 reps;Supine Short Arc Quad: AROM;Left;10 reps;Supine;AAROM Heel Slides: AAROM;Left;10 reps;Supine Hip ABduction/ADduction: AAROM;Left;10 reps;Supine Straight Leg Raises: AROM;Left;AAROM;10 reps;Supine Knee Flexion: AAROM;Left;10 reps;Supine Goniometric ROM: 5-50* AAROM L knee    General Comments        Pertinent Vitals/Pain Pain Score: 8  Pain Location: Lt knee Pain Descriptors / Indicators: Aching;Discomfort Pain Intervention(s): Premedicated before session;Monitored during session;Ice applied;Repositioned;Limited activity within patient's tolerance    Home Living                          Prior Function            PT Goals (current goals can now be found in the care plan section) Acute  Rehab PT Goals Patient Stated Goal: regain independence and stay active PT Goal Formulation: With patient Time For Goal Achievement: 07/20/21 Potential to Achieve Goals: Good Progress towards PT goals: Progressing toward goals    Frequency    7X/week      PT Plan Current plan remains appropriate    Co-evaluation              AM-PAC PT "6 Clicks" Mobility   Outcome Measure  Help needed turning from your back to your side while in a flat bed without using bedrails?: A Little Help needed moving from lying on your back to sitting on the side of a flat bed without using bedrails?: A Little Help needed moving to and from a bed to a chair (including a wheelchair)?: A Little Help needed standing up from a chair using your arms (e.g., wheelchair or bedside chair)?: A Little Help needed to walk in hospital room?: A Little Help needed climbing 3-5 steps with a railing? : A Lot 6 Click Score: 17    End of Session Equipment Utilized During Treatment: Gait belt Activity Tolerance: Patient tolerated treatment well Patient left: in chair;with call bell/phone within reach;with chair alarm set Nurse Communication: Mobility status PT Visit Diagnosis: Muscle weakness (generalized) (M62.81);Difficulty in walking, not elsewhere classified (R26.2);Pain Pain - Right/Left: Left Pain - part of body: Knee     Time: 4680-3212 PT Time Calculation (min) (ACUTE ONLY): 30 min  Charges:  $Gait Training: 8-22 mins $Therapeutic Exercise: 8-22 mins                     Blondell Reveal Kistler PT 07/14/2021  Acute Rehabilitation Services Pager [phone] Office [phone]

## 2021-07-15 ENCOUNTER — Encounter (HOSPITAL_COMMUNITY): Payer: Self-pay | Admitting: Orthopedic Surgery

## 2021-07-15 DIAGNOSIS — Z79899 Other long term (current) drug therapy: Secondary | ICD-10-CM | POA: Diagnosis not present

## 2021-07-15 DIAGNOSIS — E039 Hypothyroidism, unspecified: Secondary | ICD-10-CM | POA: Diagnosis not present

## 2021-07-15 DIAGNOSIS — I10 Essential (primary) hypertension: Secondary | ICD-10-CM | POA: Diagnosis not present

## 2021-07-15 DIAGNOSIS — M1712 Unilateral primary osteoarthritis, left knee: Secondary | ICD-10-CM | POA: Diagnosis not present

## 2021-07-15 LAB — CBC
HCT: 32.9 % — ABNORMAL LOW (ref 36.0–46.0)
Hemoglobin: 11 g/dL — ABNORMAL LOW (ref 12.0–15.0)
MCH: 29.3 pg (ref 26.0–34.0)
MCHC: 33.4 g/dL (ref 30.0–36.0)
MCV: 87.7 fL (ref 80.0–100.0)
Platelets: 181 10*3/uL (ref 150–400)
RBC: 3.75 MIL/uL — ABNORMAL LOW (ref 3.87–5.11)
RDW: 14.2 % (ref 11.5–15.5)
WBC: 9.5 10*3/uL (ref 4.0–10.5)
nRBC: 0 % (ref 0.0–0.2)

## 2021-07-15 LAB — BASIC METABOLIC PANEL
Anion gap: 5 (ref 5–15)
BUN: 16 mg/dL (ref 8–23)
CO2: 27 mmol/L (ref 22–32)
Calcium: 8.8 mg/dL — ABNORMAL LOW (ref 8.9–10.3)
Chloride: 99 mmol/L (ref 98–111)
Creatinine, Ser: 0.61 mg/dL (ref 0.44–1.00)
GFR, Estimated: >60 mL/min (ref >60)
Glucose, Bld: 119 mg/dL — ABNORMAL HIGH (ref 70–99)
Potassium: 5.1 mmol/L (ref 3.5–5.1)
Sodium: 131 mmol/L — ABNORMAL LOW (ref 135–145)

## 2021-07-15 NOTE — Progress Notes (Signed)
Physical Therapy Treatment Patient Details Name: [name] MRN: [id number] DOB: 1940-10-22 Today's Date: 07/15/2021   History of Present Illness Patient is 80 y.o. female s/p Lt TKA on 07/13/21 with PMH significant for OA, GERD, HTN, HLD, hypothyroidism, macular degeneration, Rt TKA, bil THA.    PT Comments    Pt is progressing with mobility, she is more alert today. She ambulated 30' with RW. Reviewed TKA HEP and initiated stair training. Pt would like to practice stair training again, will plan to see this afternoon, then expect she'll be ready to DC home.     Recommendations for follow up therapy are one component of a multi-disciplinary discharge planning process, led by the attending physician.  Recommendations may be updated based on patient status, additional functional criteria and insurance authorization.  Follow Up Recommendations  Outpatient PT     Assistance Recommended at Discharge Frequent or constant Supervision/Assistance  Equipment Recommendations  None recommended by PT    Recommendations for Other Services       Precautions / Restrictions Precautions Precautions: Fall;Knee Precaution Booklet Issued: Yes (comment) Precaution Comments: reviewedno pillow under knee Restrictions Weight Bearing Restrictions: No Other Position/Activity Restrictions: WBAT     Mobility  Bed Mobility               General bed mobility comments: up in recliner    Transfers Overall transfer level: Needs assistance Equipment used: Rolling walker (2 wheels) Transfers: Sit to/from Stand Sit to Stand: Min assist;From elevated surface           General transfer comment: multiple attempts required to successfully come to upright position, pt uses momentum for sit to stand    Ambulation/Gait Ambulation/Gait assistance: Min guard Gait Distance (Feet): 85 Feet Assistive device: Rolling walker (2 wheels) Gait Pattern/deviations: Step-to pattern;Decreased stride  length;Decreased weight shift to left;Antalgic Gait velocity: decr     General Gait Details: steady, distance limited by pain/fatigue   Stairs Stairs: Yes   Stair Management: No rails;Backwards;With walker;Step to pattern Number of Stairs: 1 General stair comments: VCs for sequencing, min A to steady RW   Wheelchair Mobility    Modified Rankin (Stroke Patients Only)       Balance Overall balance assessment: Needs assistance Sitting-balance support: Feet supported Sitting balance-Leahy Scale: Good     Standing balance support: Reliant on assistive device for balance;During functional activity;Bilateral upper extremity supported Standing balance-Leahy Scale: Poor                              Cognition Arousal/Alertness: Awake/alert Behavior During Therapy: WFL for tasks assessed/performed Overall Cognitive Status: Within Functional Limits for tasks assessed                                          Exercises Total Joint Exercises Ankle Circles/Pumps: AROM;Both;20 reps;Seated Quad Sets: AROM;Left;10 reps;Supine Short Arc Quad: AROM;Left;10 reps;Supine;AAROM Heel Slides: AAROM;Left;10 reps;Supine Hip ABduction/ADduction: AAROM;Left;10 reps;Supine Straight Leg Raises: AROM;Left;AAROM;10 reps;Supine Long Arc Quad: AROM;Left;5 reps;Seated Knee Flexion: AAROM;Left;10 reps;Supine Goniometric ROM: 5-50* AAROM L knee    General Comments        Pertinent Vitals/Pain Pain Score: 8  Pain Location: Lt knee Pain Descriptors / Indicators: Aching;Discomfort;Operative site guarding Pain Intervention(s): Limited activity within patient's tolerance;Monitored during session;Premedicated before session;Ice applied    Home Living  Prior Function            PT Goals (current goals can now be found in the care plan section) Acute Rehab PT Goals Patient Stated Goal: regain independence and stay active PT Goal  Formulation: With patient Time For Goal Achievement: 07/20/21 Potential to Achieve Goals: Good Progress towards PT goals: Progressing toward goals    Frequency    7X/week      PT Plan Current plan remains appropriate    Co-evaluation              AM-PAC PT "6 Clicks" Mobility   Outcome Measure  Help needed turning from your back to your side while in a flat bed without using bedrails?: A Little Help needed moving from lying on your back to sitting on the side of a flat bed without using bedrails?: A Little Help needed moving to and from a bed to a chair (including a wheelchair)?: A Little Help needed standing up from a chair using your arms (e.g., wheelchair or bedside chair)?: A Little Help needed to walk in hospital room?: A Little Help needed climbing 3-5 steps with a railing? : A Little 6 Click Score: 18    End of Session Equipment Utilized During Treatment: Gait belt Activity Tolerance: Patient tolerated treatment well Patient left: in chair;with call bell/phone within reach;with chair alarm set Nurse Communication: Mobility status PT Visit Diagnosis: Muscle weakness (generalized) (M62.81);Difficulty in walking, not elsewhere classified (R26.2);Pain Pain - Right/Left: Left Pain - part of body: Knee     Time: 6568-1275 PT Time Calculation (min) (ACUTE ONLY): 53 min  Charges:  $Gait Training: 23-37 mins $Therapeutic Exercise: 8-22 mins $Therapeutic Activity: 8-22 mins                     Blondell Reveal Kistler PT 07/15/2021  Acute Rehabilitation Services Pager [phone] Office [phone]

## 2021-07-15 NOTE — Progress Notes (Signed)
   Subjective: 2 Days Post-Op Procedure(s) (LRB): TOTAL KNEE ARTHROPLASTY (Left) Patient reports pain as mild.   Patient seen in rounds by Dr. Wynelle Link. Patient is well, and has had no acute complaints or problems. Denies SOB, chest pain, or calf pain. No acute overnight events. Ambulated 60 feet with therapy yesterday. Will continue therapy today.   Plan is to go Home after hospital stay.  Objective: Vital signs in last 24 hours: Temp:  [97.9 F (36.6 C)-98.5 F (36.9 C)] 98.5 F (36.9 C) (11/09 0501) Pulse Rate:  [67-73] 73 (11/09 0501) Resp:  [16-18] 16 (11/09 0501) BP: (124-157)/(49-72) 157/69 (11/09 0501) SpO2:  [94 %-99 %] 99 % (11/09 0501)  Intake/Output from previous day:  Intake/Output Summary (Last 24 hours) at 07/15/2021 0832 Last data filed at 07/15/2021 0501 Gross per 24 hour  Intake 240 ml  Output 1700 ml  Net -1460 ml    Intake/Output this shift: No intake/output data recorded.  Labs: Recent Labs    07/14/21 0315 07/15/21 0329  HGB 11.6* 11.0*   Recent Labs    07/14/21 0315 07/15/21 0329  WBC 8.5 9.5  RBC 3.96 3.75*  HCT 34.5* 32.9*  PLT 174 181   Recent Labs    07/14/21 0315 07/15/21 0329  NA 133* 131*  K 4.0 5.1  CL 102 99  CO2 22 27  BUN 18 16  CREATININE 0.62 0.61  GLUCOSE 120* 119*  CALCIUM 8.6* 8.8*   No results for input(s): LABPT, INR in the last 72 hours.  Exam: General - Patient is Alert and Oriented Extremity - Neurologically intact Neurovascular intact Intact pulses distally Dorsiflexion/Plantar flexion intact Dressing/Incision - clean, dry, no drainage Motor Function - intact, moving foot and toes well on exam.   Past Medical History:  Diagnosis Date   Arthritis    Bladder spasms    "spastic bladder" wears pads   Cancer ([place])    Skin cancer -scalp and eyebrow"basal cell"   Complication of anesthesia    , woke up too early from surgery and pt had floppy fish syndrome   GERD (gastroesophageal reflux disease)     occ. frequent "hiccoughs" after "eating, indigestion"   Hyperlipemia    Hypertension    Hypothyroidism    Macular degeneration    Trigger finger, left    alternate fingers affected. -Occ. shooting pain.   Wears glasses     Assessment/Plan: 2 Days Post-Op Procedure(s) (LRB): TOTAL KNEE ARTHROPLASTY (Left) Active Problems:   Primary osteoarthritis of left knee  Estimated body mass index is 38.59 kg/m as calculated from the following:   Height as of this encounter: 5\' 2"  (1.575 m).   Weight as of this encounter: 95.7 kg. Up with therapy  DVT Prophylaxis - Xarelto and TED hose Weight-bearing as tolerated  Plan for two sessions with PT this morning, and if meeting goals, will plan for discharge this afternoon.   Patient to follow up in two weeks with Dr. Wynelle Link in clinic.   The PDMP database was reviewed today prior to any opioid medications being prescribed to this patient.Fenton Foy, [place], PA-C Orthopedic Surgery [phone] 07/15/2021, 8:32 AM

## 2021-07-15 NOTE — Progress Notes (Signed)
Physical Therapy Treatment Patient Details Name: [name] MRN: [id number] DOB: [birth year] Today's Date: 07/15/2021   History of Present Illness Patient is 80 y.o. female s/p Lt TKA on 07/13/21 with PMH significant for OA, GERD, HTN, HLD, hypothyroidism, macular degeneration, Rt TKA, bil THA.    PT Comments    Reviewed stair training, pt reports she feels comfortable with going up a step. Pt ambulated 20' with RW, distance limited by L knee pain and fatigue. She is ready to DC home from a PT standpoint.     Recommendations for follow up therapy are one component of a multi-disciplinary discharge planning process, led by the attending physician.  Recommendations may be updated based on patient status, additional functional criteria and insurance authorization.  Follow Up Recommendations  Follow physician's recommendations for discharge plan and follow up therapies     Assistance Recommended at Discharge Frequent or constant Supervision/Assistance  Equipment Recommendations  None recommended by PT    Recommendations for Other Services       Precautions / Restrictions Precautions Precautions: Fall;Knee Precaution Booklet Issued: Yes (comment) Precaution Comments: reviewedno pillow under knee Restrictions Weight Bearing Restrictions: No Other Position/Activity Restrictions: WBAT     Mobility  Bed Mobility               General bed mobility comments: up in recliner    Transfers Overall transfer level: Needs assistance Equipment used: Rolling walker (2 wheels) Transfers: Sit to/from Stand Sit to Stand: From elevated surface;Supervision           General transfer comment: no physical assist needed    Ambulation/Gait Ambulation/Gait assistance: Min guard Gait Distance (Feet): 70 Feet Assistive device: Rolling walker (2 wheels) Gait Pattern/deviations: Step-to pattern;Decreased stride length;Decreased weight shift to left;Antalgic Gait velocity: decr      General Gait Details: steady, distance limited by pain/fatigue   Stairs Stairs: Yes Stairs assistance: Min assist Stair Management: No rails;Backwards;With walker;Step to pattern Number of Stairs: 1 General stair comments: VCs for sequencing, min A to steady RW   Wheelchair Mobility    Modified Rankin (Stroke Patients Only)       Balance Overall balance assessment: Needs assistance Sitting-balance support: Feet supported Sitting balance-Leahy Scale: Good     Standing balance support: Reliant on assistive device for balance;During functional activity;Bilateral upper extremity supported Standing balance-Leahy Scale: Poor                              Cognition Arousal/Alertness: Awake/alert Behavior During Therapy: WFL for tasks assessed/performed Overall Cognitive Status: Within Functional Limits for tasks assessed                                          Exercises      General Comments        Pertinent Vitals/Pain Pain Score: 6  Pain Location: Lt knee Pain Descriptors / Indicators: Aching;Discomfort;Operative site guarding Pain Intervention(s): Limited activity within patient's tolerance;Monitored during session;RN gave pain meds during session;Ice applied;Repositioned    Home Living                          Prior Function            PT Goals (current goals can now be found in the care plan section) Acute Rehab PT Goals  Patient Stated Goal: regain independence and stay active PT Goal Formulation: With patient Time For Goal Achievement: 07/20/21 Potential to Achieve Goals: Good Progress towards PT goals: Progressing toward goals    Frequency    7X/week      PT Plan Current plan remains appropriate    Co-evaluation              AM-PAC PT "6 Clicks" Mobility   Outcome Measure  Help needed turning from your back to your side while in a flat bed without using bedrails?: A Little Help needed moving  from lying on your back to sitting on the side of a flat bed without using bedrails?: A Little Help needed moving to and from a bed to a chair (including a wheelchair)?: A Little Help needed standing up from a chair using your arms (e.g., wheelchair or bedside chair)?: A Little Help needed to walk in hospital room?: A Little Help needed climbing 3-5 steps with a railing? : A Little 6 Click Score: 18    End of Session Equipment Utilized During Treatment: Gait belt Activity Tolerance: Patient tolerated treatment well Patient left: in chair;with call bell/phone within reach;with chair alarm set Nurse Communication: Mobility status PT Visit Diagnosis: Muscle weakness (generalized) (M62.81);Difficulty in walking, not elsewhere classified (R26.2);Pain Pain - Right/Left: Left Pain - part of body: Knee     Time: 5003-7048 PT Time Calculation (min) (ACUTE ONLY): 35 min  Charges:  $Gait Training: 8-22 mins $Therapeutic Exercise: 8-22 mins                    Blondell Reveal Kistler PT 07/15/2021  Acute Rehabilitation Services Pager [phone] Office [phone]

## 2021-07-15 NOTE — Progress Notes (Signed)
Discharge package printed and instructions given to patient. Verbalizes understanding.  

## 2021-07-15 NOTE — Plan of Care (Signed)
  Problem: Activity: Goal: Ability to avoid complications of mobility impairment will improve Outcome: Progressing Goal: Range of joint motion will improve Outcome: Progressing   Problem: Pain Management: Goal: Pain level will decrease with appropriate interventions Outcome: Progressing   

## 2021-07-16 ENCOUNTER — Other Ambulatory Visit (HOSPITAL_COMMUNITY): Payer: Self-pay | Admitting: Orthopedic Surgery

## 2021-07-16 ENCOUNTER — Other Ambulatory Visit: Payer: Self-pay | Admitting: Orthopedic Surgery

## 2021-07-16 DIAGNOSIS — Z471 Aftercare following joint replacement surgery: Secondary | ICD-10-CM | POA: Diagnosis not present

## 2021-07-16 DIAGNOSIS — M6281 Muscle weakness (generalized): Secondary | ICD-10-CM | POA: Diagnosis not present

## 2021-07-16 DIAGNOSIS — M79605 Pain in left leg: Secondary | ICD-10-CM

## 2021-07-16 DIAGNOSIS — M25562 Pain in left knee: Secondary | ICD-10-CM | POA: Diagnosis not present

## 2021-07-17 ENCOUNTER — Ambulatory Visit (HOSPITAL_COMMUNITY)
Admission: RE | Admit: 2021-07-17 | Discharge: 2021-07-17 | Disposition: A | Discharge status: Home / Self Care | Payer: Medicare Other | Source: Ambulatory Visit | Attending: Orthopedic Surgery | Admitting: Orthopedic Surgery

## 2021-07-17 ENCOUNTER — Other Ambulatory Visit: Payer: Self-pay

## 2021-07-17 DIAGNOSIS — M25562 Pain in left knee: Secondary | ICD-10-CM | POA: Diagnosis not present

## 2021-07-17 DIAGNOSIS — M6281 Muscle weakness (generalized): Secondary | ICD-10-CM | POA: Diagnosis not present

## 2021-07-17 DIAGNOSIS — M7989 Other specified soft tissue disorders: Secondary | ICD-10-CM | POA: Diagnosis not present

## 2021-07-17 DIAGNOSIS — M79605 Pain in left leg: Secondary | ICD-10-CM | POA: Diagnosis not present

## 2021-07-17 DIAGNOSIS — R600 Localized edema: Secondary | ICD-10-CM | POA: Diagnosis not present

## 2021-07-17 DIAGNOSIS — Z471 Aftercare following joint replacement surgery: Secondary | ICD-10-CM | POA: Diagnosis not present

## 2021-07-20 DIAGNOSIS — M6281 Muscle weakness (generalized): Secondary | ICD-10-CM | POA: Diagnosis not present

## 2021-07-20 DIAGNOSIS — Z471 Aftercare following joint replacement surgery: Secondary | ICD-10-CM | POA: Diagnosis not present

## 2021-07-20 DIAGNOSIS — M25562 Pain in left knee: Secondary | ICD-10-CM | POA: Diagnosis not present

## 2021-07-20 NOTE — Discharge Summary (Signed)
Physician Discharge Summary   Patient ID: [name] MRN: [id number] DOB/AGE: 79-Jun-1942 79 y.o.  Admit date: 07/13/2021 Discharge date: 07/15/2021  Primary Diagnosis: Osteoarthritis, left knee   Admission Diagnoses:  Past Medical History:  Diagnosis Date   Arthritis    Bladder spasms    "spastic bladder" wears pads   Cancer ([place])    Skin cancer -scalp and eyebrow"basal cell"   Complication of anesthesia    , woke up too early from surgery and pt had floppy fish syndrome   GERD (gastroesophageal reflux disease)    occ. frequent "hiccoughs" after "eating, indigestion"   Hyperlipemia    Hypertension    Hypothyroidism    Macular degeneration    Trigger finger, left    alternate fingers affected. -Occ. shooting pain.   Wears glasses    Discharge Diagnoses:   Active Problems:   Primary osteoarthritis of left knee  Estimated body mass index is 38.59 kg/m as calculated from the following:   Height as of this encounter: 5\' 2"  (1.575 m).   Weight as of this encounter: 95.7 kg.  Procedure:  Procedure(s) (LRB): TOTAL KNEE ARTHROPLASTY (Left)   Consults: None  HPI: [name] is a 80 y.o. year old female with end stage OA of her left knee with progressively worsening pain and dysfunction. She has constant pain, with activity and at rest and significant functional deficits with difficulties even with ADLs. She has had extensive non-op management including analgesics, injections of cortisone and viscosupplements, and home exercise program, but remains in significant pain with significant dysfunction. Radiographs show bone on bone arthritis lateral and patellofemoral with valgus deformity. She presents now for left Total Knee Arthroplasty.     Laboratory Data: Admission on 07/13/2021, Discharged on 07/15/2021  Component Date Value Ref Range Status   WBC 07/14/2021 8.5  4.0 - 10.5 K/uL Final   RBC 07/14/2021 3.96  3.87 - 5.11 MIL/uL Final   Hemoglobin 07/14/2021 11.6 (A)   12.0 - 15.0 g/dL Final   HCT 07/14/2021 34.5 (A)  36.0 - 46.0 % Final   MCV 07/14/2021 87.1  80.0 - 100.0 fL Final   MCH 07/14/2021 29.3  26.0 - 34.0 pg Final   MCHC 07/14/2021 33.6  30.0 - 36.0 g/dL Final   RDW 07/14/2021 13.6  11.5 - 15.5 % Final   Platelets 07/14/2021 174  150 - 400 K/uL Final   nRBC 07/14/2021 0.0  0.0 - 0.2 % Final   Performed at [facility], [place] [address] [place], [place] [address]   Sodium 07/14/2021 133 (A)  135 - 145 mmol/L Final   Potassium 07/14/2021 4.0  3.5 - 5.1 mmol/L Final   Chloride 07/14/2021 102  98 - 111 mmol/L Final   CO2 07/14/2021 22  22 - 32 mmol/L Final   Glucose, Bld 07/14/2021 120 (A)  70 - 99 mg/dL Final   Glucose reference range applies only to samples taken after fasting for at least 8 hours.   BUN 07/14/2021 18  8 - 23 mg/dL Final   Creatinine, Ser 07/14/2021 0.62  0.44 - 1.00 mg/dL Final   Calcium 07/14/2021 8.6 (A)  8.9 - 10.3 mg/dL Final   GFR, Estimated 07/14/2021 >60  >60 mL/min Final   Comment: (NOTE) Calculated using the CKD-EPI Creatinine Equation (2021)    Anion gap 07/14/2021 9  5 - 15 Final   Performed at [facility], [place] [address], [place], [place] [address]   WBC 07/15/2021 9.5  4.0 - 10.5 K/uL Final   RBC 07/15/2021 3.75 (A)  3.87 - 5.11 MIL/uL Final   Hemoglobin 07/15/2021 11.0 (A)  12.0 - 15.0 g/dL Final   HCT 07/15/2021 32.9 (A)  36.0 - 46.0 % Final   MCV 07/15/2021 87.7  80.0 - 100.0 fL Final   MCH 07/15/2021 29.3  26.0 - 34.0 pg Final   MCHC 07/15/2021 33.4  30.0 - 36.0 g/dL Final   RDW 07/15/2021 14.2  11.5 - 15.5 % Final   Platelets 07/15/2021 181  150 - 400 K/uL Final   nRBC 07/15/2021 0.0  0.0 - 0.2 % Final   Performed at [facility], [place] [address], [place], [place] [address]   Sodium 07/15/2021 131 (A)  135 - 145 mmol/L Final   Potassium 07/15/2021 5.1  3.5 - 5.1 mmol/L Final   Comment: DELTA CHECK NOTED NO VISIBLE HEMOLYSIS    Chloride  07/15/2021 99  98 - 111 mmol/L Final   CO2 07/15/2021 27  22 - 32 mmol/L Final   Glucose, Bld 07/15/2021 119 (A)  70 - 99 mg/dL Final   Glucose reference range applies only to samples taken after fasting for at least 8 hours.   BUN 07/15/2021 16  8 - 23 mg/dL Final   Creatinine, Ser 07/15/2021 0.61  0.44 - 1.00 mg/dL Final   Calcium 07/15/2021 8.8 (A)  8.9 - 10.3 mg/dL Final   GFR, Estimated 07/15/2021 >60  >60 mL/min Final   Comment: (NOTE) Calculated using the CKD-EPI Creatinine Equation (2021)    Anion gap 07/15/2021 5  5 - 15 Final   Performed at [facility], [place] [address], [place], [place] [address]  Orders Only on 07/09/2021  Component Date Value Ref Range Status   SARS Coronavirus 2 07/09/2021 RESULT: NEGATIVE   Final   Comment: RESULT: NEGATIVESARS-CoV-2 INTERPRETATION:A NEGATIVE  test result means that SARS-CoV-2 RNA was not present in the specimen above the limit of detection of this test. This does not preclude a possible SARS-CoV-2 infection and should not be used as the  sole basis for patient management decisions. Negative results must be combined with clinical observations, patient history, and epidemiological information. Optimum specimen types and timing for peak viral levels during infections caused by SARS-CoV-2  have not been determined. Collection of multiple specimens or types of specimens may be necessary to detect virus. Improper specimen collection and handling, sequence variability under primers/probes, or organism present below the limit of detection may  lead to false negative results. Positive and negative predictive values of testing are highly dependent on prevalence. False negative test results are more likely when prevalence of disease is high.The expected result is NEGATIVE.Fact S                          heet for  Healthcare Providers: LocalChronicle.no Sheet for Patients:  SalonLookup.es Reference Range - Negative   Hospital Outpatient Visit on 07/02/2021  Component Date Value Ref Range Status   MRSA, PCR 07/02/2021 NEGATIVE  NEGATIVE Final   Staphylococcus aureus 07/02/2021 NEGATIVE  NEGATIVE Final   Comment: (NOTE) The Xpert SA Assay (FDA approved for NASAL specimens in patients 71 years of age and older), is one component of a comprehensive surveillance program. It is not intended to diagnose infection nor to guide or monitor treatment. Performed at [facility], [place] [address] [place], [place] [address]    WBC 07/02/2021 6.5  4.0 -  10.5 K/uL Final   RBC 07/02/2021 4.60  3.87 - 5.11 MIL/uL Final   Hemoglobin 07/02/2021 13.2  12.0 - 15.0 g/dL Final   HCT 07/02/2021 41.3  36.0 - 46.0 % Final   MCV 07/02/2021 89.8  80.0 - 100.0 fL Final   MCH 07/02/2021 28.7  26.0 - 34.0 pg Final   MCHC 07/02/2021 32.0  30.0 - 36.0 g/dL Final   RDW 07/02/2021 14.0  11.5 - 15.5 % Final   Platelets 07/02/2021 211  150 - 400 K/uL Final   nRBC 07/02/2021 0.0  0.0 - 0.2 % Final   Performed at [facility], [place] [address], [place], [place] [address]   Sodium 07/02/2021 138  135 - 145 mmol/L Final   Potassium 07/02/2021 4.1  3.5 - 5.1 mmol/L Final   Chloride 07/02/2021 107  98 - 111 mmol/L Final   CO2 07/02/2021 23  22 - 32 mmol/L Final   Glucose, Bld 07/02/2021 86  70 - 99 mg/dL Final   Glucose reference range applies only to samples taken after fasting for at least 8 hours.   BUN 07/02/2021 19  8 - 23 mg/dL Final   Creatinine, Ser 07/02/2021 0.74  0.44 - 1.00 mg/dL Final   Calcium 07/02/2021 9.5  8.9 - 10.3 mg/dL Final   Total Protein 07/02/2021 6.6  6.5 - 8.1 g/dL Final   Albumin 07/02/2021 3.7  3.5 - 5.0 g/dL Final   AST 07/02/2021 19  15 - 41 U/L Final   ALT 07/02/2021 14  0 - 44 U/L Final   Alkaline Phosphatase 07/02/2021 77  38 - 126 U/L Final   Total Bilirubin 07/02/2021 0.5  0.3 - 1.2 mg/dL  Final   GFR, Estimated 07/02/2021 >60  >60 mL/min Final   Comment: (NOTE) Calculated using the CKD-EPI Creatinine Equation (2021)    Anion gap 07/02/2021 8  5 - 15 Final   Performed at [facility], [place] [address], [place], [place] [address]   ABO/RH(D) 07/02/2021 O NEG   Final   Antibody Screen 07/02/2021 NEG   Final   Sample Expiration 07/02/2021 07/16/2021,2359   Final   Extend sample reason 07/02/2021    Final                   Value:NO TRANSFUSIONS OR PREGNANCY IN THE PAST 3 MONTHS Performed at [place] [address], [place], [place] [address]    Prothrombin Time 07/02/2021 12.8  11.4 - 15.2 seconds Final   INR 07/02/2021 1.0  0.8 - 1.2 Final   Comment: (NOTE) INR goal varies based on device and disease states. Performed at [facility], [place] [address], [place], [place] [address]      X-Rays:US Venous Img Lower Unilateral Left (DVT)  Result Date: 07/17/2021 CLINICAL DATA:  Left lower extremity pain and swelling EXAM: LEFT LOWER EXTREMITY VENOUS DOPPLER ULTRASOUND TECHNIQUE: Gray-scale sonography with compression, as well as color and duplex ultrasound, were performed to evaluate the deep venous system(s) from the level of the common femoral vein through the popliteal and proximal calf veins. COMPARISON:  None. FINDINGS: VENOUS Normal compressibility of the common femoral, superficial femoral, and popliteal veins, as well as the visualized calf veins. Visualized portions of profunda femoral vein and great saphenous vein unremarkable. No filling defects to suggest DVT on grayscale or color Doppler imaging. Doppler waveforms show normal direction of venous flow, normal respiratory plasticity and response to augmentation. Limited views of the  contralateral common femoral vein are unremarkable. OTHER Superficial subcutaneous edema along the calf. Limitations: none IMPRESSION: 1. No evidence of acute DVT. 2. Superficial  subcutaneous edema is noted. Electronically Signed   By: Jacqulynn Cadet M.D.   On: 07/17/2021 10:48    EKG: Orders placed or performed in visit on 06/30/21   EKG 12-Lead     Hospital Course: [name] is a 80 y.o. who was admitted to [facility]. They were brought to the operating room on 07/13/2021 and underwent Procedure(s): TOTAL KNEE ARTHROPLASTY.  Patient tolerated the procedure well and was later transferred to the recovery room and then to the orthopaedic floor for postoperative care. They were given PO and IV analgesics for pain control following their surgery. They were given 24 hours of postoperative antibiotics of  Anti-infectives (From admission, onward)    Start     Dose/Rate Route Frequency Ordered Stop   07/13/21 1600  ceFAZolin (ANCEF) IVPB 2g/100 mL premix        2 g 200 mL/hr over 30 Minutes Intravenous Every 6 hours 07/13/21 1358 07/13/21 2217   07/13/21 0715  ceFAZolin (ANCEF) IVPB 2g/100 mL premix        2 g 200 mL/hr over 30 Minutes Intravenous On call to O.R. 07/13/21 0700 07/13/21 0945      and started on DVT prophylaxis in the form of Xarelto.   PT and OT were ordered for total joint protocol. Discharge planning consulted to help with postop disposition and equipment needs. Patient had a good night on the evening of surgery. They started to get up OOB with therapy on POD #0. Continued to work with therapy into POD #2. Pt was seen during rounds on day two and was ready to go home pending progress with therapy. Dressing was changed and the incision was clean, dry, and intact. Pt worked with therapy for two additional sessions and was meeting their goals. She was discharged to home later that day in stable condition.  Diet: Regular diet Activity: WBAT Follow-up: in 2 weeks Disposition: Home with OPPT Discharged Condition: stable   Discharge Instructions     Call MD / Call 911   Complete by: As directed    If you experience chest pain or shortness  of breath, CALL 911 and be transported to the hospital emergency room.  If you develope a fever above 101 F, pus (white drainage) or increased drainage or redness at the wound, or calf pain, call your surgeon's office.   Call MD / Call 911   Complete by: As directed    If you experience chest pain or shortness of breath, CALL 911 and be transported to the hospital emergency room.  If you develope a fever above 101 F, pus (white drainage) or increased drainage or redness at the wound, or calf pain, call your surgeon's office.   Change dressing   Complete by: As directed    You may remove the bulky bandage (ACE wrap and gauze) two days after surgery. You will have an adhesive waterproof bandage underneath. Leave this in place until your first follow-up appointment.   Change dressing   Complete by: As directed    You may remove the bulky bandage (ACE wrap and gauze) two days after surgery. You will have an adhesive waterproof bandage underneath. Leave this in place until your first follow-up appointment.   Constipation Prevention   Complete by: As directed    Drink plenty of fluids.  Prune juice may  be helpful.  You may use a stool softener, such as Colace (over the counter) 100 mg twice a day.  Use MiraLax (over the counter) for constipation as needed.   Constipation Prevention   Complete by: As directed    Drink plenty of fluids.  Prune juice may be helpful.  You may use a stool softener, such as Colace (over the counter) 100 mg twice a day.  Use MiraLax (over the counter) for constipation as needed.   Diet - low sodium heart healthy   Complete by: As directed    Diet - low sodium heart healthy   Complete by: As directed    Do not put a pillow under the knee. Place it under the heel.   Complete by: As directed    Do not put a pillow under the knee. Place it under the heel.   Complete by: As directed    Driving restrictions   Complete by: As directed    No driving for two weeks   Driving  restrictions   Complete by: As directed    No driving for two weeks   Post-operative opioid taper instructions:   Complete by: As directed    POST-OPERATIVE OPIOID TAPER INSTRUCTIONS: It is important to wean off of your opioid medication as soon as possible. If you do not need pain medication after your surgery it is ok to stop day one. Opioids include: Codeine, Hydrocodone(Norco, Vicodin), Oxycodone(Percocet, oxycontin) and hydromorphone amongst others.  Long term and even short term use of opiods can cause: Increased pain response Dependence Constipation Depression Respiratory depression And more.  Withdrawal symptoms can include Flu like symptoms Nausea, vomiting And more Techniques to manage these symptoms Hydrate well Eat regular healthy meals Stay active Use relaxation techniques(deep breathing, meditating, yoga) Do Not substitute Alcohol to help with tapering If you have been on opioids for less than two weeks and do not have pain than it is ok to stop all together.  Plan to wean off of opioids This plan should start within one week post op of your joint replacement. Maintain the same interval or time between taking each dose and first decrease the dose.  Cut the total daily intake of opioids by one tablet each day Next start to increase the time between doses. The last dose that should be eliminated is the evening dose.      Post-operative opioid taper instructions:   Complete by: As directed    POST-OPERATIVE OPIOID TAPER INSTRUCTIONS: It is important to wean off of your opioid medication as soon as possible. If you do not need pain medication after your surgery it is ok to stop day one. Opioids include: Codeine, Hydrocodone(Norco, Vicodin), Oxycodone(Percocet, oxycontin) and hydromorphone amongst others.  Long term and even short term use of opiods can cause: Increased pain response Dependence Constipation Depression Respiratory depression And more.   Withdrawal symptoms can include Flu like symptoms Nausea, vomiting And more Techniques to manage these symptoms Hydrate well Eat regular healthy meals Stay active Use relaxation techniques(deep breathing, meditating, yoga) Do Not substitute Alcohol to help with tapering If you have been on opioids for less than two weeks and do not have pain than it is ok to stop all together.  Plan to wean off of opioids This plan should start within one week post op of your joint replacement. Maintain the same interval or time between taking each dose and first decrease the dose.  Cut the total daily intake of opioids by one  tablet each day Next start to increase the time between doses. The last dose that should be eliminated is the evening dose.      TED hose   Complete by: As directed    Use stockings (TED hose) for three weeks on both leg(s).  You may remove them at night for sleeping.   TED hose   Complete by: As directed    Use stockings (TED hose) for three weeks on both leg(s).  You may remove them at night for sleeping.   Weight bearing as tolerated   Complete by: As directed    Weight bearing as tolerated   Complete by: As directed       Allergies as of 07/15/2021       Reactions   Feldene [piroxicam] Other (See Comments)   CANT REMEMBER   Gabapentin Other (See Comments)   Gi upset   Ivp Dye [iodinated Diagnostic Agents] Other (See Comments)   CANT REMEMBER   Latex Other (See Comments)   Tickle nose   Sulfa Antibiotics    CANT REMEMBER   Naproxen Nausea And Vomiting   Vicodin [hydrocodone-acetaminophen] Nausea And Vomiting        Medication List     STOP taking these medications    CITRACAL PO   PRESERVISION AREDS 2 PO   Vitamin D 50 MCG (2000 UT) Caps       TAKE these medications    anastrozole 1 MG tablet Commonly known as: ARIMIDEX Take 1 mg by mouth daily.   hydrochlorothiazide 12.5 MG capsule Commonly known as: MICROZIDE TAKE 1 CAPSULE BY MOUTH  EVERY DAY   levothyroxine 125 MCG tablet Commonly known as: SYNTHROID Take 125 mcg by mouth daily before breakfast.   losartan 50 MG tablet Commonly known as: COZAAR Take 1 tablet (50 mg total) by mouth daily.   methocarbamol 500 MG tablet Commonly known as: ROBAXIN Take 1 tablet (500 mg total) by mouth every 6 (six) hours as needed for muscle spasms.   metoprolol tartrate 50 MG tablet Commonly known as: LOPRESSOR Take 50 mg by mouth 2 (two) times daily.   oxyCODONE 5 MG immediate release tablet Commonly known as: Oxy IR/ROXICODONE Take 1-2 tablets (5-10 mg total) by mouth every 6 (six) hours as needed for severe pain.   rivaroxaban 10 MG Tabs tablet Commonly known as: XARELTO Take 1 tablet (10 mg total) by mouth daily with breakfast for 20 days. Then take one 81 mg aspirin once a day for three weeks. Then discontinue aspirin.   simvastatin 10 MG tablet Commonly known as: ZOCOR Take 10 mg by mouth at bedtime.   Systane 0.4-0.3 % Soln Generic drug: Polyethyl Glycol-Propyl Glycol Place 1 application into both eyes daily as needed (Dry eye).   tolterodine 4 MG 24 hr capsule Commonly known as: DETROL LA TAKE ONE CAPSULE BY MOUTH DAILY   traMADol 50 MG tablet Commonly known as: ULTRAM Take 1-2 tablets (50-100 mg total) by mouth every 6 (six) hours as needed for moderate pain.   vitamin A & D ointment Apply 1 application topically daily as needed ([place] (Skin Chafing)).               Discharge Care Instructions  (From admission, onward)           Start     Ordered   07/15/21 0000  Weight bearing as tolerated        07/15/21 0835   07/15/21 0000  Change dressing  Comments: You may remove the bulky bandage (ACE wrap and gauze) two days after surgery. You will have an adhesive waterproof bandage underneath. Leave this in place until your first follow-up appointment.   07/15/21 0835   07/14/21 0000  Weight bearing as tolerated        07/14/21 0731    07/14/21 0000  Change dressing       Comments: You may remove the bulky bandage (ACE wrap and gauze) two days after surgery. You will have an adhesive waterproof bandage underneath. Leave this in place until your first follow-up appointment.   07/14/21 0731            Follow-up Information     Jaydn Moscato, [place], PA. Go on 07/28/2021.   Specialty: Orthopedic Surgery Why: You are scheduled for a follow up appointment on 07-28-21 at 1:30 pm. Contact information: [address] [place] [id number] [phone]                 Signed: Theresa Duty, PA-C Orthopedic Surgery 07/20/2021, 8:17 AM

## 2021-07-23 DIAGNOSIS — M6281 Muscle weakness (generalized): Secondary | ICD-10-CM | POA: Diagnosis not present

## 2021-07-23 DIAGNOSIS — M25562 Pain in left knee: Secondary | ICD-10-CM | POA: Diagnosis not present

## 2021-07-23 DIAGNOSIS — Z471 Aftercare following joint replacement surgery: Secondary | ICD-10-CM | POA: Diagnosis not present

## 2021-07-27 DIAGNOSIS — Z471 Aftercare following joint replacement surgery: Secondary | ICD-10-CM | POA: Diagnosis not present

## 2021-07-27 DIAGNOSIS — M25562 Pain in left knee: Secondary | ICD-10-CM | POA: Diagnosis not present

## 2021-07-27 DIAGNOSIS — M6281 Muscle weakness (generalized): Secondary | ICD-10-CM | POA: Diagnosis not present

## 2021-07-29 DIAGNOSIS — Z471 Aftercare following joint replacement surgery: Secondary | ICD-10-CM | POA: Diagnosis not present

## 2021-07-29 DIAGNOSIS — M6281 Muscle weakness (generalized): Secondary | ICD-10-CM | POA: Diagnosis not present

## 2021-07-29 DIAGNOSIS — M25562 Pain in left knee: Secondary | ICD-10-CM | POA: Diagnosis not present

## 2021-08-03 DIAGNOSIS — M6281 Muscle weakness (generalized): Secondary | ICD-10-CM | POA: Diagnosis not present

## 2021-08-03 DIAGNOSIS — M25562 Pain in left knee: Secondary | ICD-10-CM | POA: Diagnosis not present

## 2021-08-03 DIAGNOSIS — Z471 Aftercare following joint replacement surgery: Secondary | ICD-10-CM | POA: Diagnosis not present

## 2021-08-03 NOTE — Progress Notes (Signed)
History of Present Illness: 80 yo female presents for f/u of OAB sx's.  She is on tolterodine.  Despite this, she still has nocturia x4-5.  Daytime frequency and urgency and occasional urgency incontinence.  No urinary tract infections over the past year.  3 weeks ago had left total knee arthroplasty.  Recovering well from that.    Past Medical History:  Diagnosis Date   Arthritis    Bladder spasms    "spastic bladder" wears pads   Cancer ([place])    Skin cancer -scalp and eyebrow"basal cell"   Complication of anesthesia    , woke up too early from surgery and pt had floppy fish syndrome   GERD (gastroesophageal reflux disease)    occ. frequent "hiccoughs" after "eating, indigestion"   Hyperlipemia    Hypertension    Hypothyroidism    Macular degeneration    Trigger finger, left    alternate fingers affected. -Occ. shooting pain.   Wears glasses     Past Surgical History:  Procedure Laterality Date   ABDOMINAL HYSTERECTOMY  09/07/1987   APPENDECTOMY     CARDIAC CATHETERIZATION  09/06/2004   normal-done for work up pre hip surgeries   CARPAL TUNNEL RELEASE Left    CHOLECYSTECTOMY  09/07/1995   JOINT REPLACEMENT     BTHA   left breast lumpectomy      POLYPECTOMY Bilateral 06/26/2013   Procedure: BILATERAL EXCISION OF NASAL POLYPS ;  Surgeon: Ascencion Dike, MD;  Location: [facility];  Service: ENT;  Laterality: Bilateral;   SEPTOPLASTY N/A 06/26/2013   Procedure: AND SEPTOPLASTY;  Surgeon: Ascencion Dike, MD;  Location: [facility];  Service: ENT;  Laterality: N/A;   THYROIDECTOMY  09/06/1992   TONSILLECTOMY     TOTAL HIP ARTHROPLASTY  09/06/2004   right   TOTAL HIP ARTHROPLASTY  09/06/2004   left   TOTAL KNEE ARTHROPLASTY Right 12/13/2014   Procedure: RIGHT TOTAL KNEE ARTHROPLASTY;  Surgeon: Gaynelle Arabian, MD;  Location: [facility];  Service: Orthopedics;  Laterality: Right;   TOTAL KNEE ARTHROPLASTY Left 07/13/2021   Procedure: TOTAL KNEE ARTHROPLASTY;   Surgeon: Gaynelle Arabian, MD;  Location: [facility];  Service: Orthopedics;  Laterality: Left;    Home Medications:  Allergies as of 08/04/2021       Reactions   Feldene [piroxicam] Other (See Comments)   CANT REMEMBER   Gabapentin Other (See Comments)   Gi upset   Ivp Dye [iodinated Diagnostic Agents] Other (See Comments)   CANT REMEMBER   Latex Other (See Comments)   Tickle nose   Sulfa Antibiotics    CANT REMEMBER   Naproxen Nausea And Vomiting   Vicodin [hydrocodone-acetaminophen] Nausea And Vomiting        Medication List        Accurate as of August 03, 2021  8:38 PM. If you have any questions, ask your nurse or doctor.          anastrozole 1 MG tablet Commonly known as: ARIMIDEX Take 1 mg by mouth daily.   hydrochlorothiazide 12.5 MG capsule Commonly known as: MICROZIDE TAKE 1 CAPSULE BY MOUTH EVERY DAY   levothyroxine 125 MCG tablet Commonly known as: SYNTHROID Take 125 mcg by mouth daily before breakfast.   losartan 50 MG tablet Commonly known as: COZAAR Take 1 tablet (50 mg total) by mouth daily.   methocarbamol 500 MG tablet Commonly known as: ROBAXIN Take 1 tablet (500 mg total) by mouth every 6 (six) hours as needed  for muscle spasms.   metoprolol tartrate 50 MG tablet Commonly known as: LOPRESSOR Take 50 mg by mouth 2 (two) times daily.   oxyCODONE 5 MG immediate release tablet Commonly known as: Oxy IR/ROXICODONE Take 1-2 tablets (5-10 mg total) by mouth every 6 (six) hours as needed for severe pain.   simvastatin 10 MG tablet Commonly known as: ZOCOR Take 10 mg by mouth at bedtime.   Systane 0.4-0.3 % Soln Generic drug: Polyethyl Glycol-Propyl Glycol Place 1 application into both eyes daily as needed (Dry eye).   tolterodine 4 MG 24 hr capsule Commonly known as: DETROL LA TAKE ONE CAPSULE BY MOUTH DAILY   traMADol 50 MG tablet Commonly known as: ULTRAM Take 1-2 tablets (50-100 mg total) by mouth every 6 (six) hours as needed for  moderate pain.   vitamin A & D ointment Apply 1 application topically daily as needed (Gaul[place] (Skin Chafing)).        Allergies:  Allergies  Allergen Reactions   Feldene [Piroxicam] Other (See Comments)    CANT REMEMBER   Gabapentin Other (See Comments)    Gi upset   Ivp Dye [Iodinated Diagnostic Agents] Other (See Comments)    CANT REMEMBER   Latex Other (See Comments)    Tickle nose   Sulfa Antibiotics     CANT REMEMBER   Naproxen Nausea And Vomiting   Vicodin [Hydrocodone-Acetaminophen] Nausea And Vomiting    No family history on file.  Social History:  reports that she has never smoked. She has never used smokeless tobacco. She reports that she does not drink alcohol and does not use drugs.  ROS: A complete review of systems was performed.  All systems are negative except for pertinent findings as noted.  Physical Exam:  Vital signs in last 24 hours: There were no vitals taken for this visit. Constitutional:  Alert and oriented, No acute distress Cardiovascular: Regular rate  Respiratory: Normal respiratory effort Neurologic: Grossly intact, no focal deficits Psychiatric: Normal mood and affect  I have reviewed prior pt notes  I have reviewed notes from referring/previous physicians  I have reviewed urinalysis results     Impression/Assessment:  OAB with occasional urgency incontinence, urinary frequency, on tolterodine  Plan:  She will continue tolterodine  I have given her overactive bladder guidelines/suggestions  I will see back in a year

## 2021-08-04 ENCOUNTER — Other Ambulatory Visit: Payer: Self-pay

## 2021-08-04 ENCOUNTER — Ambulatory Visit (INDEPENDENT_AMBULATORY_CARE_PROVIDER_SITE_OTHER): Payer: Medicare Other | Admitting: Urology

## 2021-08-04 ENCOUNTER — Encounter: Payer: Self-pay | Admitting: Urology

## 2021-08-04 VITALS — BP 107/71 | HR 81

## 2021-08-04 DIAGNOSIS — N3281 Overactive bladder: Secondary | ICD-10-CM | POA: Diagnosis not present

## 2021-08-04 NOTE — Progress Notes (Signed)
Urological Symptom Review  Patient is experiencing the following symptoms: Frequent urination Hard to postpone urination Get up at night to urinate   Review of Systems  Gastrointestinal (upper)  : Negative for upper GI symptoms  Gastrointestinal (lower) : Negative for lower GI symptoms  Constitutional : Negative for symptoms  Skin: Negative for skin symptoms  Eyes: Negative for eye symptoms  Ear/Nose/Throat : Negative for Ear/Nose/Throat symptoms  Hematologic/Lymphatic: Negative for Hematologic/Lymphatic symptoms  Cardiovascular : Negative for cardiovascular symptoms  Respiratory : Negative for respiratory symptoms  Endocrine: Negative for endocrine symptoms  Musculoskeletal: Recent knee replacement  Neurological: Negative for neurological symptoms  Psychologic: Negative for psychiatric symptoms

## 2021-08-06 DIAGNOSIS — Z471 Aftercare following joint replacement surgery: Secondary | ICD-10-CM | POA: Diagnosis not present

## 2021-08-06 DIAGNOSIS — M25562 Pain in left knee: Secondary | ICD-10-CM | POA: Diagnosis not present

## 2021-08-06 DIAGNOSIS — M6281 Muscle weakness (generalized): Secondary | ICD-10-CM | POA: Diagnosis not present

## 2021-08-11 DIAGNOSIS — Z471 Aftercare following joint replacement surgery: Secondary | ICD-10-CM | POA: Diagnosis not present

## 2021-08-11 DIAGNOSIS — M25562 Pain in left knee: Secondary | ICD-10-CM | POA: Diagnosis not present

## 2021-08-11 DIAGNOSIS — M6281 Muscle weakness (generalized): Secondary | ICD-10-CM | POA: Diagnosis not present

## 2021-08-13 DIAGNOSIS — M6281 Muscle weakness (generalized): Secondary | ICD-10-CM | POA: Diagnosis not present

## 2021-08-13 DIAGNOSIS — M25562 Pain in left knee: Secondary | ICD-10-CM | POA: Diagnosis not present

## 2021-08-13 DIAGNOSIS — Z471 Aftercare following joint replacement surgery: Secondary | ICD-10-CM | POA: Diagnosis not present

## 2021-08-18 DIAGNOSIS — M6281 Muscle weakness (generalized): Secondary | ICD-10-CM | POA: Diagnosis not present

## 2021-08-18 DIAGNOSIS — M25562 Pain in left knee: Secondary | ICD-10-CM | POA: Diagnosis not present

## 2021-08-18 DIAGNOSIS — Z471 Aftercare following joint replacement surgery: Secondary | ICD-10-CM | POA: Diagnosis not present

## 2021-08-19 DIAGNOSIS — Z96652 Presence of left artificial knee joint: Secondary | ICD-10-CM | POA: Diagnosis not present

## 2021-08-19 DIAGNOSIS — Z471 Aftercare following joint replacement surgery: Secondary | ICD-10-CM | POA: Diagnosis not present

## 2021-08-20 DIAGNOSIS — Z471 Aftercare following joint replacement surgery: Secondary | ICD-10-CM | POA: Diagnosis not present

## 2021-08-20 DIAGNOSIS — M25562 Pain in left knee: Secondary | ICD-10-CM | POA: Diagnosis not present

## 2021-08-20 DIAGNOSIS — M6281 Muscle weakness (generalized): Secondary | ICD-10-CM | POA: Diagnosis not present

## 2021-08-25 DIAGNOSIS — M25562 Pain in left knee: Secondary | ICD-10-CM | POA: Diagnosis not present

## 2021-08-25 DIAGNOSIS — M6281 Muscle weakness (generalized): Secondary | ICD-10-CM | POA: Diagnosis not present

## 2021-08-25 DIAGNOSIS — Z471 Aftercare following joint replacement surgery: Secondary | ICD-10-CM | POA: Diagnosis not present

## 2021-08-27 DIAGNOSIS — M6281 Muscle weakness (generalized): Secondary | ICD-10-CM | POA: Diagnosis not present

## 2021-08-27 DIAGNOSIS — Z471 Aftercare following joint replacement surgery: Secondary | ICD-10-CM | POA: Diagnosis not present

## 2021-08-27 DIAGNOSIS — M25562 Pain in left knee: Secondary | ICD-10-CM | POA: Diagnosis not present

## 2021-09-01 DIAGNOSIS — Z471 Aftercare following joint replacement surgery: Secondary | ICD-10-CM | POA: Diagnosis not present

## 2021-09-01 DIAGNOSIS — M6281 Muscle weakness (generalized): Secondary | ICD-10-CM | POA: Diagnosis not present

## 2021-09-01 DIAGNOSIS — M25562 Pain in left knee: Secondary | ICD-10-CM | POA: Diagnosis not present

## 2021-09-02 DIAGNOSIS — M25562 Pain in left knee: Secondary | ICD-10-CM | POA: Diagnosis not present

## 2021-09-02 DIAGNOSIS — Z471 Aftercare following joint replacement surgery: Secondary | ICD-10-CM | POA: Diagnosis not present

## 2021-09-02 DIAGNOSIS — M6281 Muscle weakness (generalized): Secondary | ICD-10-CM | POA: Diagnosis not present

## 2021-09-03 DIAGNOSIS — Z961 Presence of intraocular lens: Secondary | ICD-10-CM | POA: Diagnosis not present

## 2021-09-03 DIAGNOSIS — H26492 Other secondary cataract, left eye: Secondary | ICD-10-CM | POA: Diagnosis not present

## 2021-09-03 DIAGNOSIS — Z9841 Cataract extraction status, right eye: Secondary | ICD-10-CM | POA: Diagnosis not present

## 2021-09-03 DIAGNOSIS — H353111 Nonexudative age-related macular degeneration, right eye, early dry stage: Secondary | ICD-10-CM | POA: Diagnosis not present

## 2021-09-03 DIAGNOSIS — H524 Presbyopia: Secondary | ICD-10-CM | POA: Diagnosis not present

## 2021-09-03 DIAGNOSIS — H3530 Unspecified macular degeneration: Secondary | ICD-10-CM | POA: Diagnosis not present

## 2021-09-03 DIAGNOSIS — H353121 Nonexudative age-related macular degeneration, left eye, early dry stage: Secondary | ICD-10-CM | POA: Diagnosis not present

## 2021-09-03 DIAGNOSIS — H52223 Regular astigmatism, bilateral: Secondary | ICD-10-CM | POA: Diagnosis not present

## 2021-09-08 ENCOUNTER — Other Ambulatory Visit: Payer: Self-pay | Admitting: Cardiovascular Disease

## 2021-09-08 DIAGNOSIS — M25562 Pain in left knee: Secondary | ICD-10-CM | POA: Diagnosis not present

## 2021-09-08 DIAGNOSIS — M6281 Muscle weakness (generalized): Secondary | ICD-10-CM | POA: Diagnosis not present

## 2021-09-08 DIAGNOSIS — Z471 Aftercare following joint replacement surgery: Secondary | ICD-10-CM | POA: Diagnosis not present

## 2021-09-10 DIAGNOSIS — Z471 Aftercare following joint replacement surgery: Secondary | ICD-10-CM | POA: Diagnosis not present

## 2021-09-10 DIAGNOSIS — M25562 Pain in left knee: Secondary | ICD-10-CM | POA: Diagnosis not present

## 2021-09-10 DIAGNOSIS — M6281 Muscle weakness (generalized): Secondary | ICD-10-CM | POA: Diagnosis not present

## 2021-09-15 DIAGNOSIS — M6281 Muscle weakness (generalized): Secondary | ICD-10-CM | POA: Diagnosis not present

## 2021-09-15 DIAGNOSIS — M25562 Pain in left knee: Secondary | ICD-10-CM | POA: Diagnosis not present

## 2021-09-15 DIAGNOSIS — Z471 Aftercare following joint replacement surgery: Secondary | ICD-10-CM | POA: Diagnosis not present

## 2021-09-17 DIAGNOSIS — M6281 Muscle weakness (generalized): Secondary | ICD-10-CM | POA: Diagnosis not present

## 2021-09-17 DIAGNOSIS — Z471 Aftercare following joint replacement surgery: Secondary | ICD-10-CM | POA: Diagnosis not present

## 2021-09-17 DIAGNOSIS — M25562 Pain in left knee: Secondary | ICD-10-CM | POA: Diagnosis not present

## 2021-09-23 DIAGNOSIS — Z471 Aftercare following joint replacement surgery: Secondary | ICD-10-CM | POA: Diagnosis not present

## 2021-09-23 DIAGNOSIS — Z23 Encounter for immunization: Secondary | ICD-10-CM | POA: Diagnosis not present

## 2021-09-23 DIAGNOSIS — M6281 Muscle weakness (generalized): Secondary | ICD-10-CM | POA: Diagnosis not present

## 2021-09-23 DIAGNOSIS — M25562 Pain in left knee: Secondary | ICD-10-CM | POA: Diagnosis not present

## 2021-09-30 DIAGNOSIS — T8482XA Fibrosis due to internal orthopedic prosthetic devices, implants and grafts, initial encounter: Secondary | ICD-10-CM | POA: Diagnosis not present

## 2021-09-30 DIAGNOSIS — M24662 Ankylosis, left knee: Secondary | ICD-10-CM | POA: Diagnosis not present

## 2021-09-30 DIAGNOSIS — Z96652 Presence of left artificial knee joint: Secondary | ICD-10-CM | POA: Diagnosis not present

## 2021-10-01 DIAGNOSIS — Z471 Aftercare following joint replacement surgery: Secondary | ICD-10-CM | POA: Diagnosis not present

## 2021-10-01 DIAGNOSIS — M25562 Pain in left knee: Secondary | ICD-10-CM | POA: Diagnosis not present

## 2021-10-01 DIAGNOSIS — M6281 Muscle weakness (generalized): Secondary | ICD-10-CM | POA: Diagnosis not present

## 2021-10-02 DIAGNOSIS — Z471 Aftercare following joint replacement surgery: Secondary | ICD-10-CM | POA: Diagnosis not present

## 2021-10-02 DIAGNOSIS — M6281 Muscle weakness (generalized): Secondary | ICD-10-CM | POA: Diagnosis not present

## 2021-10-02 DIAGNOSIS — M25562 Pain in left knee: Secondary | ICD-10-CM | POA: Diagnosis not present

## 2021-10-07 DIAGNOSIS — M6281 Muscle weakness (generalized): Secondary | ICD-10-CM | POA: Diagnosis not present

## 2021-10-07 DIAGNOSIS — M25562 Pain in left knee: Secondary | ICD-10-CM | POA: Diagnosis not present

## 2021-10-07 DIAGNOSIS — Z471 Aftercare following joint replacement surgery: Secondary | ICD-10-CM | POA: Diagnosis not present

## 2021-10-09 DIAGNOSIS — M6281 Muscle weakness (generalized): Secondary | ICD-10-CM | POA: Diagnosis not present

## 2021-10-09 DIAGNOSIS — M25562 Pain in left knee: Secondary | ICD-10-CM | POA: Diagnosis not present

## 2021-10-09 DIAGNOSIS — Z471 Aftercare following joint replacement surgery: Secondary | ICD-10-CM | POA: Diagnosis not present

## 2021-10-12 DIAGNOSIS — Z471 Aftercare following joint replacement surgery: Secondary | ICD-10-CM | POA: Diagnosis not present

## 2021-10-12 DIAGNOSIS — M25562 Pain in left knee: Secondary | ICD-10-CM | POA: Diagnosis not present

## 2021-10-12 DIAGNOSIS — M6281 Muscle weakness (generalized): Secondary | ICD-10-CM | POA: Diagnosis not present

## 2021-10-14 DIAGNOSIS — M6281 Muscle weakness (generalized): Secondary | ICD-10-CM | POA: Diagnosis not present

## 2021-10-14 DIAGNOSIS — M25562 Pain in left knee: Secondary | ICD-10-CM | POA: Diagnosis not present

## 2021-10-14 DIAGNOSIS — Z471 Aftercare following joint replacement surgery: Secondary | ICD-10-CM | POA: Diagnosis not present

## 2021-10-16 DIAGNOSIS — M6281 Muscle weakness (generalized): Secondary | ICD-10-CM | POA: Diagnosis not present

## 2021-10-16 DIAGNOSIS — Z471 Aftercare following joint replacement surgery: Secondary | ICD-10-CM | POA: Diagnosis not present

## 2021-10-16 DIAGNOSIS — M25562 Pain in left knee: Secondary | ICD-10-CM | POA: Diagnosis not present

## 2021-10-19 DIAGNOSIS — Z471 Aftercare following joint replacement surgery: Secondary | ICD-10-CM | POA: Diagnosis not present

## 2021-10-19 DIAGNOSIS — M25562 Pain in left knee: Secondary | ICD-10-CM | POA: Diagnosis not present

## 2021-10-19 DIAGNOSIS — M6281 Muscle weakness (generalized): Secondary | ICD-10-CM | POA: Diagnosis not present

## 2021-10-26 DIAGNOSIS — Z471 Aftercare following joint replacement surgery: Secondary | ICD-10-CM | POA: Diagnosis not present

## 2021-10-26 DIAGNOSIS — M25562 Pain in left knee: Secondary | ICD-10-CM | POA: Diagnosis not present

## 2021-10-26 DIAGNOSIS — M6281 Muscle weakness (generalized): Secondary | ICD-10-CM | POA: Diagnosis not present

## 2021-10-31 DIAGNOSIS — M6281 Muscle weakness (generalized): Secondary | ICD-10-CM | POA: Diagnosis not present

## 2021-10-31 DIAGNOSIS — M25562 Pain in left knee: Secondary | ICD-10-CM | POA: Diagnosis not present

## 2021-10-31 DIAGNOSIS — Z471 Aftercare following joint replacement surgery: Secondary | ICD-10-CM | POA: Diagnosis not present

## 2021-11-03 DIAGNOSIS — M25562 Pain in left knee: Secondary | ICD-10-CM | POA: Diagnosis not present

## 2021-11-03 DIAGNOSIS — M6281 Muscle weakness (generalized): Secondary | ICD-10-CM | POA: Diagnosis not present

## 2021-11-03 DIAGNOSIS — Z471 Aftercare following joint replacement surgery: Secondary | ICD-10-CM | POA: Diagnosis not present

## 2021-11-06 DIAGNOSIS — Z471 Aftercare following joint replacement surgery: Secondary | ICD-10-CM | POA: Diagnosis not present

## 2021-11-06 DIAGNOSIS — M25562 Pain in left knee: Secondary | ICD-10-CM | POA: Diagnosis not present

## 2021-11-06 DIAGNOSIS — M6281 Muscle weakness (generalized): Secondary | ICD-10-CM | POA: Diagnosis not present

## 2021-11-10 DIAGNOSIS — M6281 Muscle weakness (generalized): Secondary | ICD-10-CM | POA: Diagnosis not present

## 2021-11-10 DIAGNOSIS — Z471 Aftercare following joint replacement surgery: Secondary | ICD-10-CM | POA: Diagnosis not present

## 2021-11-10 DIAGNOSIS — M25562 Pain in left knee: Secondary | ICD-10-CM | POA: Diagnosis not present

## 2021-11-12 DIAGNOSIS — M25562 Pain in left knee: Secondary | ICD-10-CM | POA: Diagnosis not present

## 2021-11-12 DIAGNOSIS — Z471 Aftercare following joint replacement surgery: Secondary | ICD-10-CM | POA: Diagnosis not present

## 2021-11-12 DIAGNOSIS — M6281 Muscle weakness (generalized): Secondary | ICD-10-CM | POA: Diagnosis not present

## 2021-11-13 DIAGNOSIS — Z96652 Presence of left artificial knee joint: Secondary | ICD-10-CM | POA: Diagnosis not present

## 2021-11-13 DIAGNOSIS — Z471 Aftercare following joint replacement surgery: Secondary | ICD-10-CM | POA: Diagnosis not present

## 2021-11-13 DIAGNOSIS — Z4789 Encounter for other orthopedic aftercare: Secondary | ICD-10-CM | POA: Diagnosis not present

## 2021-12-01 DIAGNOSIS — Z96652 Presence of left artificial knee joint: Secondary | ICD-10-CM | POA: Diagnosis not present

## 2021-12-01 DIAGNOSIS — B079 Viral wart, unspecified: Secondary | ICD-10-CM | POA: Diagnosis not present

## 2021-12-01 DIAGNOSIS — E6609 Other obesity due to excess calories: Secondary | ICD-10-CM | POA: Diagnosis not present

## 2021-12-01 DIAGNOSIS — Z7184 Encounter for health counseling related to travel: Secondary | ICD-10-CM | POA: Diagnosis not present

## 2021-12-01 DIAGNOSIS — Z6837 Body mass index (BMI) 37.0-37.9, adult: Secondary | ICD-10-CM | POA: Diagnosis not present

## 2021-12-01 DIAGNOSIS — H6591 Unspecified nonsuppurative otitis media, right ear: Secondary | ICD-10-CM | POA: Diagnosis not present

## 2021-12-01 DIAGNOSIS — L600 Ingrowing nail: Secondary | ICD-10-CM | POA: Diagnosis not present

## 2022-01-14 DIAGNOSIS — Z888 Allergy status to other drugs, medicaments and biological substances status: Secondary | ICD-10-CM | POA: Diagnosis not present

## 2022-01-14 DIAGNOSIS — Z91041 Radiographic dye allergy status: Secondary | ICD-10-CM | POA: Diagnosis not present

## 2022-01-14 DIAGNOSIS — Z885 Allergy status to narcotic agent status: Secondary | ICD-10-CM | POA: Diagnosis not present

## 2022-01-14 DIAGNOSIS — Z96643 Presence of artificial hip joint, bilateral: Secondary | ICD-10-CM | POA: Diagnosis not present

## 2022-01-14 DIAGNOSIS — Z9889 Other specified postprocedural states: Secondary | ICD-10-CM | POA: Diagnosis not present

## 2022-01-14 DIAGNOSIS — R059 Cough, unspecified: Secondary | ICD-10-CM | POA: Diagnosis not present

## 2022-01-14 DIAGNOSIS — C50912 Malignant neoplasm of unspecified site of left female breast: Secondary | ICD-10-CM | POA: Diagnosis not present

## 2022-01-14 DIAGNOSIS — Z882 Allergy status to sulfonamides status: Secondary | ICD-10-CM | POA: Diagnosis not present

## 2022-01-14 DIAGNOSIS — Z85828 Personal history of other malignant neoplasm of skin: Secondary | ICD-10-CM | POA: Diagnosis not present

## 2022-01-14 DIAGNOSIS — Z79811 Long term (current) use of aromatase inhibitors: Secondary | ICD-10-CM | POA: Diagnosis not present

## 2022-01-14 DIAGNOSIS — Z853 Personal history of malignant neoplasm of breast: Secondary | ICD-10-CM | POA: Diagnosis not present

## 2022-01-14 DIAGNOSIS — Z08 Encounter for follow-up examination after completed treatment for malignant neoplasm: Secondary | ICD-10-CM | POA: Diagnosis not present

## 2022-01-14 DIAGNOSIS — F4321 Adjustment disorder with depressed mood: Secondary | ICD-10-CM | POA: Diagnosis not present

## 2022-01-14 DIAGNOSIS — K5790 Diverticulosis of intestine, part unspecified, without perforation or abscess without bleeding: Secondary | ICD-10-CM | POA: Diagnosis not present

## 2022-01-14 DIAGNOSIS — Z886 Allergy status to analgesic agent status: Secondary | ICD-10-CM | POA: Diagnosis not present

## 2022-01-14 DIAGNOSIS — Z91040 Latex allergy status: Secondary | ICD-10-CM | POA: Diagnosis not present

## 2022-01-14 DIAGNOSIS — Z79899 Other long term (current) drug therapy: Secondary | ICD-10-CM | POA: Diagnosis not present

## 2022-01-14 DIAGNOSIS — M85832 Other specified disorders of bone density and structure, left forearm: Secondary | ICD-10-CM | POA: Diagnosis not present

## 2022-01-14 DIAGNOSIS — M8589 Other specified disorders of bone density and structure, multiple sites: Secondary | ICD-10-CM | POA: Diagnosis not present

## 2022-04-01 DIAGNOSIS — B351 Tinea unguium: Secondary | ICD-10-CM | POA: Diagnosis not present

## 2022-04-01 DIAGNOSIS — L608 Other nail disorders: Secondary | ICD-10-CM | POA: Diagnosis not present

## 2022-04-06 ENCOUNTER — Other Ambulatory Visit: Payer: Self-pay | Admitting: Cardiovascular Disease

## 2022-04-22 DIAGNOSIS — L603 Nail dystrophy: Secondary | ICD-10-CM | POA: Diagnosis not present

## 2022-05-07 ENCOUNTER — Encounter: Payer: Self-pay | Admitting: *Deleted

## 2022-05-07 ENCOUNTER — Telehealth: Payer: Self-pay | Admitting: *Deleted

## 2022-05-07 NOTE — Patient Outreach (Signed)
  Care Coordination   Initial Visit Note   05/07/2022 Name: [name] MRN: [id number] DOB: 1941/06/02  [name] is a 81 y.o. year old female who sees Abigail Sites, MD for primary care. I spoke with  [name] by phone today.  What matters to the patients health and wellness today?  Ongoing self health management    Goals Addressed             This Visit's Progress    Care Coordination Services (no follow-up required)       Care Coordination Interventions: Assessed social determinant of health barriers Assessed mobility and ability to perform ADLs Assessed family/Social support Provided patient/caregiver with verbal information on [name] [phone]) Encouraged patient to request a referral for [place] from PCP if services are needed in the future Reviewed medications and discussed affordability with patient         SDOH assessments and interventions completed:  Yes  SDOH Interventions Today    Flowsheet Row Most Recent Value  SDOH Interventions   Financial Strain Interventions Intervention Not Indicated  Housing Interventions Intervention Not Indicated  Transportation Interventions Intervention Not Indicated        Care Coordination Interventions Activated:  Yes  Care Coordination Interventions:  Yes, provided   Follow up plan: No further intervention required.   Encounter Outcome:  Pt. Visit Completed   [name], BSN, RN-BC [facility], community Dial: [phone]

## 2022-06-12 ENCOUNTER — Other Ambulatory Visit: Payer: Self-pay | Admitting: Urology

## 2022-07-09 DIAGNOSIS — M1712 Unilateral primary osteoarthritis, left knee: Secondary | ICD-10-CM | POA: Diagnosis not present

## 2022-07-09 DIAGNOSIS — M25552 Pain in left hip: Secondary | ICD-10-CM | POA: Diagnosis not present

## 2022-07-13 DIAGNOSIS — I10 Essential (primary) hypertension: Secondary | ICD-10-CM | POA: Diagnosis not present

## 2022-07-13 DIAGNOSIS — E559 Vitamin D deficiency, unspecified: Secondary | ICD-10-CM | POA: Diagnosis not present

## 2022-07-13 DIAGNOSIS — Z0001 Encounter for general adult medical examination with abnormal findings: Secondary | ICD-10-CM | POA: Diagnosis not present

## 2022-07-13 DIAGNOSIS — E782 Mixed hyperlipidemia: Secondary | ICD-10-CM | POA: Diagnosis not present

## 2022-07-13 DIAGNOSIS — Z1331 Encounter for screening for depression: Secondary | ICD-10-CM | POA: Diagnosis not present

## 2022-07-13 DIAGNOSIS — M810 Age-related osteoporosis without current pathological fracture: Secondary | ICD-10-CM | POA: Diagnosis not present

## 2022-07-13 DIAGNOSIS — H3530 Unspecified macular degeneration: Secondary | ICD-10-CM | POA: Diagnosis not present

## 2022-07-13 DIAGNOSIS — R32 Unspecified urinary incontinence: Secondary | ICD-10-CM | POA: Diagnosis not present

## 2022-07-13 DIAGNOSIS — E119 Type 2 diabetes mellitus without complications: Secondary | ICD-10-CM | POA: Diagnosis not present

## 2022-07-13 DIAGNOSIS — Z6838 Body mass index (BMI) 38.0-38.9, adult: Secondary | ICD-10-CM | POA: Diagnosis not present

## 2022-07-13 DIAGNOSIS — E039 Hypothyroidism, unspecified: Secondary | ICD-10-CM | POA: Diagnosis not present

## 2022-07-13 DIAGNOSIS — M1991 Primary osteoarthritis, unspecified site: Secondary | ICD-10-CM | POA: Diagnosis not present

## 2022-07-19 DIAGNOSIS — Z853 Personal history of malignant neoplasm of breast: Secondary | ICD-10-CM | POA: Diagnosis not present

## 2022-07-19 DIAGNOSIS — C50512 Malignant neoplasm of lower-outer quadrant of left female breast: Secondary | ICD-10-CM | POA: Diagnosis not present

## 2022-07-19 DIAGNOSIS — Z882 Allergy status to sulfonamides status: Secondary | ICD-10-CM | POA: Diagnosis not present

## 2022-07-19 DIAGNOSIS — Z91040 Latex allergy status: Secondary | ICD-10-CM | POA: Diagnosis not present

## 2022-07-19 DIAGNOSIS — Z9221 Personal history of antineoplastic chemotherapy: Secondary | ICD-10-CM | POA: Diagnosis not present

## 2022-07-19 DIAGNOSIS — C50912 Malignant neoplasm of unspecified site of left female breast: Secondary | ICD-10-CM | POA: Diagnosis not present

## 2022-07-19 DIAGNOSIS — Z91041 Radiographic dye allergy status: Secondary | ICD-10-CM | POA: Diagnosis not present

## 2022-07-19 DIAGNOSIS — M85832 Other specified disorders of bone density and structure, left forearm: Secondary | ICD-10-CM | POA: Diagnosis not present

## 2022-07-19 DIAGNOSIS — Z885 Allergy status to narcotic agent status: Secondary | ICD-10-CM | POA: Diagnosis not present

## 2022-07-19 DIAGNOSIS — Z886 Allergy status to analgesic agent status: Secondary | ICD-10-CM | POA: Diagnosis not present

## 2022-07-19 DIAGNOSIS — Z85828 Personal history of other malignant neoplasm of skin: Secondary | ICD-10-CM | POA: Diagnosis not present

## 2022-07-19 DIAGNOSIS — Z881 Allergy status to other antibiotic agents status: Secondary | ICD-10-CM | POA: Diagnosis not present

## 2022-07-19 DIAGNOSIS — Z08 Encounter for follow-up examination after completed treatment for malignant neoplasm: Secondary | ICD-10-CM | POA: Diagnosis not present

## 2022-07-19 DIAGNOSIS — R928 Other abnormal and inconclusive findings on diagnostic imaging of breast: Secondary | ICD-10-CM | POA: Diagnosis not present

## 2022-07-19 DIAGNOSIS — Z79811 Long term (current) use of aromatase inhibitors: Secondary | ICD-10-CM | POA: Diagnosis not present

## 2022-07-19 DIAGNOSIS — Z888 Allergy status to other drugs, medicaments and biological substances status: Secondary | ICD-10-CM | POA: Diagnosis not present

## 2022-07-19 DIAGNOSIS — Z79899 Other long term (current) drug therapy: Secondary | ICD-10-CM | POA: Diagnosis not present

## 2022-07-21 DIAGNOSIS — Z23 Encounter for immunization: Secondary | ICD-10-CM | POA: Diagnosis not present

## 2022-07-26 ENCOUNTER — Telehealth: Payer: Self-pay | Admitting: Cardiovascular Disease

## 2022-07-26 NOTE — Telephone Encounter (Signed)
Patient states she is returning a call from last week, but she does not know who called or what the call may have been regarding. Please advise.

## 2022-07-26 NOTE — Telephone Encounter (Signed)
Unable to leave message, voicemail not set up.

## 2022-08-03 ENCOUNTER — Encounter: Payer: Self-pay | Admitting: Urology

## 2022-08-03 ENCOUNTER — Ambulatory Visit (INDEPENDENT_AMBULATORY_CARE_PROVIDER_SITE_OTHER): Payer: Medicare Other | Admitting: Urology

## 2022-08-03 VITALS — BP 126/72 | HR 81

## 2022-08-03 DIAGNOSIS — N3946 Mixed incontinence: Secondary | ICD-10-CM

## 2022-08-03 DIAGNOSIS — N3281 Overactive bladder: Secondary | ICD-10-CM

## 2022-08-03 LAB — URINALYSIS, ROUTINE W REFLEX MICROSCOPIC
Bilirubin, UA: NEGATIVE
Glucose, UA: NEGATIVE
Ketones, UA: NEGATIVE
Leukocytes,UA: NEGATIVE
Nitrite, UA: NEGATIVE
Protein,UA: NEGATIVE
RBC, UA: NEGATIVE
Specific Gravity, UA: 1.02 (ref 1.005–1.030)
Urobilinogen, Ur: 0.2 mg/dL (ref 0.2–1.0)
pH, UA: 6 (ref 5.0–7.5)

## 2022-08-03 NOTE — Progress Notes (Signed)
History of Present Illness: [name] is a 81 y.o. year old female who returns for follow-up of overactive bladder.  She does have urgency incontinence which at times seems to be getting worse.  She goes through 2-3 pads a day.  She usually wakes up at least 2 times a night to urinate.  She is on tolterodine.  Since her visit here last year she has had no urinary tract infections.  Past Medical History:  Diagnosis Date   Arthritis    Bladder spasms    "spastic bladder" wears pads   Cancer ([place])    Skin cancer -scalp and eyebrow"basal cell"   Complication of anesthesia    , woke up too early from surgery and pt had floppy fish syndrome   GERD (gastroesophageal reflux disease)    occ. frequent "hiccoughs" after "eating, indigestion"   Hyperlipemia    Hypertension    Hypothyroidism    Macular degeneration    Trigger finger, left    alternate fingers affected. -Occ. shooting pain.   Wears glasses     Past Surgical History:  Procedure Laterality Date   ABDOMINAL HYSTERECTOMY  09/07/1987   APPENDECTOMY     CARDIAC CATHETERIZATION  09/06/2004   normal-done for work up pre hip surgeries   CARPAL TUNNEL RELEASE Left    CHOLECYSTECTOMY  09/07/1995   JOINT REPLACEMENT     BTHA   left breast lumpectomy      POLYPECTOMY Bilateral 06/26/2013   Procedure: BILATERAL EXCISION OF NASAL POLYPS ;  Surgeon: Ascencion Dike, MD;  Location: [facility];  Service: ENT;  Laterality: Bilateral;   SEPTOPLASTY N/A 06/26/2013   Procedure: AND SEPTOPLASTY;  Surgeon: Ascencion Dike, MD;  Location: [facility];  Service: ENT;  Laterality: N/A;   THYROIDECTOMY  09/06/1992   TONSILLECTOMY     TOTAL HIP ARTHROPLASTY  09/06/2004   right   TOTAL HIP ARTHROPLASTY  09/06/2004   left   TOTAL KNEE ARTHROPLASTY Right 12/13/2014   Procedure: RIGHT TOTAL KNEE ARTHROPLASTY;  Surgeon: Gaynelle Arabian, MD;  [facility];  Service: Orthopedics;  Laterality: Right;   TOTAL KNEE ARTHROPLASTY  Left 07/13/2021   Procedure: TOTAL KNEE ARTHROPLASTY;  Surgeon: Gaynelle Arabian, MD;  [facility];  Service: Orthopedics;  Laterality: Left;    Home Medications:  (Not in a hospital admission)   Allergies:  Allergies  Allergen Reactions   Feldene [Piroxicam] Other (See Comments)    CANT REMEMBER   Gabapentin Other (See Comments)    Gi upset   Ivp Dye [Iodinated Contrast Media] Other (See Comments)    CANT REMEMBER   Latex Other (See Comments)    Tickle nose   Sulfa Antibiotics     CANT REMEMBER   Naproxen Nausea And Vomiting   Vicodin [Hydrocodone-Acetaminophen] Nausea And Vomiting    No family history on file.  Social History:  reports that she has never smoked. She has never used smokeless tobacco. She reports that she does not drink alcohol and does not use drugs.  ROS: A complete review of systems was performed.  All systems are negative except for pertinent findings as noted.  Physical Exam:  Vital signs in last 24 hours: '@VSRANGES'$ @ General:  Alert and oriented, No acute distress HEENT: Normocephalic, atraumatic Neck: No JVD or lymphadenopathy Cardiovascular: Regular rate.  She has no significant peripheral edema Lungs: Normal inspiratory/expiratory excursion Extremities: No edema Neurologic: Grossly intact  I have reviewed prior pt notes  I  have reviewed urinalysis results   Impression/Assessment:  Overactive bladder with urgency incontinence, seemingly getting worse for the patient  Plan:  I gave her an overactive bladder guide sheet  Limit afternoon fluid intake to help her nocturia  I offered to set her up for physical therapy for management of her incontinence-she wants to do this  I will see her back in 4 months to recheck symptoms  Lillette Boxer Annice Jolly 08/03/2022, 8:19 AM  Lillette Boxer. Keysean Savino MD

## 2022-08-20 DIAGNOSIS — Z96641 Presence of right artificial hip joint: Secondary | ICD-10-CM | POA: Diagnosis not present

## 2022-08-20 DIAGNOSIS — Z96643 Presence of artificial hip joint, bilateral: Secondary | ICD-10-CM | POA: Diagnosis not present

## 2022-08-20 DIAGNOSIS — Z96651 Presence of right artificial knee joint: Secondary | ICD-10-CM | POA: Diagnosis not present

## 2022-08-20 DIAGNOSIS — Z96653 Presence of artificial knee joint, bilateral: Secondary | ICD-10-CM | POA: Diagnosis not present

## 2022-08-24 ENCOUNTER — Ambulatory Visit: Payer: Medicare Other | Attending: Cardiovascular Disease | Admitting: Cardiovascular Disease

## 2022-08-24 ENCOUNTER — Encounter: Payer: Self-pay | Admitting: Cardiovascular Disease

## 2022-08-24 VITALS — BP 124/68 | HR 65 | Ht 63.0 in | Wt 215.2 lb

## 2022-08-24 DIAGNOSIS — E782 Mixed hyperlipidemia: Secondary | ICD-10-CM | POA: Insufficient documentation

## 2022-08-24 DIAGNOSIS — I10 Essential (primary) hypertension: Secondary | ICD-10-CM | POA: Diagnosis not present

## 2022-08-24 NOTE — Progress Notes (Signed)
08/24/2022 [name]   Mar 06, 1941  [id number]  Primary Physician Sharilyn Sites, MD Primary Cardiologist: Lorretta Harp MD Garret Reddish, [place], [place]  HPI:  [name] is a 81 y.o.  mildly overweight single Caucasian female with no children referred by Collene Mares, PA-C to evaluate bilateral lower extremity edema.  I last saw her in the office 06/30/2021.  Her mother [name] was also a patient of mine who unfortunately passed away at age 66 on 04-24-2020.  She does have a history of treated hypertension and hyperlipidemia.  She is never had a heart attack or stroke.  Her mother does have CAD and she is my patient.  She denies chest pain or shortness of breath.  She is had lower extremity edema for the last several weeks with a recent BMP drawn by her PCP of 144.  She was begun on as needed Lasix which has improved her swelling somewhat.   She developed some chest pain occurring several times a week lasting minutes at a time.  I did perform Myoview stress testing 04/04/2019 which was low risk and nonischemic.   Since I saw her a year ago she remains well.  She underwent left total knee replacement by Dr. Wynelle Link 07/13/2021 which she is cleared for at low risk.  She has had bilateral total hip replacements back in 06 and right total knee replacement in 2016.  She underwent successful left total knee replacement 07/13/2021.  She is recuperating from this nicely.  She denies chest pain or shortness of breath.  She apparently spent 3 weeks in [place] this past April.   Current Meds  Medication Sig   calcium citrate (CALCITRATE - DOSED IN MG ELEMENTAL CALCIUM) 950 (200 Ca) MG tablet Take 200 tablets by mouth 2 (two) times daily.   hydrochlorothiazide (MICROZIDE) 12.5 MG capsule TAKE 1 CAPSULE BY MOUTH EVERY DAY   levothyroxine (SYNTHROID, LEVOTHROID) 125 MCG tablet Take 125 mcg by mouth daily before breakfast.   losartan (COZAAR) 50 MG tablet TAKE 1 TABLET BY MOUTH EVERY DAY    metoprolol tartrate (LOPRESSOR) 50 MG tablet Take 50 mg by mouth 2 (two) times daily.   Polyethyl Glycol-Propyl Glycol (SYSTANE) 0.4-0.3 % SOLN Place 1 application into both eyes daily as needed (Dry eye).   simvastatin (ZOCOR) 10 MG tablet Take 10 mg by mouth at bedtime.   tolterodine (DETROL LA) 4 MG 24 hr capsule TAKE ONE CAPSULE BY MOUTH DAILY   Vitamins A & D (VITAMIN A & D) ointment Apply 1 application topically daily as needed (Gaulding (Skin Chafing)).     Allergies  Allergen Reactions   Feldene [Piroxicam] Other (See Comments)    CANT REMEMBER   Gabapentin Other (See Comments)    Gi upset   Ivp Dye [Iodinated Contrast Media] Other (See Comments)    CANT REMEMBER   Latex Other (See Comments)    Tickle nose   Sulfa Antibiotics     CANT REMEMBER   Naproxen Nausea And Vomiting   Vicodin [Hydrocodone-Acetaminophen] Nausea And Vomiting    Social History   Socioeconomic History   Marital status: Single    Spouse name: Not on file   Number of children: Not on file   Years of education: Not on file   Highest education level: Not on file  Occupational History   Not on file  Tobacco Use   Smoking status: Never   Smokeless tobacco: Never  Vaping Use   Vaping Use: Never  used  Substance and Sexual Activity   Alcohol use: Never   Drug use: No   Sexual activity: Not Currently  Other Topics Concern   Not on file  Social History Narrative   Not on file   Social Determinants of Health   Financial Resource Strain: Low Risk  (05/07/2022)   Overall Financial Resource Strain (CARDIA)    Difficulty of Paying Living Expenses: Not hard at all  Food Insecurity: Not on file  Transportation Needs: No Transportation Needs (05/07/2022)   PRAPARE - Hydrologist (Medical): No    Lack of Transportation (Non-Medical): No  Physical Activity: Not on file  Stress: Not on file  Social Connections: Not on file  Intimate Partner Violence: Not on file     Review  of Systems: General: negative for chills, fever, night sweats or weight changes.  Cardiovascular: negative for chest pain, dyspnea on exertion, edema, orthopnea, palpitations, paroxysmal nocturnal dyspnea or shortness of breath Dermatological: negative for rash Respiratory: negative for cough or wheezing Urologic: negative for hematuria Abdominal: negative for nausea, vomiting, diarrhea, bright red blood per rectum, melena, or hematemesis Neurologic: negative for visual changes, syncope, or dizziness All other systems reviewed and are otherwise negative except as noted above.    Blood pressure 124/68, pulse 65, height '5\' 3"'$  (1.6 m), weight 215 lb 3.2 oz (97.6 kg), SpO2 98 %.  General appearance: alert and no distress Neck: no adenopathy, no carotid bruit, no JVD, supple, symmetrical, trachea midline, and thyroid not enlarged, symmetric, no tenderness/mass/nodules Lungs: clear to auscultation bilaterally Heart: regular rate and rhythm, S1, S2 normal, no murmur, click, rub or gallop Extremities: extremities normal, atraumatic, no cyanosis or edema Pulses: 2+ and symmetric Skin: Skin color, texture, turgor normal. No rashes or lesions Neurologic: Grossly normal  EKG sinus rhythm at 65 with nonspecific ST and T wave changes.  I personally reviewed this EKG.  ASSESSMENT AND PLAN:   Essential hypertension History of essential hypertension with blood pressure measured today at 124/68.  She is on hydrochlorothiazide and losartan.  Hyperlipidemia History of hyperlipidemia on statin therapy followed by her PCP     Lorretta Harp MD [facility], [facility] 08/24/2022 11:55 AM

## 2022-08-24 NOTE — Assessment & Plan Note (Signed)
History of essential hypertension with blood pressure measured today at 124/68.  She is on hydrochlorothiazide and losartan.

## 2022-08-24 NOTE — Assessment & Plan Note (Signed)
History of hyperlipidemia on statin therapy followed by her PCP. 

## 2022-08-24 NOTE — Patient Instructions (Signed)
Medication Instructions:  Your physician recommends that you continue on your current medications as directed. Please refer to the Current Medication list given to you today.  *If you need a refill on your cardiac medications before your next appointment, please call your pharmacy*   Follow-Up: At [facility], you and your health needs are our priority.  As part of our continuing mission to provide you with exceptional heart care, we have created designated Provider Care Teams.  These Care Teams include your primary Cardiologist (physician) and Advanced Practice Providers (APPs -  Physician Assistants and Nurse Practitioners) who all work together to provide you with the care you need, when you need it.  We recommend signing up for the patient portal called "MyChart".  Sign up information is provided on this After Visit Summary.  MyChart is used to connect with patients for Virtual Visits (Telemedicine).  Patients are able to view lab/test results, encounter notes, upcoming appointments, etc.  Non-urgent messages can be sent to your provider as well.   To learn more about what you can do with MyChart, go to [email or web address].    Your next appointment:   12 month(s)  The format for your next appointment:   In Person  Provider:   Jonathan Berry, MD   

## 2022-08-25 ENCOUNTER — Ambulatory Visit (HOSPITAL_COMMUNITY): Payer: Medicare Other | Admitting: Physical Therapy

## 2022-08-30 ENCOUNTER — Other Ambulatory Visit: Payer: Self-pay | Admitting: Cardiovascular Disease

## 2022-09-02 ENCOUNTER — Ambulatory Visit (HOSPITAL_COMMUNITY): Payer: Medicare Other | Admitting: Physical Therapy

## 2022-09-14 ENCOUNTER — Ambulatory Visit (HOSPITAL_COMMUNITY): Payer: PPO | Attending: Urology | Admitting: Physical Therapy

## 2022-09-14 DIAGNOSIS — N3946 Mixed incontinence: Secondary | ICD-10-CM | POA: Insufficient documentation

## 2022-09-14 NOTE — Therapy (Signed)
OUTPATIENT PHYSICAL THERAPY FEMALE PELVIC EVALUATION   Patient Name: [name] MRN: [id number] DOB:09-08-1940, 82 y.o., female Today's Date: 09/14/2022  END OF SESSION:  PT End of Session - 09/14/22 1046     Visit Number 1    Number of Visits 4    Date for PT Re-Evaluation 10/14/22    Authorization - Visit Number 1    Authorization - Number of Visits 4    Progress Note Due on Visit 4    PT Start Time 0954    PT Stop Time 1052    PT Time Calculation (min) 58 min    Activity Tolerance Patient tolerated treatment well    Behavior During Therapy WFL for tasks assessed/performed             Past Medical History:  Diagnosis Date   Arthritis    Bladder spasms    "spastic bladder" wears pads   Cancer ([place])    Skin cancer -scalp and eyebrow"basal cell"   Complication of anesthesia    , woke up too early from surgery and pt had floppy fish syndrome   GERD (gastroesophageal reflux disease)    occ. frequent "hiccoughs" after "eating, indigestion"   Hyperlipemia    Hypertension    Hypothyroidism    Macular degeneration    Trigger finger, left    alternate fingers affected. -Occ. shooting pain.   Wears glasses    Past Surgical History:  Procedure Laterality Date   ABDOMINAL HYSTERECTOMY  09/07/1987   APPENDECTOMY     CARDIAC CATHETERIZATION  09/06/2004   normal-done for work up pre hip surgeries   CARPAL TUNNEL RELEASE Left    CHOLECYSTECTOMY  09/07/1995   JOINT REPLACEMENT     BTHA   left breast lumpectomy      POLYPECTOMY Bilateral 06/26/2013   Procedure: BILATERAL EXCISION OF NASAL POLYPS ;  Surgeon: Ascencion Dike, MD;  Location: [facility];  Service: ENT;  Laterality: Bilateral;   SEPTOPLASTY N/A 06/26/2013   Procedure: AND SEPTOPLASTY;  Surgeon: Ascencion Dike, MD;  Location: [facility];  Service: ENT;  Laterality: N/A;   THYROIDECTOMY  09/06/1992   TONSILLECTOMY     TOTAL HIP ARTHROPLASTY  09/06/2004   right   TOTAL HIP ARTHROPLASTY   09/06/2004   left   TOTAL KNEE ARTHROPLASTY Right 12/13/2014   Procedure: RIGHT TOTAL KNEE ARTHROPLASTY;  Surgeon: Gaynelle Arabian, MD;  Location: [facility];  Service: Orthopedics;  Laterality: Right;   TOTAL KNEE ARTHROPLASTY Left 07/13/2021   Procedure: TOTAL KNEE ARTHROPLASTY;  Surgeon: Gaynelle Arabian, MD;  Location: [facility];  Service: Orthopedics;  Laterality: Left;   Patient Active Problem List   Diagnosis Date Noted   Primary osteoarthritis of left knee 07/13/2021   History of chest pain 03/20/2019   History of breast cancer 08/14/2018   Obesity (BMI 30-39.9) 08/14/2018   Bilateral lower extremity edema 02/14/2018   Essential hypertension 02/14/2018   Hyperlipidemia 02/14/2018   Family history of heart disease 02/14/2018   Onychomycosis 08/26/2016   OA (osteoarthritis) of knee 12/13/2014    PCP: Sharilyn Sites  REFERRING PROVIDER: Franchot Gallo, MD  REFERRING DIAG: N39.46 (ICD-10-CM) - Mixed incontinence  THERAPY DIAG:  Mixed incontinence  Rationale for Evaluation and Treatment: Rehabilitation  ONSET DATE:chronic   SUBJECTIVE:  SUBJECTIVE STATEMENT: PT states that she has been incontinent since about 2000.  She has been to a therapist years ago and has had treatments from her urologist.  The problem is increasing therefore she is being referred by her MD.  She states that it is a continence thing but also overactive as at night she has to get up every two hours which is disrupting her sleep.  She is also voiding more than usual during the day.   Fluid intake: Yes: states MD stated not to drink anything after 4:00 pm     PAIN:  Are you having pain? No   PRECAUTIONS: None  WEIGHT BEARING RESTRICTIONS: No  FALLS:  Has patient fallen in last 6 months? No  OCCUPATION: retired  PLOF:  Independent  PATIENT GOALS: Go to the bathroom less, less leakage    BOWEL MOVEMENT: Pain with bowel movement: No  Fully empty rectum: Yes:   Leakage: No Pads: No Fiber supplement: Yes: natural prunes   URINATION: Pain with urination: No Fully empty bladder: No Stream: Strong Urgency: Yes:   Frequency: 9 times or more a day  Leakage: Urge to void, Walking to the bathroom, Coughing, and Sneezing Pads: Yes: 3 or more   No children   PROLAPSE: no  OBJECTIVE:   POSTURE: rounded shoulders, forward head, decreased lumbar lordosis, increased thoracic kyphosis, and protruding abdominal   Abdominal mm 2/5   TODAY'S TREATMENT:                                                                                                                              DATE: 09/14/2022:  EVAL  Sitting:  Abdominal contraction 5" x 10 Kegal: Fast twitch:  2" hold , 4" relax x 10 Slow :  45" hold x 1 Education on posture as well as timing bathroom breaks.   PATIENT EDUCATION:  Education details: HEP Person educated: Patient Education method: Consulting civil engineer, Media planner, Corporate treasurer cues, and Handouts Education comprehension: verbalized understanding, returned demonstration, and tactile cues required  HOME EXERCISE PROGRAM: HEP  ASSESSMENT:  CLINICAL IMPRESSION: Patient is a 82 y.o. female who was seen today for physical therapy evaluation and treatment for mixed incontinence. Evaluation demonstrates poor posture, decreased abdominal strength, decreased coordination which has caused mixed incontinence.  [name] will benefit from skilled PT to address these issues to decrease her episodes of incontinence.    OBJECTIVE IMPAIRMENTS: decreased strength and postural dysfunction.   ACTIVITY LIMITATIONS: hygiene/grooming    PERSONAL FACTORS: Age and Fitness are also affecting patient's functional outcome.   REHAB POTENTIAL: Fair    CLINICAL DECISION MAKING: Evolving/moderate  complexity  EVALUATION COMPLEXITY: Moderate   GOALS: Goals reviewed with patient? No  SHORT TERM GOALS: Target date: 09/28/22   PT to be completing her HEP to improve her abdominal strength Baseline: Goal status: INITIAL  2.  Pt to only have to use 3 pads a day for her incontinence issues Baseline:  Goal status: INITIAL  LONG TERM GOALS: Target date: 10/12/2022  PT to be completing an advanced HEP in order to improve her abdominal strength Baseline:  Goal status: INITIAL  2.  PT to only be using 2 pads a day for her incontinence issues.  Baseline:  Goal status: INITIAL    PLAN:  PT FREQUENCY: 1x/week  PT DURATION: 4 weeks  PLANNED INTERVENTIONS: Therapeutic exercises, Therapeutic activity, Patient/Family education, and Self Care  PLAN FOR NEXT SESSION: progress to heel slides, hip abduction, hip extension, all four if able    Rayetta Humphrey, PT CLT [phone]  09/14/2022, 10:56 AM

## 2022-09-24 ENCOUNTER — Ambulatory Visit (HOSPITAL_COMMUNITY): Payer: PPO | Admitting: Physical Therapy

## 2022-09-24 ENCOUNTER — Other Ambulatory Visit: Payer: Self-pay

## 2022-09-24 DIAGNOSIS — N3946 Mixed incontinence: Secondary | ICD-10-CM | POA: Diagnosis not present

## 2022-09-24 NOTE — Therapy (Signed)
OUTPATIENT PHYSICAL THERAPY FEMALE PELVIC treatment    Patient Name: [name] MRN: [id number] DOB:23-Jun-1941, 82 y.o., female Today's Date: 09/24/2022  END OF SESSION:  PT End of Session - 09/24/22 1720    Visit Number 2    Number of Visits 4    Date for PT Re-Evaluation 10/14/22    Authorization - Visit Number 2    Authorization - Number of Visits 4    Progress Note Due on Visit 4    PT Start Time 8916    PT Stop Time 1720    PT Time Calculation (min) 30 min    Activity Tolerance Patient tolerated treatment well    Behavior During Therapy WFL for tasks assessed/performed             Past Medical History:  Diagnosis Date   Arthritis    Bladder spasms    "spastic bladder" wears pads   Cancer ([place])    Skin cancer -scalp and eyebrow"basal cell"   Complication of anesthesia    , woke up too early from surgery and pt had floppy fish syndrome   GERD (gastroesophageal reflux disease)    occ. frequent "hiccoughs" after "eating, indigestion"   Hyperlipemia    Hypertension    Hypothyroidism    Macular degeneration    Trigger finger, left    alternate fingers affected. -Occ. shooting pain.   Wears glasses    Past Surgical History:  Procedure Laterality Date   ABDOMINAL HYSTERECTOMY  09/07/1987   APPENDECTOMY     CARDIAC CATHETERIZATION  09/06/2004   normal-done for work up pre hip surgeries   CARPAL TUNNEL RELEASE Left    CHOLECYSTECTOMY  09/07/1995   JOINT REPLACEMENT     BTHA   left breast lumpectomy      POLYPECTOMY Bilateral 06/26/2013   Procedure: BILATERAL EXCISION OF NASAL POLYPS ;  Surgeon: Ascencion Dike, MD;  Location: [facility];  Service: ENT;  Laterality: Bilateral;   SEPTOPLASTY N/A 06/26/2013   Procedure: AND SEPTOPLASTY;  Surgeon: Ascencion Dike, MD;  Location: [facility];  Service: ENT;  Laterality: N/A;   THYROIDECTOMY  09/06/1992   TONSILLECTOMY     TOTAL HIP ARTHROPLASTY  09/06/2004   right   TOTAL HIP ARTHROPLASTY   09/06/2004   left   TOTAL KNEE ARTHROPLASTY Right 12/13/2014   Procedure: RIGHT TOTAL KNEE ARTHROPLASTY;  Surgeon: Gaynelle Arabian, MD;  Location: [facility];  Service: Orthopedics;  Laterality: Right;   TOTAL KNEE ARTHROPLASTY Left 07/13/2021   Procedure: TOTAL KNEE ARTHROPLASTY;  Surgeon: Gaynelle Arabian, MD;  Location: [facility];  Service: Orthopedics;  Laterality: Left;   Patient Active Problem List   Diagnosis Date Noted   Primary osteoarthritis of left knee 07/13/2021   History of chest pain 03/20/2019   History of breast cancer 08/14/2018   Obesity (BMI 30-39.9) 08/14/2018   Bilateral lower extremity edema 02/14/2018   Essential hypertension 02/14/2018   Hyperlipidemia 02/14/2018   Family history of heart disease 02/14/2018   Onychomycosis 08/26/2016   OA (osteoarthritis) of knee 12/13/2014    PCP: Sharilyn Sites  REFERRING PROVIDER: Franchot Gallo, MD  REFERRING DIAG: N39.46 (ICD-10-CM) - Mixed incontinence  THERAPY DIAG:  Mixed incontinence  Rationale for Evaluation and Treatment: Rehabilitation  ONSET DATE:chronic  SUBJECTIVE STATEMENT:  Pt states that she has done the exercises some but not consistently.  She has been completing her diary but not trying to hold and not going  PAIN:  Are you having pain? No   PRECAUTIONS: None  WEIGHT BEARING RESTRICTIONS: No  FALLS:  Has patient fallen in last 6 months? No  OCCUPATION: retired  PLOF: Independent  PATIENT GOALS: Go to the bathroom less, less leakage    BOWEL MOVEMENT: Pain with bowel movement: No  Fully empty rectum: Yes:   Leakage: No Pads: No Fiber supplement: Yes: natural prunes   URINATION: Pain with urination: No Fully empty bladder: No Stream: Strong Urgency: Yes:   Frequency: 9 times or more a day  Leakage: Urge to  void, Walking to the bathroom, Coughing, and Sneezing Pads: Yes: 3 or more   No children   PROLAPSE: no  OBJECTIVE:   POSTURE: rounded shoulders, forward head, decreased lumbar lordosis, increased thoracic kyphosis, and protruding abdominal   Abdominal mm 2/5   TODAY'S TREATMENT:                                                                                                                              DATE:  09/24/2022; Sitting: Kegals fast twitch Hold 2" rest 4" x 10 Supine: Abdominal set x 10 Head curl 10 Heel slide x 10   09/14/2022:  EVAL  Sitting:  Abdominal contraction 5" x 10 Kegal: Fast twitch:  2" hold , 4" relax x 10 Slow :  45" hold x 1 Education on posture as well as timing bathroom breaks.   PATIENT EDUCATION:  Education details: HEP Person educated: Patient Education method: Education officer, environmental, Corporate treasurer cues, and Handouts Education comprehension: verbalized understanding, returned demonstration, and tactile cues required  HOME EXERCISE PROGRAM: HEP  ASSESSMENT:  CLINICAL IMPRESSION: Pt has not been consistent in her completing her exercises and has not noted any difference.  Therapist only added head lifts and leg slides to pt program due to inconsistency of completing exercises.  Explained the importance of completing exercises on a regular basis if she wants to see results.   [name] will benefit from skilled PT to address these issues to decrease her episodes of incontinence.    OBJECTIVE IMPAIRMENTS: decreased strength and postural dysfunction.   ACTIVITY LIMITATIONS: hygiene/grooming    PERSONAL FACTORS: Age and Fitness are also affecting patient's functional outcome.   REHAB POTENTIAL: Fair    CLINICAL DECISION MAKING: Evolving/moderate complexity  EVALUATION COMPLEXITY: Moderate   GOALS: Goals reviewed with patient? No  SHORT TERM GOALS: Target date: 09/28/22   PT to be completing her HEP to improve her abdominal  strength Baseline: Goal status: INITIAL  2.  Pt to only have to use 3 pads a day for her incontinence issues Baseline:  Goal status: INITIAL    LONG TERM GOALS: Target date: 10/12/2022  PT to be completing an advanced HEP in order to improve her abdominal  strength Baseline:  Goal status: INITIAL  2.  PT to only be using 2 pads a day for her incontinence issues.  Baseline:  Goal status: INITIAL    PLAN:  PT FREQUENCY: 1x/week  PT DURATION: 4 weeks  PLANNED INTERVENTIONS: Therapeutic exercises, Therapeutic activity, Patient/Family education, and Self Care  PLAN FOR NEXT SESSION: progress to heel slides, hip abduction, hip extension, all four if able    Rayetta Humphrey, PT CLT [phone]  09/24/2022, 5:25 PM

## 2022-10-01 ENCOUNTER — Other Ambulatory Visit: Payer: Self-pay

## 2022-10-01 ENCOUNTER — Ambulatory Visit (HOSPITAL_COMMUNITY): Payer: PPO | Admitting: Physical Therapy

## 2022-10-01 DIAGNOSIS — N3946 Mixed incontinence: Secondary | ICD-10-CM

## 2022-10-01 NOTE — Therapy (Signed)
OUTPATIENT PHYSICAL THERAPY FEMALE PELVIC treatment    Patient Name: [name] MRN: [id number] DOB:10-14-1940, 82 y.o., female Today's Date: 09/24/2022  END OF SESSION:  PT End of Session - 09/24/22 1720    Visit Number 2    Number of Visits 4    Date for PT Re-Evaluation 10/14/22    Authorization - Visit Number 2    Authorization - Number of Visits 4    Progress Note Due on Visit 4    PT Start Time 9509    PT Stop Time 1720    PT Time Calculation (min) 30 min    Activity Tolerance Patient tolerated treatment well    Behavior During Therapy WFL for tasks assessed/performed             Past Medical History:  Diagnosis Date   Arthritis    Bladder spasms    "spastic bladder" wears pads   Cancer ([place])    Skin cancer -scalp and eyebrow"basal cell"   Complication of anesthesia    , woke up too early from surgery and pt had floppy fish syndrome   GERD (gastroesophageal reflux disease)    occ. frequent "hiccoughs" after "eating, indigestion"   Hyperlipemia    Hypertension    Hypothyroidism    Macular degeneration    Trigger finger, left    alternate fingers affected. -Occ. shooting pain.   Wears glasses    Past Surgical History:  Procedure Laterality Date   ABDOMINAL HYSTERECTOMY  09/07/1987   APPENDECTOMY     CARDIAC CATHETERIZATION  09/06/2004   normal-done for work up pre hip surgeries   CARPAL TUNNEL RELEASE Left    CHOLECYSTECTOMY  09/07/1995   JOINT REPLACEMENT     BTHA   left breast lumpectomy      POLYPECTOMY Bilateral 06/26/2013   Procedure: BILATERAL EXCISION OF NASAL POLYPS ;  Surgeon: Ascencion Dike, MD;  Location: [facility];  Service: ENT;  Laterality: Bilateral;   SEPTOPLASTY N/A 06/26/2013   Procedure: AND SEPTOPLASTY;  Surgeon: Ascencion Dike, MD;  Location: [facility];  Service: ENT;  Laterality: N/A;   THYROIDECTOMY  09/06/1992   TONSILLECTOMY     TOTAL HIP ARTHROPLASTY  09/06/2004   right   TOTAL HIP ARTHROPLASTY   09/06/2004   left   TOTAL KNEE ARTHROPLASTY Right 12/13/2014   Procedure: RIGHT TOTAL KNEE ARTHROPLASTY;  Surgeon: Gaynelle Arabian, MD;  [facility];  Service: Orthopedics;  Laterality: Right;   TOTAL KNEE ARTHROPLASTY Left 07/13/2021   Procedure: TOTAL KNEE ARTHROPLASTY;  Surgeon: Gaynelle Arabian, MD;  [facility];  Service: Orthopedics;  Laterality: Left;   Patient Active Problem List   Diagnosis Date Noted   Primary osteoarthritis of left knee 07/13/2021   History of chest pain 03/20/2019   History of breast cancer 08/14/2018   Obesity (BMI 30-39.9) 08/14/2018   Bilateral lower extremity edema 02/14/2018   Essential hypertension 02/14/2018   Hyperlipidemia 02/14/2018   Family history of heart disease 02/14/2018   Onychomycosis 08/26/2016   OA (osteoarthritis) of knee 12/13/2014    PCP: Sharilyn Sites  REFERRING PROVIDER: Franchot Gallo, MD  REFERRING DIAG: N39.46 (ICD-10-CM) - Mixed incontinence  THERAPY DIAG:  Mixed incontinence  Rationale for Evaluation and Treatment: Rehabilitation  ONSET DATE:chronic  SUBJECTIVE STATEMENT:  Pt states that she still has not been consistent doing her exercises.  Her neck is hurting from doing the exercise where she lifts her head.   PT unable to complete Lt side lying activity due to increased pain.  States exercises is causing back pain. PAIN:  Are you having pain? Back 5/10   PRECAUTIONS: None  WEIGHT BEARING RESTRICTIONS: No  FALLS:  Has patient fallen in last 6 months? No  OCCUPATION: retired  PLOF: Independent  PATIENT GOALS: Go to the bathroom less, less leakage    BOWEL MOVEMENT: Pain with bowel movement: No  Fully empty rectum: Yes:   Leakage: No Pads: No Fiber supplement: Yes: natural prunes   URINATION: Pain with  urination: No Fully empty bladder: No Stream: Strong Urgency: Yes:   Frequency: 9 times or more a day  Leakage: Urge to void, Walking to the bathroom, Coughing, and Sneezing Pads: Yes: 3 or more   No children   PROLAPSE: no  OBJECTIVE:   POSTURE: rounded shoulders, forward head, decreased lumbar lordosis, increased thoracic kyphosis, and protruding abdominal   Abdominal mm 2/5   TODAY'S TREATMENT:                                                                                                                              DATE:  10/01/22 Supine: Heel slide x 15 Scapular retraction now go higher x 5  instead crunch as this caused neck pain Bent knee raise x 10 Rt hip abduction x 10  Fast twitch kegal x 2 minutes contract 2" relax 4" RT Side-lying for  LT hip abduction x 10  09/24/2022; Sitting: Kegals fast twitch Hold 2" rest 4" x 10 Supine: Abdominal set x 10 Head curl 10 Heel slide x 10   09/14/2022:  EVAL  Sitting:  Abdominal contraction 5" x 10 Kegal: Fast twitch:  2" hold , 4" relax x 10 Slow :  45" hold x 1 Education on posture as well as timing bathroom breaks.   PATIENT EDUCATION:  Education details: HEP Person educated: Patient Education method: Education officer, environmental, Corporate treasurer cues, and Handouts Education comprehension: verbalized understanding, returned demonstration, and tactile cues required  HOME EXERCISE PROGRAM: HEP  ASSESSMENT:  CLINICAL IMPRESSION:  Therapist modified head curl due to pt complaining of pain.  Therapist modified Lt hip abduction due to pt stating that she could not lie on her side.  Added bend knee raise to pt program and increased fast twitch kegals to two minutes.   [name] will benefit from skilled PT to address these issues to decrease her episodes of incontinence.    OBJECTIVE IMPAIRMENTS: decreased strength and postural dysfunction.   ACTIVITY LIMITATIONS: hygiene/grooming    PERSONAL FACTORS: Age and Fitness  are also affecting patient's functional outcome.   REHAB POTENTIAL: Fair    CLINICAL DECISION MAKING: Evolving/moderate complexity  EVALUATION COMPLEXITY: Moderate   GOALS: Goals reviewed with patient? No  SHORT TERM  GOALS: Target date: 09/28/22   PT to be completing her HEP to improve her abdominal strength Baseline: Goal status: IN PROGRESS  2.  Pt to only have to use 3 pads a day for her incontinence issues Baseline:  Goal status: IN PROGRESS    LONG TERM GOALS: Target date: 10/12/2022  PT to be completing an advanced HEP in order to improve her abdominal strength Baseline:  Goal status: IN PROGRESS  2.  PT to only be using 2 pads a day for her incontinence issues.  Baseline:  Goal status: IN PROGRESS    PLAN:  PT FREQUENCY: 1x/week  PT DURATION: 4 weeks  PLANNED INTERVENTIONS: Therapeutic exercises, Therapeutic activity, Patient/Family education, and Self Care  PLAN FOR NEXT SESSION: progress to heel slides, hip abduction, hip extension, all four if able    Rayetta Humphrey, PT CLT [phone]  09/24/2022, 5:25 PM

## 2022-10-08 ENCOUNTER — Ambulatory Visit (HOSPITAL_COMMUNITY): Payer: HMO | Attending: Urology | Admitting: Physical Therapy

## 2022-10-08 DIAGNOSIS — N3946 Mixed incontinence: Secondary | ICD-10-CM | POA: Diagnosis not present

## 2022-10-08 NOTE — Therapy (Signed)
OUTPATIENT PHYSICAL THERAPY FEMALE PELVIC treatment    Patient Name: [name] MRN: [id number] DOB:23-May-1941, 82 y.o., female Today's Date: 10/08/2022 . END OF SESSION:   PT End of Session - 10/08/22 0945    Visit Number 3    Number of Visits 4    Date for PT Re-Evaluation 10/14/22    Authorization - Visit Number 3    Authorization - Number of Visits 4    Progress Note Due on Visit 4    PT Start Time 0907    PT Stop Time 0945    PT Time Calculation (min) 38 min    Activity Tolerance Patient tolerated treatment well    Behavior During Therapy WFL for tasks assessed/performed                    Past Medical History:  Diagnosis Date   Arthritis    Bladder spasms    "spastic bladder" wears pads   Cancer ([place])    Skin cancer -scalp and eyebrow"basal cell"   Complication of anesthesia    , woke up too early from surgery and pt had floppy fish syndrome   GERD (gastroesophageal reflux disease)    occ. frequent "hiccoughs" after "eating, indigestion"   Hyperlipemia    Hypertension    Hypothyroidism    Macular degeneration    Trigger finger, left    alternate fingers affected. -Occ. shooting pain.   Wears glasses    Past Surgical History:  Procedure Laterality Date   ABDOMINAL HYSTERECTOMY  09/07/1987   APPENDECTOMY     CARDIAC CATHETERIZATION  09/06/2004   normal-done for work up pre hip surgeries   CARPAL TUNNEL RELEASE Left    CHOLECYSTECTOMY  09/07/1995   JOINT REPLACEMENT     BTHA   left breast lumpectomy      POLYPECTOMY Bilateral 06/26/2013   Procedure: BILATERAL EXCISION OF NASAL POLYPS ;  Surgeon: Ascencion Dike, MD;  Location: [place];  Service: ENT;  Laterality: Bilateral;   SEPTOPLASTY N/A 06/26/2013   Procedure: AND SEPTOPLASTY;  Surgeon: Ascencion Dike, MD;  Location: [place];  Service: ENT;  Laterality: N/A;   THYROIDECTOMY  09/06/1992   TONSILLECTOMY     TOTAL HIP ARTHROPLASTY  09/06/2004   right   TOTAL HIP  ARTHROPLASTY  09/06/2004   left   TOTAL KNEE ARTHROPLASTY Right 12/13/2014   Procedure: RIGHT TOTAL KNEE ARTHROPLASTY;  Surgeon: Gaynelle Arabian, MD;  Location: [facility];  Service: Orthopedics;  Laterality: Right;   TOTAL KNEE ARTHROPLASTY Left 07/13/2021   Procedure: TOTAL KNEE ARTHROPLASTY;  Surgeon: Gaynelle Arabian, MD;  Location: [facility];  Service: Orthopedics;  Laterality: Left;   Patient Active Problem List   Diagnosis Date Noted   Primary osteoarthritis of left knee 07/13/2021   History of chest pain 03/20/2019   History of breast cancer 08/14/2018   Obesity (BMI 30-39.9) 08/14/2018   Bilateral lower extremity edema 02/14/2018   Essential hypertension 02/14/2018   Hyperlipidemia 02/14/2018   Family history of heart disease 02/14/2018   Onychomycosis 08/26/2016   OA (osteoarthritis) of knee 12/13/2014    PCP: Sharilyn Sites  REFERRING PROVIDER: Franchot Gallo, MD  REFERRING DIAG: N39.46 (ICD-10-CM) - Mixed incontinence  THERAPY DIAG:  Mixed incontinence  Rationale for Evaluation and Treatment: Rehabilitation  ONSET DATE:chronic  SUBJECTIVE STATEMENT:  Pt still has not been consistent doing her exercises.   PAIN:  Are you having pain? Back 5/10   PRECAUTIONS: None  WEIGHT BEARING RESTRICTIONS: No  FALLS:  Has patient fallen in last 6 months? No  OCCUPATION: retired  PLOF: Independent  PATIENT GOALS: Go to the bathroom less, less leakage    BOWEL MOVEMENT: Pain with bowel movement: No  Fully empty rectum: Yes:   Leakage: No Pads: No Fiber supplement: Yes: natural prunes   URINATION: Pain with urination: No Fully empty bladder: No Stream: Strong Urgency: Yes:   Frequency: 9 times or more a day  Leakage: Urge to void, Walking to the bathroom, Coughing, and Sneezing Pads: Yes:  3 or more   No children   PROLAPSE: no  OBJECTIVE:   POSTURE: rounded shoulders, forward head, decreased lumbar lordosis, increased thoracic kyphosis, and protruding abdominal   Abdominal mm 2/5   TODAY'S TREATMENT:                                                                                                                              DATE:  10/08/2022 Theraband for improved abdominal strength: Anchored at door Rt Shoulder flex then Lt with kegal and abdominal isometric x 10 Horizontal abduction at hip level B x 10 Theraband anchored above door Rt hand to left hip x 10 Lt  hand to Rt hip x 10 B to hips  x 10  Standing: Hip abduction with pelvic contraction x 10 B  10/01/22 Supine: Heel slide x 15 Scapular retraction now go higher x 5  instead crunch as this caused neck pain Bent knee raise x 10 Rt hip abduction x 10  Fast twitch kegal x 2 minutes contract 2" relax 4" RT Side-lying for  LT hip abduction x 10  09/24/2022; Sitting: Kegals fast twitch Hold 2" rest 4" x 10 Supine: Abdominal set x 10 Head curl 10 Heel slide x 10   09/14/2022:  EVAL  Sitting:  Abdominal contraction 5" x 10 Kegal: Fast twitch:  2" hold , 4" relax x 10 Slow :  45" hold x 1 Education on posture as well as timing bathroom breaks.   PATIENT EDUCATION:  Education details: HEP Person educated: Patient Education method: Consulting civil engineer, Media planner, Corporate treasurer cues, and Handouts Education comprehension: verbalized understanding, returned demonstration, and tactile cues required  HOME EXERCISE PROGRAM: HEP  ASSESSMENT:  CLINICAL IMPRESSION:  Therapist instructed pt in theraband exercises for improved core/abdominal strength.  Pt unable to complete side lying activity due to hip pain therefore modified to standing position.  PT able to complete with minimal difficultuy.     Ms. Maiolo will benefit from skilled PT to address these issues to decrease her episodes of incontinence.    OBJECTIVE  IMPAIRMENTS: decreased strength and postural dysfunction.   ACTIVITY LIMITATIONS: hygiene/grooming    PERSONAL FACTORS: Age and Fitness are also affecting patient's functional outcome.   REHAB POTENTIAL: Fair  CLINICAL DECISION MAKING: Evolving/moderate complexity  EVALUATION COMPLEXITY: Moderate   GOALS: Goals reviewed with patient? No  SHORT TERM GOALS: Target date: 09/28/22   PT to be completing her HEP to improve her abdominal strength Baseline: Goal status: IN PROGRESS  2.  Pt to only have to use 3 pads a day for her incontinence issues Baseline:  Goal status: IN PROGRESS    LONG TERM GOALS: Target date: 10/12/2022  PT to be completing an advanced HEP in order to improve her abdominal strength Baseline:  Goal status: IN PROGRESS  2.  PT to only be using 2 pads a day for her incontinence issues.  Baseline:  Goal status: IN PROGRESS    PLAN:   PT FREQUENCY: 1x/week  PT DURATION: 4 weeks  PLANNED INTERVENTIONS: Therapeutic exercises, Therapeutic activity, Patient/Family education, and Self Care  PLAN FOR NEXT SESSION: progress to heel slides, hip abduction, hip extension, all four if able    Rayetta Humphrey, PT CLT [phone]  10/08/2022, 9:48 AM

## 2022-10-14 DIAGNOSIS — Z9841 Cataract extraction status, right eye: Secondary | ICD-10-CM | POA: Diagnosis not present

## 2022-10-14 DIAGNOSIS — H53143 Visual discomfort, bilateral: Secondary | ICD-10-CM | POA: Diagnosis not present

## 2022-10-14 DIAGNOSIS — H3530 Unspecified macular degeneration: Secondary | ICD-10-CM | POA: Diagnosis not present

## 2022-10-14 DIAGNOSIS — H26492 Other secondary cataract, left eye: Secondary | ICD-10-CM | POA: Diagnosis not present

## 2022-10-14 DIAGNOSIS — Z961 Presence of intraocular lens: Secondary | ICD-10-CM | POA: Diagnosis not present

## 2022-10-14 DIAGNOSIS — H353131 Nonexudative age-related macular degeneration, bilateral, early dry stage: Secondary | ICD-10-CM | POA: Diagnosis not present

## 2022-10-14 DIAGNOSIS — H524 Presbyopia: Secondary | ICD-10-CM | POA: Diagnosis not present

## 2022-10-14 DIAGNOSIS — H52223 Regular astigmatism, bilateral: Secondary | ICD-10-CM | POA: Diagnosis not present

## 2022-10-15 ENCOUNTER — Ambulatory Visit (HOSPITAL_COMMUNITY): Payer: HMO | Admitting: Physical Therapy

## 2022-10-15 DIAGNOSIS — N3946 Mixed incontinence: Secondary | ICD-10-CM

## 2022-10-15 NOTE — Therapy (Signed)
OUTPATIENT PHYSICAL THERAPY FEMALE PELVIC treatment    Patient Name: [name] MRN: [id number] DOB:1941/03/22, 82 y.o., female Today's Date: 10/15/2022 PHYSICAL THERAPY DISCHARGE SUMMARY  Visits from Start of Care: 4  Current functional level related to goals / functional outcomes: Goals not met but pt noting that she can get to the bathroom at times now without incontinence   Remaining deficits: Urge incontinece   Education / Equipment: The fact that she will need to do these exercises for 3-6 months   Patient agrees to discharge. Patient goals were partially met. Patient is being discharged due to  Pt I in HEP.  END OF SESSION:   PT End of Session - 10/15/22 1510     Visit Number 4    Number of Visits 4    Date for PT Re-Evaluation 10/14/22    Authorization - Visit Number 4    Authorization - Number of Visits 4    Progress Note Due on Visit 4    PT Start Time 1430    PT Stop Time 1510    PT Time Calculation (min) 40 min    Activity Tolerance Patient tolerated treatment well    Behavior During Therapy WFL for tasks assessed/performed                   Past Medical History:  Diagnosis Date   Arthritis    Bladder spasms    "spastic bladder" wears pads   Cancer ([place])    Skin cancer -scalp and eyebrow"basal cell"   Complication of anesthesia    , woke up too early from surgery and pt had floppy fish syndrome   GERD (gastroesophageal reflux disease)    occ. frequent "hiccoughs" after "eating, indigestion"   Hyperlipemia    Hypertension    Hypothyroidism    Macular degeneration    Trigger finger, left    alternate fingers affected. -Occ. shooting pain.   Wears glasses    Past Surgical History:  Procedure Laterality Date   ABDOMINAL HYSTERECTOMY  09/07/1987   APPENDECTOMY     CARDIAC CATHETERIZATION  09/06/2004   normal-done for work up pre hip surgeries   CARPAL TUNNEL RELEASE Left    CHOLECYSTECTOMY  09/07/1995   JOINT REPLACEMENT     BTHA    left breast lumpectomy      POLYPECTOMY Bilateral 06/26/2013   Procedure: BILATERAL EXCISION OF NASAL POLYPS ;  Surgeon: Ascencion Dike, MD;  Location: [place];  Service: ENT;  Laterality: Bilateral;   SEPTOPLASTY N/A 06/26/2013   Procedure: AND SEPTOPLASTY;  Surgeon: Ascencion Dike, MD;  Location: [place];  Service: ENT;  Laterality: N/A;   THYROIDECTOMY  09/06/1992   TONSILLECTOMY     TOTAL HIP ARTHROPLASTY  09/06/2004   right   TOTAL HIP ARTHROPLASTY  09/06/2004   left   TOTAL KNEE ARTHROPLASTY Right 12/13/2014   Procedure: RIGHT TOTAL KNEE ARTHROPLASTY;  Surgeon: Gaynelle Arabian, MD;  Location: [facility];  Service: Orthopedics;  Laterality: Right;   TOTAL KNEE ARTHROPLASTY Left 07/13/2021   Procedure: TOTAL KNEE ARTHROPLASTY;  Surgeon: Gaynelle Arabian, MD;  Location: [facility];  Service: Orthopedics;  Laterality: Left;   Patient Active Problem List   Diagnosis Date Noted   Primary osteoarthritis of left knee 07/13/2021   History of chest pain 03/20/2019   History of breast cancer 08/14/2018   Obesity (BMI 30-39.9) 08/14/2018   Bilateral lower extremity edema 02/14/2018   Essential hypertension 02/14/2018  Hyperlipidemia 02/14/2018   Family history of heart disease 02/14/2018   Onychomycosis 08/26/2016   OA (osteoarthritis) of knee 12/13/2014    PCP: Sharilyn Sites  REFERRING PROVIDER: Franchot Gallo, MD  REFERRING DIAG: N39.46 (ICD-10-CM) - Mixed incontinence  THERAPY DIAG:  Mixed incontinence  Rationale for Evaluation and Treatment: Rehabilitation  ONSET DATE:chronic                                                                                                                                                                                     SUBJECTIVE STATEMENT:  Pt is not completing her exercises on a consistent basis, she states that she pulled a muscle and was out of order for 3 days. She has not noted any improvement.  She states that she  does not even feel like she has to go to the bathroom she just starts urinating.  She states that she is still going to the bathroom about 9 x a day and using 3 or more pads.   PAIN:  Are you having pain? Back 0/10   PRECAUTIONS: None  WEIGHT BEARING RESTRICTIONS: No  FALLS:  Has patient fallen in last 6 months? No  OCCUPATION: retired  PLOF: Independent  PATIENT GOALS: Go to the bathroom less, less leakage    BOWEL MOVEMENT: Pain with bowel movement: No  Fully empty rectum: Yes:   Leakage: No Pads: No Fiber supplement: Yes: natural prunes   URINATION: Pain with urination: No Fully empty bladder: No Stream: Strong Urgency: Yes:   Frequency: 9 times or more a day  Leakage: Urge to void, Walking to the bathroom, Coughing, and Sneezing Pads: Yes: 3 or more   No children   PROLAPSE: no  OBJECTIVE:   POSTURE: rounded shoulders, forward head, decreased lumbar lordosis, increased thoracic kyphosis, and protruding abdominal   Abdominal mm 2/5   TODAY'S TREATMENT:                                                                                                                              DATE:  10/15/2022 Sitting: Kegal: Quick flick 2" contract; 4" relax x  2 minutes Long hold:  30" Theraband: red Rt shoulder flexion x 10  Lt shoulder flexion x 10  Horizontal adduction at hip level as in putt putt Rt then Lt x 10 each  PNF2 (shoulder to opposite hip ) x 10 done on the Rt and Lt B shoulder extension x 10  B shoulder ER x 10      10/08/2022 Theraband for improved abdominal strength: Anchored at door Rt Shoulder flex then Lt with kegal and abdominal isometric x 10 Horizontal abduction at hip level B x 10 Theraband anchored above door Rt hand to left hip x 10 Lt  hand to Rt hip x 10 B to hips  x 10  Standing: Hip abduction with pelvic contraction x 10 B  10/01/22 Supine: Heel slide x 15 Scapular retraction now go higher x 5  instead crunch as this caused  neck pain Bent knee raise x 10 Rt hip abduction x 10  Fast twitch kegal x 2 minutes contract 2" relax 4" RT Side-lying for  LT hip abduction x 10  09/24/2022; Sitting: Kegals fast twitch Hold 2" rest 4" x 10 Supine: Abdominal set x 10 Head curl 10 Heel slide x 10   09/14/2022:  EVAL  Sitting:  Abdominal contraction 5" x 10 Kegal: Fast twitch:  2" hold , 4" relax x 10 Slow :  45" hold x 1 Education on posture as well as timing bathroom breaks.   PATIENT EDUCATION:  Education details: HEP Person educated: Patient Education method: Education officer, environmental, Corporate treasurer cues, and Handouts Education comprehension: verbalized understanding, returned demonstration, and tactile cues required  HOME EXERCISE PROGRAM: HEP  ASSESSMENT:  CLINICAL IMPRESSION:  Reviewed all exercises that pt had questions on.  Pt did better with longer theraband which was given to pt .  Pt verbalized understanding of exercises.  PT will be discharged to HEP with education that pt needs to complete exercises for at least 6 months.  OBJECTIVE IMPAIRMENTS: decreased strength and postural dysfunction.   ACTIVITY LIMITATIONS: hygiene/grooming    PERSONAL FACTORS: Age and Fitness are also affecting patient's functional outcome.   REHAB POTENTIAL: Fair    CLINICAL DECISION MAKING: Evolving/moderate complexity  EVALUATION COMPLEXITY: Moderate   GOALS: Goals reviewed with patient? No  SHORT TERM GOALS: Target date: 09/28/22   PT to be completing her HEP to improve her abdominal strength Baseline: Goal status: NOT MET  2.  Pt to only have to use 3 pads a day for her incontinence issues Baseline:  Goal status: NOT MET    LONG TERM GOALS: Target date: 10/12/2022  PT to be completing an advanced HEP in order to improve her abdominal strength Baseline:  Goal status: NOT MET  2.  PT to only be using 2 pads a day for her incontinence issues.  Baseline:  Goal status: NOT MET    PLAN:   PT  FREQUENCY: 1x/week  PT DURATION: 4 weeks  PLANNED INTERVENTIONS: Therapeutic exercises, Therapeutic activity, Patient/Family education, and Self Care  PLAN FOR NEXT SESSION: discharge Rayetta Humphrey, PT CLT [phone]  10/15/2022, [phone]

## 2022-11-09 ENCOUNTER — Other Ambulatory Visit: Payer: Self-pay

## 2022-11-09 MED ORDER — TOLTERODINE TARTRATE ER 4 MG PO CP24: 4.0000 mg | ORAL_CAPSULE | Freq: Every day | ORAL | 3 refills | Status: DC

## 2022-11-16 ENCOUNTER — Ambulatory Visit (HOSPITAL_COMMUNITY)
Admission: RE | Admit: 2022-11-16 | Discharge: 2022-11-16 | Disposition: A | Discharge status: Home / Self Care | Payer: HMO | Source: Ambulatory Visit | Attending: Family Medicine | Admitting: Family Medicine

## 2022-11-16 ENCOUNTER — Other Ambulatory Visit (HOSPITAL_COMMUNITY): Payer: Self-pay | Admitting: Family Medicine

## 2022-11-16 DIAGNOSIS — Z6838 Body mass index (BMI) 38.0-38.9, adult: Secondary | ICD-10-CM | POA: Diagnosis not present

## 2022-11-16 DIAGNOSIS — R059 Cough, unspecified: Secondary | ICD-10-CM

## 2022-11-16 DIAGNOSIS — R6889 Other general symptoms and signs: Secondary | ICD-10-CM | POA: Diagnosis not present

## 2022-11-16 DIAGNOSIS — E6609 Other obesity due to excess calories: Secondary | ICD-10-CM | POA: Diagnosis not present

## 2022-11-16 DIAGNOSIS — Z20828 Contact with and (suspected) exposure to other viral communicable diseases: Secondary | ICD-10-CM | POA: Diagnosis not present

## 2022-11-16 DIAGNOSIS — R079 Chest pain, unspecified: Secondary | ICD-10-CM | POA: Diagnosis not present

## 2022-11-23 ENCOUNTER — Ambulatory Visit: Payer: Medicare Other | Admitting: Urology

## 2022-12-06 DIAGNOSIS — J45909 Unspecified asthma, uncomplicated: Secondary | ICD-10-CM | POA: Diagnosis not present

## 2022-12-06 DIAGNOSIS — J0100 Acute maxillary sinusitis, unspecified: Secondary | ICD-10-CM | POA: Diagnosis not present

## 2022-12-06 DIAGNOSIS — Z6837 Body mass index (BMI) 37.0-37.9, adult: Secondary | ICD-10-CM | POA: Diagnosis not present

## 2022-12-06 DIAGNOSIS — I10 Essential (primary) hypertension: Secondary | ICD-10-CM | POA: Diagnosis not present

## 2022-12-23 DIAGNOSIS — L820 Inflamed seborrheic keratosis: Secondary | ICD-10-CM | POA: Diagnosis not present

## 2022-12-23 DIAGNOSIS — B078 Other viral warts: Secondary | ICD-10-CM | POA: Diagnosis not present

## 2022-12-23 DIAGNOSIS — L570 Actinic keratosis: Secondary | ICD-10-CM | POA: Diagnosis not present

## 2022-12-23 DIAGNOSIS — Z08 Encounter for follow-up examination after completed treatment for malignant neoplasm: Secondary | ICD-10-CM | POA: Diagnosis not present

## 2022-12-23 DIAGNOSIS — X32XXXD Exposure to sunlight, subsequent encounter: Secondary | ICD-10-CM | POA: Diagnosis not present

## 2022-12-23 DIAGNOSIS — D225 Melanocytic nevi of trunk: Secondary | ICD-10-CM | POA: Diagnosis not present

## 2022-12-23 DIAGNOSIS — Z85828 Personal history of other malignant neoplasm of skin: Secondary | ICD-10-CM | POA: Diagnosis not present

## 2023-01-17 NOTE — Progress Notes (Signed)
History of Present Illness: [name] is a 82 y.o. year old female is here for continued f/u of OAB.  At her visit 6 mos ago she was given a behavioral modification sheet and referred for PT mgmt of her sx's.  She has had improvement of her LUTS and leakage. Most days only uses 1-3 ppd.She is still on tolterodine. No recent UTI.    Past Medical History:  Diagnosis Date   Arthritis    Bladder spasms    "spastic bladder" wears pads   Cancer ([facility])    Skin cancer -scalp and eyebrow"basal cell"   Complication of anesthesia    , woke up too early from surgery and pt had floppy fish syndrome   GERD (gastroesophageal reflux disease)    occ. frequent "hiccoughs" after "eating, indigestion"   Hyperlipemia    Hypertension    Hypothyroidism    Macular degeneration    Trigger finger, left    alternate fingers affected. -Occ. shooting pain.   Wears glasses     Past Surgical History:  Procedure Laterality Date   ABDOMINAL HYSTERECTOMY  09/07/1987   APPENDECTOMY     CARDIAC CATHETERIZATION  09/06/2004   normal-done for work up pre hip surgeries   CARPAL TUNNEL RELEASE Left    CHOLECYSTECTOMY  09/07/1995   JOINT REPLACEMENT     BTHA   left breast lumpectomy      POLYPECTOMY Bilateral 06/26/2013   Procedure: BILATERAL EXCISION OF NASAL POLYPS ;  Surgeon: Darletta Moll, MD;  Location: [facility];  Service: ENT;  Laterality: Bilateral;   SEPTOPLASTY N/A 06/26/2013   Procedure: AND SEPTOPLASTY;  Surgeon: Darletta Moll, MD;  Location: [facility];  Service: ENT;  Laterality: N/A;   THYROIDECTOMY  09/06/1992   TONSILLECTOMY     TOTAL HIP ARTHROPLASTY  09/06/2004   right   TOTAL HIP ARTHROPLASTY  09/06/2004   left   TOTAL KNEE ARTHROPLASTY Right 12/13/2014   Procedure: RIGHT TOTAL KNEE ARTHROPLASTY;  Surgeon: Ollen Gross, MD;  Location: [facility];  Service: Orthopedics;  Laterality: Right;   TOTAL KNEE ARTHROPLASTY Left 07/13/2021   Procedure: TOTAL KNEE  ARTHROPLASTY;  Surgeon: Ollen Gross, MD;  Location: [facility];  Service: Orthopedics;  Laterality: Left;    Home Medications:  (Not in a hospital admission)   Allergies:  Allergies  Allergen Reactions   Feldene [Piroxicam] Other (See Comments)    CANT REMEMBER   Gabapentin Other (See Comments)    Gi upset   Ivp Dye [Iodinated Contrast Media] Other (See Comments)    CANT REMEMBER   Latex Other (See Comments)    Tickle nose   Sulfa Antibiotics     CANT REMEMBER   Naproxen Nausea And Vomiting   Vicodin [Hydrocodone-Acetaminophen] Nausea And Vomiting    No family history on file.  Social History:  reports that she has never smoked. She has never used smokeless tobacco. She reports that she does not drink alcohol and does not use drugs.  ROS: A complete review of systems was performed.  All systems are negative except for pertinent findings as noted.  Physical Exam:  Vital signs in last 24 hours: @VSRANGES @ General:  Alert and oriented, No acute distress HEENT: Normocephalic, atraumatic Neck: No JVD or lymphadenopathy Cardiovascular: Regular rate  Lungs: Normal inspiratory/expiratory excursion Neurologic: Grossly intact  I have reviewed prior pt notes  I have reviewed notes from referring/previous physicians  I have reviewed urinalysis results   Impression/Assessment:  OAB  with mixed incontinence, significantly improved with combination of tolterodine and physical therapy  Plan:  Continue tolterodine  I will have her come back in 1 year for recheck  Chelsea Aus 01/17/2023, 9:08 AM  Bertram Millard. Anapaola Kinsel MD

## 2023-01-18 ENCOUNTER — Encounter: Payer: Self-pay | Admitting: Urology

## 2023-01-18 ENCOUNTER — Ambulatory Visit (INDEPENDENT_AMBULATORY_CARE_PROVIDER_SITE_OTHER): Payer: HMO | Admitting: Urology

## 2023-01-18 VITALS — BP 131/82 | HR 76

## 2023-01-18 DIAGNOSIS — N3946 Mixed incontinence: Secondary | ICD-10-CM | POA: Diagnosis not present

## 2023-01-18 DIAGNOSIS — N3281 Overactive bladder: Secondary | ICD-10-CM

## 2023-01-18 LAB — URINALYSIS, ROUTINE W REFLEX MICROSCOPIC
Bilirubin, UA: NEGATIVE
Glucose, UA: NEGATIVE
Ketones, UA: NEGATIVE
Leukocytes,UA: NEGATIVE
Nitrite, UA: NEGATIVE
Protein,UA: NEGATIVE
RBC, UA: NEGATIVE
Specific Gravity, UA: 1.03 (ref 1.005–1.030)
Urobilinogen, Ur: 1 mg/dL (ref 0.2–1.0)
pH, UA: 5.5 (ref 5.0–7.5)

## 2023-01-18 LAB — BLADDER SCAN AMB NON-IMAGING: Scan Result: 0

## 2023-01-18 NOTE — Progress Notes (Signed)
Pt here today for bladder scan. Bladder was scanned and 0 was visualized.    Performed by Bertina Guthridge, CMA  

## 2023-02-25 DIAGNOSIS — E785 Hyperlipidemia, unspecified: Secondary | ICD-10-CM | POA: Diagnosis not present

## 2023-02-25 DIAGNOSIS — E039 Hypothyroidism, unspecified: Secondary | ICD-10-CM | POA: Diagnosis not present

## 2023-02-25 DIAGNOSIS — F3341 Major depressive disorder, recurrent, in partial remission: Secondary | ICD-10-CM | POA: Diagnosis not present

## 2023-02-25 DIAGNOSIS — I10 Essential (primary) hypertension: Secondary | ICD-10-CM | POA: Diagnosis not present

## 2023-02-25 DIAGNOSIS — Z6841 Body Mass Index (BMI) 40.0 and over, adult: Secondary | ICD-10-CM | POA: Diagnosis not present

## 2023-02-25 DIAGNOSIS — Z853 Personal history of malignant neoplasm of breast: Secondary | ICD-10-CM | POA: Diagnosis not present

## 2023-02-25 DIAGNOSIS — H3530 Unspecified macular degeneration: Secondary | ICD-10-CM | POA: Diagnosis not present

## 2023-02-25 DIAGNOSIS — M8580 Other specified disorders of bone density and structure, unspecified site: Secondary | ICD-10-CM | POA: Diagnosis not present

## 2023-02-25 DIAGNOSIS — R32 Unspecified urinary incontinence: Secondary | ICD-10-CM | POA: Diagnosis not present

## 2023-03-25 ENCOUNTER — Other Ambulatory Visit: Payer: Self-pay | Admitting: Cardiovascular Disease

## 2023-03-31 DIAGNOSIS — L570 Actinic keratosis: Secondary | ICD-10-CM | POA: Diagnosis not present

## 2023-03-31 DIAGNOSIS — L820 Inflamed seborrheic keratosis: Secondary | ICD-10-CM | POA: Diagnosis not present

## 2023-03-31 DIAGNOSIS — X32XXXD Exposure to sunlight, subsequent encounter: Secondary | ICD-10-CM | POA: Diagnosis not present

## 2023-04-29 DIAGNOSIS — Z6838 Body mass index (BMI) 38.0-38.9, adult: Secondary | ICD-10-CM | POA: Diagnosis not present

## 2023-04-29 DIAGNOSIS — E6609 Other obesity due to excess calories: Secondary | ICD-10-CM | POA: Diagnosis not present

## 2023-04-29 DIAGNOSIS — E039 Hypothyroidism, unspecified: Secondary | ICD-10-CM | POA: Diagnosis not present

## 2023-05-02 DIAGNOSIS — E039 Hypothyroidism, unspecified: Secondary | ICD-10-CM | POA: Diagnosis not present

## 2023-05-12 DIAGNOSIS — Z780 Asymptomatic menopausal state: Secondary | ICD-10-CM | POA: Diagnosis not present

## 2023-06-19 ENCOUNTER — Other Ambulatory Visit: Payer: Self-pay | Admitting: Urology

## 2023-06-24 ENCOUNTER — Other Ambulatory Visit: Payer: Self-pay

## 2023-06-24 MED ORDER — TOLTERODINE TARTRATE ER 4 MG PO CP24: 4.0000 mg | ORAL_CAPSULE | Freq: Every day | ORAL | 3 refills | Status: DC

## 2023-06-24 NOTE — Telephone Encounter (Signed)
Patient called to request Tolterodine to be sent to [facility] due to increase of cost at [employer].

## 2023-07-15 DIAGNOSIS — I10 Essential (primary) hypertension: Secondary | ICD-10-CM | POA: Diagnosis not present

## 2023-07-15 DIAGNOSIS — Z0001 Encounter for general adult medical examination with abnormal findings: Secondary | ICD-10-CM | POA: Diagnosis not present

## 2023-07-15 DIAGNOSIS — E039 Hypothyroidism, unspecified: Secondary | ICD-10-CM | POA: Diagnosis not present

## 2023-07-15 DIAGNOSIS — M810 Age-related osteoporosis without current pathological fracture: Secondary | ICD-10-CM | POA: Diagnosis not present

## 2023-07-15 DIAGNOSIS — E6609 Other obesity due to excess calories: Secondary | ICD-10-CM | POA: Diagnosis not present

## 2023-07-15 DIAGNOSIS — Z6838 Body mass index (BMI) 38.0-38.9, adult: Secondary | ICD-10-CM | POA: Diagnosis not present

## 2023-07-15 DIAGNOSIS — Z1331 Encounter for screening for depression: Secondary | ICD-10-CM | POA: Diagnosis not present

## 2023-07-15 DIAGNOSIS — E119 Type 2 diabetes mellitus without complications: Secondary | ICD-10-CM | POA: Diagnosis not present

## 2023-07-25 DIAGNOSIS — Z08 Encounter for follow-up examination after completed treatment for malignant neoplasm: Secondary | ICD-10-CM | POA: Diagnosis not present

## 2023-07-25 DIAGNOSIS — R92323 Mammographic fibroglandular density, bilateral breasts: Secondary | ICD-10-CM | POA: Diagnosis not present

## 2023-07-25 DIAGNOSIS — Z9889 Other specified postprocedural states: Secondary | ICD-10-CM | POA: Diagnosis not present

## 2023-07-25 DIAGNOSIS — Z85828 Personal history of other malignant neoplasm of skin: Secondary | ICD-10-CM | POA: Diagnosis not present

## 2023-07-25 DIAGNOSIS — Z1231 Encounter for screening mammogram for malignant neoplasm of breast: Secondary | ICD-10-CM | POA: Diagnosis not present

## 2023-07-25 DIAGNOSIS — M85832 Other specified disorders of bone density and structure, left forearm: Secondary | ICD-10-CM | POA: Diagnosis not present

## 2023-07-25 DIAGNOSIS — Z853 Personal history of malignant neoplasm of breast: Secondary | ICD-10-CM | POA: Diagnosis not present

## 2023-07-25 DIAGNOSIS — C50512 Malignant neoplasm of lower-outer quadrant of left female breast: Secondary | ICD-10-CM | POA: Diagnosis not present

## 2023-08-21 ENCOUNTER — Other Ambulatory Visit: Payer: Self-pay | Admitting: Cardiovascular Disease

## 2023-08-22 ENCOUNTER — Encounter: Payer: Self-pay | Admitting: Cardiovascular Disease

## 2023-08-22 ENCOUNTER — Ambulatory Visit: Payer: HMO | Attending: Cardiovascular Disease | Admitting: Cardiovascular Disease

## 2023-08-22 VITALS — BP 118/72 | HR 70 | Ht 60.0 in | Wt 214.0 lb

## 2023-08-22 DIAGNOSIS — Z8249 Family history of ischemic heart disease and other diseases of the circulatory system: Secondary | ICD-10-CM

## 2023-08-22 DIAGNOSIS — I10 Essential (primary) hypertension: Secondary | ICD-10-CM | POA: Diagnosis not present

## 2023-08-22 DIAGNOSIS — Z87898 Personal history of other specified conditions: Secondary | ICD-10-CM

## 2023-08-22 DIAGNOSIS — E782 Mixed hyperlipidemia: Secondary | ICD-10-CM

## 2023-08-22 NOTE — Addendum Note (Signed)
Addended by: Bernita Buffy on: 08/22/2023 10:32 AM   Modules accepted: Orders

## 2023-08-22 NOTE — Patient Instructions (Signed)

## 2023-08-22 NOTE — Assessment & Plan Note (Signed)
History of essential hypertension her blood pressure measured today at 118/72.  She is on hydrochlorothiazide, losartan and metoprolol

## 2023-08-22 NOTE — Progress Notes (Signed)
08/22/2023 Abigail ACQUISTO   [birth year]  [id number]  Primary Physician Assunta Found, MD Primary Cardiologist: Runell Gess MD Milagros Loll, [place], [place]  HPI:  [name] is a 82 y.o.  mildly overweight single Caucasian female with no children referred by Lenise Herald, PA-C to evaluate bilateral lower extremity edema.  I last saw her in the office 08/24/2022.  Her mother [name] was also a patient of mine who unfortunately passed away at age 53 on 04-11-20.  She does have a history of treated hypertension and hyperlipidemia.  She is never had a heart attack or stroke.  Her mother does have CAD and she is my patient.  She denies chest pain or shortness of breath.  She is had lower extremity edema for the last several weeks with a recent BMP drawn by her PCP of 144.  She was begun on as needed Lasix which has improved her swelling somewhat.   She developed some chest pain occurring several times a week lasting minutes at a time.  I did perform Myoview stress testing 04/04/2019 which was low risk and nonischemic.   She underwent left total knee replacement by Dr. Lequita Halt 07/13/2021 which she is cleared for at low risk.  She has had bilateral total hip replacements back in 06 and right total knee replacement in 2016.  She underwent successful left total knee replacement 07/13/2021.  Since I saw her a year ago she is remained stable.  She denies chest pain or shortness of breath.     Current Meds  Medication Sig   calcium citrate (CALCITRATE - DOSED IN MG ELEMENTAL CALCIUM) 950 (200 Ca) MG tablet Take 200 tablets by mouth 2 (two) times daily.   hydrochlorothiazide (MICROZIDE) 12.5 MG capsule TAKE 1 CAPSULE BY MOUTH EVERY DAY   levothyroxine (SYNTHROID, LEVOTHROID) 125 MCG tablet Take 125 mcg by mouth daily before breakfast.   losartan (COZAAR) 50 MG tablet TAKE 1 TABLET BY MOUTH EVERY DAY   metoprolol tartrate (LOPRESSOR) 50 MG tablet Take 50 mg by mouth 2 (two) times daily.    Polyethyl Glycol-Propyl Glycol (SYSTANE) 0.4-0.3 % SOLN Place 1 application into both eyes daily as needed (Dry eye).   simvastatin (ZOCOR) 10 MG tablet Take 10 mg by mouth at bedtime.   tolterodine (DETROL LA) 4 MG 24 hr capsule TAKE 1 CAPSULE BY MOUTH DAILY   Vitamins A & D (VITAMIN A & D) ointment Apply 1 application topically daily as needed (Gaulding (Skin Chafing)).     Allergies  Allergen Reactions   Feldene [Piroxicam] Other (See Comments)    CANT REMEMBER   Gabapentin Other (See Comments)    Gi upset   Ivp Dye [Iodinated Contrast Media] Other (See Comments)    CANT REMEMBER   Latex Other (See Comments)    Tickle nose   Sulfa Antibiotics     CANT REMEMBER   Naproxen Nausea And Vomiting   Vicodin [Hydrocodone-Acetaminophen] Nausea And Vomiting    Social History   Socioeconomic History   Marital status: Single    Spouse name: Not on file   Number of children: Not on file   Years of education: Not on file   Highest education level: Not on file  Occupational History   Not on file  Tobacco Use   Smoking status: Never   Smokeless tobacco: Never  Vaping Use   Vaping status: Never Used  Substance and Sexual Activity   Alcohol use: Never   Drug use:  No   Sexual activity: Not Currently  Other Topics Concern   Not on file  Social History Narrative   Not on file   Social Drivers of Health   Financial Resource Strain: Low Risk  (05/07/2022)   Overall Financial Resource Strain (CARDIA)    Difficulty of Paying Living Expenses: Not hard at all  Food Insecurity: Not on file  Transportation Needs: No Transportation Needs (05/07/2022)   PRAPARE - Administrator, Civil Service (Medical): No    Lack of Transportation (Non-Medical): No  Physical Activity: Not on file  Stress: Not on file  Social Connections: Not on file  Intimate Partner Violence: Not on file     Review of Systems: General: negative for chills, fever, night sweats or weight changes.   Cardiovascular: negative for chest pain, dyspnea on exertion, edema, orthopnea, palpitations, paroxysmal nocturnal dyspnea or shortness of breath Dermatological: negative for rash Respiratory: negative for cough or wheezing Urologic: negative for hematuria Abdominal: negative for nausea, vomiting, diarrhea, bright red blood per rectum, melena, or hematemesis Neurologic: negative for visual changes, syncope, or dizziness All other systems reviewed and are otherwise negative except as noted above.    Blood pressure 118/72, pulse 70, height 5' (1.524 m), weight 214 lb (97.1 kg).  General appearance: alert and no distress Neck: no adenopathy, no carotid bruit, no JVD, supple, symmetrical, trachea midline, and thyroid not enlarged, symmetric, no tenderness/mass/nodules Lungs: clear to auscultation bilaterally Heart: regular rate and rhythm, S1, S2 normal, no murmur, click, rub or gallop Extremities: extremities normal, atraumatic, no cyanosis or edema Pulses: 2+ and symmetric Skin: Skin color, texture, turgor normal. No rashes or lesions Neurologic: Grossly normal  EKG EKG Interpretation Date/Time:  Monday August 22 2023 09:59:45 EST Ventricular Rate:  70 PR Interval:  174 QRS Duration:  98 QT Interval:  372 QTC Calculation: 401 R Axis:   -12  Text Interpretation: Normal sinus rhythm Moderate voltage criteria for LVH, may be normal variant ( R in aVL , Cornell product ) Cannot rule out Anterior infarct , age undetermined When compared with ECG of 14-Dec-2020 15:41, No significant change was found Confirmed by Nanetta Batty [id number]) on 08/22/2023 10:17:46 AM    ASSESSMENT AND PLAN:   Essential hypertension History of essential hypertension her blood pressure measured today at 118/72.  She is on hydrochlorothiazide, losartan and metoprolol  Hyperlipidemia History of hide lipidemia on statin therapy with lipid profile performed 07/16/2023 revealing a total cholesterol 143, LDL 78  and HDL 44.     Runell Gess MD FACP,FACC,FAHA, [facility] 08/22/2023 10:25 AM

## 2023-08-22 NOTE — Assessment & Plan Note (Signed)
History of hide lipidemia on statin therapy with lipid profile performed 07/16/2023 revealing a total cholesterol 143, LDL 78 and HDL 44.

## 2023-09-16 ENCOUNTER — Other Ambulatory Visit: Payer: Self-pay | Admitting: Cardiovascular Disease

## 2023-10-24 DIAGNOSIS — M810 Age-related osteoporosis without current pathological fracture: Secondary | ICD-10-CM | POA: Diagnosis not present

## 2023-10-24 DIAGNOSIS — J45909 Unspecified asthma, uncomplicated: Secondary | ICD-10-CM | POA: Diagnosis not present

## 2023-10-24 DIAGNOSIS — I10 Essential (primary) hypertension: Secondary | ICD-10-CM | POA: Diagnosis not present

## 2023-10-24 DIAGNOSIS — E6609 Other obesity due to excess calories: Secondary | ICD-10-CM | POA: Diagnosis not present

## 2023-10-24 DIAGNOSIS — Z6838 Body mass index (BMI) 38.0-38.9, adult: Secondary | ICD-10-CM | POA: Diagnosis not present

## 2023-10-24 DIAGNOSIS — J0100 Acute maxillary sinusitis, unspecified: Secondary | ICD-10-CM | POA: Diagnosis not present

## 2023-11-18 DIAGNOSIS — H3530 Unspecified macular degeneration: Secondary | ICD-10-CM | POA: Diagnosis not present

## 2023-11-18 DIAGNOSIS — H26492 Other secondary cataract, left eye: Secondary | ICD-10-CM | POA: Diagnosis not present

## 2023-11-18 DIAGNOSIS — H52223 Regular astigmatism, bilateral: Secondary | ICD-10-CM | POA: Diagnosis not present

## 2023-11-18 DIAGNOSIS — H353131 Nonexudative age-related macular degeneration, bilateral, early dry stage: Secondary | ICD-10-CM | POA: Diagnosis not present

## 2023-12-13 ENCOUNTER — Ambulatory Visit: Admission: EM | Admit: 2023-12-13 | Discharge: 2023-12-13 | Disposition: A | Discharge status: Home / Self Care

## 2023-12-13 ENCOUNTER — Encounter: Payer: Self-pay | Admitting: Emergency Medicine

## 2023-12-13 DIAGNOSIS — S51812A Laceration without foreign body of left forearm, initial encounter: Secondary | ICD-10-CM | POA: Diagnosis not present

## 2023-12-13 DIAGNOSIS — Z23 Encounter for immunization: Secondary | ICD-10-CM

## 2023-12-13 MED ORDER — TETANUS-DIPHTH-ACELL PERTUSSIS 5-2.5-18.5 LF-MCG/0.5 IM SUSY
0.5000 mL | PREFILLED_SYRINGE | Freq: Once | INTRAMUSCULAR | Status: AC
  Administered 2023-12-13: 0.5 mL via INTRAMUSCULAR

## 2023-12-13 MED ORDER — MUPIROCIN 2 % EX OINT: 1.0000 | TOPICAL_OINTMENT | Freq: Two times a day (BID) | CUTANEOUS | 0 refills | Status: AC

## 2023-12-13 NOTE — ED Triage Notes (Signed)
 Hit left forearm on chair rail yesterday.  Skin tear to left forearm

## 2023-12-13 NOTE — Discharge Instructions (Addendum)
 Clean the skin tear on your left forearm twice daily with soap and water.  Apply a thin layer of the mupirocin ointment twice daily over top of the skin tear and cover with nonadherent gauze and tape.  Continue this until the area heals.  Seek care if symptoms or signs of infection develop such as redness, swelling, or warmth around the cut.  Tdap was updated today.

## 2023-12-14 NOTE — ED Provider Notes (Signed)
 [facility]    CSN: [id number] Arrival date & time: 12/13/23  1728      History   Chief Complaint No chief complaint on file.   HPI [name] is a 83 y.o. female.   Patient presents today with a cut to her left forearm that she sustained yesterday on a chair rail inside of her home.  Reports she covered the area with a bandage and applied a stretchy compression bandage which helped with the bleeding.  She reports the area was painful until she took off the stretchy compression bandage.  No fevers or nausea/vomiting.  She reports the bleeding has been well-controlled with the bandage that she applied yesterday.  Unknown last tetanus shot.  She does not know how to take care of the area to keep it clean and free of infection.    Past Medical History:  Diagnosis Date   Arthritis    Bladder spasms    "spastic bladder" wears pads   Cancer (HCC)    Skin cancer -scalp and eyebrow"basal cell"   Complication of anesthesia    , woke up too early from surgery and pt had floppy fish syndrome   GERD (gastroesophageal reflux disease)    occ. frequent "hiccoughs" after "eating, indigestion"   Hyperlipemia    Hypertension    Hypothyroidism    Macular degeneration    Trigger finger, left    alternate fingers affected. -Occ. shooting pain.   Wears glasses     Patient Active Problem List   Diagnosis Date Noted   Primary osteoarthritis of left knee 07/13/2021   History of chest pain 03/20/2019   History of breast cancer 08/14/2018   Obesity (BMI 30-39.9) 08/14/2018   Bilateral lower extremity edema 02/14/2018   Essential hypertension 02/14/2018   Hyperlipidemia 02/14/2018   Family history of heart disease 02/14/2018   Onychomycosis 08/26/2016   OA (osteoarthritis) of knee 12/13/2014    Past Surgical History:  Procedure Laterality Date   ABDOMINAL HYSTERECTOMY  09/07/1987   APPENDECTOMY     CARDIAC CATHETERIZATION  09/06/2004   normal-done for work up pre hip  surgeries   CARPAL TUNNEL RELEASE Left    CHOLECYSTECTOMY  09/07/1995   JOINT REPLACEMENT     BTHA   left breast lumpectomy      POLYPECTOMY Bilateral 06/26/2013   Procedure: BILATERAL EXCISION OF NASAL POLYPS ;  Surgeon: Darletta Moll, MD;  Location: [facility];  Service: ENT;  Laterality: Bilateral;   SEPTOPLASTY N/A 06/26/2013   Procedure: AND SEPTOPLASTY;  Surgeon: Darletta Moll, MD;  Location: [facility];  Service: ENT;  Laterality: N/A;   THYROIDECTOMY  09/06/1992   TONSILLECTOMY     TOTAL HIP ARTHROPLASTY  09/06/2004   right   TOTAL HIP ARTHROPLASTY  09/06/2004   left   TOTAL KNEE ARTHROPLASTY Right 12/13/2014   Procedure: RIGHT TOTAL KNEE ARTHROPLASTY;  Surgeon: Ollen Gross, MD;  Location: [facility];  Service: Orthopedics;  Laterality: Right;   TOTAL KNEE ARTHROPLASTY Left 07/13/2021   Procedure: TOTAL KNEE ARTHROPLASTY;  Surgeon: Ollen Gross, MD;  Location: [facility];  Service: Orthopedics;  Laterality: Left;    OB History   No obstetric history on file.      Home Medications    Prior to Admission medications   Medication Sig Start Date End Date Taking? Authorizing Provider  cholecalciferol (VITAMIN D3) 25 MCG (1000 UNIT) tablet Take 1,000 Units by mouth daily.   Yes [provider]  Multiple Vitamins-Minerals (PRESERVISION AREDS 2 PO) Take by mouth.   Yes [provider]  mupirocin ointment (BACTROBAN) 2 % Apply 1 Application topically 2 (two) times daily for 7 days. 12/13/23 12/20/23 Yes Valentino Nose, NP  calcium citrate (CALCITRATE - DOSED IN MG ELEMENTAL CALCIUM) 950 (200 Ca) MG tablet Take 200 tablets by mouth 2 (two) times daily.    [provider]  hydrochlorothiazide (MICROZIDE) 12.5 MG capsule TAKE 1 CAPSULE BY MOUTH EVERY DAY 09/16/23   Runell Gess, MD  levothyroxine (SYNTHROID, LEVOTHROID) 125 MCG tablet Take 125 mcg by mouth daily before breakfast.    [provider]  losartan (COZAAR) 50 MG  tablet TAKE 1 TABLET BY MOUTH EVERY DAY 08/22/23   Runell Gess, MD  metoprolol tartrate (LOPRESSOR) 50 MG tablet Take 50 mg by mouth 2 (two) times daily.    [provider]  Polyethyl Glycol-Propyl Glycol (SYSTANE) 0.4-0.3 % SOLN Place 1 application into both eyes daily as needed (Dry eye).    [provider]  simvastatin (ZOCOR) 10 MG tablet Take 10 mg by mouth at bedtime.    [provider]  tolterodine (DETROL LA) 4 MG 24 hr capsule TAKE 1 CAPSULE BY MOUTH DAILY 07/08/23   Marcine Matar, MD  Vitamins A & D (VITAMIN A & D) ointment Apply 1 application topically daily as needed (Gaulding (Skin Chafing)).    [provider]    Family History History reviewed. No pertinent family history.  Social History Social History   Tobacco Use   Smoking status: Never   Smokeless tobacco: Never  Vaping Use   Vaping status: Never Used  Substance Use Topics   Alcohol use: Never   Drug use: No     Allergies   Feldene [piroxicam], Gabapentin, Ivp dye [iodinated contrast media], Latex, Sulfa antibiotics, Naproxen, and Vicodin [hydrocodone-acetaminophen]   Review of Systems Review of Systems Per HPI  Physical Exam Triage Vital Signs ED Triage Vitals  Encounter Vitals Group     BP 12/13/23 1749 137/80     Systolic BP Percentile --      Diastolic BP Percentile --      Pulse Rate 12/13/23 1749 81     Resp 12/13/23 1749 18     Temp 12/13/23 1749 97.9 F (36.6 C)     Temp Source 12/13/23 1749 Oral     SpO2 12/13/23 1749 98 %     Weight --      Height --      Head Circumference --      Peak Flow --      Pain Score 12/13/23 1751 0     Pain Loc --      Pain Education --      Exclude from Growth Chart --    No data found.  Updated Vital Signs BP 137/80 (BP Location: Right Arm)   Pulse 81   Temp 97.9 F (36.6 C) (Oral)   Resp 18   SpO2 98%   Visual Acuity Right Eye Distance:   Left Eye Distance:   Bilateral Distance:    Right Eye  Near:   Left Eye Near:    Bilateral Near:     Physical Exam Vitals and nursing note reviewed.  Constitutional:      General: She is not in acute distress.    Appearance: Normal appearance. She is not toxic-appearing.  HENT:     Mouth/Throat:     Mouth: Mucous membranes are moist.  Pharynx: Oropharynx is clear.  Pulmonary:     Effort: Pulmonary effort is normal. No respiratory distress.  Skin:    General: Skin is warm and dry.     Capillary Refill: Capillary refill takes less than 2 seconds.     Findings: Wound present.          Comments: Triangular-shaped skin tear noted to left posterior forearm and approximately area marked.  There is surrounding bruising, however no drainage, redness, tenderness to touch, or fluctuance.  Neurological:     Mental Status: She is alert and oriented to person, place, and time.  Psychiatric:        Behavior: Behavior is cooperative.      UC Treatments / Results  Labs (all labs ordered are listed, but only abnormal results are displayed) Labs Reviewed - No data to display  EKG   Radiology No results found.  Procedures Procedures (including critical care time)  Medications Ordered in UC Medications  Tdap (BOOSTRIX) injection 0.5 mL (0.5 mLs Intramuscular Given 12/13/23 1848)    Initial Impression / Assessment and Plan / UC Course  I have reviewed the triage vital signs and the nursing notes.  Pertinent labs & imaging results that were available during my care of the patient were reviewed by me and considered in my medical decision making (see chart for details).   Patient is well-appearing, normotensive, afebrile, not tachycardic, not tachypneic, oxygenating well on room air.    1. Skin tear of left forearm without complication, initial encounter We discussed wound closure is not indicated for this type of wound Wound care discussed with patient, start mupirocin ointment twice daily after cleaning with soap and water and cover  with nonadherent gauze and tape until it heals Return and ER precautions discussed with patient Tdap updated today  The patient was given the opportunity to ask questions.  All questions answered to their satisfaction.  The patient is in agreement to this plan.    Final Clinical Impressions(s) / UC Diagnoses   Final diagnoses:  Skin tear of left forearm without complication, initial encounter     Discharge Instructions      Clean the skin tear on your left forearm twice daily with soap and water.  Apply a thin layer of the mupirocin ointment twice daily over top of the skin tear and cover with nonadherent gauze and tape.  Continue this until the area heals.  Seek care if symptoms or signs of infection develop such as redness, swelling, or warmth around the cut.  Tdap was updated today.    ED Prescriptions     Medication Sig Dispense Auth. Provider   mupirocin ointment (BACTROBAN) 2 % Apply 1 Application topically 2 (two) times daily for 7 days. 22 g Valentino Nose, NP      PDMP not reviewed this encounter.   Valentino Nose, NP 12/14/23 1401

## 2023-12-17 ENCOUNTER — Other Ambulatory Visit: Payer: Self-pay | Admitting: Urology

## 2024-01-23 NOTE — Progress Notes (Signed)
 History of Present Illness: [name] is a 83 y.o. year old female is here for continued f/u of OAB.  She has a long history of treatment prior to seeing us  here several years ago.  She has been on several medications, most recently tolterodine .  She tolerates this well.  She has had several rounds with PTNS.  She still has urinary frequency and urgency.  Somewhat limited mobility.    Past Medical History:  Diagnosis Date   Arthritis    Bladder spasms    "spastic bladder" wears pads   Cancer (HCC)    Skin cancer -scalp and eyebrow"basal cell"   Complication of anesthesia    , woke up too early from surgery and pt had floppy fish syndrome   GERD (gastroesophageal reflux disease)    occ. frequent "hiccoughs" after "eating, indigestion"   Hyperlipemia    Hypertension    Hypothyroidism    Macular degeneration    Trigger finger, left    alternate fingers affected. -Occ. shooting pain.   Wears glasses     Past Surgical History:  Procedure Laterality Date   ABDOMINAL HYSTERECTOMY  09/07/1987   APPENDECTOMY     CARDIAC CATHETERIZATION  09/06/2004   normal-done for work up pre hip surgeries   CARPAL TUNNEL RELEASE Left    CHOLECYSTECTOMY  09/07/1995   JOINT REPLACEMENT     BTHA   left breast lumpectomy      POLYPECTOMY Bilateral 06/26/2013   Procedure: BILATERAL EXCISION OF NASAL POLYPS ;  Surgeon: Lawence Press, MD;  Location: [facility];  Service: ENT;  Laterality: Bilateral;   SEPTOPLASTY N/A 06/26/2013   Procedure: AND SEPTOPLASTY;  Surgeon: Lawence Press, MD;  Location: [facility];  Service: ENT;  Laterality: N/A;   THYROIDECTOMY  09/06/1992   TONSILLECTOMY     TOTAL HIP ARTHROPLASTY  09/06/2004   right   TOTAL HIP ARTHROPLASTY  09/06/2004   left   TOTAL KNEE ARTHROPLASTY Right 12/13/2014   Procedure: RIGHT TOTAL KNEE ARTHROPLASTY;  Surgeon: Liliane Rei, MD;  Location: [facility];  Service: Orthopedics;  Laterality: Right;   TOTAL KNEE  ARTHROPLASTY Left 07/13/2021   Procedure: TOTAL KNEE ARTHROPLASTY;  Surgeon: Liliane Rei, MD;  Location: [facility];  Service: Orthopedics;  Laterality: Left;    Home Medications:  (Not in a hospital admission)   Allergies:  Allergies  Allergen Reactions   Feldene [Piroxicam] Other (See Comments)    CANT REMEMBER   Gabapentin Other (See Comments)    Gi upset   Ivp Dye [Iodinated Contrast Media] Other (See Comments)    CANT REMEMBER   Latex Other (See Comments)    Tickle nose   Sulfa Antibiotics     CANT REMEMBER   Naproxen Nausea And Vomiting   Vicodin [Hydrocodone-Acetaminophen ] Nausea And Vomiting    No family history on file.  Social History:  reports that she has never smoked. She has never used smokeless tobacco. She reports that she does not drink alcohol and does not use drugs.  ROS: A complete review of systems was performed.  All systems are negative except for pertinent findings as noted.  Physical Exam:  Vital signs in last 24 hours: @VSRANGES @ General:  Alert and oriented, No acute distress HEENT: Normocephalic, atraumatic Neck: No JVD or lymphadenopathy Cardiovascular: Regular rate  Lungs: Normal inspiratory/expiratory excursion Neurologic: Grossly intact  I have reviewed prior pt notes  I have reviewed notes from referring/previous physicians  I have reviewed urinalysis  results--clear   Impression/Assessment:  OAB with mixed incontinence, still fairly symptomatic  Plan:  I gave her samples of Gemtesa to take with the tolterodine   She will call us  in a couple of months if she would like to continue on the Gemtesa  Otherwise I will see back in 1 year

## 2024-01-24 ENCOUNTER — Ambulatory Visit: Payer: 59 | Admitting: Urology

## 2024-01-24 VITALS — BP 129/74 | HR 71

## 2024-01-24 DIAGNOSIS — N3281 Overactive bladder: Secondary | ICD-10-CM | POA: Diagnosis not present

## 2024-01-24 DIAGNOSIS — N3946 Mixed incontinence: Secondary | ICD-10-CM

## 2024-01-24 LAB — URINALYSIS, ROUTINE W REFLEX MICROSCOPIC
Bilirubin, UA: NEGATIVE
Glucose, UA: NEGATIVE
Ketones, UA: NEGATIVE
Leukocytes,UA: NEGATIVE
Nitrite, UA: NEGATIVE
Protein,UA: NEGATIVE
RBC, UA: NEGATIVE
Specific Gravity, UA: 1.01 (ref 1.005–1.030)
Urobilinogen, Ur: 0.2 mg/dL (ref 0.2–1.0)
pH, UA: 7 (ref 5.0–7.5)

## 2024-02-15 ENCOUNTER — Other Ambulatory Visit: Payer: Self-pay | Admitting: Urology

## 2024-02-15 ENCOUNTER — Telehealth: Payer: Self-pay | Admitting: Urology

## 2024-02-15 DIAGNOSIS — N3281 Overactive bladder: Secondary | ICD-10-CM

## 2024-02-15 MED ORDER — GEMTESA 75 MG PO TABS: 1.0000 | ORAL_TABLET | Freq: Every day | ORAL | 11 refills | Status: AC

## 2024-02-15 NOTE — Telephone Encounter (Signed)
 FYI and advise

## 2024-02-15 NOTE — Telephone Encounter (Signed)
 Dr D gave her Gemtesa and it is working great. She would like a RX and see if she can get approved. Send to [facility]

## 2024-02-16 NOTE — Telephone Encounter (Signed)
 Wants to know if we can do a tier reduction through insurance on Gemtesa. It is over $100

## 2024-02-20 DIAGNOSIS — E6609 Other obesity due to excess calories: Secondary | ICD-10-CM | POA: Diagnosis not present

## 2024-02-20 DIAGNOSIS — E039 Hypothyroidism, unspecified: Secondary | ICD-10-CM | POA: Diagnosis not present

## 2024-02-20 DIAGNOSIS — Z6837 Body mass index (BMI) 37.0-37.9, adult: Secondary | ICD-10-CM | POA: Diagnosis not present

## 2024-02-22 NOTE — Telephone Encounter (Signed)
 spoke with pt insurance to complete a tier exemption for Rx gemtesa  rep stated to fax over ov notes after receiving all needed information fax number was given and fax was sent

## 2024-02-22 NOTE — Telephone Encounter (Signed)
 Office notes efaxed to insurance Release: [id number]

## 2024-03-01 DIAGNOSIS — D225 Melanocytic nevi of trunk: Secondary | ICD-10-CM | POA: Diagnosis not present

## 2024-03-01 DIAGNOSIS — Z1283 Encounter for screening for malignant neoplasm of skin: Secondary | ICD-10-CM | POA: Diagnosis not present

## 2024-03-01 DIAGNOSIS — M792 Neuralgia and neuritis, unspecified: Secondary | ICD-10-CM | POA: Diagnosis not present

## 2024-03-01 DIAGNOSIS — B078 Other viral warts: Secondary | ICD-10-CM | POA: Diagnosis not present

## 2024-04-02 ENCOUNTER — Ambulatory Visit
Admission: EM | Admit: 2024-04-02 | Discharge: 2024-04-02 | Disposition: A | Discharge status: Home / Self Care | Attending: Nurse Practitioner | Admitting: Nurse Practitioner

## 2024-04-02 DIAGNOSIS — R21 Rash and other nonspecific skin eruption: Secondary | ICD-10-CM | POA: Diagnosis not present

## 2024-04-02 MED ORDER — TRIAMCINOLONE ACETONIDE 0.1 % EX CREA: 1.0000 | TOPICAL_CREAM | Freq: Two times a day (BID) | CUTANEOUS | 0 refills | Status: AC

## 2024-04-02 MED ORDER — DEXAMETHASONE SODIUM PHOSPHATE 10 MG/ML IJ SOLN
10.0000 mg | INTRAMUSCULAR | Status: AC
  Administered 2024-04-02: 10 mg via INTRAMUSCULAR

## 2024-04-02 MED ORDER — PREDNISONE 20 MG PO TABS: 40.0000 mg | ORAL_TABLET | Freq: Every day | ORAL | 0 refills | Status: AC

## 2024-04-02 NOTE — ED Triage Notes (Signed)
 Pt reports she has red itchy spots all over x 1 day    Used cortisone 10

## 2024-04-02 NOTE — ED Provider Notes (Signed)
 [facility]    CSN: [id number] Arrival date & time: 04/02/24  0948      History   Chief Complaint Chief Complaint  Patient presents with   Rash    HPI [name] is a 83 y.o. female.   The history is provided by the patient.   Patient presents with a 1 day history of a rash to the lower extremities, abdomen, and right shoulder.  Patient denies exposure to new soaps, medications, lotions, foods, or detergents.  States that she did attend a birthday celebration, and then later visited a friend in a nursing facility.  States that she walked around her neighborhood, but walked on concrete the entire time.  Patient states that the rash is itchy.  Further denies fever, chills, oozing, or drainage from the rash.  Patient states that she has taken Benadryl  and used cortisone 10 for her symptoms.  Past Medical History:  Diagnosis Date   Arthritis    Bladder spasms    spastic bladder wears pads   Cancer (HCC)    Skin cancer -scalp and eyebrowbasal cell   Complication of anesthesia    , woke up too early from surgery and pt had floppy fish syndrome   GERD (gastroesophageal reflux disease)    occ. frequent hiccoughs after eating, indigestion   Hyperlipemia    Hypertension    Hypothyroidism    Macular degeneration    Trigger finger, left    alternate fingers affected. -Occ. shooting pain.   Wears glasses     Patient Active Problem List   Diagnosis Date Noted   Primary osteoarthritis of left knee 07/13/2021   History of chest pain 03/20/2019   History of breast cancer 08/14/2018   Obesity (BMI 30-39.9) 08/14/2018   Bilateral lower extremity edema 02/14/2018   Essential hypertension 02/14/2018   Hyperlipidemia 02/14/2018   Family history of heart disease 02/14/2018   Onychomycosis 08/26/2016   OA (osteoarthritis) of knee 12/13/2014    Past Surgical History:  Procedure Laterality Date   ABDOMINAL HYSTERECTOMY  09/07/1987   APPENDECTOMY      CARDIAC CATHETERIZATION  09/06/2004   normal-done for work up pre hip surgeries   CARPAL TUNNEL RELEASE Left    CHOLECYSTECTOMY  09/07/1995   JOINT REPLACEMENT     BTHA   left breast lumpectomy      POLYPECTOMY Bilateral 06/26/2013   Procedure: BILATERAL EXCISION OF NASAL POLYPS ;  Surgeon: Ana LELON Moccasin, MD;  Location: [facility];  Service: ENT;  Laterality: Bilateral;   SEPTOPLASTY N/A 06/26/2013   Procedure: AND SEPTOPLASTY;  Surgeon: Ana LELON Moccasin, MD;  Location: [facility];  Service: ENT;  Laterality: N/A;   THYROIDECTOMY  09/06/1992   TONSILLECTOMY     TOTAL HIP ARTHROPLASTY  09/06/2004   right   TOTAL HIP ARTHROPLASTY  09/06/2004   left   TOTAL KNEE ARTHROPLASTY Right 12/13/2014   Procedure: RIGHT TOTAL KNEE ARTHROPLASTY;  Surgeon: Dempsey Moan, MD;  Location: [facility];  Service: Orthopedics;  Laterality: Right;   TOTAL KNEE ARTHROPLASTY Left 07/13/2021   Procedure: TOTAL KNEE ARTHROPLASTY;  Surgeon: Moan Dempsey, MD;  Location: [facility];  Service: Orthopedics;  Laterality: Left;    OB History   No obstetric history on file.      Home Medications    Prior to Admission medications   Medication Sig Start Date End Date Taking? Authorizing Provider  calcium citrate (CALCITRATE - DOSED IN MG ELEMENTAL CALCIUM) 950 (200 Ca)  MG tablet Take 2 tablets by mouth 2 (two) times daily.    [provider]  cholecalciferol (VITAMIN D3) 25 MCG (1000 UNIT) tablet Take 1,000 Units by mouth daily.    [provider]  hydrochlorothiazide  (MICROZIDE ) 12.5 MG capsule TAKE 1 CAPSULE BY MOUTH EVERY DAY 09/16/23   Court Dorn PARAS, MD  levothyroxine  (SYNTHROID , LEVOTHROID) 125 MCG tablet Take 125 mcg by mouth daily before breakfast.    [provider]  losartan  (COZAAR ) 50 MG tablet TAKE 1 TABLET BY MOUTH EVERY DAY 08/22/23   Court Dorn PARAS, MD  metoprolol  tartrate (LOPRESSOR ) 50 MG tablet Take 50 mg by mouth 2 (two) times daily.    [provider]  Multiple Vitamins-Minerals (PRESERVISION AREDS 2 PO) Take by mouth. Patient not taking: Reported on 01/24/2024    [provider]  Polyethyl Glycol-Propyl Glycol (SYSTANE) 0.4-0.3 % SOLN Place 1 application  into both eyes daily as needed (Dry eye). As needed    [provider]  simvastatin  (ZOCOR ) 10 MG tablet Take 10 mg by mouth at bedtime.    [provider]  tolterodine  (DETROL  LA) 4 MG 24 hr capsule TAKE 1 CAPSULE BY MOUTH EVERY DAY 12/19/23   Matilda Senior, MD  Vibegron  (GEMTESA ) 75 MG TABS Take 1 tablet (75 mg total) by mouth daily. 02/15/24   Matilda Senior, MD  Vitamins A & D (VITAMIN A & D) ointment Apply 1 application topically daily as needed (Gaulding (Skin Chafing)).    [provider]    Family History History reviewed. No pertinent family history.  Social History Social History   Tobacco Use   Smoking status: Never   Smokeless tobacco: Never  Vaping Use   Vaping status: Never Used  Substance Use Topics   Alcohol use: Never   Drug use: No     Allergies   Feldene [piroxicam], Gabapentin, Ivp dye [iodinated contrast media], Latex, Sulfa antibiotics, Naproxen, and Vicodin [hydrocodone-acetaminophen ]   Review of Systems Review of Systems Per HPI  Physical Exam Triage Vital Signs ED Triage Vitals  Encounter Vitals Group     BP 04/02/24 1017 122/78     Girls Systolic BP Percentile --      Girls Diastolic BP Percentile --      Boys Systolic BP Percentile --      Boys Diastolic BP Percentile --      Pulse Rate 04/02/24 1017 69     Resp 04/02/24 1017 18     Temp 04/02/24 1017 98.2 F (36.8 C)     Temp Source 04/02/24 1017 Oral     SpO2 04/02/24 1017 94 %     Weight --      Height --      Head Circumference --      Peak Flow --      Pain Score 04/02/24 1016 0     Pain Loc --      Pain Education --      Exclude from Growth Chart --    No data found.  Updated Vital Signs BP 122/78 (BP Location:  Right Arm)   Pulse 69   Temp 98.2 F (36.8 C) (Oral)   Resp 18   SpO2 94%   Visual Acuity Right Eye Distance:   Left Eye Distance:   Bilateral Distance:    Right Eye Near:   Left Eye Near:    Bilateral Near:     Physical Exam Vitals and nursing note reviewed.  Constitutional:  General: She is not in acute distress.    Appearance: Normal appearance.  HENT:     Head: Normocephalic.  Eyes:     Extraocular Movements: Extraocular movements intact.     Pupils: Pupils are equal, round, and reactive to light.  Cardiovascular:     Rate and Rhythm: Normal rate and regular rhythm.     Pulses: Normal pulses.     Heart sounds: Normal heart sounds.  Pulmonary:     Effort: Pulmonary effort is normal.     Breath sounds: Normal breath sounds.  Musculoskeletal:     Cervical back: Normal range of motion.  Skin:    General: Skin is warm and dry.     Findings: Rash present. Rash is macular and papular.     Comments: Erythematous maculopapular rash noted to the bilateral lower extremities.  1 papule noted to the right upper abdomen and to the right shoulder.  The rashes and no congruent pattern.  There is no oozing, fluctuance, or drainage present.  Neurological:     General: No focal deficit present.     Mental Status: She is alert and oriented to person, place, and time.  Psychiatric:        Mood and Affect: Mood normal.        Behavior: Behavior normal.      UC Treatments / Results  Labs (all labs ordered are listed, but only abnormal results are displayed) Labs Reviewed - No data to display  EKG   Radiology No results found.  Procedures Procedures (including critical care time)  Medications Ordered in UC Medications - No data to display  Initial Impression / Assessment and Plan / UC Course  I have reviewed the triage vital signs and the nursing notes.  Pertinent labs & imaging results that were available during my care of the patient were reviewed by me and  considered in my medical decision making (see chart for details).  Will treat rash with Decadron  10 mg IM to help with itching.  Differential diagnoses include contact dermatitis, shingles, or atopic dermatitis.  Will start prednisone  40 mg for the next 5 days, along with triamcinolone  cream 0.1% for patient to apply topically.  Supportive care recommendations were provided and discussed with the patient to include over-the-counter antihistamines, cool cloths to the affected areas, and avoiding hot baths or showers.  Discussed indications with patient regarding follow-up.  Patient was in agreement with this plan of care and verbalizes understanding.  All questions were answered.  Patient stable for discharge.  Final Clinical Impressions(s) / UC Diagnoses   Final diagnoses:  None   Discharge Instructions   None    ED Prescriptions   None    PDMP not reviewed this encounter.   Gilmer Etta PARAS, NP 04/02/24 1044

## 2024-04-02 NOTE — Discharge Instructions (Signed)
 You were given an injection of Decadron  10 mg.  Start the prednisone  on 04/03/2024. You may take over-the-counter Zyrtec, Claritin, or Allegra during the daytime and Benadryl  at bedtime to help with itching. Do not scratch or manipulate the areas while symptoms persist. Avoid hot baths or showers.  Recommend lukewarm baths while symptoms persist. Also recommend over-the-counter Aveeno Colloidal Oatmeal Bath to help with itching and drying of the rash. If symptoms fail to improve with this treatment, you may follow-up in this clinic or with your primary care physician for further evaluation. Follow-up as needed.

## 2024-04-19 ENCOUNTER — Telehealth: Payer: Self-pay | Admitting: Urology

## 2024-04-19 NOTE — Telephone Encounter (Signed)
 Having a reaction to new medication Gemtesa . Went to UC 3 weeks ago for rash and still has rash and itching.

## 2024-04-19 NOTE — Telephone Encounter (Signed)
**Note De-identified  [place] Obfuscation** Please advise 

## 2024-04-20 NOTE — Telephone Encounter (Signed)
 Called pt to see if she stopped taking Gemtesa  pt states she has not taken any in 3 days she does still have a rash but states the Rx works very well pt was advised of MD Dahlstedt recommendations pt states she will contact her dermatologist

## 2024-04-25 DIAGNOSIS — S80861A Insect bite (nonvenomous), right lower leg, initial encounter: Secondary | ICD-10-CM | POA: Diagnosis not present

## 2024-04-25 DIAGNOSIS — B078 Other viral warts: Secondary | ICD-10-CM | POA: Diagnosis not present

## 2024-05-14 DIAGNOSIS — E039 Hypothyroidism, unspecified: Secondary | ICD-10-CM | POA: Diagnosis not present

## 2024-05-14 DIAGNOSIS — E782 Mixed hyperlipidemia: Secondary | ICD-10-CM | POA: Diagnosis not present

## 2024-05-14 DIAGNOSIS — E7849 Other hyperlipidemia: Secondary | ICD-10-CM | POA: Diagnosis not present

## 2024-05-14 DIAGNOSIS — I10 Essential (primary) hypertension: Secondary | ICD-10-CM | POA: Diagnosis not present

## 2024-05-14 DIAGNOSIS — Z6837 Body mass index (BMI) 37.0-37.9, adult: Secondary | ICD-10-CM | POA: Diagnosis not present

## 2024-05-14 DIAGNOSIS — E6609 Other obesity due to excess calories: Secondary | ICD-10-CM | POA: Diagnosis not present

## 2024-06-28 DIAGNOSIS — L2989 Other pruritus: Secondary | ICD-10-CM | POA: Diagnosis not present

## 2024-06-28 DIAGNOSIS — B070 Plantar wart: Secondary | ICD-10-CM | POA: Diagnosis not present

## 2024-06-28 DIAGNOSIS — L821 Other seborrheic keratosis: Secondary | ICD-10-CM | POA: Diagnosis not present

## 2024-07-23 NOTE — Progress Notes (Unsigned)
 Impression/Assessment:  OAB with mixed incontinence, still fairly symptomatic  Plan:  I gave her samples of Gemtesa  to take with the tolterodine     History of Present Illness: [name] is a 83 y.o. year old female is here for continued f/u of OAB.  She has a long history of treatment prior to seeing us  here several years ago.  She has been on several medications, most recently tolterodine .  She tolerates this well.  She has had several rounds with [facility].  She still has urinary frequency and urgency.  Somewhat limited mobility.    Past Medical History:  Diagnosis Date   Arthritis    Bladder spasms    spastic bladder wears pads   Cancer (HCC)    Skin cancer -scalp and eyebrowbasal cell   Complication of anesthesia    , woke up too early from surgery and pt had floppy fish syndrome   GERD (gastroesophageal reflux disease)    occ. frequent hiccoughs after eating, indigestion   Hyperlipemia    Hypertension    Hypothyroidism    Macular degeneration    Trigger finger, left    alternate fingers affected. -Occ. shooting pain.   Wears glasses     Past Surgical History:  Procedure Laterality Date   ABDOMINAL HYSTERECTOMY  09/07/1987   APPENDECTOMY     CARDIAC CATHETERIZATION  09/06/2004   normal-done for work up pre hip surgeries   CARPAL TUNNEL RELEASE Left    CHOLECYSTECTOMY  09/07/1995   JOINT REPLACEMENT     BTHA   left breast lumpectomy      POLYPECTOMY Bilateral 06/26/2013   Procedure: BILATERAL EXCISION OF NASAL POLYPS ;  Surgeon: Ana LELON Moccasin, MD;  Location: [facility];  Service: ENT;  Laterality: Bilateral;   SEPTOPLASTY N/A 06/26/2013   Procedure: AND SEPTOPLASTY;  Surgeon: Ana LELON Moccasin, MD;  Location: [facility];  Service: ENT;  Laterality: N/A;   THYROIDECTOMY  09/06/1992   TONSILLECTOMY     TOTAL HIP ARTHROPLASTY  09/06/2004   right   TOTAL HIP ARTHROPLASTY  09/06/2004   left   TOTAL KNEE ARTHROPLASTY Right  12/13/2014   Procedure: RIGHT TOTAL KNEE ARTHROPLASTY;  Surgeon: Dempsey Moan, MD;  Location: [facility];  Service: Orthopedics;  Laterality: Right;   TOTAL KNEE ARTHROPLASTY Left 07/13/2021   Procedure: TOTAL KNEE ARTHROPLASTY;  Surgeon: Moan Dempsey, MD;  Location: [facility];  Service: Orthopedics;  Laterality: Left;    Home Medications:  (Not in a hospital admission)   Allergies:  Allergies  Allergen Reactions   Feldene [Piroxicam] Other (See Comments)    CANT REMEMBER   Gabapentin Other (See Comments)    Gi upset   Ivp Dye [Iodinated Contrast Media] Other (See Comments)    CANT REMEMBER   Latex Other (See Comments)    Tickle nose   Sulfa Antibiotics     CANT REMEMBER   Naproxen Nausea And Vomiting   Vicodin [Hydrocodone-Acetaminophen ] Nausea And Vomiting    No family history on file.  Social History:  reports that she has never smoked. She has never used smokeless tobacco. She reports that she does not drink alcohol and does not use drugs.  ROS: A complete review of systems was performed.  All systems are negative except for pertinent findings as noted.  Physical Exam:  Vital signs in last 24 hours: @VSRANGES @ General:  Alert and oriented, No acute distress HEENT: Normocephalic, atraumatic Neck: No JVD or lymphadenopathy Cardiovascular: Regular rate  Lungs: Normal inspiratory/expiratory excursion Neurologic: Grossly intact  I have reviewed prior pt notes  I have reviewed notes from referring/previous physicians  I have reviewed urinalysis results--clear

## 2024-07-24 ENCOUNTER — Ambulatory Visit: Admitting: Urology

## 2024-07-24 VITALS — BP 104/69 | HR 78

## 2024-07-24 DIAGNOSIS — N3946 Mixed incontinence: Secondary | ICD-10-CM

## 2024-07-24 DIAGNOSIS — N3281 Overactive bladder: Secondary | ICD-10-CM | POA: Diagnosis not present

## 2024-07-24 LAB — URINALYSIS, ROUTINE W REFLEX MICROSCOPIC
Bilirubin, UA: NEGATIVE
Glucose, UA: NEGATIVE
Ketones, UA: NEGATIVE
Leukocytes,UA: NEGATIVE
Nitrite, UA: NEGATIVE
Protein,UA: NEGATIVE
RBC, UA: NEGATIVE
Specific Gravity, UA: 1.015 (ref 1.005–1.030)
Urobilinogen, Ur: 0.2 mg/dL (ref 0.2–1.0)
pH, UA: 6 (ref 5.0–7.5)

## 2024-07-24 LAB — BLADDER SCAN AMB NON-IMAGING: Scan Result: 0

## 2024-07-24 NOTE — Progress Notes (Signed)
 Bladder Scan completed today due to reason of OAB  Patient can void prior to the bladder scan. Bladder scan result: 0  Performed By: Exie T. CMA  Additional notes- Patient is scheduled to follow up with MD

## 2024-07-25 ENCOUNTER — Encounter (HOSPITAL_COMMUNITY): Payer: Self-pay

## 2024-07-25 ENCOUNTER — Emergency Department (HOSPITAL_COMMUNITY)
Admission: EM | Admit: 2024-07-25 | Discharge: 2024-07-25 | Disposition: A | Discharge status: Home / Self Care | Attending: Emergency Medicine | Admitting: Emergency Medicine

## 2024-07-25 ENCOUNTER — Emergency Department (HOSPITAL_COMMUNITY)

## 2024-07-25 ENCOUNTER — Other Ambulatory Visit: Payer: Self-pay

## 2024-07-25 DIAGNOSIS — W133XXA Fall through floor, initial encounter: Secondary | ICD-10-CM | POA: Insufficient documentation

## 2024-07-25 DIAGNOSIS — R55 Syncope and collapse: Secondary | ICD-10-CM

## 2024-07-25 DIAGNOSIS — Z79899 Other long term (current) drug therapy: Secondary | ICD-10-CM | POA: Diagnosis not present

## 2024-07-25 DIAGNOSIS — Z91040 Latex allergy status: Secondary | ICD-10-CM | POA: Diagnosis not present

## 2024-07-25 DIAGNOSIS — I10 Essential (primary) hypertension: Secondary | ICD-10-CM | POA: Insufficient documentation

## 2024-07-25 DIAGNOSIS — I959 Hypotension, unspecified: Secondary | ICD-10-CM | POA: Diagnosis not present

## 2024-07-25 DIAGNOSIS — R42 Dizziness and giddiness: Secondary | ICD-10-CM | POA: Insufficient documentation

## 2024-07-25 DIAGNOSIS — S0990XA Unspecified injury of head, initial encounter: Secondary | ICD-10-CM | POA: Diagnosis not present

## 2024-07-25 LAB — CBC WITH DIFFERENTIAL/PLATELET
Abs Immature Granulocytes: 0.03 K/uL (ref 0.00–0.07)
Basophils Absolute: 0 K/uL (ref 0.0–0.1)
Basophils Relative: 0 %
Eosinophils Absolute: 0.2 K/uL (ref 0.0–0.5)
Eosinophils Relative: 2 %
HCT: 42.2 % (ref 36.0–46.0)
Hemoglobin: 13.3 g/dL (ref 12.0–15.0)
Immature Granulocytes: 0 %
Lymphocytes Relative: 8 %
Lymphs Abs: 0.8 K/uL (ref 0.7–4.0)
MCH: 28.1 pg (ref 26.0–34.0)
MCHC: 31.5 g/dL (ref 30.0–36.0)
MCV: 89 fL (ref 80.0–100.0)
Monocytes Absolute: 0.9 K/uL (ref 0.1–1.0)
Monocytes Relative: 9 %
Neutro Abs: 7.7 K/uL (ref 1.7–7.7)
Neutrophils Relative %: 81 %
Platelets: 238 K/uL (ref 150–400)
RBC: 4.74 MIL/uL (ref 3.87–5.11)
RDW: 15.1 % (ref 11.5–15.5)
WBC: 9.5 K/uL (ref 4.0–10.5)
nRBC: 0 % (ref 0.0–0.2)

## 2024-07-25 LAB — BASIC METABOLIC PANEL WITH GFR
Anion gap: 9 (ref 5–15)
BUN: 18 mg/dL (ref 8–23)
CO2: 27 mmol/L (ref 22–32)
Calcium: 9.2 mg/dL (ref 8.9–10.3)
Chloride: 105 mmol/L (ref 98–111)
Creatinine, Ser: 0.83 mg/dL (ref 0.44–1.00)
GFR, Estimated: >60 mL/min (ref >60)
Glucose, Bld: 112 mg/dL — ABNORMAL HIGH (ref 70–99)
Potassium: 4.2 mmol/L (ref 3.5–5.1)
Sodium: 140 mmol/L (ref 135–145)

## 2024-07-25 LAB — TROPONIN T, HIGH SENSITIVITY: Troponin T High Sensitivity: <15 ng/L (ref 0–19)

## 2024-07-25 MED ORDER — SODIUM CHLORIDE 0.9 % IV BOLUS
1000.0000 mL | Freq: Once | INTRAVENOUS | Status: AC
  Administered 2024-07-25: 1000 mL via INTRAVENOUS

## 2024-07-25 NOTE — ED Triage Notes (Signed)
 Pt states that she woke up this morning and felt dizzy when she stood up and fell. Not c/o any pain, no thinners, may have hit head not sure. Pt with positive orthostatics.

## 2024-07-25 NOTE — ED Provider Notes (Signed)
  Physical Exam  BP (!) 115/56   Pulse 82   Temp 98.3 F (36.8 C)   Resp 14   SpO2 98%   Physical Exam  Procedures  Procedures  ED Course / MDM    Medical Decision Making Amount and/or Complexity of Data Reviewed Labs: ordered. Radiology: ordered.   I assumed care of this patient from the earlier provider Dr. Georgette Able.  Briefly this is an 83 year old female with a history of hypertension who presented to the ED with an episode of near syncope, or lightheadedness when she stood up.  She reportedly had positive orthostatic vital signs for EMS, but normal ortho VS on arrival here.  Her initial labs including troponin level are unremarkable.  She is pending x-ray of the chest and CT scan of the head to evaluate for near syncope.  EKG does not show any acute ischemic findings.  She received 1 liter IV fluids at the behest of earlier ED provider  No emergent findings on radiology images  Suspect this was orthostatic hypotension at home.  Her BP was 104 in the office yesterday at urology. I think she is overmedicated, with metoprolol  50 mg BID, losartan  and hydrochlorothiazide .  I recommended stopping hydrochlorothiazide  and she is in agreement.  She has cardiology follow up next month.    Cottie Donnice PARAS, MD 07/25/24 [id number]

## 2024-07-25 NOTE — ED Provider Notes (Signed)
 [place] [facility] AT [facility] Provider Note   CSN: [id number] Arrival date & time: 07/25/24  9398     Patient presents with: Fall and Dizziness   [name] is a 83 y.o. female.   Patient is an 83 year old female with history of osteoarthritis, hypertension, hyperlipidemia.  Patient presenting today with complaints of a fall.  She was sleeping in her recliner and stood up to walk across the room when she suddenly felt weak and fell to the floor.  She denies having injured herself.  According to the patient, EMS informed her that her blood pressures were up, then down while she was being transported.  She arrives here normotensive and in no distress.  She denies injury from the fall.       Prior to Admission medications   Medication Sig Start Date End Date Taking? Authorizing Provider  calcium citrate (CALCITRATE - DOSED IN MG ELEMENTAL CALCIUM) 950 (200 Ca) MG tablet Take 2 tablets by mouth 2 (two) times daily.    [provider]  cholecalciferol (VITAMIN D3) 25 MCG (1000 UNIT) tablet Take 1,000 Units by mouth daily.    [provider]  hydrochlorothiazide  (MICROZIDE ) 12.5 MG capsule TAKE 1 CAPSULE BY MOUTH EVERY DAY 09/16/23   Court Dorn PARAS, MD  levothyroxine  (SYNTHROID , LEVOTHROID) 125 MCG tablet Take 125 mcg by mouth daily before breakfast.    [provider]  losartan  (COZAAR ) 50 MG tablet TAKE 1 TABLET BY MOUTH EVERY DAY 08/22/23   Court Dorn PARAS, MD  metoprolol  tartrate (LOPRESSOR ) 50 MG tablet Take 50 mg by mouth 2 (two) times daily.    [provider]  Multiple Vitamins-Minerals (PRESERVISION AREDS 2 PO) Take by mouth. Patient not taking: Reported on 07/24/2024    [provider]  Polyethyl Glycol-Propyl Glycol (SYSTANE) 0.4-0.3 % SOLN Place 1 application  into both eyes daily as needed (Dry eye). As needed    [provider]  simvastatin  (ZOCOR ) 10 MG tablet Take 10 mg by mouth at  bedtime.    [provider]  tolterodine  (DETROL  LA) 4 MG 24 hr capsule TAKE 1 CAPSULE BY MOUTH EVERY DAY 12/19/23   Matilda Senior, MD  triamcinolone  cream (KENALOG ) 0.1 % Apply 1 Application topically 2 (two) times daily. 04/02/24   Leath-Warren, Etta PARAS, NP  Vibegron  (GEMTESA ) 75 MG TABS Take 1 tablet (75 mg total) by mouth daily. 02/15/24   Matilda Senior, MD  Vitamins A & D (VITAMIN A & D) ointment Apply 1 application topically daily as needed (Gaulding (Skin Chafing)).    [provider]    Allergies: Feldene [piroxicam], Gabapentin, Ivp dye [iodinated contrast media], Latex, Sulfa antibiotics, Naproxen, and Vicodin [hydrocodone-acetaminophen ]    Review of Systems  All other systems reviewed and are negative.   Updated Vital Signs BP (!) 115/56   Pulse 82   Temp 98.3 F (36.8 C)   Resp 14   SpO2 98%   Physical Exam Vitals and nursing note reviewed.  Constitutional:      General: She is not in acute distress.    Appearance: She is well-developed. She is not diaphoretic.  HENT:     Head: Normocephalic and atraumatic.  Eyes:     Extraocular Movements: Extraocular movements intact.     Pupils: Pupils are equal, round, and reactive to light.  Cardiovascular:     Rate and Rhythm: Normal rate and regular rhythm.     Heart sounds: No murmur heard.  No friction rub. No gallop.  Pulmonary:     Effort: Pulmonary effort is normal. No respiratory distress.     Breath sounds: Normal breath sounds. No wheezing.  Abdominal:     General: Bowel sounds are normal. There is no distension.     Palpations: Abdomen is soft.     Tenderness: There is no abdominal tenderness.  Musculoskeletal:        General: Normal range of motion.     Cervical back: Normal range of motion and neck supple.  Skin:    General: Skin is warm and dry.  Neurological:     General: No focal deficit present.     Mental Status: She is alert and oriented to person, place, and time.  Mental status is at baseline.     Cranial Nerves: No cranial nerve deficit.     Motor: No weakness.     (all labs ordered are listed, but only abnormal results are displayed) Labs Reviewed - No data to display  EKG: EKG Interpretation Date/Time:  Wednesday July 25 2024 06:39:45 EST Ventricular Rate:  81 PR Interval:  200 QRS Duration:  102 QT Interval:  374 QTC Calculation: 435 R Axis:   -9  Text Interpretation: Sinus rhythm Probable left ventricular hypertrophy Confirmed by Geroldine Berg ([id number]) on 07/25/2024 6:45:12 AM  Radiology: No results found.   Procedures   Medications Ordered in the ED - No data to display                                  Medical Decision Making Amount and/or Complexity of Data Reviewed Labs: ordered. Radiology: ordered.   Patient is an 83 year old female presenting with complaints of a fall.  She stood up from her recliner and attempted to walk across the room when she ended up on the floor.  She is uncertain as to exactly what transpired, but denies having struck her head or lost consciousness.  She was told by EMS her blood pressure was high, then low and that she may be orthostatic.  Patient arrives here with stable vital signs and is afebrile.  There is no orthostasis in her blood pressures.  Laboratory studies obtained including CBC and basic metabolic panel, both of which are unremarkable.  Patient is currently receiving IV fluids and will be ambulated afterward.  I suspect possibly a vasovagal episode, but her blood pressures here seem fine and she is not orthostatic.  If patient is able to ambulate and remains with stable vital signs, anticipate discharge.  Care will be signed out to oncoming provider for reassessment after IV fluids.     Final diagnoses:  None    ED Discharge Orders     None          Geroldine Berg, MD 07/26/24 0004

## 2024-07-27 DIAGNOSIS — Z96651 Presence of right artificial knee joint: Secondary | ICD-10-CM | POA: Diagnosis not present

## 2024-07-27 DIAGNOSIS — Z96643 Presence of artificial hip joint, bilateral: Secondary | ICD-10-CM | POA: Diagnosis not present

## 2024-07-27 DIAGNOSIS — Z96653 Presence of artificial knee joint, bilateral: Secondary | ICD-10-CM | POA: Diagnosis not present

## 2024-07-27 DIAGNOSIS — Z96652 Presence of left artificial knee joint: Secondary | ICD-10-CM | POA: Diagnosis not present

## 2024-08-01 ENCOUNTER — Ambulatory Visit
Admission: EM | Admit: 2024-08-01 | Discharge: 2024-08-01 | Disposition: A | Discharge status: Home / Self Care | Attending: Nurse Practitioner | Admitting: Nurse Practitioner

## 2024-08-01 DIAGNOSIS — L304 Erythema intertrigo: Secondary | ICD-10-CM

## 2024-08-01 MED ORDER — KETOCONAZOLE 2 % EX CREA: 1.0000 | TOPICAL_CREAM | Freq: Two times a day (BID) | CUTANEOUS | 0 refills | Status: AC

## 2024-08-01 NOTE — ED Provider Notes (Signed)
 [facility]    CSN: [id number] Arrival date & time: 08/01/24  1705      History   Chief Complaint Chief Complaint  Patient presents with   Rash    HPI [name] is a 83 y.o. female.   The history is provided by the patient.   Patient presents for complaints of rash under the left arm.  Patient states she noticed the rash on yesterday.  She states that the rash has been itchy from time to time.  She denies exposure to new soaps, medications, lotions, foods, or detergents.  She states that she does believe that she bought a new deodorant.  She further denies fever, chills, oozing, or drainage from the site.  Patient has tried baby powder and mupirocin  with minimal relief of her symptoms.  Past Medical History:  Diagnosis Date   Arthritis    Bladder spasms    spastic bladder wears pads   Cancer (HCC)    Skin cancer -scalp and eyebrowbasal cell   Complication of anesthesia    , woke up too early from surgery and pt had floppy fish syndrome   GERD (gastroesophageal reflux disease)    occ. frequent hiccoughs after eating, indigestion   Hyperlipemia    Hypertension    Hypothyroidism    Macular degeneration    Trigger finger, left    alternate fingers affected. -Occ. shooting pain.   Wears glasses     Patient Active Problem List   Diagnosis Date Noted   Primary osteoarthritis of left knee 07/13/2021   History of chest pain 03/20/2019   History of breast cancer 08/14/2018   Obesity (BMI 30-39.9) 08/14/2018   Bilateral lower extremity edema 02/14/2018   Essential hypertension 02/14/2018   Hyperlipidemia 02/14/2018   Family history of heart disease 02/14/2018   Onychomycosis 08/26/2016   OA (osteoarthritis) of knee 12/13/2014    Past Surgical History:  Procedure Laterality Date   ABDOMINAL HYSTERECTOMY  09/07/1987   APPENDECTOMY     CARDIAC CATHETERIZATION  09/06/2004   normal-done for work up pre hip surgeries   CARPAL TUNNEL RELEASE Left     CHOLECYSTECTOMY  09/07/1995   JOINT REPLACEMENT     BTHA   left breast lumpectomy      POLYPECTOMY Bilateral 06/26/2013   Procedure: BILATERAL EXCISION OF NASAL POLYPS ;  Surgeon: Ana LELON Moccasin, MD;  Location: [facility];  Service: ENT;  Laterality: Bilateral;   SEPTOPLASTY N/A 06/26/2013   Procedure: AND SEPTOPLASTY;  Surgeon: Ana LELON Moccasin, MD;  Location: [facility];  Service: ENT;  Laterality: N/A;   THYROIDECTOMY  09/06/1992   TONSILLECTOMY     TOTAL HIP ARTHROPLASTY  09/06/2004   right   TOTAL HIP ARTHROPLASTY  09/06/2004   left   TOTAL KNEE ARTHROPLASTY Right 12/13/2014   Procedure: RIGHT TOTAL KNEE ARTHROPLASTY;  Surgeon: Dempsey Moan, MD;  Location: [facility];  Service: Orthopedics;  Laterality: Right;   TOTAL KNEE ARTHROPLASTY Left 07/13/2021   Procedure: TOTAL KNEE ARTHROPLASTY;  Surgeon: Moan Dempsey, MD;  Location: [facility];  Service: Orthopedics;  Laterality: Left;    OB History   No obstetric history on file.      Home Medications    Prior to Admission medications   Medication Sig Start Date End Date Taking? Authorizing Provider  cholecalciferol (VITAMIN D3) 25 MCG (1000 UNIT) tablet Take 1,000 Units by mouth daily.   Yes [provider]  levothyroxine  (SYNTHROID , LEVOTHROID) 125 MCG tablet  Take 125 mcg by mouth daily before breakfast.   Yes [provider]  losartan  (COZAAR ) 50 MG tablet TAKE 1 TABLET BY MOUTH EVERY DAY 08/22/23  Yes Court Dorn PARAS, MD  metoprolol  tartrate (LOPRESSOR ) 50 MG tablet Take 50 mg by mouth 2 (two) times daily.   Yes [provider]  Multiple Vitamins-Minerals (PRESERVISION AREDS 2 PO) Take by mouth.   Yes [provider]  simvastatin  (ZOCOR ) 10 MG tablet Take 10 mg by mouth at bedtime.   Yes [provider]  tolterodine  (DETROL  LA) 4 MG 24 hr capsule TAKE 1 CAPSULE BY MOUTH EVERY DAY 12/19/23  Yes Dahlstedt, Garnette, MD  Vibegron  (GEMTESA ) 75 MG TABS Take 1 tablet  (75 mg total) by mouth daily. 02/15/24  Yes Dahlstedt, Garnette, MD  calcium citrate (CALCITRATE - DOSED IN MG ELEMENTAL CALCIUM) 950 (200 Ca) MG tablet Take 2 tablets by mouth 2 (two) times daily.    [provider]  Polyethyl Glycol-Propyl Glycol (SYSTANE) 0.4-0.3 % SOLN Place 1 application  into both eyes daily as needed (Dry eye). As needed    [provider]  triamcinolone  cream (KENALOG ) 0.1 % Apply 1 Application topically 2 (two) times daily. 04/02/24   Leath-Warren, Etta PARAS, NP  Vitamins A & D (VITAMIN A & D) ointment Apply 1 application topically daily as needed (Gaulding (Skin Chafing)).    [provider]    Family History History reviewed. No pertinent family history.  Social History Social History   Tobacco Use   Smoking status: Never   Smokeless tobacco: Never  Vaping Use   Vaping status: Never Used  Substance Use Topics   Alcohol use: Never   Drug use: No     Allergies   Feldene [piroxicam], Gabapentin, Ivp dye [iodinated contrast media], Latex, Sulfa antibiotics, Naproxen, and Vicodin [hydrocodone-acetaminophen ]   Review of Systems Review of Systems Per HPI  Physical Exam Triage Vital Signs ED Triage Vitals  Encounter Vitals Group     BP 08/01/24 1724 (!) 144/76     Girls Systolic BP Percentile --      Girls Diastolic BP Percentile --      Boys Systolic BP Percentile --      Boys Diastolic BP Percentile --      Pulse Rate 08/01/24 1724 73     Resp 08/01/24 1724 18     Temp 08/01/24 1724 97.6 F (36.4 C)     Temp Source 08/01/24 1724 Oral     SpO2 08/01/24 1724 94 %     Weight --      Height --      Head Circumference --      Peak Flow --      Pain Score 08/01/24 1725 0     Pain Loc --      Pain Education --      Exclude from Growth Chart --    No data found.  Updated Vital Signs BP (!) 144/76 (BP Location: Right Arm)   Pulse 73   Temp 97.6 F (36.4 C) (Oral)   Resp 18   SpO2 94%   Visual Acuity Right Eye  Distance:   Left Eye Distance:   Bilateral Distance:    Right Eye Near:   Left Eye Near:    Bilateral Near:     Physical Exam Vitals and nursing note reviewed.  Constitutional:      General: She is not in acute distress.    Appearance: Normal appearance.  HENT:  Head: Normocephalic.  Eyes:     Extraocular Movements: Extraocular movements intact.     Pupils: Pupils are equal, round, and reactive to light.  Cardiovascular:     Rate and Rhythm: Normal rate and regular rhythm.     Pulses: Normal pulses.     Heart sounds: Normal heart sounds.  Pulmonary:     Effort: Pulmonary effort is normal.     Breath sounds: Normal breath sounds.  Musculoskeletal:     Cervical back: Normal range of motion.  Skin:    General: Skin is warm and dry.     Findings: Rash present.     Comments: Moist, erythematous, dyspigmented, patch under the left axilla. There is no oozing, fluctuance or drainage present  Neurological:     General: No focal deficit present.     Mental Status: She is alert and oriented to person, place, and time.  Psychiatric:        Mood and Affect: Mood normal.        Behavior: Behavior normal.      UC Treatments / Results  Labs (all labs ordered are listed, but only abnormal results are displayed) Labs Reviewed - No data to display  EKG   Radiology No results found.  Procedures Procedures (including critical care time)  Medications Ordered in UC Medications - No data to display  Initial Impression / Assessment and Plan / UC Course  I have reviewed the triage vital signs and the nursing notes.  Pertinent labs & imaging results that were available during my care of the patient were reviewed by me and considered in my medical decision making (see chart for details).  Patient's symptoms consistent with intertrigo.  Will treat with ketoconazole  2% for patient to apply topically.  Supportive care recommendations were provided and discussed with the patient to  include keeping the area clean and dry, applying cool compresses as needed, and to monitor for worsening symptoms.  Patient was given indications regarding follow-up.  Patient was in agreement with this plan of care and verbalizes understanding.  All questions were answered.  Patient stable for discharge.   Final Clinical Impressions(s) / UC Diagnoses   Final diagnoses:  None   Discharge Instructions   None    ED Prescriptions   None    PDMP not reviewed this encounter.   Gilmer Etta PARAS, NP 08/01/24 1750

## 2024-08-01 NOTE — Discharge Instructions (Signed)
 Apply medication as prescribed.  As discussed, if the area becomes more red and inflamed, you can use the triamcinolone  cream that you have at home with the antifungal cream. Keep the area under your left arm clean and dry. Avoid scratching or manipulating the areas while symptoms persist. Avoid use of deodorant until symptoms improve. Apply cool compresses to the area as needed for itching or inflammation. If symptoms fail to improve over the next 5 to 7 days, or begin to worsen, you may follow-up in this clinic or with your primary care physician for further evaluation. Follow-up as needed.

## 2024-08-01 NOTE — ED Triage Notes (Signed)
 Red Rash under left arm. Tried baby powder with no relief of symptoms. Pt states she has also been putting mupirocin  on it.

## 2024-08-07 ENCOUNTER — Telehealth: Payer: Self-pay

## 2024-08-07 NOTE — Telephone Encounter (Signed)
 tolterodine  (DETROL  LA) 4 MG 24 hr capsule  Vibegron  (GEMTESA ) 75 MG TABS   Combination is not working for incontinence and frequent urination.  Patient was calling to let Dr. Matilda know.  Please advise.  Call:  [phone]

## 2024-08-08 NOTE — Telephone Encounter (Signed)
 Return call to pt. Making her aware a message will be sent to Dr. Matilda on medication management. Pt voiced understanding.

## 2024-08-09 ENCOUNTER — Other Ambulatory Visit: Payer: Self-pay | Admitting: Urology

## 2024-08-09 DIAGNOSIS — N3281 Overactive bladder: Secondary | ICD-10-CM

## 2024-08-09 MED ORDER — TROSPIUM CHLORIDE ER 60 MG PO CP24: 1.0000 | ORAL_CAPSULE | Freq: Every day | ORAL | 11 refills | Status: AC

## 2024-08-10 NOTE — Telephone Encounter (Signed)
 Pt was made aware of Dr. Matilda  recommendation  and voiced understanding I sent in a new med to replace the tolterodine . She can continue the Gemtesa 

## 2024-08-21 ENCOUNTER — Encounter: Payer: Self-pay | Admitting: Cardiovascular Disease

## 2024-08-21 ENCOUNTER — Ambulatory Visit: Admitting: Cardiovascular Disease

## 2024-08-21 VITALS — BP 162/80 | HR 66 | Ht 61.0 in | Wt 214.0 lb

## 2024-08-21 DIAGNOSIS — E782 Mixed hyperlipidemia: Secondary | ICD-10-CM

## 2024-08-21 DIAGNOSIS — I10 Essential (primary) hypertension: Secondary | ICD-10-CM

## 2024-08-21 NOTE — Progress Notes (Signed)
 08/21/2024 [name]   03-14-1941  [id number]  Primary Physician Marvine Rush, MD Primary Cardiologist: Dorn JINNY Lesches MD [facility] [place], [place]  HPI:  [name] is a 83 y.o. female mildly overweight single Caucasian female with no children referred by Morene Sous, PA-C to evaluate bilateral lower extremity edema.  I last saw her in the office 08/22/2023.  Her mother [name] was also a patient of mine who unfortunately passed away at age 60 on 04-23-2020.  She does have a history of treated hypertension and hyperlipidemia.  She is never had a heart attack or stroke.  Her mother does have CAD and she is my patient.  She denies chest pain or shortness of breath.  She is had lower extremity edema for the last several weeks with a recent BMP drawn by her PCP of 144.  She was begun on as needed Lasix which has improved her swelling somewhat.   She developed some chest pain occurring several times a week lasting minutes at a time.  I did perform Myoview  stress testing 04/04/2019 which was low risk and nonischemic.   She underwent left total knee replacement by Dr. Melodi 07/13/2021 which she is cleared for at low risk.  She has had bilateral total hip replacements back in 06 and right total knee replacement in 2016.  She underwent successful left total knee replacement 07/13/2021.   Since I saw her a year ago she is remained stable.  She denies chest pain or shortness of breath.  She was seen in the ER several times, twice for rash and once for presyncope and orthostasis.  The ER physician discontinue her hydrochlorothiazide  at that time.   Active Medications[1]   Allergies[2]  Social History   Socioeconomic History   Marital status: Single    Spouse name: Not on file   Number of children: Not on file   Years of education: Not on file   Highest education level: Not on file  Occupational History   Not on file  Tobacco Use   Smoking status: Never   Smokeless tobacco:  Never  Vaping Use   Vaping status: Never Used  Substance and Sexual Activity   Alcohol use: Never   Drug use: No   Sexual activity: Not Currently  Other Topics Concern   Not on file  Social History Narrative   Not on file   Social Drivers of Health   Tobacco Use: Low Risk (08/01/2024)   Patient History    Smoking Tobacco Use: Never    Smokeless Tobacco Use: Never    Passive Exposure: Not on file  Financial Resource Strain: Low Risk (05/07/2022)   Overall Financial Resource Strain (CARDIA)    Difficulty of Paying Living Expenses: Not hard at all  Food Insecurity: Not on file  Transportation Needs: No Transportation Needs (05/07/2022)   PRAPARE - Administrator, Civil Service (Medical): No    Lack of Transportation (Non-Medical): No  Physical Activity: Not on file  Stress: Not on file  Social Connections: Not on file  Intimate Partner Violence: Not on file  Depression (EYV7-0): Not on file  Alcohol Screen: Not on file  Housing: Low Risk (05/07/2022)   Housing    Last Housing Risk Score: 0  Utilities: Not on file  Health Literacy: Not on file     Review of Systems: General: negative for chills, fever, night sweats or weight changes.  Cardiovascular: negative for chest pain, dyspnea on  exertion, edema, orthopnea, palpitations, paroxysmal nocturnal dyspnea or shortness of breath Dermatological: negative for rash Respiratory: negative for cough or wheezing Urologic: negative for hematuria Abdominal: negative for nausea, vomiting, diarrhea, bright red blood per rectum, melena, or hematemesis Neurologic: negative for visual changes, syncope, or dizziness All other systems reviewed and are otherwise negative except as noted above.    Blood pressure (!) 160/82, pulse 66, height 5' 1 (1.549 m), weight 214 lb (97.1 kg), SpO2 97%.  General appearance: alert and no distress Neck: no adenopathy, no carotid bruit, no JVD, supple, symmetrical, trachea midline, and thyroid   not enlarged, symmetric, no tenderness/mass/nodules Lungs: clear to auscultation bilaterally Heart: regular rate and rhythm, S1, S2 normal, no murmur, click, rub or gallop Extremities: extremities normal, atraumatic, no cyanosis or edema Pulses: 2+ and symmetric Skin: Skin color, texture, turgor normal. No rashes or lesions Neurologic: Grossly normal  EKG not performed today      ASSESSMENT AND PLAN:   Essential hypertension History of essential hypertension blood pressure measured today at 160/82.  She is on losartan  and metoprolol .  She was also on low-dose hydrochlorothiazide  which was discontinued in the emergency room when she presented with presyncope and orthostasis.  Hyperlipidemia History of hyperlipidemia on statin therapy with lipid profile performed 05/14/2024 revealing total cholesterol 170, LDL 78 and HDL 45.     Dorn DOROTHA Lesches MD FACP,FACC,FAHA, FSCAI 08/21/2024 9:58 AM    [1]  Current Meds  Medication Sig   cholecalciferol (VITAMIN D3) 25 MCG (1000 UNIT) tablet Take 1,000 Units by mouth daily.   ketoconazole  (NIZORAL ) 2 % cream Apply 1 Application topically 2 (two) times daily.   levothyroxine  (SYNTHROID , LEVOTHROID) 125 MCG tablet Take 125 mcg by mouth daily before breakfast.   losartan  (COZAAR ) 50 MG tablet TAKE 1 TABLET BY MOUTH EVERY DAY   metoprolol  tartrate (LOPRESSOR ) 50 MG tablet Take 50 mg by mouth 2 (two) times daily.   Multiple Vitamins-Minerals (PRESERVISION AREDS 2 PO) Take by mouth.   simvastatin  (ZOCOR ) 10 MG tablet Take 10 mg by mouth at bedtime.   triamcinolone  cream (KENALOG ) 0.1 % Apply 1 Application topically 2 (two) times daily.   Trospium  Chloride 60 MG CP24 Take 1 capsule (60 mg total) by mouth daily.   Vibegron  (GEMTESA ) 75 MG TABS Take 1 tablet (75 mg total) by mouth daily.   Vitamins A & D (VITAMIN A & D) ointment Apply 1 application topically daily as needed (Gaulding (Skin Chafing)).  [2]  Allergies Allergen Reactions   Feldene  [Piroxicam] Other (See Comments)    CANT REMEMBER   Gabapentin Other (See Comments)    Gi upset   Ivp Dye [Iodinated Contrast Media] Other (See Comments)    CANT REMEMBER   Latex Other (See Comments)    Tickle nose   Sulfa Antibiotics     CANT REMEMBER   Naproxen Nausea And Vomiting   Vicodin [Hydrocodone-Acetaminophen ] Nausea And Vomiting

## 2024-08-21 NOTE — Patient Instructions (Addendum)
 Medication Instructions:  Your physician recommends that you continue on your current medications as directed. Please refer to the Current Medication list given to you today.  *If you need a refill on your cardiac medications before your next appointment, please call your pharmacy*   Follow-Up: At [facility], you and your health needs are our priority.  As part of our continuing mission to provide you with exceptional heart care, our providers are all part of one team.  This team includes your primary Cardiologist (physician) and Advanced Practice Providers or APPs (Physician Assistants and Nurse Practitioners) who all work together to provide you with the care you need, when you need it.  Your next appointment:   12 month(s)  Provider:   Dorn Lesches, MD    We recommend signing up for the patient portal called MyChart.  Sign up information is provided on this After Visit Summary.  MyChart is used to connect with patients for Virtual Visits (Telemedicine).  Patients are able to view lab/test results, encounter notes, upcoming appointments, etc.  Non-urgent messages can be sent to your provider as well.   To learn more about what you can do with MyChart, go to [email or web address].   Other Instructions Dr. Jacqulyn Ahle [facility] [address] [place], [place] [address] [phone]   Need help finding a primary care provider?  [email or web address]  or  Physician referral line [phone]).

## 2024-08-21 NOTE — Assessment & Plan Note (Signed)
 History of hyperlipidemia on statin therapy with lipid profile performed 05/14/2024 revealing total cholesterol 170, LDL 78 and HDL 45.

## 2024-08-21 NOTE — Addendum Note (Signed)
 Addended by: LORRENE FEDERICO CROME on: 08/21/2024 10:13 AM   Modules accepted: Orders

## 2024-08-21 NOTE — Assessment & Plan Note (Signed)
 History of essential hypertension blood pressure measured today at 160/82.  She is on losartan  and metoprolol .  She was also on low-dose hydrochlorothiazide  which was discontinued in the emergency room when she presented with presyncope and orthostasis.

## 2024-09-06 ENCOUNTER — Other Ambulatory Visit: Payer: Self-pay | Admitting: Cardiovascular Disease

## 2025-01-29 ENCOUNTER — Ambulatory Visit: Admitting: Urology

## 2025-07-17 ENCOUNTER — Ambulatory Visit: Admitting: Urology
